# Patient Record
Sex: Male | Born: 1965
Health system: Southern US, Community
[De-identification: ages and names within clinical notes are randomized; demographics above are authoritative.]

## PROBLEM LIST (undated history)

## (undated) DIAGNOSIS — C649 Malignant neoplasm of unspecified kidney, except renal pelvis: Secondary | ICD-10-CM

## (undated) DIAGNOSIS — T884XXA Failed or difficult intubation, initial encounter: Secondary | ICD-10-CM

## (undated) DIAGNOSIS — K219 Gastro-esophageal reflux disease without esophagitis: Secondary | ICD-10-CM

## (undated) DIAGNOSIS — C099 Malignant neoplasm of tonsil, unspecified: Principal | ICD-10-CM

## (undated) DIAGNOSIS — N2889 Other specified disorders of kidney and ureter: Secondary | ICD-10-CM

## (undated) DIAGNOSIS — M1990 Unspecified osteoarthritis, unspecified site: Secondary | ICD-10-CM

## (undated) DIAGNOSIS — N189 Chronic kidney disease, unspecified: Secondary | ICD-10-CM

## (undated) DIAGNOSIS — K5730 Diverticulosis of large intestine without perforation or abscess without bleeding: Secondary | ICD-10-CM

## (undated) DIAGNOSIS — T7840XA Allergy, unspecified, initial encounter: Secondary | ICD-10-CM

## (undated) DIAGNOSIS — C801 Malignant (primary) neoplasm, unspecified: Secondary | ICD-10-CM

## (undated) DIAGNOSIS — I10 Essential (primary) hypertension: Secondary | ICD-10-CM

## (undated) DIAGNOSIS — J189 Pneumonia, unspecified organism: Secondary | ICD-10-CM

## (undated) DIAGNOSIS — E039 Hypothyroidism, unspecified: Principal | ICD-10-CM

## (undated) DIAGNOSIS — R0989 Other specified symptoms and signs involving the circulatory and respiratory systems: Secondary | ICD-10-CM

## (undated) DIAGNOSIS — Z923 Personal history of irradiation: Secondary | ICD-10-CM

## (undated) DIAGNOSIS — E860 Dehydration: Secondary | ICD-10-CM

## (undated) DIAGNOSIS — R52 Pain, unspecified: Secondary | ICD-10-CM

## (undated) HISTORY — DX: Diverticulosis of large intestine without perforation or abscess without bleeding: K57.30

## (undated) HISTORY — DX: Essential (primary) hypertension: I10

## (undated) HISTORY — DX: Dehydration: E86.0

## (undated) HISTORY — DX: Malignant neoplasm of unspecified kidney, except renal pelvis: C64.9

## (undated) HISTORY — DX: Other specified disorders of kidney and ureter: N28.89

## (undated) HISTORY — PX: DIRECT LARYNGOSCOPY: SHX5326

## (undated) HISTORY — DX: Chronic kidney disease, unspecified: N18.9

## (undated) HISTORY — DX: Gastro-esophageal reflux disease without esophagitis: K21.9

## (undated) HISTORY — DX: Personal history of irradiation: Z92.3

## (undated) HISTORY — DX: Malignant (primary) neoplasm, unspecified: C80.1

## (undated) HISTORY — DX: Other specified symptoms and signs involving the circulatory and respiratory systems: R09.89

## (undated) HISTORY — PX: WISDOM TOOTH EXTRACTION: SHX21

## (undated) HISTORY — DX: Malignant neoplasm of tonsil, unspecified: C09.9

## (undated) HISTORY — DX: Hypothyroidism, unspecified: E03.9

## (undated) HISTORY — DX: Allergy, unspecified, initial encounter: T78.40XA

---

## 2012-02-01 DIAGNOSIS — C099 Malignant neoplasm of tonsil, unspecified: Secondary | ICD-10-CM

## 2012-02-01 HISTORY — DX: Malignant neoplasm of tonsil, unspecified: C09.9

## 2012-02-06 ENCOUNTER — Ambulatory Visit
Admission: RE | Admit: 2012-02-06 | Discharge: 2012-02-06 | Disposition: A | Discharge status: Home / Self Care | Payer: BC Managed Care – PPO | Source: Ambulatory Visit | Attending: Otolaryngology | Admitting: Otolaryngology

## 2012-02-06 ENCOUNTER — Other Ambulatory Visit: Payer: Self-pay | Admitting: Otolaryngology

## 2012-02-06 DIAGNOSIS — D101 Benign neoplasm of tongue: Secondary | ICD-10-CM

## 2012-02-06 MED ORDER — IOHEXOL 300 MG/ML  SOLN
75.0000 mL | Freq: Once | INTRAMUSCULAR | Status: AC | PRN
  Administered 2012-02-06: 75 mL via INTRAVENOUS

## 2012-02-07 ENCOUNTER — Other Ambulatory Visit (HOSPITAL_COMMUNITY)
Admission: RE | Admit: 2012-02-07 | Discharge: 2012-02-07 | Disposition: A | Discharge status: Home / Self Care | Payer: BC Managed Care – PPO | Source: Ambulatory Visit | Attending: Otolaryngology | Admitting: Otolaryngology

## 2012-02-07 ENCOUNTER — Other Ambulatory Visit: Payer: Self-pay | Admitting: Otolaryngology

## 2012-02-07 DIAGNOSIS — R220 Localized swelling, mass and lump, head: Secondary | ICD-10-CM | POA: Insufficient documentation

## 2012-02-13 ENCOUNTER — Encounter (HOSPITAL_BASED_OUTPATIENT_CLINIC_OR_DEPARTMENT_OTHER): Payer: Self-pay | Admitting: *Deleted

## 2012-02-13 NOTE — Progress Notes (Signed)
No labs needed

## 2012-02-16 NOTE — H&P (Signed)
  Assessment  . 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 Discussed  Left neck mass. This could represent inflammatory lymph node, brachial cleft cyst, lymphoma or carcinoma. Recommend CT evaluation followed by fine-needle aspiration biopsy. Tongue lesion is likely a granuloma which is benign. We can talk about removing that after we find out what we need to find out about his neck mass. Reason For Visit  Bobby Gonzalez is here today at the kind request of [name] for consultation and opinion for left enlarged cervical lymph node and tongue lesion. HPI  3 month history of left neck mass, painless, slowly enlarging. He has no other symptoms. He doesn't smoke. He has a small growth on his tongue tip on the left side, which doesn't hurt unless he accidentally bites it. He denies sore throat, difficulty swallowing, voice change. One round of antibiotics did not seem to help. Allergies  Penicillins. Current Meds  No Reported Medications;; RPT. PSH  Oral Surgery Tooth Extraction. Family Hx  Family history of Coronary Artery Disease Family history of Migraine Headache Family history of Osteoporosis Family history of Prostate Cancer Family history of Stroke Syndrome. Personal Hx  Never A Smoker Never Drank Alcohol. ROS  Otolaryngeal: Snoring  and sneezing. 12 system ROS was obtained and reviewed on the Health Maintenance form dated today.  Positive responses are shown above.  If the symptom is not checked, the patient has denied it. Vital Signs   Recorded by [facility] on 06 Feb 2012 02:27 PM BP:112/60,  Height: 69 in, Weight: 199 lb, BMI: 29.4 kg/m2,  BSA Calculated: 2.06 ,  BMI Calculated: 29.38. Physical Exam  APPEARANCE: Well developed, well nourished, in no acute distress.  Normal affect, in a pleasant mood.  Oriented to time, place and person. COMMUNICATION: Normal voice   HEAD & FACE:   No scars, lesions or masses of head and face.  Sinuses nontender to palpation.  Salivary glands without mass or tenderness.  Facial strength symmetric.  No facial lesion, scars, or mass. EYES: EOMI with normal primary gaze alignment. Visual acuity grossly intact.  PERRLA EXTERNAL EAR & NOSE: No scars, lesions or masses  EAC & TYMPANIC MEMBRANE:  EAC shows no obstructing lesions or debris and tympanic membranes are normal bilaterally with good movement to insufflation. GROSS HEARING: Normal  TMJ:  Nontender  INTRANASAL EXAM: No polyps or purulence.  NASOPHARYNX: Normal, without lesions. LIPS, TEETH & GUMS: No lip lesions, normal dentition and normal gums. ORAL CAVITY/OROPHARYNX:  Oral mucosa moist without lesion or asymmetry of the palate,  tonsil or posterior pharynx. There is a small pedunculated granulomatous appearing lesion involving the left anterior tip of the tongue. LARYNX (mirror exam):  No lesions of the epiglottis, false cord or TVC's and cords move well to phonation. HYPOPHARYNX (mirror exam): No lesions, asymmetry or pooling of secretions. NECK: 45 cm left level 2/3 mass, mobile, nonfluctuant. No other masses palpable THYROID:  Normal with no masses palpable.  NEUROLOGIC:  No gross CN deficits. No nystagmus noted.

## 2012-02-17 ENCOUNTER — Encounter (HOSPITAL_BASED_OUTPATIENT_CLINIC_OR_DEPARTMENT_OTHER): Payer: Self-pay | Admitting: *Deleted

## 2012-02-17 ENCOUNTER — Encounter (HOSPITAL_BASED_OUTPATIENT_CLINIC_OR_DEPARTMENT_OTHER): Payer: Self-pay | Admitting: Anesthesiology

## 2012-02-17 ENCOUNTER — Ambulatory Visit (HOSPITAL_BASED_OUTPATIENT_CLINIC_OR_DEPARTMENT_OTHER)
Admission: RE | Admit: 2012-02-17 | Discharge: 2012-02-17 | Disposition: A | Discharge status: Home / Self Care | Payer: BC Managed Care – PPO | Source: Ambulatory Visit | Attending: Otolaryngology | Admitting: Otolaryngology

## 2012-02-17 ENCOUNTER — Ambulatory Visit (HOSPITAL_BASED_OUTPATIENT_CLINIC_OR_DEPARTMENT_OTHER): Payer: BC Managed Care – PPO | Admitting: Anesthesiology

## 2012-02-17 ENCOUNTER — Encounter (HOSPITAL_BASED_OUTPATIENT_CLINIC_OR_DEPARTMENT_OTHER)
Admission: RE | Disposition: A | Discharge status: Home / Self Care | Payer: Self-pay | Source: Ambulatory Visit | Attending: Otolaryngology

## 2012-02-17 DIAGNOSIS — K219 Gastro-esophageal reflux disease without esophagitis: Secondary | ICD-10-CM | POA: Insufficient documentation

## 2012-02-17 DIAGNOSIS — C099 Malignant neoplasm of tonsil, unspecified: Secondary | ICD-10-CM | POA: Insufficient documentation

## 2012-02-17 HISTORY — PX: OTHER SURGICAL HISTORY: SHX169

## 2012-02-17 SURGERY — LARYNGOSCOPY, WITH ESOPHAGOSCOPY
Anesthesia: General | Site: Throat | Laterality: Bilateral | Wound class: Clean Contaminated

## 2012-02-17 MED ORDER — FENTANYL CITRATE 0.05 MG/ML IJ SOLN
INTRAMUSCULAR | Status: DC | PRN
  Administered 2012-02-17 (×2): 50 ug via INTRAVENOUS

## 2012-02-17 MED ORDER — ONDANSETRON HCL 4 MG/2ML IJ SOLN
INTRAMUSCULAR | Status: DC | PRN
  Administered 2012-02-17: 4 mg via INTRAVENOUS

## 2012-02-17 MED ORDER — LACTATED RINGERS IV SOLN
INTRAVENOUS | Status: DC
  Administered 2012-02-17 (×3): via INTRAVENOUS

## 2012-02-17 MED ORDER — PROPOFOL 10 MG/ML IV EMUL
INTRAVENOUS | Status: DC | PRN
  Administered 2012-02-17: 150 mg via INTRAVENOUS
  Administered 2012-02-17: 50 mg via INTRAVENOUS

## 2012-02-17 MED ORDER — EPINEPHRINE HCL 1 MG/ML IJ SOLN
INTRAMUSCULAR | Status: DC | PRN
  Administered 2012-02-17: 1 mg

## 2012-02-17 MED ORDER — DEXAMETHASONE SODIUM PHOSPHATE 4 MG/ML IJ SOLN
INTRAMUSCULAR | Status: DC | PRN
  Administered 2012-02-17: 10 mg via INTRAVENOUS

## 2012-02-17 MED ORDER — HYDROMORPHONE HCL PF 1 MG/ML IJ SOLN
0.2500 mg | INTRAMUSCULAR | Status: DC | PRN
  Administered 2012-02-17: 0.25 mg via INTRAVENOUS
  Administered 2012-02-17: 0.5 mg via INTRAVENOUS

## 2012-02-17 MED ORDER — LIDOCAINE HCL (CARDIAC) 10 MG/ML IV SOLN
INTRAVENOUS | Status: DC | PRN
  Administered 2012-02-17: 50 mg via INTRAVENOUS

## 2012-02-17 MED ORDER — SUCCINYLCHOLINE CHLORIDE 20 MG/ML IJ SOLN
INTRAMUSCULAR | Status: DC | PRN
Start: 2012-02-17 — End: 2012-02-17
  Administered 2012-02-17: 100 mg via INTRAVENOUS

## 2012-02-17 MED ORDER — MIDAZOLAM HCL 5 MG/5ML IJ SOLN
INTRAMUSCULAR | Status: DC | PRN
  Administered 2012-02-17: 2 mg via INTRAVENOUS

## 2012-02-17 SURGICAL SUPPLY — 37 items
CANISTER SUCTION 1200CC (MISCELLANEOUS) ×3 IMPLANT
CATH ROBINSON RED A/P 12FR (CATHETERS) ×3 IMPLANT
CLOTH BEACON ORANGE TIMEOUT ST (SAFETY) ×3 IMPLANT
COAGULATOR SUCT SWTCH 10FR 6 (ELECTROSURGICAL) IMPLANT
CONT SPEC 4OZ CLIKSEAL STRL BL (MISCELLANEOUS) IMPLANT
COVER MAYO STAND STRL (DRAPES) ×3 IMPLANT
ELECT COATED BLADE 2.86 ST (ELECTRODE) ×3 IMPLANT
ELECT REM PT RETURN 9FT ADLT (ELECTROSURGICAL) ×3
ELECT REM PT RETURN 9FT PED (ELECTROSURGICAL)
ELECTRODE REM PT RETRN 9FT PED (ELECTROSURGICAL) IMPLANT
ELECTRODE REM PT RTRN 9FT ADLT (ELECTROSURGICAL) ×2 IMPLANT
GAUZE SPONGE 4X4 12PLY STRL LF (GAUZE/BANDAGES/DRESSINGS) ×6 IMPLANT
GLOVE ECLIPSE 7.5 STRL STRAW (GLOVE) ×3 IMPLANT
GLOVE SKINSENSE NS SZ7.0 (GLOVE) ×1
GLOVE SKINSENSE STRL SZ7.0 (GLOVE) ×2 IMPLANT
GOWN PREVENTION PLUS XLARGE (GOWN DISPOSABLE) ×3 IMPLANT
GOWN PREVENTION PLUS XXLARGE (GOWN DISPOSABLE) ×3 IMPLANT
GUARD TEETH (MISCELLANEOUS) ×3 IMPLANT
MARKER SKIN DUAL TIP RULER LAB (MISCELLANEOUS) ×3 IMPLANT
NEEDLE HYPO 18GX1.5 BLUNT FILL (NEEDLE) ×3 IMPLANT
NEEDLE SPNL 22GX7 QUINCKE BK (NEEDLE) IMPLANT
NS IRRIG 1000ML POUR BTL (IV SOLUTION) ×3 IMPLANT
PATTIES SURGICAL .5 X3 (DISPOSABLE) ×3 IMPLANT
PENCIL FOOT CONTROL (ELECTRODE) ×3 IMPLANT
SHEET MEDIUM DRAPE 40X70 STRL (DRAPES) ×3 IMPLANT
SOLUTION BUTLER CLEAR DIP (MISCELLANEOUS) ×3 IMPLANT
SPONGE TONSIL 1 RF SGL (DISPOSABLE) IMPLANT
SPONGE TONSIL 1.25 RF SGL STRG (GAUZE/BANDAGES/DRESSINGS) IMPLANT
SURGILUBE 2OZ TUBE FLIPTOP (MISCELLANEOUS) ×3 IMPLANT
SYR 5ML LL (SYRINGE) ×3 IMPLANT
SYR BULB 3OZ (MISCELLANEOUS) ×3 IMPLANT
SYR CONTROL 10ML LL (SYRINGE) IMPLANT
TOWEL OR 17X24 6PK STRL BLUE (TOWEL DISPOSABLE) ×3 IMPLANT
TUBE CONNECTING 20X1/4 (TUBING) ×3 IMPLANT
TUBE SALEM SUMP 12R W/ARV (TUBING) IMPLANT
TUBE SALEM SUMP 16 FR W/ARV (TUBING) ×3 IMPLANT
WATER STERILE IRR 1000ML POUR (IV SOLUTION) IMPLANT

## 2012-02-17 NOTE — Interval H&P Note (Signed)
History and Physical Interval Note:  02/17/2012 7:34 AM  Bobby Gonzalez  has presented today for surgery, with the diagnosis of left neck mass  The various methods of treatment have been discussed with the patient and family. After consideration of risks, benefits and other options for treatment, the patient has consented to  Procedure(s) (LRB): LARYNGOSCOPY AND ESOPHAGOSCOPY (Bilateral) TONSILLECTOMY (Bilateral) as a surgical intervention .  The patients' history has been reviewed, patient examined, no change in status, stable for surgery.  I have reviewed the patients' chart and labs.  Questions were answered to the patient's satisfaction.     Angeldejesus Callaham

## 2012-02-17 NOTE — Anesthesia Procedure Notes (Signed)
Procedure Name: Intubation Date/Time: 02/17/2012 10:50 AM Performed by: Gar Gibbon Pre-anesthesia Checklist: Patient identified, Emergency Drugs available, Suction available and Patient being monitored Patient Re-evaluated:Patient Re-evaluated prior to inductionOxygen Delivery Method: Circle System Utilized Preoxygenation: Pre-oxygenation with 100% oxygen Intubation Type: IV induction Ventilation: Mask ventilation without difficulty Laryngoscope Size: Miller and 3 Grade View: Grade III Tube type: Oral Tube size: 6.0 mm Number of attempts: 1 Airway Equipment and Method: stylet and oral airway Placement Confirmation: ETT inserted through vocal cords under direct vision,  positive ETCO2 and breath sounds checked- equal and bilateral Tube secured with: Tape Dental Injury: Teeth and Oropharynx as per pre-operative assessment

## 2012-02-17 NOTE — Op Note (Signed)
OPERATIVE REPORT  DATE OF SURGERY: 02/17/2012  PATIENT:  Bobby Gonzalez,  46 y.o. male  PRE-OPERATIVE DIAGNOSIS:  left neck mass  POST-OPERATIVE DIAGNOSIS:  * No post-op diagnosis entered *  PROCEDURE:  Procedure(s): LARYNGOSCOPY AND ESOPHAGOSCOPY TONSILLECTOMY  SURGEON:  Susy Frizzle, MD  ASSISTANTS: none   ANESTHESIA:   general  EBL:  10 ml  DRAINS: none   LOCAL MEDICATIONS USED:  NONE  SPECIMEN:  Source of Specimen:  Left tonsil biopsy  COUNTS:  YES  PROCEDURE DETAILS: The patient was taken to the operating room and placed on the operating table in the supine position. Following induction of general endotracheal anesthesia, the table was turned and the patient was draped in a standard fashion.  #1 direct laryngoscopy with biopsy. A maxillary tooth protector was used throughout the case. A Jako laryngoscope was used to inspect the oropharynx, hypopharynx and larynx. All areas were normal to inspection except for the left tonsil which was asymmetrically enlarged at the inferior pole. This area was also palpated and it was firm by palpation and did not involve the base of tongue. Multiple biopsies were taken and sent for frozen section analysis which was positive for squamous cell carcinoma.  #2 esophagoscopy. The cervical esophagoscope was entered into the oral cavity, used to visualize the esophageal mucosa. It was slowly withdrawn while suctioning secretions. No esophageal mucosal lesions were identified.   PATIENT DISPOSITION:  PACU - hemodynamically stable.

## 2012-02-17 NOTE — Transfer of Care (Signed)
Immediate Anesthesia Transfer of Care Note  Patient: Bobby Gonzalez  Procedure(s) Performed: Procedure(s) (LRB): LARYNGOSCOPY AND ESOPHAGOSCOPY (Bilateral)  Patient Location: PACU  Anesthesia Type: General  Level of Consciousness: sedated  Airway & Oxygen Therapy: Patient Spontanous Breathing and aerosol face mask  Post-op Assessment: Report given to PACU RN and Post -op Vital signs reviewed and stable  Post vital signs: Reviewed and stable  Complications: No apparent anesthesia complications

## 2012-02-17 NOTE — Anesthesia Postprocedure Evaluation (Signed)
  Anesthesia Post-op Note  Patient: Bobby Gonzalez  Procedure(s) Performed: Procedure(s) (LRB): LARYNGOSCOPY AND ESOPHAGOSCOPY (Bilateral)  Patient Location: PACU  Anesthesia Type: General  Level of Consciousness: sedated  Airway and Oxygen Therapy: Patient Spontanous Breathing and aerosol face mask  Post-op Pain: none  Post-op Assessment: Post-op Vital signs reviewed, Patient's Cardiovascular Status Stable, Respiratory Function Stable, Patent Airway and No signs of Nausea or vomiting  Post-op Vital Signs: Reviewed and stable  Complications: No apparent anesthesia complications

## 2012-02-17 NOTE — Anesthesia Preprocedure Evaluation (Signed)
Anesthesia Evaluation  Patient identified by MRN, date of birth, ID band Patient awake    Reviewed: Allergy & Precautions, H&P , NPO status , Patient's Chart, lab work & pertinent test results  Airway Mallampati: II TM Distance: >3 FB Neck ROM: Full    Dental No notable dental hx. (+) Teeth Intact and Dental Advisory Given   Pulmonary neg pulmonary ROS,  breath sounds clear to auscultation  Pulmonary exam normal       Cardiovascular negative cardio ROS  Rhythm:Regular Rate:Normal     Neuro/Psych negative neurological ROS  negative psych ROS   GI/Hepatic Neg liver ROS, GERD-  Medicated and Controlled,  Endo/Other  negative endocrine ROS  Renal/GU negative Renal ROS  negative genitourinary   Musculoskeletal   Abdominal   Peds  Hematology negative hematology ROS (+)   Anesthesia Other Findings   Reproductive/Obstetrics negative OB ROS                           Anesthesia Physical Anesthesia Plan  ASA: II  Anesthesia Plan: General   Post-op Pain Management:    Induction: Intravenous  Airway Management Planned: Oral ETT  Additional Equipment:   Intra-op Plan:   Post-operative Plan: Extubation in OR  Informed Consent: I have reviewed the patients History and Physical, chart, labs and discussed the procedure including the risks, benefits and alternatives for the proposed anesthesia with the patient or authorized representative who has indicated his/her understanding and acceptance.   Dental advisory given  Plan Discussed with: CRNA  Anesthesia Plan Comments:         Anesthesia Quick Evaluation  

## 2012-02-17 NOTE — Discharge Instructions (Signed)
You may use Tylenol/Motrin if needed for pain. We will arrange for further followup after consultation with the oncologist.   Post Anesthesia Home Care Instructions  Activity: Get plenty of rest for the remainder of the day. A responsible adult should stay with you for 24 hours following the procedure.  For the next 24 hours, DO NOT: -Drive a car -Advertising copywriter -Drink alcoholic beverages -Take any medication unless instructed by your physician -Make any legal decisions or sign important papers.  Meals: Start with liquid foods such as gelatin or soup. Progress to regular foods as tolerated. Avoid greasy, spicy, heavy foods. If nausea and/or vomiting occur, drink only clear liquids until the nausea and/or vomiting subsides. Call your physician if vomiting continues.  Special Instructions/Symptoms: Your throat may feel dry or sore from the anesthesia or the breathing tube placed in your throat during surgery. If this causes discomfort, gargle with warm salt water. The discomfort should disappear within 24 hours.   Call your surgeon if you experience:   1.  Fever over 101.0. 2.  Inability to urinate. 3.  Nausea and/or vomiting. 4.  Extreme swelling or bruising at the surgical site. 5.  Continued bleeding from the incision. 6.  Increased pain, redness or drainage from the incision. 7.  Problems related to your pain medication.

## 2012-02-18 ENCOUNTER — Encounter (HOSPITAL_COMMUNITY): Payer: Self-pay | Admitting: Dentistry

## 2012-02-18 ENCOUNTER — Ambulatory Visit (HOSPITAL_COMMUNITY): Payer: BC Managed Care – PPO | Admitting: Dentistry

## 2012-02-18 ENCOUNTER — Telehealth: Payer: Self-pay | Admitting: Oncology

## 2012-02-18 ENCOUNTER — Encounter: Payer: Self-pay | Admitting: Oncology

## 2012-02-18 VITALS — BP 121/73 | HR 60 | Temp 97.7°F

## 2012-02-18 DIAGNOSIS — K036 Deposits [accretions] on teeth: Secondary | ICD-10-CM

## 2012-02-18 DIAGNOSIS — K0530 Chronic periodontitis, unspecified: Secondary | ICD-10-CM

## 2012-02-18 DIAGNOSIS — M270 Developmental disorders of jaws: Secondary | ICD-10-CM

## 2012-02-18 DIAGNOSIS — M264 Malocclusion, unspecified: Secondary | ICD-10-CM

## 2012-02-18 DIAGNOSIS — M278 Other specified diseases of jaws: Secondary | ICD-10-CM

## 2012-02-18 NOTE — Telephone Encounter (Signed)
S/w pt re appt for 6/19 @ 3:45 pm.

## 2012-02-18 NOTE — Progress Notes (Signed)
DENTAL CONSULTATION  Date of Consultation:  02/18/2012 Patient Name:   Bobby Gonzalez Date of Birth:   1966/05/26 Medical Record Number: [id number]  VITALS: BP 121/73  Pulse 60  Temp 97.7 F (36.5 C)   CHIEF COMPLAINT: Patient referred for a pre-chemoradiation therapy dental evaluation.  HPI: Bobby Gonzalez is a 46 year old male recently diagnosed with squamous cell carcinoma of the left tonsil. Patient with anticipated chemoradiation therapy. Patient now seen as part of a pre-chemoradiation therapy dental protocol evaluation.  Pateint currently denies acute toothache, swellings, or abscesses. Pateint was last seen for an exam and cleaning in 2012 with Dr. Anise Salvo by patient report. This was verified to actually have been on November 03, 2007.  Patient denies having any active dental problems.  Patient Active Problem List  Diagnosis  . Tonsil cancer   PMH: 1. Squamous cell carcinoma of left tonsil-     A. Status post direct laryngoscopy and biopsy left tonsil on 02/17/2012 by Dr. Pollyann Kennedy. Pathology is positive for squamous cell carcinoma.    B. Anticipated chemotherapy with Dr. Gaylyn Rong.    C. Anticipated radiation therapy with Dr. Mitzi Hansen  PSH: Past Surgical History  Procedure Date  . Wisdom tooth extraction   . Direct laryngoscopy     S/P DL, Biopsy-Dr. Pollyann Kennedy. Pathology postive for SCCa of Left Tonsil    ALLERGIES: Allergies  Allergen Reactions  . Penicillins     unknown    MEDICATIONS: Current Outpatient Prescriptions  Medication Sig Dispense Refill  . Ascorbic Acid (VITAMIN C) 1000 MG tablet Take 1,000 mg by mouth daily.      . famotidine (PEPCID AC) 10 MG chewable tablet Chew 10 mg by mouth as needed.      Marland Kitchen HYDROcodone-acetaminophen (LORTAB) 7.5-500 MG per tablet       . Multiple Vitamins-Minerals (MULTIVITAMIN WITH MINERALS) tablet Take 1 tablet by mouth daily.        LABS: No results found for this basename: WBC, HGB, HCT, MCV, PLT   No results found for this basename:  na, k, cl, co2, glucose, bun, creatinine, calcium, gfrnonaa, gfraa   No results found for this basename: INR, PROTIME   No results found for this basename: PTT    SOCIAL HISTORY: History   Social History  . Marital Status: Married    Spouse Name: N/A    Number of Children: 2  . Years of Education: N/A   Occupational History  .     Social History Main Topics  . Smoking status: Never Smoker   . Smokeless tobacco: Never Used  . Alcohol Use: No     Never drank alocohol.  . Drug Use: No  . Sexually Active: Not on file   Other Topics Concern  . Not on file   Social History Narrative   The patient is married and has 2 daughters.Patient has never been a smoker.Patient denies use of smokeless tobacco.Patient has never drank alcohol.    FAMILY HISTORY: Family History  Problem Relation Age of Onset  . Stroke Father   . Cancer Father     Prostate     REVIEW OF SYSTEMS: Reviewed with patient and positive as above.  DENTAL HISTORY: CHIEF COMPLAINT: Patient referred for a pre-chemoradiation therapy dental evaluation.  HPI: Bobby Gonzalez is a 46 year old male recently diagnosed with squamous cell carcinoma of the left tonsil. Patient with anticipated chemoradiation therapy. Patient now seen as part of a pre-chemoradiation therapy dental protocol evaluation.  Pateint currently denies acute  toothache, swellings, or abscesses. Pateint was last seen for an exam and cleaning in 2012 with Dr. Anise Salvo by patient report. This was verified to actually have been on November 03, 2007.  Patient denies having any active dental problems.  DENTAL EXAMINATION:  GENERAL: Patient is a well-developed, well-nourished male in no acute distress.  HEAD AND NECK: Patient has a 3-4 cm left neck mass. No obvious right neck mass. Patient denies acute TMJ symptoms but recently has had a click/pop on maximum opening involving the left TMJ. Patient denies previous temporal mandibular joint dysfunction  treatment. INTRAORAL EXAM: Patient has normal saliva. Patient has bilateral mandibular lingual tori. Patient has left cheek bite trauma and linea alba at the level of the occlusion. Patient has a 4 x 4 millimeter nodule in the anterior lateral tongue left side in the area of #21/22. This is planned for removal in the near future with Dr. Pollyann Kennedy.  DENTITION: Patient is missing tooth numbers 1, 16, 17, and 32. PERIODONTAL: Patient has chronic periodontitis with plaque and calculus accumulations, selective areas of gingival recession and no obvious tooth mobility. Please see periodontal charting form. DENTAL CARIES/SUBOPTIMAL RESTORATIONS: There are no obvious dental caries noted. ENDODONTIC: Patient currently denies acute pulpitis symptoms. I do not see evidence of periapical pathology. CROWN AND BRIDGE: There are no crown restorations.  PROSTHODONTIC: None.  OCCLUSION: Patient has a stable occlusion at this time.   RADIOGRAPHIC INTERPRETATION: A panoramic x-ray and full series of dental radiographs were obtained today, Patient is missing tooth numbers 1, 16, 17, and 32. Patient with incipient bone loss. Multiple restorations are noted. There are no obvious periapical radiolucencies are dental caries.   ASSESSMENTS: 1. Chronic periodontitis with incipient bone loss  2. Accretions  3. Selective areas of gingival recession  4. No obvious tooth mobility  5. Patient is missing tooth numbers 1, 16, 17, and 32  6. Bilateral mandibular lingual tori  7. History of recent left TMJ click/pop on maximum opening. 8. 4 x 4 millimeter nodule involving the left lateral tongue in the area of 21/22 with anticipated removal in the future.  PLAN/RECOMMENDATIONS: 1. I discussed the risks, benefits, and complications of various treatment options with the patient in relationship to his medical and dental conditions. We discussed various treatment options to include no treatment, extraction of teeth in the primary  field of radiation, alveoloplasty, pre-prosthetic surgery as indicated, periodontal therapy, dental restorations, root canal therapy, crown and bridge therapy, implant therapy, and replacement of missing teeth as indicated. The patient currently wishes to proceed with  referral to an oral surgeon for second opinion concerning extraction of teeth in the primary field of radiation therapy. Patient has been scheduled for consultation appointment 02/28/2012 with Dr. Cherly Anderson.  Patient did agree to proceed with impressions today for future fabrication of fluoride trays and scatter protection devices as indicated.  A prescription for fluoride therapy will be provided in the future.    2. Discussion of findings with medical team and coordination of future medical and dental care.   Charlynne Pander, DDS

## 2012-02-18 NOTE — Patient Instructions (Addendum)

## 2012-02-18 NOTE — Patient Instructions (Addendum)
A.  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  B.  Causes:  Smoking, alcohol, HPV? C.  Treatment: - Upfront surgery.  Surgery by itself if not sufficient as the chance of recurrence is 30-50%.  Surgery must followed by radiation with possible chemotherapy. - Upfront chemoradiation:  Chance of working is about 70%. If no response to chemoradiation, then we can follow by surgery.   D.  Chemo options:  Please see attached paper.   E.  To prepare for therapy: - Referral to Dentist (Dr. Kristin Bruins) - Referral to Rad Onc (Dr. Mitzi Hansen) - PET scan to rule out distant spread of cancer; and also as baseline to monitor response after treatment. - PEG tube placement. - Nutrititionist. - Speech pathologist for swallowing exercise. - Baseline hearing test.  - Follow up with me 03/06/12 to decide on final treatment plan.

## 2012-02-19 ENCOUNTER — Encounter (HOSPITAL_COMMUNITY): Payer: Self-pay | Admitting: Dentistry

## 2012-02-19 ENCOUNTER — Ambulatory Visit (HOSPITAL_BASED_OUTPATIENT_CLINIC_OR_DEPARTMENT_OTHER): Payer: BC Managed Care – PPO | Admitting: Oncology

## 2012-02-19 ENCOUNTER — Other Ambulatory Visit (HOSPITAL_BASED_OUTPATIENT_CLINIC_OR_DEPARTMENT_OTHER): Payer: BC Managed Care – PPO | Admitting: Lab

## 2012-02-19 ENCOUNTER — Telehealth: Payer: Self-pay | Admitting: Oncology

## 2012-02-19 ENCOUNTER — Ambulatory Visit: Payer: BC Managed Care – PPO

## 2012-02-19 VITALS — BP 131/91 | HR 64 | Temp 97.7°F | Ht 70.0 in | Wt 201.3 lb

## 2012-02-19 DIAGNOSIS — C099 Malignant neoplasm of tonsil, unspecified: Secondary | ICD-10-CM

## 2012-02-19 LAB — CBC WITH DIFFERENTIAL/PLATELET
BASO%: 0.1 % (ref 0.0–2.0)
Basophils Absolute: 0 10*3/uL (ref 0.0–0.1)
EOS%: 1.6 % (ref 0.0–7.0)
Eosinophils Absolute: 0.1 10*3/uL (ref 0.0–0.5)
HCT: 43.8 % (ref 38.4–49.9)
HGB: 15.1 g/dL (ref 13.0–17.1)
LYMPH%: 26.3 % (ref 14.0–49.0)
MCH: 30.5 pg (ref 27.2–33.4)
MCHC: 34.5 g/dL (ref 32.0–36.0)
MCV: 88.4 fL (ref 79.3–98.0)
MONO#: 0.8 10*3/uL (ref 0.1–0.9)
MONO%: 10 % (ref 0.0–14.0)
NEUT#: 4.8 10*3/uL (ref 1.5–6.5)
NEUT%: 62 % (ref 39.0–75.0)
Platelets: 233 10*3/uL (ref 140–400)
RBC: 4.95 10*6/uL (ref 4.20–5.82)
RDW: 13.2 % (ref 11.0–14.6)
WBC: 7.7 10*3/uL (ref 4.0–10.3)
lymph#: 2 10*3/uL (ref 0.9–3.3)

## 2012-02-19 LAB — COMPREHENSIVE METABOLIC PANEL
ALT: 32 U/L (ref 0–53)
AST: 31 U/L (ref 0–37)
Albumin: 4 g/dL (ref 3.5–5.2)
Alkaline Phosphatase: 58 U/L (ref 39–117)
BUN: 20 mg/dL (ref 6–23)
CO2: 27 mEq/L (ref 19–32)
Calcium: 8.8 mg/dL (ref 8.4–10.5)
Chloride: 104 mEq/L (ref 96–112)
Creatinine, Ser: 0.88 mg/dL (ref 0.50–1.35)
Glucose, Bld: 113 mg/dL — ABNORMAL HIGH (ref 70–99)
Potassium: 3.3 mEq/L — ABNORMAL LOW (ref 3.5–5.3)
Sodium: 140 mEq/L (ref 135–145)
Total Bilirubin: 0.3 mg/dL (ref 0.3–1.2)
Total Protein: 6.9 g/dL (ref 6.0–8.3)

## 2012-02-19 NOTE — Telephone Encounter (Signed)
Referred by Dr. Serena Colonel Dx- Tonsil Ca

## 2012-02-20 ENCOUNTER — Telehealth: Payer: Self-pay | Admitting: Oncology

## 2012-02-20 ENCOUNTER — Encounter: Payer: Self-pay | Admitting: Oncology

## 2012-02-20 NOTE — Telephone Encounter (Signed)
called pt provided appts for pet scan on 06/26 @ 8:15am @ [facility], Dr. Mitzi Hansen for 06/26 @ 10:30am, 07/01 for peg tube ! [facility], barb Neff on 07/02, Speech Path (carl schenke) 07/05 @ 8:15am and Dr. Gaylyn Rong 07/08. moved Dr, Gaylyn Rong scheduled to 07/05 to add Speech Path appt

## 2012-02-20 NOTE — Progress Notes (Signed)
[facility]  Telephone:([phone] Fax:(336) [phone]   MEDICAL ONCOLOGY - INITIAL CONSULATION    Referral MD:  Dr. Serena Colonel, M.D.  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   HPI: Bobby Gonzalez is a 46 year-old man with history of HTN, never smoker/nor chewing tobacco, no regular EtOH use.  He was in [facility] until about 11/2011 when he developed progressive left cervical neck node.  He was given a course of antibiotic without improvement.  Therefore, a neck CT was obtained on 02/06/2012.  I personally reviewed this CT and showed the pictures to the patient and his wife today which showed a thickened left tonsil.  There was an approximately 4cm left cervical necrotic neck node at level II and small <1cm shotty cervical nodes in level III.  He was evaluated by Dr. Pollyann Kennedy with FNA on 02/07/12 with pathology case number [id number] which showed metastatic squamous cell carcinoma; not enough tissue for HPV.  He underwent on 02/17/2012 direct laryngoscopy in the OR with left tonsil biopsy. Path case # [id number] showed tonsil invasive SCC with HPV positive.   He was thus kindly referred to the [facility] for evaluation.  Mr. [name] presented to the clinic for the first time today with his wife.  He reported progressive growth of the left cervical node.  It does not cause any pain, skin rash, open wound.  He has some sore throat from the recent OR.  However, he denied dysphagia, odynophagia, hoarse voice, hearing loss, ear pain, persistent head ache, anorexia, weight loss, fatigue, other node swelling, cough, SOB, chest pain, bone pain.  He still works full time as a Research scientist (medical).  Patient denies fever, visual changes, confusion, drenching night sweats,  nausea vomiting, jaundice, chest pain, palpitation, productive cough, gum bleeding, epistaxis, hematemesis, hemoptysis, abdominal pain, abdominal swelling, early satiety, melena,  hematochezia, hematuria, skin rash, spontaneous bleeding, joint swelling, joint pain, heat or cold intolerance, bowel bladder incontinence, back pain, focal motor weakness, paresthesia, depression, suicidal or homocidal ideation, feeling hopelessness.     Past Medical History  Diagnosis Date  . 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  Marland Kitchen HTN (hypertension)   :  Past Surgical History  Procedure Date  . Wisdom tooth extraction   . Direct laryngoscopy     S/P DL, Biopsy-Dr. Pollyann Kennedy. Pathology postive for SCCa of Left Tonsil  :  Current Outpatient Prescriptions  Medication Sig Dispense Refill  . Ascorbic Acid (VITAMIN C) 1000 MG tablet Take 1,000 mg by mouth daily.      . famotidine (PEPCID AC) 10 MG chewable tablet Chew 10 mg by mouth as needed.      Marland Kitchen HYDROcodone-acetaminophen (LORTAB) 7.5-500 MG per tablet Take 1 tablet by mouth every 6 (six) hours as needed.       . Multiple Vitamins-Minerals (MULTIVITAMIN WITH MINERALS) tablet Take 1 tablet by mouth daily.         Allergies  Allergen Reactions  . Penicillins     unknown  :  Family History  Problem Relation Age of Onset  . Stroke Father   . Cancer Father     Prostate  :  History   Social History  . Marital Status: Married    Spouse Name: N/A    Number of Children: 2  . Years of Education: N/A   Occupational History  .      car saleman  Social History Main Topics  . Smoking status: Never Smoker   . Smokeless tobacco: Never Used  . Alcohol Use: No     Never drank alocohol.  . Drug Use: No  . Sexually Active: Not on file   Other Topics Concern  . Not on file   Social History Narrative   The patient is married and has 2 daughters.Patient has never been a smoker.Patient denies use of smokeless tobacco.Patient has never drank alcohol.  :  Pertinent items are noted in HPI.  Exam: ECOG 0  General:  well-nourished man in no acute distress.  Eyes:  no scleral icterus.  ENT:   There were no oropharyngeal lesions on my unaided exam.  Neck was without thyromegaly.  Lymphatics:  Positive for a 4cm left cervical level II node.  Negative for supraclavicular or axillary adenopathy.  Respiratory: lungs were clear bilaterally without wheezing or crackles.  Cardiovascular:  Regular rate and rhythm, S1/S2, without murmur, rub or gallop.  There was no pedal edema.  GI:  abdomen was soft, flat, nontender, nondistended, without organomegaly.  Muscoloskeletal:  no spinal tenderness of palpation of vertebral spine.  Skin exam was without echymosis, petichae.  Neuro exam was nonfocal.  Patient was able to get on and off exam table without assistance.  Gait was normal.  Patient was alerted and oriented.  Attention was good.   Language was appropriate.  Mood was normal without depression.  Speech was not pressured.  Thought content was not tangential.     Lab Results  Component Value Date   WBC 7.7 02/19/2012   HGB 15.1 02/19/2012   HCT 43.8 02/19/2012   PLT 233 02/19/2012   GLUCOSE 113* 02/19/2012   ALT 32 02/19/2012   AST 31 02/19/2012   NA 140 02/19/2012   K 3.3* 02/19/2012   CL 104 02/19/2012   CREATININE 0.88 02/19/2012   BUN 20 02/19/2012   CO2 27 02/19/2012    Ct Soft Tissue Neck W Contrast  02/06/2012  *RADIOLOGY REPORT*  Clinical Data: 46 year old male with Left neck mass times 3 months increasing in the most recent month.  No dysphagia or surgery.  CT NECK WITH CONTRAST  Technique:  Multidetector CT imaging of the neck was performed with intravenous contrast.  Contrast: 75mL OMNIPAQUE IOHEXOL 300 MG/ML  SOLN  Comparison:  Findings: Dental streak artifact through the oropharynx and oral tongue.  No discrete tongue mass is evident.  The sublingual space is within normal limits.  Large left level II mixed cystic and solid mass measures 37 x 30 x 43 mm and most resembles a partially cystic or necrotic nodal metastasis.  There is asymmetric blunting of the left glossotonsillar sulcus at this  level (series 2 image 41).  Both tonsillar pillars are prominent.  There is subtle asymmetry of the left tonsil on series 2 image 33.  Negative parapharyngeal spaces and retropharyngeal space.  Negative larynx, thyroid, and superior mediastinum.  In addition to the dominant left level II node, a smaller surrounding left level II and left level III nodes are asymmetric in size and number (see the 5-6 mm left level III nodes on series 2 image 52).  Left level IV nodes are also small but asymmetrically increased in size and number (image 82).  There is a small retained foreign body just below the right nasolabial fold. No acute osseous abnormality identified.  Negative lung apices.  Major vascular structures in the neck are patent although the left internal jugular vein is  severely compressed by the left neck mass.  IMPRESSION: Constellation of findings most suspicious for pharyngeal carcinoma with nodal metastasis. Imaging stage is T1 (left tonsillar asymmetry without discrete mass) N2, with additional small but suspicious nodes at the left level III and level IV nodal stations.  Original Report Authenticated By: Harley Hallmark, M.D.    Assessment and Plan:   A.  Diagnosis:  tonsilar squamous cell carcinoma with spread to left cervical node.  Prelim staging:  cT1 N2a Mx B.  Causes:  HPV.  Better prognosis than smoking related HNSCC.  Chance of cure is about 80%.  C.  Treatment options: - Upfront surgery.  Surgery by itself if not sufficient as the chance of recurrence is 30-50%.  Surgery must followed by radiation with possible chemotherapy. - Upfront chemoradiation:  Chance of working is about 70%. If no response to chemoradiation, then we can follow by salvage surgery.  He and his wife leaned toward upfront chemorad.   D.  Chemo options:    I discussed with patient side effects of cisplatin which include but not limited to nausea vomiting, alopecia, fatigue, mucositis, ototoxicity, nephrotoxicity,  abnormal electrolytes, cytopenia, risk of bleeding and infection. I discussed the side effects of combination regimen of Carboplatin andTaxol which include but not limited to mucositis, cytopenia, infection, infusion reaction, neuropathy, myalgia, arthralgia, pneumonitis. I discussed with patient side effects of Erbitux which include but not limited to skin rash, electrolytes abnormality, infusion reaction, diarrhea.  He and his wife leaned toward cisplatin.  I also discussed trial RTOG 1016 for patients with HPV positive oropharynx SCC randomizing patients to either XRT + cisplatin or XRT + cetuximab.  They would like to think about this and will call research nurse Corrie Dandy if they want to hear more about the trial.   E.  To prepare for therapy: - Referral to Dentist (Dr. Kristin Bruins) - Referral to Rad Onc (Dr. Mitzi Hansen) - PET scan to rule out distant spread of cancer; and also as baseline to monitor response after treatment.  I made this referral today.  - PEG tube placement.I made this referral today.  - Nutrititionist.I made this referral today.  - Speech pathologist for swallowing exercise.I made this referral today.  - Baseline hearing test. I made this referral today.  - Follow up with me 03/06/12 to decide on final treatment plan.   Thank you for this referral.    The length of time of the face-to-face encounter was 60 minutes. More than 50% of time was spent counseling and coordination of care.

## 2012-02-21 ENCOUNTER — Telehealth (HOSPITAL_COMMUNITY): Payer: Self-pay | Admitting: Oncology

## 2012-02-21 LAB — POCT HEMOGLOBIN-HEMACUE: Hemoglobin: 17 g/dL (ref 13.0–17.0)

## 2012-02-24 ENCOUNTER — Encounter (HOSPITAL_COMMUNITY): Payer: Self-pay | Admitting: Dentistry

## 2012-02-24 NOTE — Progress Notes (Signed)
Patient scheduled for PEG tube placement 03/02/12 @ 0930; PEG teaching set up with Norberta Keens, RN @ [facility] [phone])

## 2012-02-25 ENCOUNTER — Encounter: Payer: Self-pay | Admitting: *Deleted

## 2012-02-26 ENCOUNTER — Encounter (HOSPITAL_COMMUNITY)
Admission: RE | Admit: 2012-02-26 | Discharge: 2012-02-26 | Disposition: A | Discharge status: Home / Self Care | Payer: BC Managed Care – PPO | Source: Ambulatory Visit | Attending: Oncology | Admitting: Oncology

## 2012-02-26 ENCOUNTER — Encounter (HOSPITAL_COMMUNITY): Payer: Self-pay

## 2012-02-26 ENCOUNTER — Encounter: Payer: Self-pay | Admitting: Radiation Oncology

## 2012-02-26 ENCOUNTER — Ambulatory Visit
Admit: 2012-02-26 | Discharge: 2012-02-26 | Disposition: A | Discharge status: Home / Self Care | Payer: BC Managed Care – PPO | Attending: Radiation Oncology | Admitting: Radiation Oncology

## 2012-02-26 VITALS — BP 136/83 | HR 62 | Temp 98.0°F | Resp 20 | Wt 200.1 lb

## 2012-02-26 DIAGNOSIS — C099 Malignant neoplasm of tonsil, unspecified: Secondary | ICD-10-CM | POA: Insufficient documentation

## 2012-02-26 DIAGNOSIS — Z510 Encounter for antineoplastic radiation therapy: Secondary | ICD-10-CM | POA: Insufficient documentation

## 2012-02-26 DIAGNOSIS — I10 Essential (primary) hypertension: Secondary | ICD-10-CM | POA: Insufficient documentation

## 2012-02-26 DIAGNOSIS — K1230 Oral mucositis (ulcerative), unspecified: Secondary | ICD-10-CM | POA: Insufficient documentation

## 2012-02-26 DIAGNOSIS — R439 Unspecified disturbances of smell and taste: Secondary | ICD-10-CM | POA: Insufficient documentation

## 2012-02-26 DIAGNOSIS — Z79899 Other long term (current) drug therapy: Secondary | ICD-10-CM | POA: Insufficient documentation

## 2012-02-26 DIAGNOSIS — K219 Gastro-esophageal reflux disease without esophagitis: Secondary | ICD-10-CM | POA: Insufficient documentation

## 2012-02-26 DIAGNOSIS — N289 Disorder of kidney and ureter, unspecified: Secondary | ICD-10-CM | POA: Insufficient documentation

## 2012-02-26 DIAGNOSIS — K117 Disturbances of salivary secretion: Secondary | ICD-10-CM | POA: Insufficient documentation

## 2012-02-26 DIAGNOSIS — B977 Papillomavirus as the cause of diseases classified elsewhere: Secondary | ICD-10-CM | POA: Insufficient documentation

## 2012-02-26 DIAGNOSIS — J029 Acute pharyngitis, unspecified: Secondary | ICD-10-CM | POA: Insufficient documentation

## 2012-02-26 DIAGNOSIS — R110 Nausea: Secondary | ICD-10-CM | POA: Insufficient documentation

## 2012-02-26 DIAGNOSIS — K121 Other forms of stomatitis: Secondary | ICD-10-CM | POA: Insufficient documentation

## 2012-02-26 DIAGNOSIS — L988 Other specified disorders of the skin and subcutaneous tissue: Secondary | ICD-10-CM | POA: Insufficient documentation

## 2012-02-26 HISTORY — DX: Gastro-esophageal reflux disease without esophagitis: K21.9

## 2012-02-26 LAB — GLUCOSE, CAPILLARY: Glucose-Capillary: 108 mg/dL — ABNORMAL HIGH (ref 70–99)

## 2012-02-26 MED ORDER — FLUDEOXYGLUCOSE F - 18 (FDG) INJECTION
18.9000 | Freq: Once | INTRAVENOUS | Status: AC | PRN
  Administered 2012-02-26: 18.9 via INTRAVENOUS

## 2012-02-26 MED ORDER — LARYNGOSCOPY SOLUTION RAD-ONC
15.0000 mL | Freq: Once | TOPICAL | Status: AC
  Administered 2012-02-26: 15 mL via TOPICAL
  Filled 2012-02-26: qty 15

## 2012-02-26 NOTE — Progress Notes (Signed)
Please see the Nurse Progress Note in the MD Initial Consult Encounter for this patient. 

## 2012-02-26 NOTE — Progress Notes (Signed)
Path:02/17/12: tonsil left  Bx=Invasive squamous cell carcinoma,left neck mass,HPV positive Pet scan today pending  Still Patient alert,oriented x3,ambulatory steady gait, no c/o pain, married, 2 children   Allergies:PCN=not sure what reaction had as a child 11:08 AM

## 2012-02-26 NOTE — Progress Notes (Signed)
Dr.John Amil Amen called and gave report  From Pet Scan today of big renal mass 7.8x5.6cm mass right kidney, will give Dr.Moody printed Pet scan report and leave on his desk and e-mail as well,  1:35 PM

## 2012-02-26 NOTE — Progress Notes (Signed)
Patient was given laryngoscopy solution via nares by Jacinto Reap, per Dr.Moody, patient was coped by MD tolerated well 1:32 PM

## 2012-02-27 ENCOUNTER — Other Ambulatory Visit: Payer: Self-pay | Admitting: Radiation Oncology

## 2012-02-27 ENCOUNTER — Telehealth: Payer: Self-pay | Admitting: *Deleted

## 2012-02-27 NOTE — Progress Notes (Signed)
[facility] Radiation Oncology NEW PATIENT EVALUATION  Name: Bobby Gonzalez MRN: [id number]  Date:   02/26/2012           DOB: 1966/07/19  Status: outpatient   CC: No primary provider on file.  Serena Colonel, MD    REFERRING PHYSICIAN: Serena Colonel, MD;  Jethro Bolus, M.D., Cindra Eves, DDS, Cherly Anderson, MD   DIAGNOSIS: The encounter diagnosis was Tonsil cancer. left tonsillar squamous cell carcinoma, T1 N2AMX, HPV positive.    HISTORY OF PRESENT ILLNESS:  Bobby Gonzalez is a 46 y.o. male who is seen today for a new patient evaluation/consultation. The patient does have a recent diagnosis of left tonsillar cancer. He indicates that she noticed some swelling in his left neck and this prompted further management and workup. He was initially given a course of antibiotics but the swelling persisted. He therefore proceeded to undergo a CT scan of the neck and this was obtained on 02/06/2012. The patient was noted to have a large left level II cystic and solid mass measuring 4.3 cm in maximum dimension. There is also some asymmetric blunting of the left glossotonsillar sulcus at this level. Both tonsillar pillars were felt to be prominent with subtle asymmetry of the left tonsil. This was felt to be worrisome for a tonsillar cancer with related left-sided lymphadenopathy. The patient did undergo an FNA on 02/07/2012 and this showed squamous cell carcinoma. He then proceeded with a direct laryngoscopy through Dr. Pollyann Kennedy. The left tonsil was asymmetrically enlarged at the inferior pole and this was correlated with an area of firmness on palpation. There is no extension to the base of tongue. Biopsy was obtained and this showed invasive squamous carcinoma which was HPV positive. The patient does have a PET scan pending which he just had before coming here to clinic.  Patient denies any solid symptoms related to the oral cavity/oropharynx. He denies any ongoing pain or difficulty swallowing and  also denies any odynophagia. His only complaint at this time is enlargement of the left neck mass which has continued to enlarge to some degree.  PREVIOUS RADIATION THERAPY: No   PAST MEDICAL HISTORY:  has a past medical history of Tonsil cancer (02/2012); HTN (hypertension); Allergy; Cancer (02/17/12 bx); and GERD (gastroesophageal reflux disease).     PAST SURGICAL HISTORY:  Past Surgical History  Procedure Date  . Wisdom tooth extraction   . Direct laryngoscopy     S/P DL, Biopsy-Dr. Pollyann Kennedy. Pathology postive for SCCa of Left Tonsil     FAMILY HISTORY: family history includes Cancer in his father and Stroke in his father.   SOCIAL HISTORY:  reports that he has never smoked. He has never used smokeless tobacco. He reports that he does not drink alcohol or use illicit drugs.   ALLERGIES: Penicillins   MEDICATIONS:  Current Outpatient Prescriptions  Medication Sig Dispense Refill  . acetaminophen (TYLENOL) 500 MG tablet Take 500 mg by mouth every 6 (six) hours as needed. Took 1500 mg 02/25/12 for headache      . Ascorbic Acid (VITAMIN C) 1000 MG tablet Take 1,000 mg by mouth daily.      . famotidine (PEPCID AC) 10 MG chewable tablet Chew 10 mg by mouth as needed.      Marland Kitchen HYDROcodone-acetaminophen (LORTAB) 7.5-500 MG per tablet Take 1 tablet by mouth every 6 (six) hours as needed.       . Multiple Vitamins-Minerals (MULTIVITAMIN WITH MINERALS) tablet Take 1 tablet by mouth daily.  Current Facility-Administered Medications  Medication Dose Route Frequency Provider Last Rate Last Dose  . laryngocopy solution for Rad-Onc  15 mL Topical Once Jonna Coup, MD   15 mL at 02/26/12 1330     REVIEW OF SYSTEMS:  A comprehensive review of systems was negative.    PHYSICAL EXAM:  weight is 200 lb 1.6 oz (90.765 kg). His oral temperature is 98 F (36.7 C). His blood pressure is 136/83 and his pulse is 62. His respiration is 20.   General: Well-developed, in no acute  distress HEENT: Normocephalic, atraumatic; oral cavity clear - the patient does have some degree of a strong gag reflex and had difficulty relaxing his tongue. I cannot visually see the inferior aspect of the tonsil corresponding to the lesion. Oropharynx clear. Neck: Easily palpable nodal mass within the left neck at level II. No palpable lymphadenopathy on the right.  Cardiovascular: Regular rate and rhythm Respiratory: Clear to auscultation bilaterally GI: Soft, nontender, normal bowel sounds Extremities: No edema present Neuro: No focal deficits  Fiber-optic exam: After the use of topical anesthetic, the scope was passed to the right near. Good visualization was obtained of the oropharynx and base of tongue region and multiple pictures were taken. No extension of tumor to the left base of tongue. No lesions or worrisome areas seen within the larynx or nasopharynx.    LABORATORY DATA:  Lab Results  Component Value Date   WBC 7.7 02/19/2012   HGB 15.1 02/19/2012   HCT 43.8 02/19/2012   MCV 88.4 02/19/2012   PLT 233 02/19/2012   Lab Results  Component Value Date   NA 140 02/19/2012   K 3.3* 02/19/2012   CL 104 02/19/2012   CO2 27 02/19/2012   Lab Results  Component Value Date   ALT 32 02/19/2012   AST 31 02/19/2012   ALKPHOS 58 02/19/2012   BILITOT 0.3 02/19/2012      IMPRESSION: Pleasant 46 year old male with a recent diagnosis of left tonsillar cancer - HPV positive squamous cell carcinoma. This appears to represent at least a T1 N2a tumor and with some asymmetrically small lymph nodes on the left this may represent N2b disease.    PLAN: I believe that the patient is a good candidate for a definitive course of chemoradiotherapy. The patient has seen Dr. Gaylyn Rong regarding this as well. I discussed the rationale of this treatment with him including the expected likelihood of success in eradicating his tumor in terms of both the primary tumor and the nodal region. We also discussed the  technique that is necessary for head and neck cancer in some detail as well as the side effects and risks. I would anticipate a 6-1/2 week course of treatment. He will be treated using an IMRT technique on our tomotherapy unit. All of his questions were answered.  The patient has multiple pending appointments at this time. This includes seeing the oral surgeon and he has been seen by dentistry thus far. He also scheduled to see a nutritionist, speech pathologist, and also have a feeding tube placed. The results of the PET scan are also pending at this point. The patient is aware that if new findings are found on the PET scan then we will need to address these and discussed this with him further. Right now the patient does appear to be presenting with locally advanced left-sided tonsillar cancer. I will schedule a simulation for him after we receive more information regarding possible dental work.    I  spent 60 minutes minutes face to face with the patient and more than 50% of that time was spent in counseling and/or coordination of care.

## 2012-02-27 NOTE — Telephone Encounter (Signed)
Called [facility] to arrange an appt. For this patient, spoke with scheduler and she will speak with Dr. Mena Goes and call me back on Monday July 1 with an appt. Date and time.

## 2012-02-28 ENCOUNTER — Telehealth (HOSPITAL_COMMUNITY): Payer: Self-pay | Admitting: Oncology

## 2012-02-28 ENCOUNTER — Other Ambulatory Visit: Payer: Self-pay | Admitting: Radiology

## 2012-02-28 NOTE — Addendum Note (Signed)
Encounter addended by: Delynn Flavin, RN on: 02/28/2012  7:21 PM<BR>     Documentation filed: Charges VN

## 2012-03-02 ENCOUNTER — Ambulatory Visit (HOSPITAL_COMMUNITY)
Admission: RE | Admit: 2012-03-02 | Discharge: 2012-03-02 | Disposition: A | Discharge status: Home / Self Care | Payer: BC Managed Care – PPO | Source: Ambulatory Visit | Attending: Oncology | Admitting: Oncology

## 2012-03-02 ENCOUNTER — Telehealth: Payer: Self-pay | Admitting: *Deleted

## 2012-03-02 DIAGNOSIS — I10 Essential (primary) hypertension: Secondary | ICD-10-CM | POA: Insufficient documentation

## 2012-03-02 DIAGNOSIS — Z431 Encounter for attention to gastrostomy: Secondary | ICD-10-CM | POA: Insufficient documentation

## 2012-03-02 DIAGNOSIS — R109 Unspecified abdominal pain: Secondary | ICD-10-CM | POA: Insufficient documentation

## 2012-03-02 DIAGNOSIS — Z79899 Other long term (current) drug therapy: Secondary | ICD-10-CM | POA: Insufficient documentation

## 2012-03-02 DIAGNOSIS — C099 Malignant neoplasm of tonsil, unspecified: Secondary | ICD-10-CM

## 2012-03-02 DIAGNOSIS — K219 Gastro-esophageal reflux disease without esophagitis: Secondary | ICD-10-CM | POA: Insufficient documentation

## 2012-03-02 HISTORY — PX: GASTROSTOMY TUBE PLACEMENT: SHX655

## 2012-03-02 LAB — APTT: aPTT: 32 seconds (ref 24–37)

## 2012-03-02 LAB — CBC WITH DIFFERENTIAL/PLATELET
Basophils Absolute: 0 10*3/uL (ref 0.0–0.1)
Basophils Relative: 0 % (ref 0–1)
Eosinophils Absolute: 0.1 10*3/uL (ref 0.0–0.7)
Eosinophils Relative: 1 % (ref 0–5)
HCT: 45 % (ref 39.0–52.0)
Hemoglobin: 16 g/dL (ref 13.0–17.0)
Lymphocytes Relative: 21 % (ref 12–46)
Lymphs Abs: 1.6 10*3/uL (ref 0.7–4.0)
MCH: 30.8 pg (ref 26.0–34.0)
MCHC: 35.6 g/dL (ref 30.0–36.0)
MCV: 86.5 fL (ref 78.0–100.0)
Monocytes Absolute: 0.5 10*3/uL (ref 0.1–1.0)
Monocytes Relative: 7 % (ref 3–12)
Neutro Abs: 5.4 10*3/uL (ref 1.7–7.7)
Neutrophils Relative %: 71 % (ref 43–77)
Platelets: 233 10*3/uL (ref 150–400)
RBC: 5.2 MIL/uL (ref 4.22–5.81)
RDW: 12.2 % (ref 11.5–15.5)
WBC: 7.6 10*3/uL (ref 4.0–10.5)

## 2012-03-02 LAB — PROTIME-INR
INR: 0.94 (ref 0.00–1.49)
Prothrombin Time: 12.8 seconds (ref 11.6–15.2)

## 2012-03-02 MED ORDER — VANCOMYCIN HCL IN DEXTROSE 1-5 GM/200ML-% IV SOLN
1000.0000 mg | Freq: Once | INTRAVENOUS | Status: AC
  Administered 2012-03-02: 1000 mg via INTRAVENOUS

## 2012-03-02 MED ORDER — MIDAZOLAM HCL 10 MG/2ML IJ SOLN
INTRAMUSCULAR | Status: AC
  Filled 2012-03-02: qty 2

## 2012-03-02 MED ORDER — GLUCAGON HCL (RDNA) 1 MG IJ SOLR
INTRAMUSCULAR | Status: AC
  Administered 2012-03-02: 1 mg
  Filled 2012-03-02: qty 1

## 2012-03-02 MED ORDER — FENTANYL CITRATE 0.05 MG/ML IJ SOLN
INTRAMUSCULAR | Status: AC
  Filled 2012-03-02: qty 6

## 2012-03-02 MED ORDER — MIDAZOLAM HCL 5 MG/5ML IJ SOLN
INTRAMUSCULAR | Status: AC | PRN
  Administered 2012-03-02: 2 mg via INTRAVENOUS

## 2012-03-02 MED ORDER — VANCOMYCIN HCL IN DEXTROSE 1-5 GM/200ML-% IV SOLN
INTRAVENOUS | Status: AC
  Filled 2012-03-02: qty 200

## 2012-03-02 MED ORDER — HYDROCODONE-ACETAMINOPHEN 5-325 MG PO TABS
1.0000 | ORAL_TABLET | ORAL | Status: AC | PRN
  Administered 2012-03-02: 1 via ORAL

## 2012-03-02 MED ORDER — HYDROCODONE-ACETAMINOPHEN 5-325 MG PO TABS
ORAL_TABLET | ORAL | Status: AC
  Filled 2012-03-02: qty 1

## 2012-03-02 MED ORDER — FENTANYL CITRATE 0.05 MG/ML IJ SOLN
INTRAMUSCULAR | Status: AC | PRN
  Administered 2012-03-02 (×2): 100 ug via INTRAVENOUS

## 2012-03-02 MED ORDER — SODIUM CHLORIDE 0.9 % IV SOLN
Freq: Once | INTRAVENOUS | Status: AC
  Administered 2012-03-02: 10:00:00 via INTRAVENOUS

## 2012-03-02 NOTE — Progress Notes (Signed)
Wife discussed with Dr Grace Isaac the swelling on bilateral jawline and he noticed it was present when he arrived to radiology for procedure. Pt sleeping and voices not c/o bu awakens and said it was swollen before this procedure Abdominal binder applied and pt found it most uncomfortable so it was  D'cd. This is for patient to take home if he needed it for extra support Spoke with Susan(RN for [facility] ) and she will come by and see patient and wife while patient is in Short Stay

## 2012-03-02 NOTE — Progress Notes (Signed)
Pt up at bedside. Attempted to ambulate to BR with assist but became dizzy. Sat on bed then was able to void and ambulated to sink to wash hands.states the spasms are better now

## 2012-03-02 NOTE — H&P (Signed)
Bobby Gonzalez is an 46 y.o. male.   Chief Complaint: "I'm here for a stomach tube" HPI: Patient with history of squamous cell carcinoma of the left tonsil presents today for placement of a percutaneous gastrostomy tube prior to planned chemoradiation.  Past Medical History  Diagnosis Date  . 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  Marland Kitchen HTN (hypertension)   . Allergy     pcns  . Cancer 02/17/12 bx    left tonsil squamous cell ca,HPV positive,,spread to left cervical node  . GERD (gastroesophageal reflux disease)     Past Surgical History  Procedure Date  . Wisdom tooth extraction   . Direct laryngoscopy     S/P DL, Biopsy-Dr. Pollyann Kennedy. Pathology postive for SCCa of Left Tonsil    Family History  Problem Relation Age of Onset  . Stroke Father   . Cancer Father     Prostate   Social History:  reports that he has never smoked. He has never used smokeless tobacco. He reports that he does not drink alcohol or use illicit drugs.  Allergies:  Allergies  Allergen Reactions  . Penicillins     unknown    Current outpatient prescriptions:Multiple Vitamins-Minerals (MULTIVITAMIN WITH MINERALS) tablet, Take 1 tablet by mouth daily., Disp: , Rfl:  Current facility-administered medications:0.9 %  sodium chloride infusion, , Intravenous, Once, [employer], PA;  fentaNYL (SUBLIMAZE) 0.05 MG/ML injection, , , , ;  midazolam (VERSED) 10 MG/2ML injection, , , , ;  vancomycin (VANCOCIN) 1 GM/200ML IVPB, , , , ;  vancomycin (VANCOCIN) IVPB 1000 mg/200 mL premix, 1,000 mg, Intravenous, Once, [employer], PA   Results for orders placed during the hospital encounter of 03/02/12 (from the past 48 hour(s))  APTT     Status: Normal   Collection Time   03/02/12  8:24 AM      Component Value Range Comment   aPTT 32  24 - 37 seconds   CBC WITH DIFFERENTIAL     Status: Normal   Collection Time   03/02/12  8:24 AM      Component Value Range Comment   WBC 7.6  4.0 - 10.5 K/uL      RBC 5.20  4.22 - 5.81 MIL/uL    Hemoglobin 16.0  13.0 - 17.0 g/dL    HCT 47.8  29.5 - 62.1 %    MCV 86.5  78.0 - 100.0 fL    MCH 30.8  26.0 - 34.0 pg    MCHC 35.6  30.0 - 36.0 g/dL    RDW 30.8  65.7 - 84.6 %    Platelets 233  150 - 400 K/uL    Neutrophils Relative 71  43 - 77 %    Neutro Abs 5.4  1.7 - 7.7 K/uL    Lymphocytes Relative 21  12 - 46 %    Lymphs Abs 1.6  0.7 - 4.0 K/uL    Monocytes Relative 7  3 - 12 %    Monocytes Absolute 0.5  0.1 - 1.0 K/uL    Eosinophils Relative 1  0 - 5 %    Eosinophils Absolute 0.1  0.0 - 0.7 K/uL    Basophils Relative 0  0 - 1 %    Basophils Absolute 0.0  0.0 - 0.1 K/uL   PROTIME-INR     Status: Normal   Collection Time   03/02/12  8:24 AM      Component Value Range Comment   Prothrombin  Time 12.8  11.6 - 15.2 seconds    INR 0.94  0.00 - 1.49    No results found.  Review of Systems  Constitutional: Negative for fever and chills.  HENT:       Occ TMJ discomfort  Respiratory: Negative for cough and shortness of breath.   Cardiovascular: Negative for chest pain.  Gastrointestinal: Negative for nausea, vomiting and abdominal pain.  Musculoskeletal: Negative for back pain.  Neurological: Negative for headaches.  Endo/Heme/Allergies: Does not bruise/bleed easily.    Blood pressure 142/87, pulse 87, temperature 98.7 F (37.1 C), temperature source Oral, resp. rate 16, height 5\' 10"  (1.778 m), weight 200 lb (90.719 kg), SpO2 95.00%. Physical Exam  Constitutional: He is oriented to person, place, and time. He appears well-developed and well-nourished.  Cardiovascular: Normal rate and regular rhythm.   Respiratory: Effort normal and breath sounds normal.  GI: Soft. Bowel sounds are normal. There is no tenderness.  Musculoskeletal: Normal range of motion. He exhibits no edema.  Neurological: He is alert and oriented to person, place, and time.     Assessment/Plan Patient with squamous cell carcinoma of the left tonsil; plan is for  placement of a percutaneous gastrostomy tube today prior to planned chemoradiation. Details/risks of above d/w pt/family with their understanding and consent.  Olsen Mccutchan,D KEVIN 03/02/2012, 10:00 AM

## 2012-03-02 NOTE — Telephone Encounter (Signed)
Called patient to inform of appt. With Dr. Mena Goes on 03/27/12- arrival time - 2:00 pm, lvm for a return call.

## 2012-03-02 NOTE — Progress Notes (Signed)
Medicated pt for pain and spasms with po Vicodan  . After 15 minutes states pain is a little less but the spasms are still there. Dressing is CDI tube capped and clamped.Spoke with Dr Grace Isaac. To observe pt and evaluate for response to po pain med

## 2012-03-02 NOTE — Progress Notes (Signed)
Dr Grace Isaac here to see pt.Evaluated site and G-tube dressing changed. Wife observed how to change the dressing and verbalized understanding Pt sleeping at intervals

## 2012-03-02 NOTE — Procedures (Signed)
Successful fluoroscopic guided insertion of gastrostomy tube without immediate post procedural complicatoin.   The gastrostomy tube may be used immediately for medications.  Otherwise, place gastrostomy tube to low wall suction for 24 hrs.  Tube feeds may be initiated in 24 hours as per the primary team.   

## 2012-03-02 NOTE — Progress Notes (Signed)
Dr Grace Isaac here to see pt.

## 2012-03-02 NOTE — Progress Notes (Signed)
Dr Grace Isaac here to see wife and patient

## 2012-03-02 NOTE — Discharge Instructions (Signed)
per Dr Bobby Gonzalez. Come to Radiology tomorrow before appointment at the [facility] and ask for him .Radiology [id number]   Gastrostomy Tube, Adult A gastrostomy tube is a tube that is placed into the stomach. It is also called a "G-tube." This tube is used for:  Feeding.   Giving medication.  CLEANING THE G-TUBE SITE  Wash your hands with soap and water.   Remove the old dressing (if any). Some styles of G-tubes may need a dressing inserted between the skin and the G-tube. Other types of G-tubes do not require a dressing. Ask your caregiver if a dressing is needed.   Check the area where the tube enters the skin (insertion site) for redness, swelling, or pus-like (purulent) drainage. A small amount of clear or tan liquid drainage is normal. Check to make sure scar tissue (skin) is not growing around the insertion site. This could have a raised, bumpy appearance.   A cotton swab can be used to clean the skin around the tube:   When the G-tube is first put in, a normal saline solution or water can be used to clean the skin.   Mild soap and warm water can be used when the skin around the G-tube site has healed.   Roll the cotton swab around the G-tube insertion site to remove any drainage or crusting at the insertion site.  RESIDUALS Feeding tube residuals are the amount of liquids that are in the stomach at any given time. Residuals may be checked before giving feedings, medications, or as instructed by your caregiver.  Ask your caregiver if there are instances when you would not start tube feedings depending on the amount or type of contents withdrawn from the stomach.   Check residuals by attaching a syringe to the G-tube and pull back on the syringe plunger. Note the amount and return the residual back into the stomach.  FLUSHING THE G-TUBE  The G-tube should be periodically flushed with clean warm water to keep it from clogging.   Flush the G-tube after feedings or medications.  Draw up 30 mLs of warm water in a syringe. Connect the syringe to the G-tube and slowly push the water into the tube.   Do not push feedings, medications, or flushes rapidly. Flush the G-tube gently and slowly.   Only use syringes made for G-tubes to flush medications or feedings.   Your caregiver may want the G-tube flushed more often or with more water. If this is the case, follow your caregiver's instructions.  FEEDINGS Your caregiver will determine whether feedings are given as a bolus (a certain amount given at one time and at scheduled times) or whether feedings will be given continuously on a feeding pump.   Formulas should be given at room temperature.   If feedings are continuous, no more than 4 hours worth of feedings should be placed in the feeding bag. This helps prevent spoilage or accidental excess infusion.   Cover and place unused formula in the refrigerator.   If feedings are continuous, stop the feedings when medications or flushes are given. Be sure to restart the feedings.   Feeding bags and syringes should be replaced as instructed by your caregiver.  GIVING MEDICATION   In general, it is best if all medications are in a liquid form for G-tube administration. Liquid medications are less likely to clog the G-tube.   Mix the liquid medication with 30 mLs (or amount recommended by your caregiver) of warm water.  Draw up the medication into the syringe.   Attach the syringe to the G-tube and slowly push the mixture into the G-tube.   After giving the medication, draw up 30 mLs of warm water in the syringe and slowly flush the G-tube.   For pills or capsules, check with your caregiver first before crushing medications. Some pills are not effective if they are crushed. Some capsules are sustained release medications.   If appropriate, crush the pill or capsule and mix with 30 mLs of warm water. Using the syringe, slowly push the medication through the tube, then flush  the tube with another 30 mLs of tap water.  G-TUBE PROBLEMS G-tube was pulled out.  Cause: May have been pulled out accidentally.   Solutions: Cover the opening with clean dressing and tape. Call your caregiver right away. The G-tube should be put in as soon as possible (within 4 hours) so the G-tube opening (tract) does not close. The G-tube needs to be put in at a healthcare setting. An X-ray needs to be done to confirm placement before the G-tube can be used again.  Redness, irritation, soreness, or foul odor around the gastrostomy site.  Cause: May be caused by leakage or infection.   Solutions: Call your caregiver right away.  Large amount of leakage of fluid or mucus-like liquid present (a large amount means it soaks clothing).  Cause: Many reasons could cause the G-tube to leak.   Solutions: Call your caregiver to discuss the amount of leakage.  Skin or scar tissue appears to be growing where tube enters skin.   Cause: Tissue growth may develop around the insertion site if the G-tube is moved or pulled on excessively.   Solutions: Secure tube with tape so that excess movement does not occur. Call your caregiver.  G-tube is clogged.  Cause: Thick formula or medication.   Solutions: Try to slowly push warm water into the tube with a large syringe. Never try to push any object into the tube to unclog it. Do not force fluid into the G-tube. If you are unable to unclog the tube, call your caregiver right away.  TIPS  Head of Bed [facility]) position refers to the upright position of a person's upper body.   When giving medications or a feeding bolus, keep the HOB up as told by your caregiver. Do this during the feeding and for 1 hour after the feeding or medication administration.   If continuous feedings are being given, it is best to keep the [facility] up as told by your caregiver. When ADLs (Activities of Daily Living) are performed and the [facility] needs to be flat, be sure to turn the feeding  pump off. Restart the feeding pump when the [facility] is returned to the recommended height.   Do not pull or put tension on the tube.   To prevent fluid backflow, kink the G-tube before removing the cap or disconnecting a syringe.   Check the G-tube length every day. Measure from the insertion site to the end of the G-tube. If the length is longer than previous measurements, the tube may be coming out. Call your caregiver if you notice increasing G-tube length.   Oral care, such as brushing teeth, must be continued.   You may need to remove excess air (vent) from the G-tube. Your caregiver will tell you if this is needed.   Always call your caregiver if you have questions or problems with the G-tube.  SEEK IMMEDIATE MEDICAL CARE IF:  You have severe abdominal pain, tenderness, or abdominal bloating(distension).   You have nausea or vomiting.   You are constipated or have problems moving your bowels.   The G-tube insertion site is red, swollen, has a foul smell, or has yellow or brown drainage.   You have difficulty breathing or shortness of breath.   You have a fever.   You have a large amount of feeding tube residuals.   The G-tube is clogged and cannot be flushed.  MAKE SURE YOU:   Understand these instructions.   Will watch your condition.   Will get help right away if you are not doing well or get worse.  Document Released: 10/28/2001 Document Revised: 08/08/2011 Document Reviewed: 12/15/2007  MAY SHOWER BEGINNING   TOMORROW. PAT DRY, APPLY GAUZE DRESSING CHANGE DRESSING DAILY. WHEN THERE IS NO LONGER ANY DRAINAGE FROM SITE YOU MAY LEAVE IT UNCOVERED IF YOU DESIRE BUT CONTACT MD FOR YELLOW/BROWN OR FOUL SMELLING DRAINAGE KEEP CAPPED AND CLAMPED YOU WILL RECEIVE INSTRUCTIONS FROM CANCER CENTER DIETITIAN FOR FLUSHING YOU TUBE AT YOUR APPOINTMENT TOMORROW

## 2012-03-03 ENCOUNTER — Ambulatory Visit: Payer: BC Managed Care – PPO | Admitting: Nutrition

## 2012-03-03 ENCOUNTER — Telehealth: Payer: Self-pay | Admitting: *Deleted

## 2012-03-03 NOTE — Assessment & Plan Note (Signed)
Bobby Gonzalez is a 46 year old male patient of Dr. Gaylyn Rong and Dr. Mitzi Hansen diagnosed with HPV positive tonsil cancer to receive chemoradiation therapy.  MEDICAL HISTORY INCLUDES:  Hypertension and GERD.  MEDICATIONS INCLUDE:  Multivitamin and Lortab.  LABS:  Potassium of 3.3 and glucose of 113 on June 19th.  HEIGHT:  5 feet 10 inches. WEIGHT:  200 pounds documented July 1st. USUAL BODY WEIGHT:  200 pounds. BMI:  28.7.  NUTRITIONAL NEEDS:  2300-2500 calories, 115-130 gm protein, 2.5 L of fluid.    The patient is status post G-tube placement on July 1st.  He is quite sore from that procedure.  He verbalizes feeling overwhelmed with all the information since his diagnosis.  He states he is lactose intolerant but tolerates soy milk.  He currently has no trouble eating and has not had any side effects or weight loss at this point in time. He would like information on how to administer free water flushes as well as care of his PEG feeding tube.  NUTRITION DIAGNOSIS:  Predicted suboptimal energy intake related to diagnosis of tonsil cancer and associated treatments as evidenced by the history of presence of this condition for which research shows an increased incidence of suboptimal energy intake.  INTERVENTION:  I have educated the patient and wife on the importance of small frequent meals with adequate calories and protein to minimize weight loss to less than 5% throughout treatment.  I have briefly educated them on strategies for administering tube feedings.  I have discussed the importance of preventing and staying ahead of nausea and constipation.  I have encouraged plenty of water for adequate hydration. I have educated him on strategies for modifying the texture and temperatures of foods for ease in swallowing.  I have provided him with samples of nutrition supplements along with fact sheets and recipes for milkshakes and Ensure/Boost shakes.  I have answered their questions.  I have also referred them to  nursing for PEG tube care.  MONITORING/EVALUATION (GOALS):  Patient will tolerate oral diet to lose less than 5% of his usual body weight.  Tube feedings will be initiated if weight loss exceeds 5%.  NEXT VISIT:  I will follow up with the patient on his 1st chemotherapy as my schedule allows.  ______________________________ Zenovia Jarred, RD, LDN Clinical Nutrition Specialist BN/MEDQ  D:  03/03/2012  T:  03/03/2012  Job:  [id number]

## 2012-03-03 NOTE — Telephone Encounter (Signed)
Called patient to inform of appt. With Dr. Mena Goes on 03-27-12 at 2:15 pm, spoke with patient and he is aware of this appt.

## 2012-03-04 HISTORY — PX: MULTIPLE TOOTH EXTRACTIONS: SHX2053

## 2012-03-06 ENCOUNTER — Ambulatory Visit: Payer: Self-pay | Admitting: Oncology

## 2012-03-06 ENCOUNTER — Ambulatory Visit: Payer: BC Managed Care – PPO | Attending: Oncology

## 2012-03-06 DIAGNOSIS — R1319 Other dysphagia: Secondary | ICD-10-CM | POA: Insufficient documentation

## 2012-03-06 DIAGNOSIS — IMO0001 Reserved for inherently not codable concepts without codable children: Secondary | ICD-10-CM | POA: Insufficient documentation

## 2012-03-09 ENCOUNTER — Encounter (HOSPITAL_COMMUNITY): Payer: Self-pay | Admitting: Dentistry

## 2012-03-09 ENCOUNTER — Ambulatory Visit (HOSPITAL_BASED_OUTPATIENT_CLINIC_OR_DEPARTMENT_OTHER): Payer: BC Managed Care – PPO | Admitting: Oncology

## 2012-03-09 ENCOUNTER — Telehealth: Payer: Self-pay | Admitting: Oncology

## 2012-03-09 ENCOUNTER — Encounter: Payer: Self-pay | Admitting: Nutrition

## 2012-03-09 ENCOUNTER — Ambulatory Visit (HOSPITAL_COMMUNITY): Payer: Self-pay | Admitting: Dentistry

## 2012-03-09 ENCOUNTER — Ambulatory Visit (HOSPITAL_COMMUNITY): Payer: Medicaid - Dental | Admitting: Dentistry

## 2012-03-09 VITALS — BP 134/82 | HR 67 | Temp 98.1°F

## 2012-03-09 VITALS — BP 136/86 | HR 72 | Temp 98.1°F | Ht 70.0 in | Wt 197.5 lb

## 2012-03-09 DIAGNOSIS — Z0189 Encounter for other specified special examinations: Secondary | ICD-10-CM

## 2012-03-09 DIAGNOSIS — N289 Disorder of kidney and ureter, unspecified: Secondary | ICD-10-CM

## 2012-03-09 DIAGNOSIS — C099 Malignant neoplasm of tonsil, unspecified: Secondary | ICD-10-CM

## 2012-03-09 DIAGNOSIS — K08199 Complete loss of teeth due to other specified cause, unspecified class: Secondary | ICD-10-CM

## 2012-03-09 DIAGNOSIS — K08109 Complete loss of teeth, unspecified cause, unspecified class: Secondary | ICD-10-CM

## 2012-03-09 DIAGNOSIS — B977 Papillomavirus as the cause of diseases classified elsewhere: Secondary | ICD-10-CM

## 2012-03-09 DIAGNOSIS — Z463 Encounter for fitting and adjustment of dental prosthetic device: Secondary | ICD-10-CM

## 2012-03-09 MED ORDER — PROCHLORPERAZINE MALEATE 10 MG PO TABS: 10.0000 mg | ORAL_TABLET | Freq: Four times a day (QID) | ORAL | Status: DC | PRN

## 2012-03-09 MED ORDER — SODIUM FLUORIDE 1.1 % DT GEL: DENTAL | Status: DC

## 2012-03-09 MED ORDER — ONDANSETRON HCL 8 MG PO TABS: ORAL_TABLET | ORAL | Status: DC

## 2012-03-09 MED ORDER — LORAZEPAM 0.5 MG PO TABS: 0.5000 mg | ORAL_TABLET | Freq: Four times a day (QID) | ORAL | Status: DC | PRN

## 2012-03-09 NOTE — Telephone Encounter (Signed)
s/w pt ands he is see gsbo ent for test on 7/12 at 9;00,pt aware of all other test,appts     aom

## 2012-03-09 NOTE — Progress Notes (Signed)
03/09/2012  Patient:            Bobby Gonzalez Date of Birth:  1965/11/20 MRN:                [id number]   BP 134/82  Pulse 67  Temp 98.1 F (36.7 C)  [name] now presents for insertion of upper and lower fluoride trays and scatter protection devices. Patient had extractions with Dr. Retta Mac on 03/04/2012. Patient appears to be healing in well from the extractions. There is no sign of infection, heme, or ooze. Sutures are intact. Patient has followup evaluation with Dr. Retta Mac in the future. PROCEDURE: Appliances were tried in and adjusted as needed. [place]. Trismus device was fabricated 40 mm and using 25 sticks. Postop instructions were provided and a written and verbal format concerning the use and care of appliances. A prescription for FluoroSHIELD was provided to the patient today with when necessary refills. All questions were answered. Patient to return to clinic for periodic oral examination in approximately 2 weeks during radiation therapy. Patient to call if questions or problems arise before then.  Charlynne Pander

## 2012-03-09 NOTE — Telephone Encounter (Signed)
appts made and printed for pt ,pt aware that that i will call with audio appt.

## 2012-03-09 NOTE — Progress Notes (Signed)
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  Right renal mass with positive PET scan; pending evaluation and workup.   PAST THERAPY: biopsy only.   CURRENT THERAPY:  Due to start concurrent chemo radiation in the next few weeks.   INTERVAL HISTORY: Bobby Gonzalez 46 y.o. male returns for regular follow up with his wife.  He thinks that the left cervical neck node has slightly enlarged.  He had PEG tube placement last week which caused some discomfort.  He has decreased appetite and slight weight loss. He denies any pain in the cervical neck node swelling or the PEG tube. He still working full-time at a [employer], broadcasting/film/video.   Patient denies fever, anorexia, weight loss, fatigue, headache, visual changes, confusion, drenching night sweats, palpable lymph node swelling, mucositis, odynophagia, dysphagia, nausea vomiting, jaundice, chest pain, palpitation, shortness of breath, dyspnea on exertion, productive cough, gum bleeding, epistaxis, hematemesis, hemoptysis, abdominal pain, abdominal swelling, flank pain, early satiety, melena, hematochezia, hematuria, skin rash, spontaneous bleeding, joint swelling, joint pain, heat or cold intolerance, bowel bladder incontinence, back pain, focal motor weakness, paresthesia, depression, suicidal or homocidal ideation, feeling hopelessness.   Past Medical History  Diagnosis Date  . 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  Marland Kitchen HTN (hypertension)   . Allergy     pcns  . Cancer 02/17/12 bx    left tonsil squamous cell ca,HPV positive,,spread to left cervical node  . GERD (gastroesophageal reflux disease)     Past Surgical History  Procedure Date  . Wisdom tooth extraction   . Direct laryngoscopy     S/P DL, Biopsy-Dr. Pollyann Kennedy. Pathology postive for SCCa of Left Tonsil    Current  Outpatient Prescriptions  Medication Sig Dispense Refill  . HYDROcodone-acetaminophen (LORTAB) 7.5-500 MG per tablet Take 1 tablet by mouth every 6 (six) hours as needed.      Marland Kitchen LORazepam (ATIVAN) 0.5 MG tablet Take 1 tablet (0.5 mg total) by mouth every 6 (six) hours as needed (Nausea or vomiting).  30 tablet  0  . ondansetron (ZOFRAN) 8 MG tablet Take 1 tablet two times a day as needed for nausea or vomiting starting on the third day after chemotherapy.  30 tablet  1  . prochlorperazine (COMPAZINE) 10 MG tablet Take 1 tablet (10 mg total) by mouth every 6 (six) hours as needed (Nausea or vomiting).  30 tablet  1    ALLERGIES:  is allergic to penicillins.  REVIEW OF SYSTEMS:  The rest of the 14-point review of system was negative.   Filed Vitals:   03/09/12 1009  BP: 136/86  Pulse: 72  Temp: 98.1 F (36.7 C)   Wt Readings from Last 3 Encounters:  03/09/12 197 lb 8 oz (89.585 kg)  03/02/12 200 lb (90.719 kg)  02/26/12 200 lb 1.6 oz (90.765 kg)   ECOG Performance status: 0-1  PHYSICAL EXAMINATION:   General:  well-nourished man in no acute distress.  Eyes:  no scleral icterus.  ENT:  There were no oropharyngeal lesions on my unaided exam.  His left tonsil area appeared normal.  Neck was without thyromegaly.  Lymphatics:  Negative supraclavicular or axillary adenopathy.  Positive for a large about 4cm left level II node.  Respiratory: lungs were clear bilaterally without wheezing or crackles.  Cardiovascular:  Regular rate and rhythm, S1/S2, without murmur, rub  or gallop.  There was no pedal edema.  GI:  abdomen was soft, flat, nontender, nondistended, without organomegaly.  PEG tube was dry, clean, intact.   Muscoloskeletal:  no spinal tenderness of palpation of vertebral spine.  Skin exam was without echymosis, petichae.  Neuro exam was nonfocal.  Patient was able to get on and off exam table without assistance.  Gait was normal.  Patient was alerted and oriented.  Attention was good.    Language was appropriate.  Mood was normal without depression.  Speech was not pressured.  Thought content was not tangential.     LABORATORY/RADIOLOGY DATA:  Lab Results  Component Value Date   WBC 7.6 03/02/2012   HGB 16.0 03/02/2012   HCT 45.0 03/02/2012   PLT 233 03/02/2012   GLUCOSE 113* 02/19/2012   ALKPHOS 58 02/19/2012   ALT 32 02/19/2012   AST 31 02/19/2012   NA 140 02/19/2012   K 3.3* 02/19/2012   CL 104 02/19/2012   CREATININE 0.88 02/19/2012   BUN 20 02/19/2012   CO2 27 02/19/2012   INR 0.94 03/02/2012    Nm Pet Image Initial (pi) Skull Base To Thigh:  I personally reviewed this PET scan and showed the images to the patient and his wife.   02/26/2012  *RADIOLOGY REPORT*  Clinical Data: Initial treatment strategy for tonsillar carcinoma. Left tonsil carcinoma with local nodal disease.  Squamous cell carcinoma  NUCLEAR MEDICINE PET SKULL BASE TO THIGH  Fasting Blood Glucose:  108  Technique:  18.9 mCi F-18 FDG was injected intravenously. CT data was obtained and used for attenuation correction and anatomic localization only.  (This was not acquired as a diagnostic CT examination.) Additional exam technical data entered on technologist worksheet.  Comparison:  Next CT 02/1958 1013  Findings:  Neck: There is hypermetabolic activity within the left and right tonsillar region but more intense on the left with SUV max = 8.4. There is a large 3.5 cm left level II lymph node with intense metabolic activity (SUV max = 17.1).  No additional hypermetabolic nodes are evident in the neck.  Chest:  No hypermetabolic mediastinal or hilar nodes.  No suspicious pulmonary nodules on the CT scan.  Abdomen/Pelvis:  No abnormal hypermetabolic activity within the liver, pancreas, adrenal glands, or spleen.  No hypermetabolic lymph nodes in the abdomen or pelvis.  There is a round mass associated with the right kidney.  This is rounded within the mid pole right kidney measuring 7.8 x 5.6 cm. This mass has metabolic  activity similar to the adjacent normal renal parenchyma.  [place]:  Review of  bone windows demonstrates no aggressive osseous lesions.  IMPRESSION:  1.  Asymmetric hypermetabolic activity in the left tonsillar region consistent primary carcinoma. 2.  A single large local nodal metastasis to the left level II station. 3.  No evidence of distant metastasis. 4.  Large rounded mass in the right kidney.  Top differential is renal cell carcinoma until proven otherwise.  Findings conveyed to Dr. Joellen Jersey Nurse, Johnsie Cancel  on 02/26/2012 at 1325 hours  Original Report Authenticated By: Genevive Bi, M.D.    ASSESSMENT AND PLAN:    1.  Right renal mass; positive on PEG scan.  Evaluation by Urology for biopsy of right kidney mass.  Most likely not cancer from head/neck spreading to kidney.  Most likely either inflammation or very early stage kidney cancer.  This should not delay the treatment for his HNSCC.   2.  Left tonsil squamous  cell carcinoma: - Staging:  There was no convincing evidence of widely distant metastatic disease from his head and neck cancer. The positive renal uptake is most likely either inflammation or kidney cancer. Head neck cancer rarely metastasizes to the kidney. - Proceed with Simulation CT with Dr. Mitzi Hansen with plan to start chemoradaition in about 2 wks. [facility] (same building as Dr. Pollyann Kennedy) for baseline hearing test before chemo. - Tentatively start chemo Cisplatin on 03/23/2012.  Due to potential right kidney cancer, he is a candidate for RTOG trial. I recommend weekly cisplatin 40 mg meter square to minimize the risk of side effects given that he may have nephrectomy or partial nephrectomy in the future for possible right kidney malignancy. -To decrease mouth sore:  Take Salt/Baking soda mouth rinse 4 x/day (1 teaspoon of salt + 1 teaspoon of baking soda in 1 Liter of water). - Drink plenty of fluid the day before and the morning of chemo to decrease risk of  dehydration and expedite chemo. - Attend chemo class before starting chemo if have not done so. - I prescribed Compazine/Zofran/Ativan for nausea/vomiting prn.  - Follow up:  Due the 2nd of of chemo.    The length of time of the face-to-face encounter was 25  minutes. More than 50% of time was spent counseling and coordination of care.

## 2012-03-09 NOTE — Patient Instructions (Signed)
FLUORIDE TRAYS PATIENT INSTRUCTIONS    Obtain prescription from the pharmacy.  Don't be surprised if it needs to be ordered.   Be sure to let the pharmacy know when you are close to needing a new refill for them to have it ready for you without interruption of Fluoride use.   The best time to use your Fluoride is before bed time.   You must brush your teeth very well and floss before using the Fluoride in order to get the best use out of the Fluoride treatments.   Place 1 drop of Fluoride gel per tooth in the tray.   Place the tray on your lower teeth and/or your upper teeth.  Make sure the trays are seated all the way.  Remember, they only fit one way on your teeth.   Insert for 5 full minutes.   At the end of the 5 minutes, take the trays out.  SPIT OUT excess. .    Do NOT rinse your mouth!    Do NOT eat or drink after treatments for at least 30 minutes.  This is why the best time for your treatments is before bedtime.    Clean the inside of your Fluoride trays using COLD WATER and a toothbrush.    In order to keep your Trays from discoloring and free from odors, soak them overnight in denture cleaners such as Efferdent.  Do not use bleach or non denture products.    Store the trays in a safe dry place AWAY from any heat until your next treatment.    Bring the trays with you for your next dental check-up.  The dentist will confirm their fit.    If anything happens to your Fluoride trays, or they don't fit as well after any dental work, please let us know as soon as possible.  TRISMUS  Trismus is a condition where the jaw does not allow the mouth to open as wide as it usually does.  This can happen almost suddenly, or in other cases the process is so slow, it is hard to notice it-until it is too far along.  When the jaw joints and/or muscles have been exposed to radiation treatments, the onset of Trismus is very slow.  This is because the muscles are losing  their stretching ability over a long period of time, as long as 2 YEARS after the end of radiation.  It is therefore important to exercise these muscles and joints.  TRISMUS EXERCISES   Stack of tongue depressors measuring the same or a little less than the last documented MIO (Maximum Interincisal Opening).  Secure them with a rubber band on both ends.  Place the stack in the patient's mouth, supporting the other end.  Allow 30 seconds for muscle stretching.  Rest for a few seconds.  Repeat 3-5 times  For all radiation patients, this exercise is recommended in the mornings and evenings unless otherwise instructed.  The exercise should be done for a period of 2 YEARS after the end of radiation.  MIO should be checked routinely on recall dental visits by the general dentist or the hospital dentist.  The patient is advised to report any changes, soreness, or difficulties encountered when doing the exercises. 

## 2012-03-09 NOTE — Patient Instructions (Addendum)
1.  Proceed with Simulation CT with Dr. Mitzi Hansen with plan to start chemoradaition in about 2 wks. 2.  Evaluation by Urology for biopsy of right kidney mass.  Most likely not cancer from head/neck spreading to kidney.  Most likely either inflammation or very early kidney cancer.  3.  Please call [facility] (same building as Dr. Pollyann Kennedy) for baseline hearing test before chemo. 4.  Tentatively start chemo Cisplatin on 03/23/2012.   5.  To decrease mouth sore:  Take Salt/Baking soda mouth rinse 4 x/day (1 teaspoon of salt + 1 teaspoon of baking soda in 1 Liter of water). 6.  Drink plenty of fluid the day before and the morning of chemo to decrease risk of dehydration and expedite chemo. 7.  Attend chemo class before starting chemo if have not done so. 8.  Pick up nausea meds from your pharmacy.  9.  Return to see my NP the week after chemo start.

## 2012-03-10 DIAGNOSIS — K5730 Diverticulosis of large intestine without perforation or abscess without bleeding: Secondary | ICD-10-CM | POA: Insufficient documentation

## 2012-03-10 HISTORY — DX: Diverticulosis of large intestine without perforation or abscess without bleeding: K57.30

## 2012-03-11 NOTE — Progress Notes (Signed)
See note for Bobby Gonzalez. Dr. Kristin Bruins

## 2012-03-13 ENCOUNTER — Encounter: Payer: Self-pay | Admitting: *Deleted

## 2012-03-13 ENCOUNTER — Encounter: Payer: Self-pay | Admitting: Oncology

## 2012-03-13 DIAGNOSIS — T7840XA Allergy, unspecified, initial encounter: Secondary | ICD-10-CM | POA: Insufficient documentation

## 2012-03-13 DIAGNOSIS — N189 Chronic kidney disease, unspecified: Secondary | ICD-10-CM | POA: Insufficient documentation

## 2012-03-13 NOTE — Progress Notes (Signed)
Put Aflac form on nurse's desk °

## 2012-03-17 ENCOUNTER — Encounter: Payer: Self-pay | Admitting: Oncology

## 2012-03-17 ENCOUNTER — Encounter: Payer: Self-pay | Admitting: Radiation Oncology

## 2012-03-17 ENCOUNTER — Other Ambulatory Visit: Payer: BC Managed Care – PPO

## 2012-03-17 ENCOUNTER — Encounter: Payer: Self-pay | Admitting: Nutrition

## 2012-03-17 ENCOUNTER — Encounter: Payer: Self-pay | Admitting: *Deleted

## 2012-03-17 NOTE — Progress Notes (Signed)
Faxed [employer] form to [phone], put originals in registration desk.

## 2012-03-18 ENCOUNTER — Ambulatory Visit
Admission: RE | Admit: 2012-03-18 | Discharge: 2012-03-18 | Disposition: A | Discharge status: Home / Self Care | Payer: BC Managed Care – PPO | Source: Ambulatory Visit | Attending: Radiation Oncology | Admitting: Radiation Oncology

## 2012-03-18 VITALS — BP 131/88 | HR 62 | Temp 98.2°F | Wt 195.7 lb

## 2012-03-18 DIAGNOSIS — C099 Malignant neoplasm of tonsil, unspecified: Secondary | ICD-10-CM

## 2012-03-18 NOTE — Progress Notes (Signed)
Please see the Nurse Progress Note in the MD Initial Consult Encounter for this patient. 

## 2012-03-18 NOTE — Progress Notes (Signed)
Patient here for follow up new consult of newly diagnosed left tonsillar cancer and right renal mass.Work up complete now ready for ct simulation and concurrent chemoradiation.Feeding tube in place, site assessed.No signs of infection.Reinforced teaching of peg care and how to identify infection (redness, pain, swelling odor and yellow/green drainage).Patient and wife also informed of planning process for radiation.

## 2012-03-19 ENCOUNTER — Encounter: Payer: Self-pay | Admitting: Oncology

## 2012-03-19 ENCOUNTER — Telehealth: Payer: Self-pay | Admitting: *Deleted

## 2012-03-19 ENCOUNTER — Other Ambulatory Visit: Payer: Self-pay | Admitting: Oncology

## 2012-03-19 NOTE — Progress Notes (Signed)
Radiation Oncology         [phone] ________________________________  Name: Bobby Gonzalez MRN: [id number]  Date: 03/19/2012  DOB: 12/06/1965  Follow-Up Visit Note  CC: No primary provider on file.  Serena Colonel, MD  Diagnosis:   Tonsillar cancer  Interval Since Last Radiation:  Not applicable   Narrative:  The patient returns today for routine follow-up.  The patient has proceeded with dental work in addition to having a PEG tube placed. The patient returns for further discussion and coordination of an upcoming anticipated course of radiotherapy. He states that he has been doing relatively well. He denies any worsening difficulties within the oral cavity/oropharynx. No pain or odynophagia or dysphasia. The patient has noted an increase in the mass within the left neck with a little bit more tightness but no major change in this in terms of symptoms.                              ALLERGIES:  is allergic to penicillins.  Meds: Current Outpatient Prescriptions  Medication Sig Dispense Refill  . HYDROcodone-acetaminophen (LORTAB) 7.5-500 MG per tablet Take 1 tablet by mouth every 6 (six) hours as needed.      Marland Kitchen LORazepam (ATIVAN) 0.5 MG tablet Take 1 tablet (0.5 mg total) by mouth every 6 (six) hours as needed (Nausea or vomiting).  30 tablet  0  . ondansetron (ZOFRAN) 8 MG tablet Take 1 tablet two times a day as needed for nausea or vomiting starting on the third day after chemotherapy.  30 tablet  1  . prochlorperazine (COMPAZINE) 10 MG tablet Take 1 tablet (10 mg total) by mouth every 6 (six) hours as needed (Nausea or vomiting).  30 tablet  1  . sodium fluoride (FLUORISHIELD) 1.1 % GEL dental gel Brush and floss, and then instill one drop of gel per tooth space of fluoride tray. Place over teeth for 5 minutes. Remove. Spit out excess. Repeat nightly.  120 mL  prn    Physical Findings: The patient is in no acute distress. Patient is alert and oriented.  weight is 195 lb 11.2 oz  (88.769 kg). His temperature is 98.2 F (36.8 C). His blood pressure is 131/88 and his pulse is 62. .   The left neck mass is larger. No skin involvement. The patient has a strong gag reflex but no changes noted on exam within the oral cavity/oropharynx.  Lab Findings: Lab Results  Component Value Date   WBC 7.6 03/02/2012   HGB 16.0 03/02/2012   HCT 45.0 03/02/2012   MCV 86.5 03/02/2012   PLT 233 03/02/2012     Radiographic Findings: Ir Gastrostomy Tube Mod Sed  03/02/2012  *RADIOLOGY REPORT*  Indication:  History of squamous cell carcinoma of the tonsil; in need of gastrostomy tube placement prior to initiation of chemoradiation.  PULL TROUGH GASTOSTOMY TUBE PLACEMENT  Medications:  Versed 3 mg IV; Fentanyl 200 mcg IV; Vancomycin 1 gram IV; Antibiotics were administered within 1 hour of the procedure.  Contrast volume:  30 mL Omnipaque-300 (administered into the gastric lumen).  Sedation time: 25 minutes  Fluoroscopy time: 7.6 minutes  Complications: The patient experienced a greater than expected amount of postprocedural pain at the gastrostomy insertion site. This symptom was without associated pain with flushing of the gastrostomy lumen.  The patient remained hemodynamically stable during his 4 hour recovery and reported improved pain control prior to his eventual discharge home.  PROCEDURE/FINDINGS:  Informed written consent was obtained from the patient following explanation of the procedure, risks, benefits and alternatives.  A time out was performed prior to the initiation of the procedure. Maximal barrier sterile technique utilized including caps, mask, sterile gowns, sterile gloves, large sterile drape, hand hygiene and Betadine prep.  The left upper quadrant was sterilely prepped and draped.  An oral gastric catheter was inserted into the stomach under fluoroscopy. The existing nasogastric feeding tube was removed.  Ultrasound scanning was utilized to delineate the lateral border of the left lobe  the liver.  The left costal margin and barium opacified transverse colon were identified and avoided.  Air was injected into the stomach for insufflation and visualization under fluoroscopy.  Under sterile conditions a 17 gauge trocar needle was utilized to access the stomach percutaneously beneath the left subcostal margin after the overlying soft tissues were anesthetized with 1% Lidocaine with epinephrine.  Needle position was confirmed within the stomach with aspiration of air and injection of small amount of contrast.   A single T tack was deployed for gastropexy. Over an Amplatz guide wire, a 9-French sheath was inserted into the stomach.  A snare device was utilized to capture the oral gastric catheter.  The snare device was pulled retrograde from the stomach up the esophagus and out the oropharynx.  The 20-French pull- through gastrostomy was connected to the snare device and pulled antegrade through the oropharynx down the esophagus into the stomach and then through the percutaneous tract external to the patient.  The gastrostomy was assembled externally.  Contrast injection confirms position in the stomach.   Several spot radiographic images were obtained in various obliquities for documentation.  The patient experienced a greater than expected amount of postprocedural pain at the gastrostomy insertion site.  This symptom was without associated pain with flushing of the gastrostomy lumen.  The patient remained hemodynamically stable during his 4 hour recovery and reported improved pain control prior to his eventual discharge home.  IMPRESSION:  Successful fluoroscopic insertion of a 20-French "pull-through" gastrostomy.  Plan:  As the patient experienced a greater than expected amount of postprocedural pain at the gastrostomy insertion site (however remained hemodynamically stable during his 4 hour recovery and reported improved pain control prior to his eventual discharge home), he will return to  interventional radiology tomorrow afternoon (prior to his nutrition appointment), to ensure continued recovery.  Original Report Authenticated By: Waynard Reeds, M.D.   Nm Pet Image Initial (pi) Skull Base To Thigh  02/26/2012  *RADIOLOGY REPORT*  Clinical Data: Initial treatment strategy for tonsillar carcinoma. Left tonsil carcinoma with local nodal disease.  Squamous cell carcinoma  NUCLEAR MEDICINE PET SKULL BASE TO THIGH  Fasting Blood Glucose:  108  Technique:  18.9 mCi F-18 FDG was injected intravenously. CT data was obtained and used for attenuation correction and anatomic localization only.  (This was not acquired as a diagnostic CT examination.) Additional exam technical data entered on technologist worksheet.  Comparison:  Next CT 02/1959 1013  Findings:  Neck: There is hypermetabolic activity within the left and right tonsillar region but more intense on the left with SUV max = 8.4. There is a large 3.5 cm left level II lymph node with intense metabolic activity (SUV max = 17.1).  No additional hypermetabolic nodes are evident in the neck.  Chest:  No hypermetabolic mediastinal or hilar nodes.  No suspicious pulmonary nodules on the CT scan.  Abdomen/Pelvis:  No abnormal hypermetabolic activity within  the liver, pancreas, adrenal glands, or spleen.  No hypermetabolic lymph nodes in the abdomen or pelvis.  There is a round mass associated with the right kidney.  This is rounded within the mid pole right kidney measuring 7.8 x 5.6 cm. This mass has metabolic activity similar to the adjacent normal renal parenchyma.  [place]:  Review of  bone windows demonstrates no aggressive osseous lesions.  IMPRESSION:  1.  Asymmetric hypermetabolic activity in the left tonsillar region consistent primary carcinoma. 2.  A single large local nodal metastasis to the left level II station. 3.  No evidence of distant metastasis. 4.  Large rounded mass in the right kidney.  Top differential is renal cell carcinoma until  proven otherwise.  Findings conveyed to Dr. Joellen Jersey Nurse, Johnsie Cancel  on 02/26/2012 at 1325 hours  Original Report Authenticated By: Genevive Bi, M.D.    Impression:    Pleasant 46 year old male with tonsillar cancer who is suitable I believe to proceed with simulation for treatment planning for his upcoming course of radiotherapy.  Plan:   I would like to schedule the patient for simulation as soon as possible and our target date for beginning chemoradiotherapy will be 03/30/2012. All the patient's questions were answered and he is eager to proceed with treatment. The patient has undergone some further workup for a renal mass which does remain concerning. We discussed proceeding with his head neck cancer which I believe is enlarging and may cause local problems if we do not begin his treatment in the near future. He will therefore require further management of the renal mass subsequently.  I spent 25 minutes with the patient today, the majority of which was spent counseling the patient on the diagnosis of cancer and coordinating care.    Radene Gunning, M.D., Ph.D.

## 2012-03-19 NOTE — Telephone Encounter (Signed)
Pt called asking about status of his Cancer Insurance paperwork/ forms he dropped off last week?  I informed him there is a note from [place] in computer stating she faxed some forms to [employer] and left originals for him to pick up at registration.  Pt verbalized understanding.

## 2012-03-20 ENCOUNTER — Telehealth: Payer: Self-pay | Admitting: *Deleted

## 2012-03-20 ENCOUNTER — Ambulatory Visit
Admission: RE | Admit: 2012-03-20 | Discharge: 2012-03-20 | Disposition: A | Discharge status: Home / Self Care | Payer: BC Managed Care – PPO | Source: Ambulatory Visit | Attending: Radiation Oncology | Admitting: Radiation Oncology

## 2012-03-20 DIAGNOSIS — C099 Malignant neoplasm of tonsil, unspecified: Secondary | ICD-10-CM

## 2012-03-20 NOTE — Telephone Encounter (Signed)
Per [facility] scheduled appts. I have called and left patient message to me back. JMW

## 2012-03-20 NOTE — Progress Notes (Signed)
Met with patient to discuss RO billing.  Dx: 146.0 Tonsil, NOS (excludes Lingual tonsil T-141.6 & Pharyn   Attending Rad: Dr. Mitzi Hansen    Rad Tx: [id number] IMRT x 40

## 2012-03-23 ENCOUNTER — Other Ambulatory Visit: Payer: Self-pay | Admitting: Lab

## 2012-03-23 ENCOUNTER — Encounter: Payer: Self-pay | Admitting: Nutrition

## 2012-03-23 ENCOUNTER — Ambulatory Visit: Payer: Self-pay

## 2012-03-24 ENCOUNTER — Telehealth: Payer: Self-pay | Admitting: *Deleted

## 2012-03-24 NOTE — Telephone Encounter (Signed)
Patient returned my call. I gave him the schedule for Monday. I also notified him that once he is here, if he needs to use the restroom before treatment to let us know, so we acan measure. JMW

## 2012-03-24 NOTE — Addendum Note (Signed)
Encounter addended by: Tessa Lerner, RN on: 03/24/2012  4:24 PM<BR>     Documentation filed: Charges VN

## 2012-03-30 ENCOUNTER — Ambulatory Visit
Admission: RE | Admit: 2012-03-30 | Discharge: 2012-03-30 | Disposition: A | Discharge status: Home / Self Care | Payer: BC Managed Care – PPO | Source: Ambulatory Visit | Attending: Radiation Oncology | Admitting: Radiation Oncology

## 2012-03-30 ENCOUNTER — Telehealth: Payer: Self-pay | Admitting: Oncology

## 2012-03-30 ENCOUNTER — Ambulatory Visit (HOSPITAL_BASED_OUTPATIENT_CLINIC_OR_DEPARTMENT_OTHER): Payer: BC Managed Care – PPO

## 2012-03-30 ENCOUNTER — Other Ambulatory Visit (HOSPITAL_BASED_OUTPATIENT_CLINIC_OR_DEPARTMENT_OTHER): Payer: BC Managed Care – PPO

## 2012-03-30 ENCOUNTER — Encounter: Payer: Self-pay | Admitting: Oncology

## 2012-03-30 ENCOUNTER — Ambulatory Visit (HOSPITAL_BASED_OUTPATIENT_CLINIC_OR_DEPARTMENT_OTHER): Payer: BC Managed Care – PPO | Admitting: Oncology

## 2012-03-30 VITALS — BP 142/82 | HR 75 | Temp 97.7°F | Ht 70.0 in | Wt 199.6 lb

## 2012-03-30 DIAGNOSIS — B977 Papillomavirus as the cause of diseases classified elsewhere: Secondary | ICD-10-CM

## 2012-03-30 DIAGNOSIS — N2889 Other specified disorders of kidney and ureter: Secondary | ICD-10-CM | POA: Insufficient documentation

## 2012-03-30 DIAGNOSIS — C099 Malignant neoplasm of tonsil, unspecified: Secondary | ICD-10-CM

## 2012-03-30 DIAGNOSIS — N289 Disorder of kidney and ureter, unspecified: Secondary | ICD-10-CM

## 2012-03-30 DIAGNOSIS — Z8051 Family history of malignant neoplasm of kidney: Secondary | ICD-10-CM

## 2012-03-30 DIAGNOSIS — Z5111 Encounter for antineoplastic chemotherapy: Secondary | ICD-10-CM

## 2012-03-30 HISTORY — DX: Other specified disorders of kidney and ureter: N28.89

## 2012-03-30 LAB — COMPREHENSIVE METABOLIC PANEL
ALT: 29 U/L (ref 0–53)
AST: 19 U/L (ref 0–37)
Albumin: 4.5 g/dL (ref 3.5–5.2)
Alkaline Phosphatase: 64 U/L (ref 39–117)
BUN: 12 mg/dL (ref 6–23)
CO2: 21 mEq/L (ref 19–32)
Calcium: 9.7 mg/dL (ref 8.4–10.5)
Chloride: 105 mEq/L (ref 96–112)
Creatinine, Ser: 0.93 mg/dL (ref 0.50–1.35)
Glucose, Bld: 166 mg/dL — ABNORMAL HIGH (ref 70–99)
Potassium: 4.6 mEq/L (ref 3.5–5.3)
Sodium: 139 mEq/L (ref 135–145)
Total Bilirubin: 0.5 mg/dL (ref 0.3–1.2)
Total Protein: 7 g/dL (ref 6.0–8.3)

## 2012-03-30 LAB — CBC WITH DIFFERENTIAL/PLATELET
BASO%: 0 % (ref 0.0–2.0)
Basophils Absolute: 0 10*3/uL (ref 0.0–0.1)
EOS%: 2.1 % (ref 0.0–7.0)
Eosinophils Absolute: 0.1 10*3/uL (ref 0.0–0.5)
HCT: 45 % (ref 38.4–49.9)
HGB: 16.1 g/dL (ref 13.0–17.1)
LYMPH%: 24.8 % (ref 14.0–49.0)
MCH: 30.5 pg (ref 27.2–33.4)
MCHC: 35.8 g/dL (ref 32.0–36.0)
MCV: 85.2 fL (ref 79.3–98.0)
MONO#: 0.5 10*3/uL (ref 0.1–0.9)
MONO%: 9.6 % (ref 0.0–14.0)
NEUT#: 3.3 10*3/uL (ref 1.5–6.5)
NEUT%: 63.5 % (ref 39.0–75.0)
Platelets: 197 10*3/uL (ref 140–400)
RBC: 5.28 10*6/uL (ref 4.20–5.82)
RDW: 12.2 % (ref 11.0–14.6)
WBC: 5.1 10*3/uL (ref 4.0–10.3)
lymph#: 1.3 10*3/uL (ref 0.9–3.3)
nRBC: 0 % (ref 0–0)

## 2012-03-30 MED ORDER — ONDANSETRON 16 MG/50ML IVPB (CHCC)
16.0000 mg | Freq: Once | INTRAVENOUS | Status: AC
  Administered 2012-03-30: 16 mg via INTRAVENOUS

## 2012-03-30 MED ORDER — SODIUM CHLORIDE 0.9 % IV SOLN
40.0000 mg/m2 | Freq: Once | INTRAVENOUS | Status: AC
  Administered 2012-03-30: 84 mg via INTRAVENOUS
  Filled 2012-03-30: qty 84

## 2012-03-30 MED ORDER — DEXAMETHASONE SODIUM PHOSPHATE 4 MG/ML IJ SOLN
20.0000 mg | Freq: Once | INTRAMUSCULAR | Status: AC
  Administered 2012-03-30: 20 mg via INTRAVENOUS

## 2012-03-30 MED ORDER — POTASSIUM CHLORIDE 2 MEQ/ML IV SOLN
Freq: Once | INTRAVENOUS | Status: AC
  Administered 2012-03-30: 10:00:00 via INTRAVENOUS
  Filled 2012-03-30: qty 10

## 2012-03-30 MED ORDER — SODIUM CHLORIDE 0.9 % IV SOLN
Freq: Once | INTRAVENOUS | Status: AC
  Administered 2012-03-30: 10:00:00 via INTRAVENOUS

## 2012-03-30 NOTE — Patient Instructions (Addendum)
[facility] Discharge Instructions for Patients Receiving Chemotherapy  Today you received the following chemotherapy agents: Cisplatin  To help prevent nausea and vomiting after your treatment, we encourage you to take your nausea medication.  Take it as often as prescribed.  hours.   If you develop nausea and vomiting that is not controlled by your nausea medication, call the clinic. If it is after clinic hours your family physician or the after hours number for the clinic or go to the Emergency Department.   BELOW ARE SYMPTOMS THAT SHOULD BE REPORTED IMMEDIATELY:  *FEVER GREATER THAN 100.5 F  *CHILLS WITH OR WITHOUT FEVER  NAUSEA AND VOMITING THAT IS NOT CONTROLLED WITH YOUR NAUSEA MEDICATION  *UNUSUAL SHORTNESS OF BREATH  *UNUSUAL BRUISING OR BLEEDING  TENDERNESS IN MOUTH AND THROAT WITH OR WITHOUT PRESENCE OF ULCERS  *URINARY PROBLEMS  *BOWEL PROBLEMS  UNUSUAL RASH Items with * indicate a potential emergency and should be followed up as soon as possible.  One of the nurses will contact you 24 hours after your treatment. Please let the nurse know about any problems that you may have experienced. Feel free to call the clinic you have any questions or concerns. The clinic phone number is [phone].   I have been informed and understand all the instructions given to me. I know to contact the clinic, my physician, or go to the Emergency Department if any problems should occur. I do not have any questions at this time, but understand that I may call the clinic during office hours   should I have any questions or need assistance in obtaining follow up care.    __________________________________________  _____________  __________ Signature of Patient or Authorized Representative            Date                   Time    __________________________________________ Nurse's Signature  Cisplatin injection What is this medicine? CISPLATIN (SIS pla tin) is a  chemotherapy drug. It targets fast dividing cells, like cancer cells, and causes these cells to die. This medicine is used to treat many types of cancer like bladder, ovarian, and testicular cancers. This medicine may be used for other purposes; ask your health care provider or pharmacist if you have questions. What should I tell my health care provider before I take this medicine? They need to know if you have any of these conditions: -blood disorders -hearing problems -kidney disease -recent or ongoing radiation therapy -an unusual or allergic reaction to cisplatin, carboplatin, other chemotherapy, other medicines, foods, dyes, or preservatives -pregnant or trying to get pregnant -breast-feeding How should I use this medicine? This drug is given as an infusion into a vein. It is administered in a hospital or clinic by a specially trained health care professional. Talk to your pediatrician regarding the use of this medicine in children. Special care may be needed. Overdosage: If you think you have taken too much of this medicine contact a poison control center or emergency room at once. NOTE: This medicine is only for you. Do not share this medicine with others. What if I miss a dose? It is important not to miss a dose. Call your doctor or health care professional if you are unable to keep an appointment. What may interact with this medicine? -dofetilide -foscarnet -medicines for seizures -medicines to increase blood counts like filgrastim, pegfilgrastim, sargramostim -probenecid -pyridoxine used with altretamine -rituximab -some antibiotics like amikacin, gentamicin,  polymyxin B, streptomycin, tobramycin -sulfinpyrazone -vaccines -zalcitabine Talk to your doctor or health care professional before taking any of these medicines: -acetaminophen -aspirin -ibuprofen -ketoprofen -naproxen This list may not describe all possible interactions. Give your health care provider a  list of all the medicines, herbs, non-prescription drugs, or dietary supplements you use. Also tell them if you smoke, drink alcohol, or use illegal drugs. Some items may interact with your medicine. What should I watch for while using this medicine? Your condition will be monitored carefully while you are receiving this medicine. You will need important blood work done while you are taking this medicine. This drug may make you feel generally unwell. This is not uncommon, as chemotherapy can affect healthy cells as well as cancer cells. Report any side effects. Continue your course of treatment even though you feel ill unless your doctor tells you to stop. In some cases, you may be given additional medicines to help with side effects. Follow all directions for their use. Call your doctor or health care professional for advice if you get a fever, chills or sore throat, or other symptoms of a cold or flu. Do not treat yourself. This drug decreases your body's ability to fight infections. Try to avoid being around people who are sick. This medicine may increase your risk to bruise or bleed. Call your doctor or health care professional if you notice any unusual bleeding. Be careful brushing and flossing your teeth or using a toothpick because you may get an infection or bleed more easily. If you have any dental work done, tell your dentist you are receiving this medicine. Avoid taking products that contain aspirin, acetaminophen, ibuprofen, naproxen, or ketoprofen unless instructed by your doctor. These medicines may hide a fever. Do not become pregnant while taking this medicine. Women should inform their doctor if they wish to become pregnant or think they might be pregnant. There is a potential for serious side effects to an unborn child. Talk to your health care professional or pharmacist for more information. Do not breast-feed an infant while taking this medicine. Drink fluids as directed while you are  taking this medicine. This will help protect your kidneys. Call your doctor or health care professional if you get diarrhea. Do not treat yourself. What side effects may I notice from receiving this medicine? Side effects that you should report to your doctor or health care professional as soon as possible: -allergic reactions like skin rash, itching or hives, swelling of the face, lips, or tongue -signs of infection - fever or chills, cough, sore throat, pain or difficulty passing urine -signs of decreased platelets or bleeding - bruising, pinpoint red spots on the skin, black, tarry stools, nosebleeds -signs of decreased red blood cells - unusually weak or tired, fainting spells, lightheadedness -breathing problems -changes in hearing -gout pain -low blood counts - This drug may decrease the number of white blood cells, red blood cells and platelets. You may be at increased risk for infections and bleeding. -nausea and vomiting -pain, swelling, redness or irritation at the injection site -pain, tingling, numbness in the hands or feet -problems with balance, movement -trouble passing urine or change in the amount of urine Side effects that usually do not require medical attention (report to your doctor or health care professional if they continue or are bothersome): -changes in vision -loss of appetite -metallic taste in the mouth or changes in taste This list may not describe all possible side effects. Call your doctor for medical   advice about side effects. You may report side effects to FDA at 1-800-FDA-1088. Where should I keep my medicine? This drug is given in a hospital or clinic and will not be stored at home. NOTE: This sheet is a summary. It may not cover all possible information. If you have questions about this medicine, talk to your doctor, pharmacist, or health care provider.  2012, [employer]. (11/24/2007 2:40:54 PM)  

## 2012-03-30 NOTE — Patient Instructions (Addendum)
1.  Head/neck cancer - Start weekly Cisplatin, daily radiation today. - Will need to monitor your kidney function closely due to kidney cancer.  If your kidney function worsens, we may consider switching from Cisplatin to Carboplatin/Taxol combination.  2.  KEEP HYDRATION:  At least 2 liter (or 60 oz) of fluid everyday to maintain kidney function.  3.  Please do mouth rinses at least 3 times a day (1 teaspoon of salt + 1 teaspoon of baking soda in 1 liter of water) to decrease risk of mouth sore.  4.  Keep up your weight.  Keep doing swallow exercises.      WE WILL GET THROUGH THIS.

## 2012-03-30 NOTE — Progress Notes (Signed)
[facility]  Telephone:(336) [phone] Fax:(336) [phone]   OFFICE PROGRESS NOTE   Cc:  Bobby Blamer, MD  DIAGNOSIS: 1.  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 Right renal mass with positive PET scan; pending evaluation and workup.  2.  Right renal mass presumed to be renal cell carcinoma.   CURRENT THERAPY:  1.  Due to start concurrent chemo radiation today 03/30/2012 with daily XRT and weekly Cisplatin.  2.  Pending nephrectomy.   INTERVAL HISTORY: Bobby Gonzalez 46 y.o. male returns for regular follow up with his wife to start chemotherapy today.  He reported that his left cervical node continues to grow.  It causes some pain, but he still does not need pain med.  He has some discomfort at the PEG tube site without erythema, purulent discharge.  He has good appetite and stable weight.  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   Patient denies fever, anorexia, weight loss, fatigue, headache, visual changes, confusion, drenching night sweats, palpable lymph node swelling, mucositis, odynophagia, dysphagia, nausea vomiting, jaundice, chest pain, palpitation, shortness of breath, dyspnea on exertion, productive cough, gum bleeding, epistaxis, hematemesis, hemoptysis, abdominal pain, abdominal swelling, early satiety, melena, hematochezia, hematuria, skin rash, spontaneous bleeding, joint swelling, joint pain, heat or cold intolerance, bowel bladder incontinence, back pain, flank pain, focal motor weakness, paresthesia, depression, suicidal or homocidal ideation, feeling hopelessness.   Past Medical History  Diagnosis Date  . 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  Marland Kitchen HTN (hypertension)   . GERD (gastroesophageal reflux disease)   . Allergy     pcns  . Diverticula of colon 03/10/12    multiple rectosigmoid colonic diverticula/ ct abd/pelvis  . Chronic kidney disease     right renal mass  7.3x6.7x6.8cm renal cell ca  . Cancer 02/17/12 bx    left tonsil squamous cell ca,HPV positive,,spread to left cervical node  . Renal mass 03/30/2012    Past Surgical History  Procedure Date  . Wisdom tooth extraction   . Direct laryngoscopy     S/P DL, Biopsy-Dr. Pollyann Kennedy. Pathology postive for SCCa of Left Tonsil  . Gastrostomy tube placement 03/02/12  . Multiple tooth extractions 03/04/12    with DR. Mohorn  . Peg tube placement   . Tonsil biopsy 02/17/12    SCCa left tomnsil,HPV positive,spread to l cervical node    Current Outpatient Prescriptions  Medication Sig Dispense Refill  . HYDROcodone-acetaminophen (LORTAB) 7.5-500 MG per tablet Take 1 tablet by mouth every 6 (six) hours as needed.      Marland Kitchen LORazepam (ATIVAN) 0.5 MG tablet Take 1 tablet (0.5 mg total) by mouth every 6 (six) hours as needed (Nausea or vomiting).  30 tablet  0  . ondansetron (ZOFRAN) 8 MG tablet Take 1 tablet two times a day as needed for nausea or vomiting starting on the third day after chemotherapy.  30 tablet  1  . prochlorperazine (COMPAZINE) 10 MG tablet Take 1 tablet (10 mg total) by mouth every 6 (six) hours as needed (Nausea or vomiting).  30 tablet  1  . sodium fluoride (FLUORISHIELD) 1.1 % GEL dental gel Brush and floss, and then instill one drop of gel per tooth space of fluoride tray. Place over teeth for 5 minutes. Remove. Spit out excess. Repeat nightly.  120 mL  prn   No current facility-administered medications for this visit.   Facility-Administered Medications Ordered in  Other Visits  Medication Dose Route Frequency Provider Last Rate Last Dose  . 0.9 %  sodium chloride infusion   Intravenous Once Exie Parody, MD 20 mL/hr at 03/30/12 1015    . dextrose 5 % and 0.45% NaCl 1,000 mL with potassium chloride 20 mEq, magnesium sulfate 12 mEq infusion   Intravenous Once Exie Parody, MD 250 mL/hr at 03/30/12 1014      ALLERGIES:  is allergic to penicillins.  REVIEW OF SYSTEMS:  The rest of the 14-point  review of system was negative.   Filed Vitals:   03/30/12 0850  BP: 142/82  Pulse: 75  Temp: 97.7 F (36.5 C)   Wt Readings from Last 3 Encounters:  03/30/12 199 lb 9.6 oz (90.538 kg)  03/18/12 195 lb 11.2 oz (88.769 kg)  03/09/12 197 lb 8 oz (89.585 kg)   ECOG Performance status: 0  PHYSICAL EXAMINATION:  General: well-nourished man in no acute distress. Eyes: no scleral icterus. ENT: There were no oropharyngeal lesions on my unaided exam. His left tonsil area appeared normal. Neck was without thyromegaly. Lymphatics: Negative supraclavicular or axillary adenopathy. Positive for a large about 4cm left level II node. Respiratory: lungs were clear bilaterally without wheezing or crackles. Cardiovascular: Regular rate and rhythm, S1/S2, without murmur, rub or gallop. There was no pedal edema. GI: abdomen was soft, flat, nontender, nondistended, without organomegaly. PEG tube was dry, clean, intact. Muscoloskeletal: no spinal tenderness of palpation of vertebral spine. Skin exam was without echymosis, petichae. Neuro exam was nonfocal. Patient was able to get on and off exam table without assistance. Gait was normal. Patient was alerted and oriented. Attention was good. Language was appropriate. Mood was normal without depression. Speech was not pressured. Thought content was not tangential.    LABORATORY/RADIOLOGY DATA:  Lab Results  Component Value Date   WBC 5.1 03/30/2012   HGB 16.1 03/30/2012   HCT 45.0 03/30/2012   PLT 197 03/30/2012   GLUCOSE 113* 02/19/2012   ALKPHOS 58 02/19/2012   ALT 32 02/19/2012   AST 31 02/19/2012   NA 140 02/19/2012   K 3.3* 02/19/2012   CL 104 02/19/2012   CREATININE 0.88 02/19/2012   BUN 20 02/19/2012   CO2 27 02/19/2012   INR 0.94 03/02/2012    ASSESSMENT AND PLAN:   1. Right renal mass; positive on PEG scan. Evaluation by Urology for biopsy of right kidney mass. Most likely not cancer from head/neck spreading to kidney. Most likely either inflammation or  very early stage kidney cancer. This should not delay the treatment for his HNSCC. I have been discussing the case with Dr. Laverle Patter from Urology.  He agreed with the plan to proceed with the treatment for HNSCC and perform right nephrectomy as soon as it's safe to do so after patient is finished with treatment for HNSCC.   Mr. Devonshire and his wife agreed with this plan.  I referred him to Genetic counselor to see if it is indicated to test for VHL gene since patient's father also had renal cell carcinoma.    2. Left tonsil squamous cell carcinoma:  - Start weekly Cisplatin, daily radiation today.  I advised Mr. Hopes and his wife potential side effects of cisplatin which include but not limited to nausea vomiting, alopecia, fatigue, mucositis, ototoxicity, nephrotoxicity, abnormal electrolytes, cytopenia, risk of bleeding and infection. Given the renal mass, I will monitor his kidney function closely.  We may need to switch from standard of care weekly cisplatin to  combination of Carboplatin/Taxol if he develops renal insufficiency during treatment.  -  Hydration:  I advised hem  At least 2 liter (or 60 oz) of fluid everyday to maintain normal kidney function. -  Mucositis prevention:  I advised him on mouth rinses at least 3 times a day (1 teaspoon of salt + 1 teaspoon of baking soda in 1 liter of water) to decrease risk of mouth sore. -  Nausea meds:  He has Ativan/Compazine/Zofran prn.  -  Calorie/protein:  I advised him to keep oral intake to maintain his weight.  He does not need to use PEG tube yet but needs to flush it.   -  I advised him to perform swallowing exercise to decrease risk of esophageal stricture.    3.  Follow up:  In about 1 week.

## 2012-03-30 NOTE — Telephone Encounter (Signed)
appts made and printed for pt aom °

## 2012-03-31 ENCOUNTER — Telehealth: Payer: Self-pay | Admitting: *Deleted

## 2012-03-31 ENCOUNTER — Ambulatory Visit
Admission: RE | Admit: 2012-03-31 | Discharge: 2012-03-31 | Disposition: A | Discharge status: Home / Self Care | Payer: BC Managed Care – PPO | Source: Ambulatory Visit | Attending: Radiation Oncology | Admitting: Radiation Oncology

## 2012-03-31 NOTE — Telephone Encounter (Signed)
Message copied by Augusto Garbe on Tue Mar 31, 2012 10:59 AM ------      Message from: Adella Hare K      Created: Mon Mar 30, 2012  1:33 PM      Regarding: chemo follow up       1st time cisplatin.  [phone].  Dr. Gaylyn Rong

## 2012-03-31 NOTE — Telephone Encounter (Signed)
Message left requesting a return call for f/u.  Awaiting return call from patient. 

## 2012-04-01 ENCOUNTER — Ambulatory Visit
Admission: RE | Admit: 2012-04-01 | Discharge: 2012-04-01 | Disposition: A | Discharge status: Home / Self Care | Payer: BC Managed Care – PPO | Source: Ambulatory Visit | Attending: Radiation Oncology | Admitting: Radiation Oncology

## 2012-04-02 ENCOUNTER — Ambulatory Visit
Admission: RE | Admit: 2012-04-02 | Discharge: 2012-04-02 | Disposition: A | Discharge status: Home / Self Care | Payer: BC Managed Care – PPO | Source: Ambulatory Visit | Attending: Radiation Oncology | Admitting: Radiation Oncology

## 2012-04-03 ENCOUNTER — Other Ambulatory Visit (HOSPITAL_BASED_OUTPATIENT_CLINIC_OR_DEPARTMENT_OTHER): Payer: BC Managed Care – PPO

## 2012-04-03 ENCOUNTER — Ambulatory Visit (HOSPITAL_BASED_OUTPATIENT_CLINIC_OR_DEPARTMENT_OTHER): Payer: BC Managed Care – PPO | Admitting: Oncology

## 2012-04-03 ENCOUNTER — Ambulatory Visit
Admission: RE | Admit: 2012-04-03 | Discharge: 2012-04-03 | Disposition: A | Discharge status: Home / Self Care | Payer: BC Managed Care – PPO | Source: Ambulatory Visit | Attending: Radiation Oncology | Admitting: Radiation Oncology

## 2012-04-03 ENCOUNTER — Encounter: Payer: Self-pay | Admitting: Radiation Oncology

## 2012-04-03 VITALS — BP 144/92 | HR 81 | Resp 18 | Wt 195.4 lb

## 2012-04-03 VITALS — BP 139/92 | HR 84 | Temp 98.4°F | Resp 20 | Ht 70.0 in | Wt 195.3 lb

## 2012-04-03 DIAGNOSIS — N2889 Other specified disorders of kidney and ureter: Secondary | ICD-10-CM

## 2012-04-03 DIAGNOSIS — C099 Malignant neoplasm of tonsil, unspecified: Secondary | ICD-10-CM

## 2012-04-03 DIAGNOSIS — N289 Disorder of kidney and ureter, unspecified: Secondary | ICD-10-CM

## 2012-04-03 DIAGNOSIS — R112 Nausea with vomiting, unspecified: Secondary | ICD-10-CM

## 2012-04-03 DIAGNOSIS — B977 Papillomavirus as the cause of diseases classified elsewhere: Secondary | ICD-10-CM

## 2012-04-03 LAB — BASIC METABOLIC PANEL
BUN: 15 mg/dL (ref 6–23)
CO2: 28 mEq/L (ref 19–32)
Calcium: 9.3 mg/dL (ref 8.4–10.5)
Chloride: 100 mEq/L (ref 96–112)
Creatinine, Ser: 0.94 mg/dL (ref 0.50–1.35)
Glucose, Bld: 91 mg/dL (ref 70–99)
Potassium: 3.8 mEq/L (ref 3.5–5.3)
Sodium: 136 mEq/L (ref 135–145)

## 2012-04-03 LAB — CBC WITH DIFFERENTIAL/PLATELET
BASO%: 0.1 % (ref 0.0–2.0)
Basophils Absolute: 0 10*3/uL (ref 0.0–0.1)
EOS%: 0.8 % (ref 0.0–7.0)
Eosinophils Absolute: 0.1 10*3/uL (ref 0.0–0.5)
HCT: 46.1 % (ref 38.4–49.9)
HGB: 16.1 g/dL (ref 13.0–17.1)
LYMPH%: 13.5 % — ABNORMAL LOW (ref 14.0–49.0)
MCH: 30.9 pg (ref 27.2–33.4)
MCHC: 34.9 g/dL (ref 32.0–36.0)
MCV: 88.3 fL (ref 79.3–98.0)
MONO#: 1 10*3/uL — ABNORMAL HIGH (ref 0.1–0.9)
MONO%: 13.2 % (ref 0.0–14.0)
NEUT#: 5.4 10*3/uL (ref 1.5–6.5)
NEUT%: 72.4 % (ref 39.0–75.0)
Platelets: 223 10*3/uL (ref 140–400)
RBC: 5.22 10*6/uL (ref 4.20–5.82)
RDW: 12.6 % (ref 11.0–14.6)
WBC: 7.5 10*3/uL (ref 4.0–10.3)
lymph#: 1 10*3/uL (ref 0.9–3.3)

## 2012-04-03 NOTE — Progress Notes (Signed)
Bobby Gonzalez presents to the clinic today unaccompanied for an under treat visit with Dr. Mitzi Hansen. Bobby Gonzalez is alert and oriented to person, place, and time. No distress noted. Steady gait noted. Pleasant affect noted. Bobby Gonzalez denies pain at this time. Bobby Gonzalez reports eating without difficulty. Bobby Gonzalez reports he feels more comfortable with his feeding tube. He reports instilling boost via peg tube last night after feeling weak and nauseated related to effects of chemotherapy. Bobby Gonzalez down four pounds from 7/29 related to intense nausea and vomiting on Wednesday and Thursday of this week following chemotherapy. Bobby Gonzalez denies reflux. Bobby Gonzalez reports taking ativan and zofran for nausea. Reported all findings to Dr. Mitzi Hansen.

## 2012-04-03 NOTE — Progress Notes (Signed)
[facility]  Telephone:(336) [phone] Fax:(336) [phone]   OFFICE PROGRESS NOTE   Cc:  [name], MD  DIAGNOSIS:  1. 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 Right renal mass with positive PET scan; pending evaluation and workup.  2. Right renal mass presumed to be renal cell carcinoma.   CURRENT THERAPY:  1. Due to start concurrent chemo radiation today 03/30/2012 with daily XRT and weekly Cisplatin.  2. Pending nephrectomy.   INTERVAL HISTORY: Bobby Gonzalez 46 y.o. male returns for regular follow up with his daughter.  He had chemo last Monday.  He developed severe nausea/vomiting yesterday.  He rotated through all 3 of his nausea meds.  He still has some nausea but not vomiting today.  He has not noticed any significant shrinkage of the tumor in his left cervical neck.  He has been working until yesterday when the nausea kept from from work.  He has some fatigue today; but he is still independent of activities of daily living.  He has had some slight weight loss.   Patient denies fever, headache, visual changes, confusion, drenching night sweats, palpable lymph node swelling, mucositis, odynophagia, dysphagia, jaundice, chest pain, palpitation, shortness of breath, dyspnea on exertion, productive cough, gum bleeding, epistaxis, hematemesis, hemoptysis, abdominal pain, abdominal swelling, early satiety, melena, hematochezia, hematuria, skin rash, spontaneous bleeding, joint swelling, joint pain, heat or cold intolerance, bowel bladder incontinence, back pain, focal motor weakness, paresthesia, depression, suicidal or homicidal ideation, feeling hopelessness.   Past Medical History  Diagnosis Date  . 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  Marland Kitchen HTN (hypertension)   . GERD (gastroesophageal reflux disease)   . Allergy     pcns  . Diverticula of colon 03/10/12    multiple rectosigmoid colonic diverticula/  ct abd/pelvis  . Chronic kidney disease     right renal mass 7.3x6.7x6.8cm renal cell ca  . Cancer 02/17/12 bx    left tonsil squamous cell ca,HPV positive,,spread to left cervical node  . Renal mass 03/30/2012    Past Surgical History  Procedure Date  . Wisdom tooth extraction   . Direct laryngoscopy     S/P DL, Biopsy-Dr. Pollyann Kennedy. Pathology postive for SCCa of Left Tonsil  . Gastrostomy tube placement 03/02/12  . Multiple tooth extractions 03/04/12    with DR. Mohorn  . Peg tube placement   . Tonsil biopsy 02/17/12    SCCa left tomnsil,HPV positive,spread to l cervical node    Current Outpatient Prescriptions  Medication Sig Dispense Refill  . LORazepam (ATIVAN) 0.5 MG tablet Take 1 tablet (0.5 mg total) by mouth every 6 (six) hours as needed (Nausea or vomiting).  30 tablet  0  . ondansetron (ZOFRAN) 8 MG tablet Take 1 tablet two times a day as needed for nausea or vomiting starting on the third day after chemotherapy.  30 tablet  1  . prochlorperazine (COMPAZINE) 10 MG tablet Take 1 tablet (10 mg total) by mouth every 6 (six) hours as needed (Nausea or vomiting).  30 tablet  1  . sodium fluoride (FLUORISHIELD) 1.1 % GEL dental gel Brush and floss, and then instill one drop of gel per tooth space of fluoride tray. Place over teeth for 5 minutes. Remove. Spit out excess. Repeat nightly.  120 mL  prn  . HYDROcodone-acetaminophen (LORTAB) 7.5-500 MG per tablet Take 1 tablet by mouth every 6 (six) hours as needed.  ALLERGIES:  is allergic to penicillins.  REVIEW OF SYSTEMS:  The rest of the 14-point review of system was negative.   Filed Vitals:   04/03/12 1518  BP: 139/92  Pulse: 84  Temp: 98.4 F (36.9 C)  Resp: 20   Wt Readings from Last 3 Encounters:  04/03/12 195 lb 6.4 oz (88.633 kg)  04/03/12 195 lb 4.8 oz (88.587 kg)  03/30/12 199 lb 9.6 oz (90.538 kg)   ECOG Performance status: 0-1  PHYSICAL EXAMINATION:   General: well-nourished man in no acute distress.  Eyes: no scleral icterus. ENT: There were no oropharyngeal lesions on my unaided exam. His left tonsil area appeared normal. Neck was without thyromegaly. Lymphatics: Negative supraclavicular or axillary adenopathy. Positive for a large about 4cm left level II node. Respiratory: lungs were clear bilaterally without wheezing or crackles. Cardiovascular: Regular rate and rhythm, S1/S2, without murmur, rub or gallop. There was no pedal edema. GI: abdomen was soft, flat, nontender, nondistended, without organomegaly. PEG tube was dry, clean, intact. Muscoloskeletal: no spinal tenderness of palpation of vertebral spine. Skin exam was without echymosis, petichae. Neuro exam was nonfocal. Patient was able to get on and off exam table without assistance. Gait was normal. Patient was alerted and oriented. Attention was good. Language was appropriate. Mood was normal without depression. Speech was not pressured. Thought content was not tangential.    LABORATORY/RADIOLOGY DATA:  Lab Results  Component Value Date   WBC 7.5 04/03/2012   HGB 16.1 04/03/2012   HCT 46.1 04/03/2012   PLT 223 04/03/2012   GLUCOSE 91 04/03/2012   ALKPHOS 64 03/30/2012   ALT 29 03/30/2012   AST 19 03/30/2012   NA 136 04/03/2012   K 3.8 04/03/2012   CL 100 04/03/2012   CREATININE 0.94 04/03/2012   BUN 15 04/03/2012   CO2 28 04/03/2012   INR 0.94 03/02/2012    ASSESSMENT AND PLAN:   1. Right renal mass; positive on PEG scan. To be addressed post treatment for head/neck cancer.   2. Left tonsil squamous cell carcinoma:  - Started weekly cisplatin/XRT this week. - He has grade 2 nausea/vomiting.  I advised him to rotate through Ativan/Compazine/Zofran more regularly with the 2nd week.  If he still has problem, I may change his IV premed to Emend/Aloxi.  - Hydration: I advised him to continue at least 2 liter (or 60 oz) of fluid everyday to maintain normal kidney function.  - Mucositis prevention: I advised him to continue with mouth rinses at least 3  times a day (1 teaspoon of salt + 1 teaspoon of baking soda in 1 liter of water) to decrease risk of mouth sore.  - Calorie/protein: I advised him to keep oral intake to maintain his weight. He does not need to use PEG tube yet but needs to continue flushing it.  - I advised him to continue with swallowing exercise to decrease risk of esophageal stricture.   3. Follow up: In 1 week.    The length of time of the face-to-face encounter was 15  minutes. More than 50% of time was spent counseling and coordination of care.

## 2012-04-03 NOTE — Progress Notes (Signed)
   Department of Radiation Oncology  Phone:  [phone] Fax:        [phone]  Weekly Treatment Note    Name: Bobby Gonzalez Date: 04/03/2012 MRN: [id number] DOB: [birth year]   Current dose: 10.6 Gy  Current fraction: 5   MEDICATIONS: Current Outpatient Prescriptions  Medication Sig Dispense Refill  . LORazepam (ATIVAN) 0.5 MG tablet Take 1 tablet (0.5 mg total) by mouth every 6 (six) hours as needed (Nausea or vomiting).  30 tablet  0  . ondansetron (ZOFRAN) 8 MG tablet Take 1 tablet two times a day as needed for nausea or vomiting starting on the third day after chemotherapy.  30 tablet  1  . sodium fluoride (FLUORISHIELD) 1.1 % GEL dental gel Brush and floss, and then instill one drop of gel per tooth space of fluoride tray. Place over teeth for 5 minutes. Remove. Spit out excess. Repeat nightly.  120 mL  prn  . HYDROcodone-acetaminophen (LORTAB) 7.5-500 MG per tablet Take 1 tablet by mouth every 6 (six) hours as needed.      . prochlorperazine (COMPAZINE) 10 MG tablet Take 1 tablet (10 mg total) by mouth every 6 (six) hours as needed (Nausea or vomiting).  30 tablet  1     ALLERGIES: Penicillins   LABORATORY DATA:  Lab Results  Component Value Date   WBC 7.5 04/03/2012   HGB 16.1 04/03/2012   HCT 46.1 04/03/2012   MCV 88.3 04/03/2012   PLT 223 04/03/2012   Lab Results  Component Value Date   NA 136 04/03/2012   K 3.8 04/03/2012   CL 100 04/03/2012   CO2 28 04/03/2012   Lab Results  Component Value Date   ALT 29 03/30/2012   AST 19 03/30/2012   ALKPHOS 64 03/30/2012   BILITOT 0.5 03/30/2012     NARRATIVE: Bobby Gonzalez was seen today for weekly treatment management. The chart was checked and the patient's films were reviewed. The patient is doing better today. He has had nausea over the last couple of days. He is now taking Zofran more regularly and he states that he was not exactly taking it as he feels was intended. He also has Ativan and Compazine which she is taking in  addition to the Zofran as necessary. He does feel better today.  PHYSICAL EXAMINATION: weight is 195 lb 6.4 oz (88.633 kg). His blood pressure is 144/92 and his pulse is 81. His respiration is 18.      no significant skin change at this point.  ASSESSMENT: The patient is doing satisfactorily with treatment.  PLAN: We will continue with the patient's radiation treatment as planned.

## 2012-04-03 NOTE — Patient Instructions (Addendum)
Take Anti Nausea Meds as Directed: 1. Zofran (ondansetron) 8 mg every 12 hrs as needed. Start 1st night of chemotherapy and continue every 12 hrs for at least 3 days post chemo. This is more preventative for chemotherapy induced nausea and also non sedating.  2. For unrelieved nausea; Add Compazine (prochloraperzine) 10 mg every 6 hrs as needed. Take every 6 hrs if any nausea. This is sedating.  3. For continued unrelieved nausea; Add the Ativan (lorazepam) 0.5 mg every 6 hrs as needed (can alternate with the compazine). Can melt under the tongue and this is also sedating.   For Nausea and or Vomiting unrelieved by all 3 nausea meds, Call us.  

## 2012-04-06 ENCOUNTER — Ambulatory Visit: Payer: BC Managed Care – PPO | Admitting: Nutrition

## 2012-04-06 ENCOUNTER — Ambulatory Visit
Admission: RE | Admit: 2012-04-06 | Discharge: 2012-04-06 | Disposition: A | Discharge status: Home / Self Care | Payer: BC Managed Care – PPO | Source: Ambulatory Visit | Attending: Radiation Oncology | Admitting: Radiation Oncology

## 2012-04-06 ENCOUNTER — Other Ambulatory Visit (HOSPITAL_BASED_OUTPATIENT_CLINIC_OR_DEPARTMENT_OTHER): Payer: BC Managed Care – PPO | Admitting: Lab

## 2012-04-06 ENCOUNTER — Ambulatory Visit (HOSPITAL_BASED_OUTPATIENT_CLINIC_OR_DEPARTMENT_OTHER): Payer: BC Managed Care – PPO

## 2012-04-06 VITALS — BP 134/89 | HR 91 | Temp 98.2°F | Resp 20

## 2012-04-06 DIAGNOSIS — C099 Malignant neoplasm of tonsil, unspecified: Secondary | ICD-10-CM

## 2012-04-06 DIAGNOSIS — Z5111 Encounter for antineoplastic chemotherapy: Secondary | ICD-10-CM

## 2012-04-06 LAB — COMPREHENSIVE METABOLIC PANEL
ALT: 25 U/L (ref 0–53)
AST: 15 U/L (ref 0–37)
Albumin: 4.2 g/dL (ref 3.5–5.2)
Alkaline Phosphatase: 63 U/L (ref 39–117)
BUN: 14 mg/dL (ref 6–23)
CO2: 24 mEq/L (ref 19–32)
Calcium: 9.4 mg/dL (ref 8.4–10.5)
Chloride: 102 mEq/L (ref 96–112)
Creatinine, Ser: 1.02 mg/dL (ref 0.50–1.35)
Glucose, Bld: 172 mg/dL — ABNORMAL HIGH (ref 70–99)
Potassium: 3.9 mEq/L (ref 3.5–5.3)
Sodium: 137 mEq/L (ref 135–145)
Total Bilirubin: 0.7 mg/dL (ref 0.3–1.2)
Total Protein: 6.8 g/dL (ref 6.0–8.3)

## 2012-04-06 LAB — CBC WITH DIFFERENTIAL/PLATELET
BASO%: 0.1 % (ref 0.0–2.0)
Basophils Absolute: 0 10*3/uL (ref 0.0–0.1)
EOS%: 0.7 % (ref 0.0–7.0)
Eosinophils Absolute: 0.1 10*3/uL (ref 0.0–0.5)
HCT: 42.7 % (ref 38.4–49.9)
HGB: 15.5 g/dL (ref 13.0–17.1)
LYMPH%: 8.6 % — ABNORMAL LOW (ref 14.0–49.0)
MCH: 31 pg (ref 27.2–33.4)
MCHC: 36.3 g/dL — ABNORMAL HIGH (ref 32.0–36.0)
MCV: 85.4 fL (ref 79.3–98.0)
MONO#: 0.8 10*3/uL (ref 0.1–0.9)
MONO%: 11.6 % (ref 0.0–14.0)
NEUT#: 5.5 10*3/uL (ref 1.5–6.5)
NEUT%: 79 % — ABNORMAL HIGH (ref 39.0–75.0)
Platelets: 206 10*3/uL (ref 140–400)
RBC: 5 10*6/uL (ref 4.20–5.82)
RDW: 12.1 % (ref 11.0–14.6)
WBC: 7 10*3/uL (ref 4.0–10.3)
lymph#: 0.6 10*3/uL — ABNORMAL LOW (ref 0.9–3.3)
nRBC: 0 % (ref 0–0)

## 2012-04-06 MED ORDER — POTASSIUM CHLORIDE 2 MEQ/ML IV SOLN
Freq: Once | INTRAVENOUS | Status: AC
  Administered 2012-04-06: 09:00:00 via INTRAVENOUS
  Filled 2012-04-06: qty 10

## 2012-04-06 MED ORDER — ONDANSETRON 16 MG/50ML IVPB (CHCC)
16.0000 mg | Freq: Once | INTRAVENOUS | Status: AC
  Administered 2012-04-06: 16 mg via INTRAVENOUS

## 2012-04-06 MED ORDER — DEXAMETHASONE SODIUM PHOSPHATE 4 MG/ML IJ SOLN
20.0000 mg | Freq: Once | INTRAMUSCULAR | Status: AC
  Administered 2012-04-06: 20 mg via INTRAVENOUS

## 2012-04-06 MED ORDER — SODIUM CHLORIDE 0.9 % IV SOLN
Freq: Once | INTRAVENOUS | Status: AC
  Administered 2012-04-06: 09:00:00 via INTRAVENOUS

## 2012-04-06 MED ORDER — SODIUM CHLORIDE 0.9 % IV SOLN
40.0000 mg/m2 | Freq: Once | INTRAVENOUS | Status: AC
  Administered 2012-04-06: 84 mg via INTRAVENOUS
  Filled 2012-04-06: qty 84

## 2012-04-06 NOTE — Progress Notes (Signed)
Bobby Gonzalez is receiving his 2nd chemotherapy treatment utilizing cisplatin.  He continues with daily radiation therapy.  His weight has decreased to 195.4 pounds on August 2nd, from 200 pounds documented July 1st.  The patient reports nausea and vomiting for 3 days late in the week after his 1st chemotherapy treatment.  He has been trying to take his nausea medications alternating them as needed.  He continues to use water flushes in his tube, often utilizing more water through his tube to help with hydration.  He is not putting any formula in his tube at this time.  The patient has been drinking 1 Ensure Plus daily.  He complains of satiety at meals.  He is eating approximately 3 times a day.  NUTRITION DIAGNOSIS:  Predicted suboptimal energy intake has evolved into inadequate oral intake related to diagnosis of tonsil cancer and associated treatments, as evidenced by patient's verbalization of decreased oral intake secondary to nausea and vomiting and approximate 5-pound weight loss.  INTERVENTION:  I have encouraged Bobby Gonzalez to consume Ensure Plus to t.i.d. between meals and to continue to eat small amounts of soft moist foods at mealtimes.  I have given him specific examples of foods he can incorporate and provided additional information about foods to eat with nausea.  I have encouraged him to continue supplemental water through the tube as needed, but stressed the importance of continued oral intake.  He would like to defer tube feeding at this time and see if he can manage his nausea and vomiting with this round of chemo.  However, he is agreeable to starting tube feeding when needed.  MONITORING/EVALUATION/GOALS:  The patient had issues with oral tolerance, but has only lost approximately 2% of his usual body weight. He will continue to work to increase oral intake and we will begin tube feedings as needed when weight loss exceeds 5% of his usual weight.  NEXT VISIT:  Monday, August 12th during  chemotherapy.   ______________________________ Zenovia Jarred, RD, CSO, LDN Clinical Nutrition Specialist BN/MEDQ  D:  04/06/2012  T:  04/06/2012  Job:  [id number]

## 2012-04-06 NOTE — Patient Instructions (Addendum)
[facility] Discharge Instructions for Patients Receiving Chemotherapy  Today you received the following chemotherapy agents Cisplatin To help prevent nausea and vomiting after your treatment, we encourage you to take your nausea medication as prescribed.  If you develop nausea and vomiting that is not controlled by your nausea medication, call the clinic. If it is after clinic hours your family physician or the after hours number for the clinic or go to the Emergency Department.   BELOW ARE SYMPTOMS THAT SHOULD BE REPORTED IMMEDIATELY:  *FEVER GREATER THAN 100.5 F  *CHILLS WITH OR WITHOUT FEVER  NAUSEA AND VOMITING THAT IS NOT CONTROLLED WITH YOUR NAUSEA MEDICATION  *UNUSUAL SHORTNESS OF BREATH  *UNUSUAL BRUISING OR BLEEDING  TENDERNESS IN MOUTH AND THROAT WITH OR WITHOUT PRESENCE OF ULCERS  *URINARY PROBLEMS  *BOWEL PROBLEMS  UNUSUAL RASH Items with * indicate a potential emergency and should be followed up as soon as possible.  One of the nurses will contact you 24 hours after your treatment. Please let the nurse know about any problems that you may have experienced. Feel free to call the clinic you have any questions or concerns. The clinic phone number is [phone].   I have been informed and understand all the instructions given to me. I know to contact the clinic, my physician, or go to the Emergency Department if any problems should occur. I do not have any questions at this time, but understand that I may call the clinic during office hours   should I have any questions or need assistance in obtaining follow up care.    __________________________________________  _____________  __________ Signature of Patient or Authorized Representative            Date                   Time    __________________________________________ Nurse's Signature    

## 2012-04-06 NOTE — Progress Notes (Signed)
Pt's total urine output pre-Cisplatin 322ml-dhp, rn Pt's total urine output post-Cisplatin 1529ml-dhp, rn

## 2012-04-07 ENCOUNTER — Ambulatory Visit
Admission: RE | Admit: 2012-04-07 | Discharge: 2012-04-07 | Disposition: A | Discharge status: Home / Self Care | Payer: BC Managed Care – PPO | Source: Ambulatory Visit | Attending: Radiation Oncology | Admitting: Radiation Oncology

## 2012-04-08 ENCOUNTER — Ambulatory Visit
Admission: RE | Admit: 2012-04-08 | Discharge: 2012-04-08 | Disposition: A | Discharge status: Home / Self Care | Payer: BC Managed Care – PPO | Source: Ambulatory Visit | Attending: Radiation Oncology | Admitting: Radiation Oncology

## 2012-04-09 ENCOUNTER — Ambulatory Visit
Admission: RE | Admit: 2012-04-09 | Discharge: 2012-04-09 | Disposition: A | Discharge status: Home / Self Care | Payer: BC Managed Care – PPO | Source: Ambulatory Visit | Attending: Radiation Oncology | Admitting: Radiation Oncology

## 2012-04-09 ENCOUNTER — Ambulatory Visit: Payer: Self-pay | Admitting: Oncology

## 2012-04-09 ENCOUNTER — Other Ambulatory Visit: Payer: Self-pay | Admitting: Lab

## 2012-04-09 DIAGNOSIS — C099 Malignant neoplasm of tonsil, unspecified: Secondary | ICD-10-CM

## 2012-04-09 NOTE — Progress Notes (Signed)
HERE TODAY FOR PUT OF H/N.  HAS DRY MOUTH AND USING BIOTENE.  SKIN LOOKS GREAT.  TASTE HAS CHANGED AND HAS HAD SOME NAUSEA THIS WEEK ON TUESDAY

## 2012-04-09 NOTE — Progress Notes (Signed)
   Department of Radiation Oncology  Phone:  [phone] Fax:        [phone]  Weekly Treatment Note    Name: Bobby Gonzalez Date: 04/09/2012 MRN: [id number] DOB: [birth year]   Current dose: 19.1 Gy  Current fraction: 9   MEDICATIONS: Current Outpatient Prescriptions  Medication Sig Dispense Refill  . HYDROcodone-acetaminophen (LORTAB) 7.5-500 MG per tablet Take 1 tablet by mouth every 6 (six) hours as needed.      Marland Kitchen LORazepam (ATIVAN) 0.5 MG tablet Take 1 tablet (0.5 mg total) by mouth every 6 (six) hours as needed (Nausea or vomiting).  30 tablet  0  . ondansetron (ZOFRAN) 8 MG tablet Take 1 tablet two times a day as needed for nausea or vomiting starting on the third day after chemotherapy.  30 tablet  1  . prochlorperazine (COMPAZINE) 10 MG tablet Take 1 tablet (10 mg total) by mouth every 6 (six) hours as needed (Nausea or vomiting).  30 tablet  1  . sodium fluoride (FLUORISHIELD) 1.1 % GEL dental gel Brush and floss, and then instill one drop of gel per tooth space of fluoride tray. Place over teeth for 5 minutes. Remove. Spit out excess. Repeat nightly.  120 mL  prn     ALLERGIES: Penicillins   LABORATORY DATA:  Lab Results  Component Value Date   WBC 7.0 04/06/2012   HGB 15.5 04/06/2012   HCT 42.7 04/06/2012   MCV 85.4 04/06/2012   PLT 206 04/06/2012   Lab Results  Component Value Date   NA 137 04/06/2012   K 3.9 04/06/2012   CL 102 04/06/2012   CO2 24 04/06/2012   Lab Results  Component Value Date   ALT 25 04/06/2012   AST 15 04/06/2012   ALKPHOS 63 04/06/2012   BILITOT 0.7 04/06/2012     NARRATIVE: Bobby Gonzalez was seen today for weekly treatment management. The chart was checked and the patient's films were reviewed. The patient states that he is doing quite well overall. He has had a change in taste. Nausea is under reasonably good control. He does have dry mouth and is using by 18.  PHYSICAL EXAMINATION: vitals were not taken for this visit.     the patient's skin  looks excellent with minimal change at this point. No desquamation. Early mucositis is emerging in the posterior oral cavity.  ASSESSMENT: The patient is doing satisfactorily with treatment.  PLAN: We will continue with the patient's radiation treatment as planned.

## 2012-04-09 NOTE — Progress Notes (Signed)
  Radiation Oncology         [phone] ________________________________  Name: Bobby Gonzalez MRN: [id number]  Date: 03/20/2012  DOB: 08-Mar-1966  SIMULATION AND TREATMENT PLANNING NOTE   CONSENT VERIFIED: yes   SET UP: Patient is set-up supine   IMMOBILIZATION: The following immobilization is used:Aquaplast Mask. This complex treatment device will be used on a daily basis during the patient's treatment.  Diagnosis:  Tonsillar cancer   NARRATIVE: The patient was brought to the CT Simulation planning suite.  Identity was confirmed.  All relevant records and images related to the planned course of therapy were reviewed.  Then, the patient was positioned in a stable reproducible clinical set-up for radiation therapy using an aquaplast mask.  Skin markings were placed.  The CT images were loaded into the planning software where the target and avoidance structures were contoured.The radiation prescription was entered and confirmed.  The patient will receive 69.96 Gy in 33 fractions to the high risk region/PTV.  Daily image guidance is ordered, and this will be used on a daily basis. This is necessary to ensure accurate and precise localization of the target in addition to accurate alignment of the normal tissue structures in this region.  Treatment planning then occurred.  I have requested : Intensity Modulated Radiotherapy (IMRT) is medically necessary for this case for the following reason:  Dose homogeneity and treatment of a head and neck site. The target is in close proximity to critical normal structures, as well as the parotid glands which may negatively impact the patient's quality of life if not maximally spared given the constraints of the overall treatment plan. IMRT is thus medically necessary to appropriately treat the patient.   Special treatment procedure The patient will receive chemotherapy during the course of radiation treatment. The patient may experience increased or  overlapping toxicity due to this combined-modality approach and the patient will be monitored for such problems. This may include extra lab work as necessary. This therefore constitutes a special treatment procedure.    ________________________________   Radene Gunning, MD, PhD

## 2012-04-10 ENCOUNTER — Telehealth: Payer: Self-pay | Admitting: *Deleted

## 2012-04-10 ENCOUNTER — Ambulatory Visit (HOSPITAL_BASED_OUTPATIENT_CLINIC_OR_DEPARTMENT_OTHER): Payer: BC Managed Care – PPO | Admitting: Oncology

## 2012-04-10 ENCOUNTER — Other Ambulatory Visit (HOSPITAL_BASED_OUTPATIENT_CLINIC_OR_DEPARTMENT_OTHER): Payer: BC Managed Care – PPO | Admitting: Lab

## 2012-04-10 ENCOUNTER — Ambulatory Visit
Admission: RE | Admit: 2012-04-10 | Discharge: 2012-04-10 | Disposition: A | Discharge status: Home / Self Care | Payer: BC Managed Care – PPO | Source: Ambulatory Visit | Attending: Radiation Oncology | Admitting: Radiation Oncology

## 2012-04-10 ENCOUNTER — Encounter: Payer: Self-pay | Admitting: Oncology

## 2012-04-10 VITALS — BP 131/87 | HR 79 | Temp 99.3°F | Resp 20 | Ht 70.0 in | Wt 191.9 lb

## 2012-04-10 DIAGNOSIS — N289 Disorder of kidney and ureter, unspecified: Secondary | ICD-10-CM

## 2012-04-10 DIAGNOSIS — N2889 Other specified disorders of kidney and ureter: Secondary | ICD-10-CM

## 2012-04-10 DIAGNOSIS — C099 Malignant neoplasm of tonsil, unspecified: Secondary | ICD-10-CM

## 2012-04-10 LAB — CBC WITH DIFFERENTIAL/PLATELET
BASO%: 0.1 % (ref 0.0–2.0)
Basophils Absolute: 0 10*3/uL (ref 0.0–0.1)
EOS%: 0.5 % (ref 0.0–7.0)
Eosinophils Absolute: 0 10*3/uL (ref 0.0–0.5)
HCT: 44.7 % (ref 38.4–49.9)
HGB: 15.6 g/dL (ref 13.0–17.1)
LYMPH%: 6.3 % — ABNORMAL LOW (ref 14.0–49.0)
MCH: 30.9 pg (ref 27.2–33.4)
MCHC: 34.8 g/dL (ref 32.0–36.0)
MCV: 89 fL (ref 79.3–98.0)
MONO#: 0.8 10*3/uL (ref 0.1–0.9)
MONO%: 11.6 % (ref 0.0–14.0)
NEUT#: 5.4 10*3/uL (ref 1.5–6.5)
NEUT%: 81.5 % — ABNORMAL HIGH (ref 39.0–75.0)
Platelets: 235 10*3/uL (ref 140–400)
RBC: 5.03 10*6/uL (ref 4.20–5.82)
RDW: 12.7 % (ref 11.0–14.6)
WBC: 6.6 10*3/uL (ref 4.0–10.3)
lymph#: 0.4 10*3/uL — ABNORMAL LOW (ref 0.9–3.3)

## 2012-04-10 LAB — COMPREHENSIVE METABOLIC PANEL
ALT: 22 U/L (ref 0–53)
AST: 13 U/L (ref 0–37)
Albumin: 4.1 g/dL (ref 3.5–5.2)
Alkaline Phosphatase: 64 U/L (ref 39–117)
BUN: 15 mg/dL (ref 6–23)
CO2: 27 mEq/L (ref 19–32)
Calcium: 9.3 mg/dL (ref 8.4–10.5)
Chloride: 101 mEq/L (ref 96–112)
Creatinine, Ser: 1.05 mg/dL (ref 0.50–1.35)
Glucose, Bld: 104 mg/dL — ABNORMAL HIGH (ref 70–99)
Potassium: 4.2 mEq/L (ref 3.5–5.3)
Sodium: 137 mEq/L (ref 135–145)
Total Bilirubin: 1.3 mg/dL — ABNORMAL HIGH (ref 0.3–1.2)
Total Protein: 7 g/dL (ref 6.0–8.3)

## 2012-04-10 MED ORDER — HYDROCODONE-ACETAMINOPHEN 7.5-500 MG/15ML PO SOLN: 15.0000 mL | Freq: Four times a day (QID) | ORAL | Status: AC | PRN

## 2012-04-10 NOTE — Telephone Encounter (Signed)
Ms. Nancy Fetter notified of call from wife.  No appt. Change but verbal order received for patient to try all anti-emetics as ordered.  [facility] and he has taken the lorazepam and is drinking with no further emesis at this time.  Reviewed how to use anti-emetics, explaining the long acting ondansetron and how to use the compazine while awaiting to take second ondansetron and if he can't swallow a pill and keep it down to use the lorazepam sublingually.   He needs to keep the afternoon appt. And we'll f/u at this time.

## 2012-04-10 NOTE — Telephone Encounter (Signed)
Patient's wife called reporting he has vomited 10 to 15 times since 8:00am.  Has taken the zofran and asked what to do next.  He called her at work.  Instructed to try the lorazepam sublingually.  He is scheduled today to see mid-level.  He reports emesis is yellow, small amounts not associated with any coughing.  Last night he ate yogurt ice cream but has not eaten today.  Received cisplatin on 04-06-2012.  Will notify providers.

## 2012-04-10 NOTE — Patient Instructions (Addendum)
You will receive Aloxi and Emend for nausea with your chemotherapy. Aloxi is long-acting and similar to Ondansetron. After chemotherapy, use your Prochlorperazine and Lorazepam as needed for nausea. You may use your Ondansetron beginning about 48 hours after your chemotherapy.    Mouth rinse: Alternate between the following:  - salt/baking soda (1 teaspoon of salt; 1 teaspoon of bakign soda; in 1 Liter of water).  - diluted hydrogen peroxide (1 part peroxide to 4 parts water). The diluted peroxide breaks up the thick phlegm.

## 2012-04-10 NOTE — Progress Notes (Signed)
[facility]  Telephone:([phone] Fax:([phone]   OFFICE PROGRESS NOTE   Cc:  Bobby Blamer, MD  DIAGNOSIS:  1. 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 Right renal mass with positive PET scan; pending evaluation and workup.  2. Right renal mass presumed to be renal cell carcinoma.   CURRENT THERAPY:  1. Due to start concurrent chemo radiation today 03/30/2012 with daily XRT and weekly Cisplatin.  2. Pending nephrectomy.   INTERVAL HISTORY: Bobby Gonzalez 46 y.o. male returns for regular follow up with his wife.  He had chemo last Monday.  He developed severe nausea/vomiting this morning. Reports that he had taken Zofran. He has mild nausea and then used his fluoride gel. Soon after, he vomited about 10-15 times. He used the Compazine with resolution of the vomiting. He has mild nausea this afternoon, but no further vomiting. He has not noticed any significant shrinkage of the tumor in his left cervical neck, but does think the mass is softer.  He has some fatigue today; but he is still independent of activities of daily living.  He has had some slight weight loss. Not using the PEG routinely for nutrition, but plans to start using Ensure 3 cans/day in addition to eating. Has mild pain to his throat, does not yet have Lortab elixir prescription. Using salt/baking soda rinses and Biotene.  Patient denies fever, headache, visual changes, confusion, drenching night sweats, palpable lymph node swelling, mucositis, odynophagia, dysphagia, jaundice, chest pain, palpitation, shortness of breath, dyspnea on exertion, productive cough, gum bleeding, epistaxis, hematemesis, hemoptysis, abdominal pain, abdominal swelling, early satiety, melena, hematochezia, hematuria, skin rash, spontaneous bleeding, joint swelling, joint pain, heat or cold intolerance, bowel bladder incontinence, back pain, focal motor weakness, paresthesia, depression,  suicidal or homicidal ideation, feeling hopelessness.   Past Medical History  Diagnosis Date  . 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  Marland Kitchen HTN (hypertension)   . GERD (gastroesophageal reflux disease)   . Allergy     pcns  . Diverticula of colon 03/10/12    multiple rectosigmoid colonic diverticula/ ct abd/pelvis  . Chronic kidney disease     right renal mass 7.3x6.7x6.8cm renal cell ca  . Cancer 02/17/12 bx    left tonsil squamous cell ca,HPV positive,,spread to left cervical node  . Renal mass 03/30/2012    Past Surgical History  Procedure Date  . Wisdom tooth extraction   . Direct laryngoscopy     S/P DL, Biopsy-Dr. Pollyann Kennedy. Pathology postive for SCCa of Left Tonsil  . Gastrostomy tube placement 03/02/12  . Multiple tooth extractions 03/04/12    with DR. Mohorn  . Peg tube placement   . Tonsil biopsy 02/17/12    SCCa left tomnsil,HPV positive,spread to l cervical node    Current Outpatient Prescriptions  Medication Sig Dispense Refill  . HYDROcodone-acetaminophen (LORTAB) 7.5-500 MG/15ML solution Take 15 mLs by mouth every 6 (six) hours as needed for pain.  500 mL  0  . LORazepam (ATIVAN) 0.5 MG tablet Take 1 tablet (0.5 mg total) by mouth every 6 (six) hours as needed (Nausea or vomiting).  30 tablet  0  . ondansetron (ZOFRAN) 8 MG tablet Take 1 tablet two times a day as needed for nausea or vomiting starting on the third day after chemotherapy.  30 tablet  1  . prochlorperazine (COMPAZINE) 10 MG tablet Take 1 tablet (10 mg total) by mouth every 6 (six) hours  as needed (Nausea or vomiting).  30 tablet  1  . sodium fluoride (FLUORISHIELD) 1.1 % GEL dental gel Brush and floss, and then instill one drop of gel per tooth space of fluoride tray. Place over teeth for 5 minutes. Remove. Spit out excess. Repeat nightly.  120 mL  prn    ALLERGIES:  is allergic to penicillins.  REVIEW OF SYSTEMS:  The rest of the 14-point review of system was negative.   Filed  Vitals:   04/10/12 1437  BP: 131/87  Pulse: 79  Temp: 99.3 F (37.4 C)  Resp: 20   Wt Readings from Last 3 Encounters:  04/10/12 191 lb 14.4 oz (87.045 kg)  04/09/12 193 lb 11.2 oz (87.862 kg)  04/03/12 195 lb 6.4 oz (88.633 kg)   ECOG Performance status: 0-1  PHYSICAL EXAMINATION:   General: well-nourished man in no acute distress. Eyes: no scleral icterus. ENT: There were no oropharyngeal lesions on my unaided exam. His left tonsil area appeared normal. Neck was without thyromegaly. Lymphatics: Negative supraclavicular or axillary adenopathy. Positive for a large about 4cm left level II node. Respiratory: lungs were clear bilaterally without wheezing or crackles. Cardiovascular: Regular rate and rhythm, S1/S2, without murmur, rub or gallop. There was no pedal edema. GI: abdomen was soft, flat, nontender, nondistended, without organomegaly. PEG tube was dry, clean, intact. Muscoloskeletal: no spinal tenderness of palpation of vertebral spine. Skin exam was without echymosis, petichae. Neuro exam was nonfocal. Patient was able to get on and off exam table without assistance. Gait was normal. Patient was alerted and oriented. Attention was good. Language was appropriate. Mood was normal without depression. Speech was not pressured. Thought content was not tangential.    LABORATORY/RADIOLOGY DATA:  Lab Results  Component Value Date   WBC 6.6 04/10/2012   HGB 15.6 04/10/2012   HCT 44.7 04/10/2012   PLT 235 04/10/2012   GLUCOSE 104* 04/10/2012   ALKPHOS 64 04/10/2012   ALT 22 04/10/2012   AST 13 04/10/2012   NA 137 04/10/2012   K 4.2 04/10/2012   CL 101 04/10/2012   CREATININE 1.05 04/10/2012   BUN 15 04/10/2012   CO2 27 04/10/2012   INR 0.94 03/02/2012    ASSESSMENT AND PLAN:   1. Right renal mass; positive on PET scan. To be addressed post treatment for head/neck cancer.   2. Left tonsil squamous cell carcinoma:  - He is on weekly cisplatin/XRT. Due for week 3 of treatment on 04/13/12. - He has grade 2  nausea/vomiting.  I advised him to continue to rotate through Ativan/Compazine/Zofran regularly. Will switch pre-medications to Aloxi and Emend. - Hydration: I advised him to continue at least 2 liter (or 60 oz) of fluid everyday to maintain normal kidney function.  - Mucositis prevention: I advised him to continue with mouth rinses at least 3 times a day (1 teaspoon of salt + 1 teaspoon of baking soda in 1 liter of water) to decrease risk of mouth sores. I have given him a prescription for Lortab elixir for throat pain. - Calorie/protein: I advised him to keep oral intake to maintain his weight. He will continue to eat food by mouth as tolerated. He will start using PEG this weekend. - I advised him to continue with swallowing exercise to decrease risk of esophageal stricture.   3. Follow up: In 1 week.    The length of time of the face-to-face encounter was 15  minutes. More than 50% of time was spent counseling and coordination  of care.

## 2012-04-10 NOTE — Telephone Encounter (Signed)
Cancelled lab appointments for 04-20-2012 and 04-13-2012 lab only scheduled patient for 04-17-2012 for lab and midlevel

## 2012-04-11 ENCOUNTER — Telehealth: Payer: Self-pay | Admitting: Oncology

## 2012-04-11 NOTE — Telephone Encounter (Signed)
On call: wife calling as patient has noticed some sore throat for first time today. He has liquid hydrocodone/ acetaminophen and will try that; he will let Dr Gaylyn Rong or Dr Mitzi Hansen know if not helpful enough by Monday 04-13-12.  Ila Mcgill, MD

## 2012-04-13 ENCOUNTER — Ambulatory Visit
Admission: RE | Admit: 2012-04-13 | Discharge: 2012-04-13 | Disposition: A | Discharge status: Home / Self Care | Payer: BC Managed Care – PPO | Source: Ambulatory Visit | Attending: Radiation Oncology | Admitting: Radiation Oncology

## 2012-04-13 ENCOUNTER — Telehealth: Payer: Self-pay | Admitting: Oncology

## 2012-04-13 ENCOUNTER — Ambulatory Visit: Payer: BC Managed Care – PPO | Admitting: Nutrition

## 2012-04-13 ENCOUNTER — Ambulatory Visit (HOSPITAL_BASED_OUTPATIENT_CLINIC_OR_DEPARTMENT_OTHER): Payer: BC Managed Care – PPO

## 2012-04-13 ENCOUNTER — Other Ambulatory Visit: Payer: Self-pay

## 2012-04-13 VITALS — BP 145/91 | HR 80 | Temp 98.4°F | Resp 17

## 2012-04-13 DIAGNOSIS — C099 Malignant neoplasm of tonsil, unspecified: Secondary | ICD-10-CM

## 2012-04-13 DIAGNOSIS — Z5111 Encounter for antineoplastic chemotherapy: Secondary | ICD-10-CM

## 2012-04-13 MED ORDER — JEVITY 1.2 CAL PO LIQD: ORAL | Status: DC

## 2012-04-13 MED ORDER — DEXAMETHASONE SODIUM PHOSPHATE 10 MG/ML IJ SOLN
10.0000 mg | Freq: Once | INTRAMUSCULAR | Status: AC
  Administered 2012-04-13: 10 mg via INTRAVENOUS

## 2012-04-13 MED ORDER — SODIUM CHLORIDE 0.9 % IV SOLN
150.0000 mg | Freq: Once | INTRAVENOUS | Status: AC
  Administered 2012-04-13: 150 mg via INTRAVENOUS
  Filled 2012-04-13: qty 5

## 2012-04-13 MED ORDER — SODIUM CHLORIDE 0.9 % IV SOLN
40.0000 mg/m2 | Freq: Once | INTRAVENOUS | Status: AC
  Administered 2012-04-13: 84 mg via INTRAVENOUS
  Filled 2012-04-13: qty 84

## 2012-04-13 MED ORDER — SODIUM CHLORIDE 0.9 % IV SOLN: Freq: Once | INTRAVENOUS | Status: DC

## 2012-04-13 MED ORDER — POTASSIUM CHLORIDE 2 MEQ/ML IV SOLN
Freq: Once | INTRAVENOUS | Status: AC
  Administered 2012-04-13: 10:00:00 via INTRAVENOUS
  Filled 2012-04-13: qty 10

## 2012-04-13 MED ORDER — PALONOSETRON HCL INJECTION 0.25 MG/5ML
0.2500 mg | Freq: Once | INTRAVENOUS | Status: AC
  Administered 2012-04-13: 0.25 mg via INTRAVENOUS

## 2012-04-13 NOTE — Progress Notes (Signed)
1600 Pre cisplatin hydration urine output 320 cc and post cisplatin urine output 320 cc, total of 640 cc of urine output during treatment today.

## 2012-04-13 NOTE — Telephone Encounter (Signed)
See message to [facility]

## 2012-04-13 NOTE — Patient Instructions (Signed)
[facility] Discharge Instructions for Patients Receiving Chemotherapy  Today you received the following chemotherapy agent Cisplatin.  To help prevent nausea and vomiting after your treatment, we encourage you to take your nausea medication. Begin taking it as often as prescribed for by Dr. Gaylyn Rong.    If you develop nausea and vomiting that is not controlled by your nausea medication, call the clinic. If it is after clinic hours your family physician or the after hours number for the clinic or go to the Emergency Department.   BELOW ARE SYMPTOMS THAT SHOULD BE REPORTED IMMEDIATELY:  *FEVER GREATER THAN 100.5 F  *CHILLS WITH OR WITHOUT FEVER  NAUSEA AND VOMITING THAT IS NOT CONTROLLED WITH YOUR NAUSEA MEDICATION  *UNUSUAL SHORTNESS OF BREATH  *UNUSUAL BRUISING OR BLEEDING  TENDERNESS IN MOUTH AND THROAT WITH OR WITHOUT PRESENCE OF ULCERS  *URINARY PROBLEMS  *BOWEL PROBLEMS  UNUSUAL RASH Items with * indicate a potential emergency and should be followed up as soon as possible.  One of the nurses will contact you 24 hours after your treatment. Please let the nurse know about any problems that you may have experienced. Feel free to call the clinic you have any questions or concerns. The clinic phone number is [phone].   I have been informed and understand all the instructions given to me. I know to contact the clinic, my physician, or go to the Emergency Department if any problems should occur. I do not have any questions at this time, but understand that I may call the clinic during office hours   should I have any questions or need assistance in obtaining follow up care.    __________________________________________  _____________  __________ Signature of Patient or Authorized Representative            Date                   Time    __________________________________________ Nurse's Signature

## 2012-04-13 NOTE — Progress Notes (Signed)
I spoke with [name], his daughter and his wife today in the chemotherapy area.  The patient's weight has declined to 191.9 pounds documented August 9th from 195.4 pounds documented August 2nd.  The patient has had a 4% weight loss. He reports a very sore throat beginning on Friday and continuing over the weekend.  He was not able to fill his liquid pain medication until today but he has been taking some pain medication that he was prescribed originally.  He is concerned that this is going to get worse and expresses fear that he does not know how his pain could be any worse than it is now.  NUTRITION DIAGNOSIS:  Inadequate oral intake continues.  INTERVENTION:  I have encouraged [name] to begin tube feedings for nutrition support.  The patient is agreeable.  He will begin continuous feedings using a mixture of 50/50 Osmolite 1.2 and Jevity 1.2.  He will begin at 40 mL an hour.  He will increase the rate 10 mL every 4 hours as tolerated to a goal of 120 mL an hour and he will run this for 16 hours every day.  He is to flush his tube with 240 mL of free water before continuous feeding starts and 60 mL of free water after continuous feeding.  In addition, he is to flush or drink an additional 240 mL of water twice daily.  I have written tube feeding orders for [facility] to supply pump and nursing.  I have provided him with samples of Osmolite 1.2 and Jevity 1.2 to take with him today.  I have provided written instructions for him.  I have answered his questions, his daughter's questions and his wife's questions.  I have reinforced the importance of him continuing to eat by mouth soft moist foods and liquids to supplement his tube feeding.  I have reinforced the importance of swallowing exercises.  MONITORING/EVALUATION/GOALS:  The patient is unable to tolerate soft oral diet to meet 100% of his needs.  He will tolerate continuous tube feeding to minimize further weight loss.  NEXT VISIT:   Monday August 19 during chemotherapy.   ______________________________ Zenovia Jarred, RD, CSO, LDN Clinical Nutrition Specialist BN/MEDQ  D:  04/13/2012  T:  04/13/2012  Job:  [id number]

## 2012-04-14 ENCOUNTER — Ambulatory Visit
Admission: RE | Admit: 2012-04-14 | Discharge: 2012-04-14 | Disposition: A | Discharge status: Home / Self Care | Payer: BC Managed Care – PPO | Source: Ambulatory Visit | Attending: Radiation Oncology | Admitting: Radiation Oncology

## 2012-04-14 DIAGNOSIS — C099 Malignant neoplasm of tonsil, unspecified: Secondary | ICD-10-CM

## 2012-04-14 MED ORDER — CHLORPROMAZINE HCL 25 MG PO TABS: 25.0000 mg | ORAL_TABLET | Freq: Three times a day (TID) | ORAL | Status: DC | PRN

## 2012-04-14 NOTE — Progress Notes (Signed)
Received office notes from Dr. Heloise Purpura @ [facility]; forwarded to Dr. Gaylyn Rong.

## 2012-04-14 NOTE — Progress Notes (Unsigned)
Patient complains of 'hiccups'; order [facility], per Clenton Pare, NP.

## 2012-04-15 ENCOUNTER — Ambulatory Visit (HOSPITAL_COMMUNITY): Payer: Medicaid - Dental | Admitting: Dentistry

## 2012-04-15 ENCOUNTER — Other Ambulatory Visit: Payer: Self-pay | Admitting: Urology

## 2012-04-15 ENCOUNTER — Ambulatory Visit
Admission: RE | Admit: 2012-04-15 | Discharge: 2012-04-15 | Disposition: A | Discharge status: Home / Self Care | Payer: BC Managed Care – PPO | Source: Ambulatory Visit | Attending: Radiation Oncology | Admitting: Radiation Oncology

## 2012-04-15 VITALS — BP 121/70 | HR 62 | Temp 98.1°F

## 2012-04-15 DIAGNOSIS — R634 Abnormal weight loss: Secondary | ICD-10-CM

## 2012-04-15 DIAGNOSIS — R1310 Dysphagia, unspecified: Secondary | ICD-10-CM

## 2012-04-15 DIAGNOSIS — K1230 Oral mucositis (ulcerative), unspecified: Secondary | ICD-10-CM

## 2012-04-15 DIAGNOSIS — K117 Disturbances of salivary secretion: Secondary | ICD-10-CM

## 2012-04-15 DIAGNOSIS — R432 Parageusia: Secondary | ICD-10-CM

## 2012-04-15 DIAGNOSIS — R439 Unspecified disturbances of smell and taste: Secondary | ICD-10-CM

## 2012-04-15 DIAGNOSIS — K121 Other forms of stomatitis: Secondary | ICD-10-CM

## 2012-04-15 DIAGNOSIS — K1233 Oral mucositis (ulcerative) due to radiation: Secondary | ICD-10-CM

## 2012-04-15 NOTE — Progress Notes (Signed)
Received office notes from Dr. Heloise Purpura @ [facility]; forwarded to Dr. Gaylyn Rong.

## 2012-04-16 ENCOUNTER — Ambulatory Visit
Admission: RE | Admit: 2012-04-16 | Discharge: 2012-04-16 | Disposition: A | Discharge status: Home / Self Care | Payer: BC Managed Care – PPO | Source: Ambulatory Visit | Attending: Radiation Oncology | Admitting: Radiation Oncology

## 2012-04-16 ENCOUNTER — Encounter (HOSPITAL_COMMUNITY): Payer: Self-pay | Admitting: Dentistry

## 2012-04-16 NOTE — Progress Notes (Signed)
April 15, 2012  BP:  121/70                  P:62              T:  98.1           Wgt:190 lbs.  Patient was 202 lbs at start. Bobby Gonzalez presents for a periodic oral examination during radiation therapy.   Patient has completed 12 of 33 radiation treatments.  REVIEW OF CHIEF COMPLAINTS: DRY MOUTH: Yes. HARD TO SWALLOW: Yes  HURT TO SWALLOW: Yes TASTE CHANGES: Taste is gone. SORES IN MOUTH: Not that he is aware of. TRISMUS SYMPTOMS: Having no problems with trismus. SYMPTOM RELIEF:  Using salt water and baking soda, peroxide and baking soda,  Biotene Rinses. HOME ORAL HYGIENE REGIMEN:  BRUSHING: 3X a day FLOSSING: 1X a day RINSING: see above FLUORIDE: Doing fluoride at night having no problems TRISMUS EXERCISES: Maximum interincisal opening: 45 mm.   DENTAL EXAM: ORAL HYGIENE(PLAQUE): Good oral hygiene. LOCATION OF MUCOSITIS: Back of throat and soft palate. DESCRIPTION OF SALIVA: Decreased and foamy. ANY EXPOSED BONE: None noted. OTHER WATCHED AREAS: Previous extraction sites.  DIAGNOSES: 1.  Xerostomia  2.  Dysgeusia 3.  Dysphagia 4.  Odynophagia 5.  Mucositis 6.  Weight Loss  RECOMMENDATIONS: 1. Brush after meals and at bedtime. Use fluoride at bedtime. 2. Use trismus exercises as directed. 3. Use Biotene Rinse or salt water/baking soda rinses. 4. Multiple sips of water as needed. 5. RTC in two months for periodic oral examination after radiation therapy but before his Nephrectomy with Dr. Laverle Patter on October 22nd.  Call if problems before then. Dr. Kristin Bruins

## 2012-04-17 ENCOUNTER — Ambulatory Visit
Admission: RE | Admit: 2012-04-17 | Discharge: 2012-04-17 | Disposition: A | Discharge status: Home / Self Care | Payer: BC Managed Care – PPO | Source: Ambulatory Visit | Attending: Radiation Oncology | Admitting: Radiation Oncology

## 2012-04-17 ENCOUNTER — Telehealth: Payer: Self-pay | Admitting: Oncology

## 2012-04-17 ENCOUNTER — Encounter: Payer: Self-pay | Admitting: Oncology

## 2012-04-17 ENCOUNTER — Ambulatory Visit (HOSPITAL_BASED_OUTPATIENT_CLINIC_OR_DEPARTMENT_OTHER): Payer: BC Managed Care – PPO | Admitting: Oncology

## 2012-04-17 ENCOUNTER — Encounter: Payer: Self-pay | Admitting: Radiation Oncology

## 2012-04-17 ENCOUNTER — Other Ambulatory Visit (HOSPITAL_BASED_OUTPATIENT_CLINIC_OR_DEPARTMENT_OTHER): Payer: BC Managed Care – PPO | Admitting: Lab

## 2012-04-17 VITALS — BP 135/86 | HR 76 | Temp 98.3°F | Resp 18 | Ht 70.0 in | Wt 186.3 lb

## 2012-04-17 VITALS — BP 127/83 | HR 72 | Temp 98.2°F | Wt 186.0 lb

## 2012-04-17 DIAGNOSIS — B977 Papillomavirus as the cause of diseases classified elsewhere: Secondary | ICD-10-CM

## 2012-04-17 DIAGNOSIS — R112 Nausea with vomiting, unspecified: Secondary | ICD-10-CM

## 2012-04-17 DIAGNOSIS — C099 Malignant neoplasm of tonsil, unspecified: Secondary | ICD-10-CM

## 2012-04-17 LAB — COMPREHENSIVE METABOLIC PANEL
ALT: 18 U/L (ref 0–53)
AST: 11 U/L (ref 0–37)
Albumin: 3.8 g/dL (ref 3.5–5.2)
Alkaline Phosphatase: 71 U/L (ref 39–117)
BUN: 18 mg/dL (ref 6–23)
CO2: 30 mEq/L (ref 19–32)
Calcium: 9.2 mg/dL (ref 8.4–10.5)
Chloride: 97 mEq/L (ref 96–112)
Creatinine, Ser: 1.01 mg/dL (ref 0.50–1.35)
Glucose, Bld: 155 mg/dL — ABNORMAL HIGH (ref 70–99)
Potassium: 4 mEq/L (ref 3.5–5.3)
Sodium: 134 mEq/L — ABNORMAL LOW (ref 135–145)
Total Bilirubin: 0.7 mg/dL (ref 0.3–1.2)
Total Protein: 7.2 g/dL (ref 6.0–8.3)

## 2012-04-17 LAB — CBC WITH DIFFERENTIAL/PLATELET
BASO%: 0 % (ref 0.0–2.0)
Basophils Absolute: 0 10*3/uL (ref 0.0–0.1)
EOS%: 0.2 % (ref 0.0–7.0)
Eosinophils Absolute: 0 10*3/uL (ref 0.0–0.5)
HCT: 42.4 % (ref 38.4–49.9)
HGB: 14.7 g/dL (ref 13.0–17.1)
LYMPH%: 3.1 % — ABNORMAL LOW (ref 14.0–49.0)
MCH: 30.9 pg (ref 27.2–33.4)
MCHC: 34.6 g/dL (ref 32.0–36.0)
MCV: 89.4 fL (ref 79.3–98.0)
MONO#: 0.6 10*3/uL (ref 0.1–0.9)
MONO%: 10.2 % (ref 0.0–14.0)
NEUT#: 5.3 10*3/uL (ref 1.5–6.5)
NEUT%: 86.5 % — ABNORMAL HIGH (ref 39.0–75.0)
Platelets: 242 10*3/uL (ref 140–400)
RBC: 4.75 10*6/uL (ref 4.20–5.82)
RDW: 12.9 % (ref 11.0–14.6)
WBC: 6.1 10*3/uL (ref 4.0–10.3)
lymph#: 0.2 10*3/uL — ABNORMAL LOW (ref 0.9–3.3)

## 2012-04-17 MED ORDER — MAGIC MOUTHWASH W/LIDOCAINE: 5.0000 mL | Freq: Four times a day (QID) | ORAL | Status: DC | PRN

## 2012-04-17 NOTE — Progress Notes (Signed)
Grades throat pain as a level 3.  Softer diet now.  Burning with acidic foods or drinks.  Oral mucosa moist with mucositis roof of mouth and red.ness back of throat.  15./33 fractions/

## 2012-04-17 NOTE — Telephone Encounter (Signed)
appts made and printed for pt a °

## 2012-04-17 NOTE — Progress Notes (Signed)
[facility]  [phone] [phone]   OFFICE PROGRESS NOTE   Cc:  [name], MD  DIAGNOSIS:  1. 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 Right renal mass with positive PET scan; pending evaluation and workup.  2. Right renal mass presumed to be renal cell carcinoma.   CURRENT THERAPY:  1. Due to start concurrent chemo radiation today 03/30/2012 with daily XRT and weekly Cisplatin.  2. Pending nephrectomy.   INTERVAL HISTORY: Bobby Gonzalez 46 y.o. male returns for regular follow up with his wife.  Nausea was better this past week with change of pre-medications to Aloxi and Emend. He has developed hiccups over he past week. Thorazine has been ordered, but not yet picked up from the pharmacy. Slight shrinkage of the tumor in his left cervical neck.  He has some fatigue today; but he is still independent of activities of daily living.  He has had some slight weight loss. Still able to eat soft foods. Has mild pain to his throat; using Lortab twice a day. Using salt/baking soda rinses, diluted peroxide, and Biotene. Using infusional pump for TF. Current rate is 45 cc/hr with goal of 120 cc/hr.  Patient denies fever, headache, visual changes, confusion, drenching night sweats, palpable lymph node swelling, mucositis, odynophagia, dysphagia, jaundice, chest pain, palpitation, shortness of breath, dyspnea on exertion, productive cough, gum bleeding, epistaxis, hematemesis, hemoptysis, abdominal pain, abdominal swelling, early satiety, melena, hematochezia, hematuria, skin rash, spontaneous bleeding, joint swelling, joint pain, heat or cold intolerance, bowel bladder incontinence, back pain, focal motor weakness, paresthesia, depression, suicidal or homicidal ideation, feeling hopelessness.   Past Medical History  Diagnosis Date  . 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  Marland Kitchen HTN (hypertension)    . GERD (gastroesophageal reflux disease)   . Allergy     pcns  . Diverticula of colon 03/10/12    multiple rectosigmoid colonic diverticula/ ct abd/pelvis  . Chronic kidney disease     right renal mass 7.3x6.7x6.8cm renal cell ca  . Cancer 02/17/12 bx    left tonsil squamous cell ca,HPV positive,,spread to left cervical node  . Renal mass 03/30/2012    Past Surgical History  Procedure Date  . Wisdom tooth extraction   . Direct laryngoscopy     S/P DL, Biopsy-Dr. Pollyann Kennedy. Pathology postive for SCCa of Left Tonsil  . Gastrostomy tube placement 03/02/12  . Multiple tooth extractions 03/04/12    with DR. Mohorn  . Peg tube placement   . Tonsil biopsy 02/17/12    SCCa left tomnsil,HPV positive,spread to l cervical node    Current Outpatient Prescriptions  Medication Sig Dispense Refill  . chlorproMAZINE (THORAZINE) 25 MG tablet Take 1 tablet (25 mg total) by mouth 3 (three) times daily as needed.  30 tablet  0  . HYDROcodone-acetaminophen (LORTAB) 7.5-500 MG/15ML solution Take 15 mLs by mouth every 6 (six) hours as needed for pain.  500 mL  0  . LORazepam (ATIVAN) 0.5 MG tablet Take 1 tablet (0.5 mg total) by mouth every 6 (six) hours as needed (Nausea or vomiting).  30 tablet  0  . Nutritional Supplements (FEEDING SUPPLEMENT, JEVITY 1.2 CAL,) LIQD Begin 50/50 mixture of Jevity 1.2 and Osmolite 1.2 at 40 ml/hr continuous feeding.  Increase 10 ml q 4 hours to goal of 120 ml/hr and run for 16 hours daily as tolerated.  Flush tube with 240 ml free water before continuous feedings &  60 ml free water after continuous feedings. Drink or flush tube with an additional 240 ml free water BID daily.  Send feeding pump & RN for teaching.  DO NOT SEND FORMULA.  PATIENT IS USING SAMPLES PROVIDED FOR HIM.  1896 mL  0  . ondansetron (ZOFRAN) 8 MG tablet Take 1 tablet two times a day as needed for nausea or vomiting starting on the third day after chemotherapy.  30 tablet  1  . prochlorperazine (COMPAZINE) 10 MG  tablet Take 1 tablet (10 mg total) by mouth every 6 (six) hours as needed (Nausea or vomiting).  30 tablet  1  . sodium fluoride (FLUORISHIELD) 1.1 % GEL dental gel Brush and floss, and then instill one drop of gel per tooth space of fluoride tray. Place over teeth for 5 minutes. Remove. Spit out excess. Repeat nightly.  120 mL  prn    ALLERGIES:  is allergic to penicillins.  REVIEW OF SYSTEMS:  The rest of the 14-point review of system was negative.   Filed Vitals:   04/17/12 1546  BP: 135/86  Pulse: 76  Temp: 98.3 F (36.8 C)  Resp: 18   Wt Readings from Last 3 Encounters:  04/17/12 186 lb 4.8 oz (84.505 kg)  04/10/12 191 lb 14.4 oz (87.045 kg)  04/09/12 193 lb 11.2 oz (87.862 kg)   ECOG Performance status: 0-1  PHYSICAL EXAMINATION:   General: well-nourished man in no acute distress. Eyes: no scleral icterus. ENT: There were no oropharyngeal lesions on my unaided exam. His left tonsil area appeared normal. Neck was without thyromegaly. Lymphatics: Negative supraclavicular or axillary adenopathy. Positive for a large about 4cm left level II node. Respiratory: lungs were clear bilaterally without wheezing or crackles. Cardiovascular: Regular rate and rhythm, S1/S2, without murmur, rub or gallop. There was no pedal edema. GI: abdomen was soft, flat, nontender, nondistended, without organomegaly. PEG tube was dry, clean, intact. Muscoloskeletal: no spinal tenderness of palpation of vertebral spine. Skin exam was without echymosis, petichae. Neuro exam was nonfocal. Patient was able to get on and off exam table without assistance. Gait was normal. Patient was alerted and oriented. Attention was good. Language was appropriate. Mood was normal without depression. Speech was not pressured. Thought content was not tangential.    LABORATORY/RADIOLOGY DATA:  Lab Results  Component Value Date   WBC 6.1 04/17/2012   HGB 14.7 04/17/2012   HCT 42.4 04/17/2012   PLT 242 04/17/2012   GLUCOSE 155*  04/17/2012   ALKPHOS 71 04/17/2012   ALT 18 04/17/2012   AST 11 04/17/2012   NA 134* 04/17/2012   K 4.0 04/17/2012   CL 97 04/17/2012   CREATININE 1.01 04/17/2012   BUN 18 04/17/2012   CO2 30 04/17/2012   INR 0.94 03/02/2012    ASSESSMENT AND PLAN:   1. Right renal mass; positive on PET scan. To be addressed post treatment for head/neck cancer.   2. Left tonsil squamous cell carcinoma:  - He is on weekly cisplatin/XRT. Due for week 4 of treatment on 04/20/12. - He has grade 1-2 nausea/vomiting.  I advised him to continue to rotate through Ativan/Compazine/Zofran regularly. Continue Aloxi and Emend as pre-medications.  - Hydration: I advised him to continue at least 2 liter (or 60 oz) of fluid everyday to maintain normal kidney function.   3. Mucositis prevention: I advised him to continue with mouth rinses; salt/baking soda, diluted peroxide, and Biotene. H emay use Lortab elixir as needed got pain.   4. Calorie/protein:  I advised him to keep oral intake to maintain his weight. On feeding pump with goal rate of 120 cc/hr. I advised him to continue with swallowing exercise to decrease risk of esophageal stricture.   5. Hiccups: Thorazine as needed  6. Follow up: In 1 week.    The length of time of the face-to-face encounter was 15  minutes. More than 50% of time was spent counseling and coordination of care.

## 2012-04-17 NOTE — Progress Notes (Signed)
  Radiation Oncology         [phone] ________________________________  Name: Bobby Gonzalez MRN: [id number]  Date: 04/17/2012  DOB: 04/23/1966  Weekly Radiation Therapy Management  Current Dose: 31.8 Gy     Planned Dose:  69.96 Gy  Narrative . . . . . . . . The patient presents for routine under treatment assessment.                                                      The patient has sore throat that responds to hydrocodone.                                  He started continuous night time tube feeds two nights ago                                 Set-up films were reviewed.                                 The chart was checked. Physical Findings. . . He is in some discomfort. Filed Vitals:   04/17/12 1706  BP: 127/83  Pulse: 72  Temp: 98.2 F (36.8 C)  Weight has continued to trend down.  No significant changes. His neck shows mild erythema with a visible left upper neck node that he thinks is smaller/softening.  Oropharynx has Grade 3 mucositis without thrush Impression . . . . . . . The patient is  tolerating radiation. Plan . . . . . . . . . . . . Continue treatment as planned.  Added magic mouthwash.  Talked about duragesic patch in the next few weeks if needed.  ________________________________  Artist Pais Kathrynn Running, M.D.

## 2012-04-20 ENCOUNTER — Ambulatory Visit: Payer: BC Managed Care – PPO | Admitting: Nutrition

## 2012-04-20 ENCOUNTER — Ambulatory Visit
Admission: RE | Admit: 2012-04-20 | Discharge: 2012-04-20 | Disposition: A | Discharge status: Home / Self Care | Payer: BC Managed Care – PPO | Source: Ambulatory Visit | Attending: Radiation Oncology | Admitting: Radiation Oncology

## 2012-04-20 ENCOUNTER — Ambulatory Visit (HOSPITAL_BASED_OUTPATIENT_CLINIC_OR_DEPARTMENT_OTHER): Payer: BC Managed Care – PPO

## 2012-04-20 ENCOUNTER — Other Ambulatory Visit: Payer: Self-pay | Admitting: Lab

## 2012-04-20 VITALS — BP 121/79 | HR 80 | Temp 98.2°F | Resp 20

## 2012-04-20 DIAGNOSIS — Z5111 Encounter for antineoplastic chemotherapy: Secondary | ICD-10-CM

## 2012-04-20 DIAGNOSIS — R112 Nausea with vomiting, unspecified: Secondary | ICD-10-CM

## 2012-04-20 DIAGNOSIS — C099 Malignant neoplasm of tonsil, unspecified: Secondary | ICD-10-CM

## 2012-04-20 MED ORDER — SODIUM CHLORIDE 0.9 % IV SOLN
40.0000 mg/m2 | Freq: Once | INTRAVENOUS | Status: AC
  Administered 2012-04-20: 84 mg via INTRAVENOUS
  Filled 2012-04-20: qty 84

## 2012-04-20 MED ORDER — SODIUM CHLORIDE 0.9 % IV SOLN
Freq: Once | INTRAVENOUS | Status: AC
  Administered 2012-04-20: 09:00:00 via INTRAVENOUS

## 2012-04-20 MED ORDER — POTASSIUM CHLORIDE 2 MEQ/ML IV SOLN
Freq: Once | INTRAVENOUS | Status: AC
  Administered 2012-04-20: 10:00:00 via INTRAVENOUS
  Filled 2012-04-20: qty 10

## 2012-04-20 MED ORDER — DEXAMETHASONE SODIUM PHOSPHATE 4 MG/ML IJ SOLN
12.0000 mg | Freq: Once | INTRAMUSCULAR | Status: AC
  Administered 2012-04-20: 12 mg via INTRAVENOUS

## 2012-04-20 MED ORDER — DEXAMETHASONE SODIUM PHOSPHATE 10 MG/ML IJ SOLN: 10.0000 mg | Freq: Once | INTRAMUSCULAR | Status: DC

## 2012-04-20 MED ORDER — SODIUM CHLORIDE 0.9 % IV SOLN
150.0000 mg | Freq: Once | INTRAVENOUS | Status: AC
  Administered 2012-04-20: 150 mg via INTRAVENOUS
  Filled 2012-04-20: qty 5

## 2012-04-20 MED ORDER — PALONOSETRON HCL INJECTION 0.25 MG/5ML
0.2500 mg | Freq: Once | INTRAVENOUS | Status: AC
  Administered 2012-04-20: 0.25 mg via INTRAVENOUS

## 2012-04-20 NOTE — Progress Notes (Signed)
I visited with the patient and wife during chemotherapy.  The patient reports that he has been trying to eat and has tolerated soft foods such as burritos, refried beans, chicken noodle soup and pudding.  He has consumed Boost Plus by mouth.  His wife makes smoothies for him, some of which he likes and some of which he does not.  He has not tolerated continuous tube feedings very well.  The patient's wife reports that the patient does not sleep well and they would like to explore options of converting to bolus feedings.  She also describes the patient is having diarrhea after using a combination of Osmolite 1.2 and Jevity 1.2. Weight continues to decline and was documented as 186 pounds August 16 from 191.9 pounds August 9.  NUTRITION DIAGNOSIS:  Inadequate oral intake continues.    New diagnosis of inadequate enteral nutrition infusion related to intolerance of enteral nutrition as evidenced by patient's family verbalization that tube feeding was not tolerated.  INTERVENTION:  I have provided Mr. and Bobby Gonzalez with alternatives for tube feedings.  They would prefer to switch to Boost Plus via his tube. The patient is self-pay so he will have to purchase over-the-counter as I do not have supplements I can give him for this.  I have educated them on strategies for bolus tube feedings using Boost Plus encouraging 1 can q.i.d. as tolerated between meals.  The patient and wife verbalize understanding.  MONITORING/EVALUATION/GOALS:  The patient will tolerate Boost Plus via feeding tube as well as oral diet to meet 100% of his needs to minimize further weight loss.  NEXT VISIT:  Monday, August 26 during chemotherapy.   ______________________________ Zenovia Jarred, RD, CSO, LDN Clinical Nutrition Specialist BN/MEDQ  D:  04/20/2012  T:  04/20/2012  Job:  [id number]

## 2012-04-20 NOTE — Progress Notes (Signed)
Total urine output post-Cisplatin 262ml-dhp, rn

## 2012-04-20 NOTE — Patient Instructions (Signed)
[facility] Discharge Instructions for Patients Receiving Chemotherapy  Today you received the following chemotherapy agents Cisplatin To help prevent nausea and vomiting after your treatment, we encourage you to take your nausea medication as prescribed.  If you develop nausea and vomiting that is not controlled by your nausea medication, call the clinic. If it is after clinic hours your family physician or the after hours number for the clinic or go to the Emergency Department.   BELOW ARE SYMPTOMS THAT SHOULD BE REPORTED IMMEDIATELY:  *FEVER GREATER THAN 100.5 F  *CHILLS WITH OR WITHOUT FEVER  NAUSEA AND VOMITING THAT IS NOT CONTROLLED WITH YOUR NAUSEA MEDICATION  *UNUSUAL SHORTNESS OF BREATH  *UNUSUAL BRUISING OR BLEEDING  TENDERNESS IN MOUTH AND THROAT WITH OR WITHOUT PRESENCE OF ULCERS  *URINARY PROBLEMS  *BOWEL PROBLEMS  UNUSUAL RASH Items with * indicate a potential emergency and should be followed up as soon as possible.  One of the nurses will contact you 24 hours after your treatment. Please let the nurse know about any problems that you may have experienced. Feel free to call the clinic you have any questions or concerns. The clinic phone number is [phone].   I have been informed and understand all the instructions given to me. I know to contact the clinic, my physician, or go to the Emergency Department if any problems should occur. I do not have any questions at this time, but understand that I may call the clinic during office hours   should I have any questions or need assistance in obtaining follow up care.    __________________________________________  _____________  __________ Signature of Patient or Authorized Representative            Date                   Time    __________________________________________ Nurse's Signature    

## 2012-04-20 NOTE — Progress Notes (Signed)
Pt's total urine output pre-Cisplatin 361ml-dhp, rn Total urine output post-Cisplatin

## 2012-04-21 ENCOUNTER — Ambulatory Visit
Admission: RE | Admit: 2012-04-21 | Discharge: 2012-04-21 | Disposition: A | Discharge status: Home / Self Care | Payer: BC Managed Care – PPO | Source: Ambulatory Visit | Attending: Radiation Oncology | Admitting: Radiation Oncology

## 2012-04-21 DIAGNOSIS — C099 Malignant neoplasm of tonsil, unspecified: Secondary | ICD-10-CM

## 2012-04-21 MED ORDER — OMEPRAZOLE 20 MG PO CPDR: 20.0000 mg | DELAYED_RELEASE_CAPSULE | Freq: Every day | ORAL | Status: DC

## 2012-04-21 MED ORDER — SUCRALFATE 1 G PO TABS: ORAL_TABLET | ORAL | Status: DC

## 2012-04-21 NOTE — Progress Notes (Signed)
Patient brought to nursing ,patient stated he got a chill when he got off elevator and when he went to get rad tx, the machine stopped, they brought him to  Dressing room, then came back when tomo came back up, they asked if he still wanted treatment, he looked puny, patient was treated his request,then sent to nursing for vitals,took sitting vitals= t=97.0,b/p=136/86, p=69, rr=20, standing  B/p=137/84, p=70, no c/o pain, or nausea, stated he ate [employer] hot cake breakfast with eggs,buiscuit,sausage, 2 38 ounce glass of water today and some chicken salad, ,doesn't feel need to see MD, says reflux med called in to [facility] today,going to go pick up now, encouraged to wear warmer clothes, not shorts, this floor has to stay cold because of the radiation machines,verbal understanding, also informed patient and wife if he feels any worse come back tomorrow otherwise we would see him Thursday after treatment with MD 4:58 PM

## 2012-04-22 ENCOUNTER — Ambulatory Visit: Payer: BC Managed Care – PPO

## 2012-04-23 ENCOUNTER — Telehealth: Payer: Self-pay | Admitting: *Deleted

## 2012-04-23 ENCOUNTER — Ambulatory Visit
Admission: RE | Admit: 2012-04-23 | Discharge: 2012-04-23 | Disposition: A | Discharge status: Home / Self Care | Payer: BC Managed Care – PPO | Source: Ambulatory Visit | Attending: Radiation Oncology | Admitting: Radiation Oncology

## 2012-04-23 ENCOUNTER — Encounter: Payer: Self-pay | Admitting: Radiation Oncology

## 2012-04-23 VITALS — BP 124/85 | HR 77 | Temp 98.5°F | Resp 18 | Wt 183.2 lb

## 2012-04-23 DIAGNOSIS — C099 Malignant neoplasm of tonsil, unspecified: Secondary | ICD-10-CM

## 2012-04-23 MED ORDER — LORAZEPAM 0.5 MG PO TABS: 0.5000 mg | ORAL_TABLET | Freq: Four times a day (QID) | ORAL | Status: DC | PRN

## 2012-04-23 NOTE — Progress Notes (Signed)
Patient presents to the clinic today accompanied by his wife for a PUT with Dr. Mitzi Hansen. Patient is alert and oriented to person, place, and time. No distress noted. Steady gait noted. Pleasant affect noted. Patient denies pain at this time but, reports taking hydrocodone prior to visit. Patient's wife reports that this week has been a much better week. Patient reports once episode of nausea and vomiting but, reports it is related to laying down to soon following bolus feeding. Patient reports that he continues to eat by mouth. Also, patient is attempting to build up to four bolus feedings a day. Patient reports reflux but has been given a script for prilosec. Patient reports dry mouth, but is using biotene rinse and gum. Patient reports thick stick saliva for which he uses hydrogen peroxide mixture. Patient denies ulcerations of the mouth. Patient reports using Biafine on his treatment area each day. Only faint hyperpigmentation of left jaw and neck noted. Reported all findings to Dr. Mitzi Hansen.

## 2012-04-23 NOTE — Progress Notes (Signed)
Department of Radiation Oncology  Phone:  [phone] Fax:        [phone]  Weekly Treatment Note    Name: Bobby Gonzalez Date: 04/23/2012 MRN: [id number] DOB: 02-Jul-1966   Current dose: 38.16 Gy  Current fraction: 18   MEDICATIONS: Current Outpatient Prescriptions  Medication Sig Dispense Refill  . Alum & Mag Hydroxide-Simeth (MAGIC MOUTHWASH W/LIDOCAINE) SOLN Take 5 mLs by mouth 4 (four) times daily as needed.  500 mL  2  . chlorproMAZINE (THORAZINE) 25 MG tablet Take 1 tablet (25 mg total) by mouth 3 (three) times daily as needed.  30 tablet  0  . hydrocodone-acetaminophen (HYCET) 7.5-325 MG/15ML solution       . LORazepam (ATIVAN) 0.5 MG tablet Take 1 tablet (0.5 mg total) by mouth every 6 (six) hours as needed (Nausea or vomiting).  30 tablet  0  . Nutritional Supplements (FEEDING SUPPLEMENT, JEVITY 1.2 CAL,) LIQD Begin 50/50 mixture of Jevity 1.2 and Osmolite 1.2 at 40 ml/hr continuous feeding.  Increase 10 ml q 4 hours to goal of 120 ml/hr and run for 16 hours daily as tolerated.  Flush tube with 240 ml free water before continuous feedings & 60 ml free water after continuous feedings. Drink or flush tube with an additional 240 ml free water BID daily.  Send feeding pump & RN for teaching.  DO NOT SEND FORMULA.  PATIENT IS USING SAMPLES PROVIDED FOR HIM.  1896 mL  0  . omeprazole (PRILOSEC) 20 MG capsule Take 1 capsule (20 mg total) by mouth daily.  30 capsule  2  . ondansetron (ZOFRAN) 8 MG tablet Take 1 tablet two times a day as needed for nausea or vomiting starting on the third day after chemotherapy.  30 tablet  1  . prochlorperazine (COMPAZINE) 10 MG tablet Take 1 tablet (10 mg total) by mouth every 6 (six) hours as needed (Nausea or vomiting).  30 tablet  1  . sodium fluoride (FLUORISHIELD) 1.1 % GEL dental gel Brush and floss, and then instill one drop of gel per tooth space of fluoride tray. Place over teeth for 5 minutes. Remove. Spit out excess. Repeat nightly.   120 mL  prn  . sucralfate (CARAFATE) 1 G tablet Take 1 tablets four times a day 1 hour before meals, and every night at bedtime.  120 tablet  2     ALLERGIES: Penicillins   LABORATORY DATA:  Lab Results  Component Value Date   WBC 6.1 04/17/2012   HGB 14.7 04/17/2012   HCT 42.4 04/17/2012   MCV 89.4 04/17/2012   PLT 242 04/17/2012   Lab Results  Component Value Date   NA 134* 04/17/2012   K 4.0 04/17/2012   CL 97 04/17/2012   CO2 30 04/17/2012   Lab Results  Component Value Date   ALT 18 04/17/2012   AST 11 04/17/2012   ALKPHOS 71 04/17/2012   BILITOT 0.7 04/17/2012     NARRATIVE: Bobby Gonzalez was seen today for weekly treatment management. The chart was checked and the patient's films were reviewed. The patient is doing better this week. He has had a little bit of nausea but this is much improved. He really denies major irritation or pain at this point. He is noticing thick saliva and is using hydrogen peroxide mixed your to help with this.  PHYSICAL EXAMINATION: weight is 183 lb 3.2 oz (83.099 kg). His oral temperature is 98.5 F (36.9 C). His blood pressure is 124/85 and his pulse is  77. His respiration is 18.      patient's skin looks good. Hyperpigmentation. No real dry desquamation at this point. Mucositis emerging in the posterior oral cavity, the anterior aspect looks quite good at this point.  ASSESSMENT: The patient is doing satisfactorily with treatment.  PLAN: We will continue with the patient's radiation treatment as planned. Please with how he is doing.

## 2012-04-23 NOTE — Telephone Encounter (Signed)
Called ativan into [facility] per Dr. Gaylyn Rong..  Called wife and notified her of med called to pharmacy and she verbalized understanding.

## 2012-04-23 NOTE — Telephone Encounter (Signed)
Wife called requests refill on pt's ativan be called to [facility].

## 2012-04-24 ENCOUNTER — Telehealth: Payer: Self-pay | Admitting: Oncology

## 2012-04-24 ENCOUNTER — Other Ambulatory Visit (HOSPITAL_BASED_OUTPATIENT_CLINIC_OR_DEPARTMENT_OTHER): Payer: BC Managed Care – PPO | Admitting: Lab

## 2012-04-24 ENCOUNTER — Ambulatory Visit (HOSPITAL_BASED_OUTPATIENT_CLINIC_OR_DEPARTMENT_OTHER): Payer: BC Managed Care – PPO | Admitting: Oncology

## 2012-04-24 ENCOUNTER — Ambulatory Visit
Admission: RE | Admit: 2012-04-24 | Discharge: 2012-04-24 | Disposition: A | Discharge status: Home / Self Care | Payer: BC Managed Care – PPO | Source: Ambulatory Visit | Attending: Radiation Oncology | Admitting: Radiation Oncology

## 2012-04-24 VITALS — BP 116/85 | HR 74 | Temp 99.0°F | Resp 20 | Ht 70.0 in | Wt 181.5 lb

## 2012-04-24 DIAGNOSIS — IMO0001 Reserved for inherently not codable concepts without codable children: Secondary | ICD-10-CM

## 2012-04-24 DIAGNOSIS — E440 Moderate protein-calorie malnutrition: Secondary | ICD-10-CM

## 2012-04-24 DIAGNOSIS — C099 Malignant neoplasm of tonsil, unspecified: Secondary | ICD-10-CM

## 2012-04-24 DIAGNOSIS — B977 Papillomavirus as the cause of diseases classified elsewhere: Secondary | ICD-10-CM

## 2012-04-24 DIAGNOSIS — N289 Disorder of kidney and ureter, unspecified: Secondary | ICD-10-CM

## 2012-04-24 DIAGNOSIS — K1230 Oral mucositis (ulcerative), unspecified: Secondary | ICD-10-CM

## 2012-04-24 LAB — COMPREHENSIVE METABOLIC PANEL
ALT: 19 U/L (ref 0–53)
AST: 12 U/L (ref 0–37)
Albumin: 4.1 g/dL (ref 3.5–5.2)
Alkaline Phosphatase: 65 U/L (ref 39–117)
BUN: 21 mg/dL (ref 6–23)
CO2: 29 mEq/L (ref 19–32)
Calcium: 9.2 mg/dL (ref 8.4–10.5)
Chloride: 101 mEq/L (ref 96–112)
Creatinine, Ser: 0.89 mg/dL (ref 0.50–1.35)
Glucose, Bld: 94 mg/dL (ref 70–99)
Potassium: 4.3 mEq/L (ref 3.5–5.3)
Sodium: 138 mEq/L (ref 135–145)
Total Bilirubin: 0.7 mg/dL (ref 0.3–1.2)
Total Protein: 7 g/dL (ref 6.0–8.3)

## 2012-04-24 LAB — CBC WITH DIFFERENTIAL/PLATELET
BASO%: 0.2 % (ref 0.0–2.0)
Basophils Absolute: 0 10*3/uL (ref 0.0–0.1)
EOS%: 0.4 % (ref 0.0–7.0)
Eosinophils Absolute: 0 10*3/uL (ref 0.0–0.5)
HCT: 41.7 % (ref 38.4–49.9)
HGB: 14.6 g/dL (ref 13.0–17.1)
LYMPH%: 4.2 % — ABNORMAL LOW (ref 14.0–49.0)
MCH: 31.3 pg (ref 27.2–33.4)
MCHC: 35.1 g/dL (ref 32.0–36.0)
MCV: 89.1 fL (ref 79.3–98.0)
MONO#: 0.6 10*3/uL (ref 0.1–0.9)
MONO%: 13 % (ref 0.0–14.0)
NEUT#: 3.8 10*3/uL (ref 1.5–6.5)
NEUT%: 82.2 % — ABNORMAL HIGH (ref 39.0–75.0)
Platelets: 208 10*3/uL (ref 140–400)
RBC: 4.67 10*6/uL (ref 4.20–5.82)
RDW: 13 % (ref 11.0–14.6)
WBC: 4.7 10*3/uL (ref 4.0–10.3)
lymph#: 0.2 10*3/uL — ABNORMAL LOW (ref 0.9–3.3)

## 2012-04-24 NOTE — Telephone Encounter (Signed)
appts made and printed for pt  °

## 2012-04-25 MED ORDER — SUCRALFATE 1 GM/10ML PO SUSP: 1.0000 g | Freq: Four times a day (QID) | ORAL | Status: AC

## 2012-04-26 NOTE — Progress Notes (Signed)
[facility]  Telephone:(336) [phone] Fax:([phone]   OFFICE PROGRESS NOTE   Cc:  [name], MD   DIAGNOSIS:  1. 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 Right renal mass with positive PET scan; pending evaluation and workup.  2. Right renal mass presumed to be renal cell carcinoma.   CURRENT THERAPY:  1. Due to start concurrent chemo radiation today 03/30/2012 with daily XRT and weekly Cisplatin.  2. Pending nephrectomy.   INTERVAL HISTORY: Bobby Gonzalez 46 y.o. male returns for regular follow up with his wife.  He reported increasing fatigue.  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   He has been quite sensitive to noise/perfume/taste.  He has some ringing in the ear but no hearing loss.  He has mucositis and has not been able to eat by mouth except for sip of liquid.  He has been using his PEG tube for nutrition. His nausea/vomiting has been better controlled with stronger antiemetics pre med.   His mucositis is moderate; he takes Lortab elixir about 2-3 times daily.  He reported adherence to swallowing exercises.  He alternates salt/baking soda mouth rinse along with diluted hydrogen peroxide to get out the thick phlegm.  His wife reported that he has been feeling depressed having to go through cancer treatment.  He has noticed some shrinkage of the left cervical neck node.  He denied any skin rash or burn in the neck from radiation.   Patient denies fever,  headache, visual changes, confusion, drenching night sweats, odynophagia, dysphagia, nausea vomiting, jaundice, chest pain, palpitation, shortness of breath, dyspnea on exertion, productive cough, gum bleeding, epistaxis, hematemesis, hemoptysis, abdominal pain, abdominal swelling, early satiety, melena, hematochezia, hematuria, skin rash, spontaneous bleeding, joint swelling, joint pain, heat or cold  intolerance, bowel bladder incontinence, back pain, focal motor weakness, paresthesia, depression, suicidal or homicidal ideation, feeling hopelessness.   Past Medical History  Diagnosis Date  . 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  Marland Kitchen HTN (hypertension)   . GERD (gastroesophageal reflux disease)   . Allergy     pcns  . Diverticula of colon 03/10/12    multiple rectosigmoid colonic diverticula/ ct abd/pelvis  . Chronic kidney disease     right renal mass 7.3x6.7x6.8cm renal cell ca  . Cancer 02/17/12 bx    left tonsil squamous cell ca,HPV positive,,spread to left cervical node  . Renal mass 03/30/2012    Past Surgical History  Procedure Date  . Wisdom tooth extraction   . Direct laryngoscopy     S/P DL, Biopsy-Dr. Pollyann Kennedy. Pathology postive for SCCa of Left Tonsil  . Gastrostomy tube placement 03/02/12  . Multiple tooth extractions 03/04/12    with DR. Mohorn  . Peg tube placement   . Tonsil biopsy 02/17/12    SCCa left tomnsil,HPV positive,spread to l cervical node    Current Outpatient Prescriptions  Medication Sig Dispense Refill  . Alum & Mag Hydroxide-Simeth (MAGIC MOUTHWASH W/LIDOCAINE) SOLN Take 5 mLs by mouth 4 (four) times daily as needed.  500 mL  2  . chlorproMAZINE (THORAZINE) 25 MG tablet Take 1 tablet (25 mg total) by mouth 3 (three) times daily as needed.  30 tablet  0  . hydrocodone-acetaminophen (HYCET) 7.5-325 MG/15ML solution       . LORazepam (ATIVAN) 0.5 MG tablet Take 1 tablet (0.5 mg total) by mouth  every 6 (six) hours as needed (Nausea or vomiting).  30 tablet  0  . Nutritional Supplements (FEEDING SUPPLEMENT, JEVITY 1.2 CAL,) LIQD Begin 50/50 mixture of Jevity 1.2 and Osmolite 1.2 at 40 ml/hr continuous feeding.  Increase 10 ml q 4 hours to goal of 120 ml/hr and run for 16 hours daily as tolerated.  Flush tube with 240 ml free water before continuous feedings & 60 ml free water after continuous feedings. Drink or flush tube with an  additional 240 ml free water BID daily.  Send feeding pump & RN for teaching.  DO NOT SEND FORMULA.  PATIENT IS USING SAMPLES PROVIDED FOR HIM.  1896 mL  0  . omeprazole (PRILOSEC) 20 MG capsule Take 1 capsule (20 mg total) by mouth daily.  30 capsule  2  . ondansetron (ZOFRAN) 8 MG tablet Take 1 tablet two times a day as needed for nausea or vomiting starting on the third day after chemotherapy.  30 tablet  1  . prochlorperazine (COMPAZINE) 10 MG tablet Take 1 tablet (10 mg total) by mouth every 6 (six) hours as needed (Nausea or vomiting).  30 tablet  1  . sodium fluoride (FLUORISHIELD) 1.1 % GEL dental gel Brush and floss, and then instill one drop of gel per tooth space of fluoride tray. Place over teeth for 5 minutes. Remove. Spit out excess. Repeat nightly.  120 mL  prn  . sucralfate (CARAFATE) 1 GM/10ML suspension Take 10 mLs (1 g total) by mouth 4 (four) times daily.  420 mL  0    ALLERGIES:  is allergic to penicillins.  REVIEW OF SYSTEMS:  The rest of the 14-point review of system was negative.   Filed Vitals:   04/24/12 1450  BP: 116/85  Pulse: 74  Temp: 99 F (37.2 C)  Resp: 20   Wt Readings from Last 3 Encounters:  04/24/12 181 lb 8 oz (82.328 kg)  04/23/12 183 lb 3.2 oz (83.099 kg)  04/21/12 184 lb 3.2 oz (83.553 kg)   ECOG Performance status: 1  PHYSICAL EXAMINATION:  General:  well-nourished man, in no acute distress.  Eyes:  no scleral icterus.  ENT:  There were no oropharyngeal lesions.  Neck was without thyromegaly.  Lymphatics:  Positive for a 2cm left level II/III cercical node. There was no supraclavicular or axillary adenopathy.  Respiratory: lungs were clear bilaterally without wheezing or crackles.  Cardiovascular:  Regular rate and rhythm, S1/S2, without murmur, rub or gallop.  There was no pedal edema.  GI:  abdomen was soft, flat, nontender, nondistended, without organomegaly.  Muscoloskeletal:  no spinal tenderness of palpation of vertebral spine.  Skin exam  was without echymosis, petichae.  Neuro exam was nonfocal.  Patient was able to get on and off exam table without assistance.  Gait was normal.  Patient was alerted and oriented.  Attention was good.   Language was appropriate.  Mood was normal without depression.  Speech was not pressured.  Thought content was not tangential.     LABORATORY/RADIOLOGY DATA:  Lab Results  Component Value Date   WBC 4.7 04/24/2012   HGB 14.6 04/24/2012   HCT 41.7 04/24/2012   PLT 208 04/24/2012   GLUCOSE 94 04/24/2012   ALKPHOS 65 04/24/2012   ALT 19 04/24/2012   AST 12 04/24/2012   NA 138 04/24/2012   K 4.3 04/24/2012   CL 101 04/24/2012   CREATININE 0.89 04/24/2012   BUN 21 04/24/2012   CO2 29 04/24/2012   INR  0.94 03/02/2012    ASSESSMENT AND PLAN:   1. Right renal mass; positive on PEG scan. To be addressed post treatment for head/neck cancer.   2. Left tonsil squamous cell carcinoma:  - He is on weekly cisplatin and daily XRT.  - He now has grade 2 fatigue; grade 1 nausea/vomiting; grade 1 tinnitus, grade 2 mucositis/xerostomia.  None of these requires dose modification yet.  I encouraged him to continue with chemo next week.  He expressed informed understanding and agreed to proceed.   3.  Mucositis/xerostomia:  Continue on Lortab elixir.  If he requires Lortab more than 3 times next week, we may consider adding on Fentanyl patch for long acting.  I advised him to continue mouth rinses.   4.  Moderate Calorie/protein: He is on PEG tube feeding per Nutritionist.    5.  Depression:  Could also due to adjustment disorder.  I offered antidepressant and referral to Child psychotherapist.  He declined all these for now.   6.  Follow up:  In one week.        The length of time of the face-to-face encounter was 15 minutes. More than 50% of time was spent counseling and coordination of care.

## 2012-04-27 ENCOUNTER — Ambulatory Visit (HOSPITAL_BASED_OUTPATIENT_CLINIC_OR_DEPARTMENT_OTHER): Payer: BC Managed Care – PPO

## 2012-04-27 ENCOUNTER — Ambulatory Visit: Payer: BC Managed Care – PPO | Admitting: Nutrition

## 2012-04-27 ENCOUNTER — Other Ambulatory Visit: Payer: Self-pay | Admitting: Lab

## 2012-04-27 ENCOUNTER — Ambulatory Visit
Admission: RE | Admit: 2012-04-27 | Discharge: 2012-04-27 | Disposition: A | Discharge status: Home / Self Care | Payer: BC Managed Care – PPO | Source: Ambulatory Visit | Attending: Radiation Oncology | Admitting: Radiation Oncology

## 2012-04-27 VITALS — BP 114/79 | HR 81 | Temp 98.7°F | Resp 18

## 2012-04-27 DIAGNOSIS — C099 Malignant neoplasm of tonsil, unspecified: Secondary | ICD-10-CM

## 2012-04-27 DIAGNOSIS — Z5111 Encounter for antineoplastic chemotherapy: Secondary | ICD-10-CM

## 2012-04-27 MED ORDER — PALONOSETRON HCL INJECTION 0.25 MG/5ML
0.2500 mg | Freq: Once | INTRAVENOUS | Status: AC
  Administered 2012-04-27: 0.25 mg via INTRAVENOUS

## 2012-04-27 MED ORDER — SODIUM CHLORIDE 0.9 % IV SOLN
150.0000 mg | Freq: Once | INTRAVENOUS | Status: AC
  Administered 2012-04-27: 150 mg via INTRAVENOUS
  Filled 2012-04-27: qty 5

## 2012-04-27 MED ORDER — DEXAMETHASONE SODIUM PHOSPHATE 10 MG/ML IJ SOLN
10.0000 mg | Freq: Once | INTRAMUSCULAR | Status: AC
  Administered 2012-04-27: 10 mg via INTRAVENOUS

## 2012-04-27 MED ORDER — POTASSIUM CHLORIDE 2 MEQ/ML IV SOLN
Freq: Once | INTRAVENOUS | Status: AC
  Administered 2012-04-27: 10:00:00 via INTRAVENOUS
  Filled 2012-04-27: qty 10

## 2012-04-27 MED ORDER — SODIUM CHLORIDE 0.9 % IV SOLN
40.0000 mg/m2 | Freq: Once | INTRAVENOUS | Status: AC
  Administered 2012-04-27: 84 mg via INTRAVENOUS
  Filled 2012-04-27: qty 84

## 2012-04-27 MED ORDER — SODIUM CHLORIDE 0.9 % IV SOLN
Freq: Once | INTRAVENOUS | Status: AC
  Administered 2012-04-27: 10:00:00 via INTRAVENOUS

## 2012-04-27 NOTE — Patient Instructions (Signed)
[facility] Discharge Instructions for Patients Receiving Chemotherapy  Today you received the following chemotherapy agents Cisplatin To help prevent nausea and vomiting after your treatment, we encourage you to take your nausea medication as prescribed.  If you develop nausea and vomiting that is not controlled by your nausea medication, call the clinic. If it is after clinic hours your family physician or the after hours number for the clinic or go to the Emergency Department.   BELOW ARE SYMPTOMS THAT SHOULD BE REPORTED IMMEDIATELY:  *FEVER GREATER THAN 100.5 F  *CHILLS WITH OR WITHOUT FEVER  NAUSEA AND VOMITING THAT IS NOT CONTROLLED WITH YOUR NAUSEA MEDICATION  *UNUSUAL SHORTNESS OF BREATH  *UNUSUAL BRUISING OR BLEEDING  TENDERNESS IN MOUTH AND THROAT WITH OR WITHOUT PRESENCE OF ULCERS  *URINARY PROBLEMS  *BOWEL PROBLEMS  UNUSUAL RASH Items with * indicate a potential emergency and should be followed up as soon as possible.  One of the nurses will contact you 24 hours after your treatment. Please let the nurse know about any problems that you may have experienced. Feel free to call the clinic you have any questions or concerns. The clinic phone number is [phone].   I have been informed and understand all the instructions given to me. I know to contact the clinic, my physician, or go to the Emergency Department if any problems should occur. I do not have any questions at this time, but understand that I may call the clinic during office hours   should I have any questions or need assistance in obtaining follow up care.    __________________________________________  _____________  __________ Signature of Patient or Authorized Representative            Date                   Time    __________________________________________ Nurse's Signature    

## 2012-04-28 ENCOUNTER — Ambulatory Visit
Admission: RE | Admit: 2012-04-28 | Discharge: 2012-04-28 | Disposition: A | Discharge status: Home / Self Care | Payer: BC Managed Care – PPO | Source: Ambulatory Visit | Attending: Radiation Oncology | Admitting: Radiation Oncology

## 2012-04-28 NOTE — Progress Notes (Signed)
Bobby Gonzalez has had issues with feeding intolerance.  He is only tolerating 3 cans of Boost Plus by tube a day.  He is trying to eat. However, everything tastes metallic and he just cannot eat secondary to taste.  He denies mouth sores.  He reports some diarrhea and definitely nausea.  NUTRITION DIAGNOSIS:  Inadequate oral intake continues.  Diagnosis of inadequate enteral nutrition infusion continues.  INTERVENTION:  I have again educated Bobby Gonzalez on the importance of attempting bolus tube feedings in small amounts to help combat nausea as well as continuing antiemetics as prescribed.  The patient was encouraged to continue to flush tube with water for adequate hydration. I have educated him again on strategies for dealing with taste alterations and encouraged colder protein foods and freezing oral nutrition supplements for him to eat throughout the day.  I have encouraged Bobby Gonzalez to use his feeding pump during the day if he prefers.  The patient verbalizes understanding and appreciation.  MONITORING/EVALUATION/GOALS:  The patient has not been able to tolerate oral nutrition support or oral intake to minimize weight loss.  NEXT VISIT:  Tuesday, September 3rd, during chemotherapy.   ______________________________ Zenovia Jarred, RD, CSO, LDN Clinical Nutrition Specialist BN/MEDQ  D:  04/27/2012  T:  04/28/2012  Job:  [id number]

## 2012-04-29 ENCOUNTER — Ambulatory Visit
Admission: RE | Admit: 2012-04-29 | Discharge: 2012-04-29 | Disposition: A | Discharge status: Home / Self Care | Payer: BC Managed Care – PPO | Source: Ambulatory Visit | Attending: Radiation Oncology | Admitting: Radiation Oncology

## 2012-04-30 ENCOUNTER — Telehealth: Payer: Self-pay | Admitting: Oncology

## 2012-04-30 ENCOUNTER — Ambulatory Visit
Admission: RE | Admit: 2012-04-30 | Discharge: 2012-04-30 | Disposition: A | Discharge status: Home / Self Care | Payer: BC Managed Care – PPO | Source: Ambulatory Visit | Attending: Radiation Oncology | Admitting: Radiation Oncology

## 2012-04-30 NOTE — Telephone Encounter (Signed)
lmonvm for pt re genetics appt for 10/21 @ 1:30pm. Pt asked to call if he cannot keep d/t or wants to r/s. Other appts remain the same.

## 2012-05-01 ENCOUNTER — Ambulatory Visit
Admission: RE | Admit: 2012-05-01 | Discharge: 2012-05-01 | Disposition: A | Discharge status: Home / Self Care | Payer: BC Managed Care – PPO | Source: Ambulatory Visit | Attending: Radiation Oncology | Admitting: Radiation Oncology

## 2012-05-01 ENCOUNTER — Encounter: Payer: Self-pay | Admitting: Radiation Oncology

## 2012-05-01 ENCOUNTER — Other Ambulatory Visit (HOSPITAL_BASED_OUTPATIENT_CLINIC_OR_DEPARTMENT_OTHER): Payer: BC Managed Care – PPO

## 2012-05-01 ENCOUNTER — Ambulatory Visit (HOSPITAL_BASED_OUTPATIENT_CLINIC_OR_DEPARTMENT_OTHER): Payer: BC Managed Care – PPO | Admitting: Oncology

## 2012-05-01 ENCOUNTER — Encounter: Payer: Self-pay | Admitting: Oncology

## 2012-05-01 VITALS — BP 119/78 | HR 79 | Temp 98.2°F | Resp 20 | Wt 176.9 lb

## 2012-05-01 VITALS — BP 119/78 | HR 79 | Temp 98.2°F | Resp 20 | Ht 70.0 in | Wt 177.0 lb

## 2012-05-01 DIAGNOSIS — B977 Papillomavirus as the cause of diseases classified elsewhere: Secondary | ICD-10-CM

## 2012-05-01 DIAGNOSIS — N289 Disorder of kidney and ureter, unspecified: Secondary | ICD-10-CM

## 2012-05-01 DIAGNOSIS — C099 Malignant neoplasm of tonsil, unspecified: Secondary | ICD-10-CM

## 2012-05-01 DIAGNOSIS — F3289 Other specified depressive episodes: Secondary | ICD-10-CM

## 2012-05-01 DIAGNOSIS — F329 Major depressive disorder, single episode, unspecified: Secondary | ICD-10-CM

## 2012-05-01 LAB — COMPREHENSIVE METABOLIC PANEL (CC13)
ALT: 28 U/L (ref 0–55)
AST: 15 U/L (ref 5–34)
Albumin: 3.8 g/dL (ref 3.5–5.0)
Alkaline Phosphatase: 69 U/L (ref 40–150)
BUN: 24 mg/dL (ref 7.0–26.0)
CO2: 29 mEq/L (ref 22–29)
Calcium: 9.3 mg/dL (ref 8.4–10.4)
Chloride: 100 mEq/L (ref 98–107)
Creatinine: 1.1 mg/dL (ref 0.7–1.3)
Glucose: 139 mg/dl — ABNORMAL HIGH (ref 70–99)
Potassium: 4.1 mEq/L (ref 3.5–5.1)
Sodium: 136 mEq/L (ref 136–145)
Total Bilirubin: 0.7 mg/dL (ref 0.20–1.20)
Total Protein: 6.6 g/dL (ref 6.4–8.3)

## 2012-05-01 LAB — CBC WITH DIFFERENTIAL/PLATELET
BASO%: 0.1 % (ref 0.0–2.0)
Basophils Absolute: 0 10*3/uL (ref 0.0–0.1)
EOS%: 0.4 % (ref 0.0–7.0)
Eosinophils Absolute: 0 10*3/uL (ref 0.0–0.5)
HCT: 39.8 % (ref 38.4–49.9)
HGB: 14 g/dL (ref 13.0–17.1)
LYMPH%: 3.7 % — ABNORMAL LOW (ref 14.0–49.0)
MCH: 31.5 pg (ref 27.2–33.4)
MCHC: 35.2 g/dL (ref 32.0–36.0)
MCV: 89.5 fL (ref 79.3–98.0)
MONO#: 0.5 10*3/uL (ref 0.1–0.9)
MONO%: 10.9 % (ref 0.0–14.0)
NEUT#: 3.5 10*3/uL (ref 1.5–6.5)
NEUT%: 84.9 % — ABNORMAL HIGH (ref 39.0–75.0)
Platelets: 160 10*3/uL (ref 140–400)
RBC: 4.45 10*6/uL (ref 4.20–5.82)
RDW: 13.1 % (ref 11.0–14.6)
WBC: 4.2 10*3/uL (ref 4.0–10.3)
lymph#: 0.2 10*3/uL — ABNORMAL LOW (ref 0.9–3.3)

## 2012-05-01 NOTE — Patient Instructions (Addendum)
Return Tuesday for your chemotherapy. We will reduce your chemotherapy dose by 25 % due to the ringing in your ears.

## 2012-05-01 NOTE — Progress Notes (Signed)
[facility]  Telephone:(336) [phone] Fax:(336) [phone]   OFFICE PROGRESS NOTE   Cc:  [name], MD   DIAGNOSIS:  1. 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 Right renal mass with positive PET scan; pending evaluation and workup.  2. Right renal mass presumed to be renal cell carcinoma.   CURRENT THERAPY:  1. Due to start concurrent chemo radiation today 03/30/2012 with daily XRT and weekly Cisplatin.  2. Pending nephrectomy.   INTERVAL HISTORY: Bobby Gonzalez 46 y.o. male returns for regular follow up with his wife.  He reported increasing fatigue.  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   He has been quite sensitive to noise/perfume/taste.  He has some ringing in the ear but no hearing loss. Ringing in the ears is worse this week.  He has mucositis and eating small amounts by mouth.  He has been using his PEG tube for nutrition; taking about 3 cans of Jevity per day. His nausea/vomiting has been better controlled with stronger antiemetics pre med.   His mucositis is moderate; he is not really using Lortab He reported adherence to swallowing exercises.  He alternates salt/baking soda mouth rinse along with diluted hydrogen peroxide to get out the thick phlegm.  He has noticed some shrinkage of the left cervical neck node.  He denied any skin rash or burn in the neck from radiation.   Patient denies fever,  headache, visual changes, confusion, drenching night sweats, odynophagia, dysphagia, nausea vomiting, jaundice, chest pain, palpitation, shortness of breath, dyspnea on exertion, productive cough, gum bleeding, epistaxis, hematemesis, hemoptysis, abdominal pain, abdominal swelling, early satiety, melena, hematochezia, hematuria, skin rash, spontaneous bleeding, joint swelling, joint pain, heat or cold intolerance, bowel bladder incontinence, back pain, focal motor  weakness, paresthesia, depression, suicidal or homicidal ideation, feeling hopelessness.   Past Medical History  Diagnosis Date  . 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  Marland Kitchen HTN (hypertension)   . GERD (gastroesophageal reflux disease)   . Allergy     pcns  . Diverticula of colon 03/10/12    multiple rectosigmoid colonic diverticula/ ct abd/pelvis  . Chronic kidney disease     right renal mass 7.3x6.7x6.8cm renal cell ca  . Cancer 02/17/12 bx    left tonsil squamous cell ca,HPV positive,,spread to left cervical node  . Renal mass 03/30/2012    Past Surgical History  Procedure Date  . Wisdom tooth extraction   . Direct laryngoscopy     S/P DL, Biopsy-Dr. Pollyann Kennedy. Pathology postive for SCCa of Left Tonsil  . Gastrostomy tube placement 03/02/12  . Multiple tooth extractions 03/04/12    with DR. Mohorn  . Peg tube placement   . Tonsil biopsy 02/17/12    SCCa left tomnsil,HPV positive,spread to l cervical node    Current Outpatient Prescriptions  Medication Sig Dispense Refill  . Alum & Mag Hydroxide-Simeth (MAGIC MOUTHWASH W/LIDOCAINE) SOLN Take 5 mLs by mouth 4 (four) times daily as needed.  500 mL  2  . chlorproMAZINE (THORAZINE) 25 MG tablet Take 1 tablet (25 mg total) by mouth 3 (three) times daily as needed.  30 tablet  0  . hydrocodone-acetaminophen (HYCET) 7.5-325 MG/15ML solution       . LORazepam (ATIVAN) 0.5 MG tablet Take 1 tablet (0.5 mg total) by mouth every 6 (six) hours as needed (Nausea or vomiting).  30  tablet  0  . Nutritional Supplements (FEEDING SUPPLEMENT, JEVITY 1.2 CAL,) LIQD Begin 50/50 mixture of Jevity 1.2 and Osmolite 1.2 at 40 ml/hr continuous feeding.  Increase 10 ml q 4 hours to goal of 120 ml/hr and run for 16 hours daily as tolerated.  Flush tube with 240 ml free water before continuous feedings & 60 ml free water after continuous feedings. Drink or flush tube with an additional 240 ml free water BID daily.  Send feeding pump & RN for  teaching.  DO NOT SEND FORMULA.  PATIENT IS USING SAMPLES PROVIDED FOR HIM.  1896 mL  0  . omeprazole (PRILOSEC) 20 MG capsule Take 1 capsule (20 mg total) by mouth daily.  30 capsule  2  . ondansetron (ZOFRAN) 8 MG tablet Take 1 tablet two times a day as needed for nausea or vomiting starting on the third day after chemotherapy.  30 tablet  1  . prochlorperazine (COMPAZINE) 10 MG tablet Take 1 tablet (10 mg total) by mouth every 6 (six) hours as needed (Nausea or vomiting).  30 tablet  1  . sodium fluoride (FLUORISHIELD) 1.1 % GEL dental gel Brush and floss, and then instill one drop of gel per tooth space of fluoride tray. Place over teeth for 5 minutes. Remove. Spit out excess. Repeat nightly.  120 mL  prn  . sucralfate (CARAFATE) 1 GM/10ML suspension Take 10 mLs (1 g total) by mouth 4 (four) times daily.  420 mL  0    ALLERGIES:  is allergic to penicillins.  REVIEW OF SYSTEMS:  The rest of the 14-point review of system was negative.   Filed Vitals:   05/01/12 1447  BP: 119/78  Pulse: 79  Temp: 98.2 F (36.8 C)  Resp: 20   Wt Readings from Last 3 Encounters:  05/01/12 176 lb 14.4 oz (80.241 kg)  05/01/12 177 lb (80.287 kg)  04/24/12 181 lb 8 oz (82.328 kg)   ECOG Performance status: 1  PHYSICAL EXAMINATION:  General:  well-nourished man, in no acute distress.  Eyes:  no scleral icterus.  ENT:  There were no oropharyngeal lesions.  Neck was without thyromegaly.  Lymphatics:  Positive for a 2cm left level II/III cercical node. There was no supraclavicular or axillary adenopathy.  Respiratory: lungs were clear bilaterally without wheezing or crackles.  Cardiovascular:  Regular rate and rhythm, S1/S2, without murmur, rub or gallop.  There was no pedal edema.  GI:  abdomen was soft, flat, nontender, nondistended, without organomegaly.  Muscoloskeletal:  no spinal tenderness of palpation of vertebral spine.  Skin exam was without echymosis, petichae.  Neuro exam was nonfocal.  Patient was  able to get on and off exam table without assistance.  Gait was normal.  Patient was alerted and oriented.  Attention was good.   Language was appropriate.  Mood was normal without depression.  Speech was not pressured.  Thought content was not tangential.     LABORATORY/RADIOLOGY DATA:  Lab Results  Component Value Date   WBC 4.2 05/01/2012   HGB 14.0 05/01/2012   HCT 39.8 05/01/2012   PLT 160 05/01/2012   GLUCOSE 139* 05/01/2012   ALKPHOS 69 05/01/2012   ALT 28 05/01/2012   AST 15 05/01/2012   NA 136 05/01/2012   K 4.1 05/01/2012   CL 100 05/01/2012   CREATININE 1.1 05/01/2012   BUN 24.0 05/01/2012   CO2 29 05/01/2012   INR 0.94 03/02/2012    ASSESSMENT AND PLAN:   1. Right renal  mass; positive on PET scan. To be addressed post treatment for head/neck cancer. He is scheduled for surgery by Dr Laverle Patter in October 2013.  2. Left tonsil squamous cell carcinoma:  - He is on weekly cisplatin and daily XRT.  - He now has grade 2 fatigue; grade 1 nausea/vomiting; grade 2 tinnitus, grade 2 mucositis/xerostomia. Will reduce Cisplatin dose by 25% due tinnitus. He expressed informed understanding and agreed to proceed.   3.  Mucositis/xerostomia:  Recommend that he use Lortab elixir more frequently.  If he requires Lortab more than 3 times/day next week, we may consider adding on Fentanyl patch for long acting.  I advised him to continue mouth rinses.   4.  Moderate Calorie/protein: He is on PEG tube feeding per Nutritionist.    5.  Depression:  Could also due to adjustment disorder.  Mood is better this week.  6.  Follow up:  In one week.        The length of time of the face-to-face encounter was 15 minutes. More than 50% of time was spent counseling and coordination of care.

## 2012-05-01 NOTE — Progress Notes (Signed)
Department of Radiation Oncology  Phone:  [phone] Fax:        [phone]  Weekly Treatment Note    Name: Jhase Creppel Date: 05/01/2012 MRN: [id number] DOB: 1966-08-09   Current dose: 50.9 Gy  Current fraction: 24   MEDICATIONS: Current Outpatient Prescriptions  Medication Sig Dispense Refill  . Alum & Mag Hydroxide-Simeth (MAGIC MOUTHWASH W/LIDOCAINE) SOLN Take 5 mLs by mouth 4 (four) times daily as needed.  500 mL  2  . chlorproMAZINE (THORAZINE) 25 MG tablet Take 1 tablet (25 mg total) by mouth 3 (three) times daily as needed.  30 tablet  0  . hydrocodone-acetaminophen (HYCET) 7.5-325 MG/15ML solution       . LORazepam (ATIVAN) 0.5 MG tablet Take 1 tablet (0.5 mg total) by mouth every 6 (six) hours as needed (Nausea or vomiting).  30 tablet  0  . Nutritional Supplements (FEEDING SUPPLEMENT, JEVITY 1.2 CAL,) LIQD Begin 50/50 mixture of Jevity 1.2 and Osmolite 1.2 at 40 ml/hr continuous feeding.  Increase 10 ml q 4 hours to goal of 120 ml/hr and run for 16 hours daily as tolerated.  Flush tube with 240 ml free water before continuous feedings & 60 ml free water after continuous feedings. Drink or flush tube with an additional 240 ml free water BID daily.  Send feeding pump & RN for teaching.  DO NOT SEND FORMULA.  PATIENT IS USING SAMPLES PROVIDED FOR HIM.  1896 mL  0  . omeprazole (PRILOSEC) 20 MG capsule Take 1 capsule (20 mg total) by mouth daily.  30 capsule  2  . ondansetron (ZOFRAN) 8 MG tablet Take 1 tablet two times a day as needed for nausea or vomiting starting on the third day after chemotherapy.  30 tablet  1  . prochlorperazine (COMPAZINE) 10 MG tablet Take 1 tablet (10 mg total) by mouth every 6 (six) hours as needed (Nausea or vomiting).  30 tablet  1  . sodium fluoride (FLUORISHIELD) 1.1 % GEL dental gel Brush and floss, and then instill one drop of gel per tooth space of fluoride tray. Place over teeth for 5 minutes. Remove. Spit out excess. Repeat nightly.   120 mL  prn  . sucralfate (CARAFATE) 1 GM/10ML suspension Take 10 mLs (1 g total) by mouth 4 (four) times daily.  420 mL  0     ALLERGIES: Penicillins   LABORATORY DATA:  Lab Results  Component Value Date   WBC 4.2 05/01/2012   HGB 14.0 05/01/2012   HCT 39.8 05/01/2012   MCV 89.5 05/01/2012   PLT 160 05/01/2012   Lab Results  Component Value Date   NA 136 05/01/2012   K 4.1 05/01/2012   CL 100 05/01/2012   CO2 29 05/01/2012   Lab Results  Component Value Date   ALT 28 05/01/2012   AST 15 05/01/2012   ALKPHOS 69 05/01/2012   BILITOT 0.70 05/01/2012     NARRATIVE: Ronnie Broady was seen today for weekly treatment management. The chart was checked and the patient's films were reviewed. The patient is doing well symptom-wise except for some ongoing nausea. This is being managed with several nausea medications and he has been working through this. It sounds like there his been some improvement. The patient is doing well in terms of pain. His spirits are down and we did discuss this as well.  PHYSICAL EXAMINATION: weight is 176 lb 14.4 oz (80.241 kg). His oral temperature is 98.2 F (36.8 C). His blood pressure is 119/78  and his pulse is 79. His respiration is 20.      the patient's skin looks quite good at this point with some dry desquamation especially in the shoulder regions. Hyperpigmentation present. No moist desquamation in no indication that this will occur any time sitting. Some mucositis within the oral cavity but this looks quite good for where he is in terms of his treatment. The lymph node in the upper left neck is smaller and softer although this is still somewhat prominent.  ASSESSMENT: The patient is doing satisfactorily with treatment.  PLAN: We will continue with the patient's radiation treatment as planned. Please with how he is doing overall. I did try to encourage the patient. No major modifications were necessary at this time.

## 2012-05-01 NOTE — Progress Notes (Signed)
Patient here weekly rad txs, completed 24/33, thickened saliva, patient alert,oriented x3,  No pain, eating still shrimp, soups, bolus peg feedings of jevity 1.2, and osmolite 1.2, 4xday with 240 ml free wataer before and after feedings, patient little depressed, , last Chemo this past Monday, nauseated, with emesis ,  1/2 , last night a lot states patient, taking carafate, and nausea med q 3 hours , zofran bid, ativan  q 6 hours , alternates with compazizne Helps for most part, took last zofran at 4 am anad  4pm today next chemo next tuesday 4:08 PM

## 2012-05-04 ENCOUNTER — Ambulatory Visit: Payer: Self-pay

## 2012-05-05 ENCOUNTER — Ambulatory Visit: Payer: BC Managed Care – PPO | Admitting: Nutrition

## 2012-05-05 ENCOUNTER — Ambulatory Visit
Admission: RE | Admit: 2012-05-05 | Discharge: 2012-05-05 | Disposition: A | Discharge status: Home / Self Care | Payer: BC Managed Care – PPO | Source: Ambulatory Visit | Attending: Radiation Oncology | Admitting: Radiation Oncology

## 2012-05-05 ENCOUNTER — Ambulatory Visit (HOSPITAL_BASED_OUTPATIENT_CLINIC_OR_DEPARTMENT_OTHER): Payer: BC Managed Care – PPO

## 2012-05-05 VITALS — BP 119/82 | HR 73 | Temp 98.1°F

## 2012-05-05 DIAGNOSIS — Z5111 Encounter for antineoplastic chemotherapy: Secondary | ICD-10-CM

## 2012-05-05 DIAGNOSIS — C099 Malignant neoplasm of tonsil, unspecified: Secondary | ICD-10-CM

## 2012-05-05 MED ORDER — CISPLATIN CHEMO INJECTION 100MG/100ML
30.0000 mg/m2 | Freq: Once | INTRAVENOUS | Status: AC
  Administered 2012-05-05: 63 mg via INTRAVENOUS
  Filled 2012-05-05: qty 63

## 2012-05-05 MED ORDER — SODIUM CHLORIDE 0.9 % IV SOLN
Freq: Once | INTRAVENOUS | Status: AC
  Administered 2012-05-05: 09:00:00 via INTRAVENOUS

## 2012-05-05 MED ORDER — SODIUM CHLORIDE 0.9 % IV SOLN
150.0000 mg | Freq: Once | INTRAVENOUS | Status: AC
  Administered 2012-05-05: 150 mg via INTRAVENOUS
  Filled 2012-05-05: qty 5

## 2012-05-05 MED ORDER — POTASSIUM CHLORIDE 2 MEQ/ML IV SOLN
Freq: Once | INTRAVENOUS | Status: AC
  Administered 2012-05-05: 09:00:00 via INTRAVENOUS
  Filled 2012-05-05: qty 10

## 2012-05-05 MED ORDER — DEXAMETHASONE SODIUM PHOSPHATE 10 MG/ML IJ SOLN
10.0000 mg | Freq: Once | INTRAMUSCULAR | Status: AC
  Administered 2012-05-05: 10 mg via INTRAVENOUS

## 2012-05-05 MED ORDER — PALONOSETRON HCL INJECTION 0.25 MG/5ML
0.2500 mg | Freq: Once | INTRAVENOUS | Status: AC
  Administered 2012-05-05: 0.25 mg via INTRAVENOUS

## 2012-05-06 ENCOUNTER — Ambulatory Visit
Admission: RE | Admit: 2012-05-06 | Discharge: 2012-05-06 | Disposition: A | Discharge status: Home / Self Care | Payer: BC Managed Care – PPO | Source: Ambulatory Visit | Attending: Radiation Oncology | Admitting: Radiation Oncology

## 2012-05-06 NOTE — Progress Notes (Signed)
Bobby Gonzalez continues to have weight loss.  Current weight is documented as 176.9 pounds on August 30th, which is a 12% weight loss from his usual body weight of 200 pounds.  He reports continued nausea about 4 days after treatment.  He continues to eat soft, moist foods but not a large amount.  Metallic taste continues to limit what he can tolerate. He does try to use his feeding tube and is constantly trying a different formula, so I am unable to determine how many calories he is taking in. Currently he is using a [employer] brand in a strawberry flavor with no fiber.  NUTRITION DIAGNOSIS:  Inadequate oral intake continues.  Diagnosis of inadequate enteral nutrition infusion continues.  INTERVENTION:  I have educated Bobby Gonzalez on the importance of continued small, frequent meals with tube feeding as tolerated to minimize further weight loss.  The patient verbalizes understanding.  I have stressed the importance of increased fluids.  He is continuing to try a variety of supplements to increase his oral intake.  MONITORING/EVALUATION/GOALS:  The patient has been unable to increase oral intake to support weight maintenance.  NEXT VISIT:  Wednesday, September 11th, during chemotherapy.   ______________________________ Zenovia Jarred, RD, CSO, LDN Clinical Nutrition Specialist BN/MEDQ  D:  05/05/2012  T:  05/06/2012  Job:  [id number]

## 2012-05-07 ENCOUNTER — Encounter: Payer: Self-pay | Admitting: Radiation Oncology

## 2012-05-07 ENCOUNTER — Ambulatory Visit
Admission: RE | Admit: 2012-05-07 | Discharge: 2012-05-07 | Disposition: A | Discharge status: Home / Self Care | Payer: BC Managed Care – PPO | Source: Ambulatory Visit | Attending: Radiation Oncology | Admitting: Radiation Oncology

## 2012-05-07 VITALS — BP 109/67 | HR 66 | Temp 98.1°F | Resp 20 | Wt 176.2 lb

## 2012-05-07 DIAGNOSIS — C099 Malignant neoplasm of tonsil, unspecified: Secondary | ICD-10-CM

## 2012-05-07 NOTE — Progress Notes (Signed)
Patient here weekly rad onc txs: 27/33 head/neck Alert,oriented x3, maintaining weight from last week, patient had a rough weekend stated, but is tolerating a [employer] brand of strawberry plus liquid nutrition supplement, without fiber, tolerating that well stated, , taking his nausea med around the clock, no nausea at present, no c/o pain, metallic taste still, eats soft foods, drinks some water, still somewhat depressed but better after being off 3 days, and his next  chemotherpay has been reduced, he is scheduled to have his right kidney removed on Oct 28,2013 by Dr.Borden 4:33 PM

## 2012-05-07 NOTE — Progress Notes (Signed)
Department of Radiation Oncology  Phone:  [phone] Fax:        [phone]  Weekly Treatment Note    Name: Bobby Gonzalez Date: 05/07/2012 MRN: [id number] DOB: February 01, 1966   Current dose: 57.2 Gy  Current fraction: 27   MEDICATIONS: Current Outpatient Prescriptions  Medication Sig Dispense Refill  . Alum & Mag Hydroxide-Simeth (MAGIC MOUTHWASH W/LIDOCAINE) SOLN Take 5 mLs by mouth 4 (four) times daily as needed.  500 mL  2  . chlorproMAZINE (THORAZINE) 25 MG tablet Take 1 tablet (25 mg total) by mouth 3 (three) times daily as needed.  30 tablet  0  . hydrocodone-acetaminophen (HYCET) 7.5-325 MG/15ML solution       . LORazepam (ATIVAN) 0.5 MG tablet Take 1 tablet (0.5 mg total) by mouth every 6 (six) hours as needed (Nausea or vomiting).  30 tablet  0  . Nutritional Supplements (FEEDING SUPPLEMENT, JEVITY 1.2 CAL,) LIQD Begin 50/50 mixture of Jevity 1.2 and Osmolite 1.2 at 40 ml/hr continuous feeding.  Increase 10 ml q 4 hours to goal of 120 ml/hr and run for 16 hours daily as tolerated.  Flush tube with 240 ml free water before continuous feedings & 60 ml free water after continuous feedings. Drink or flush tube with an additional 240 ml free water BID daily.  Send feeding pump & RN for teaching.  DO NOT SEND FORMULA.  PATIENT IS USING SAMPLES PROVIDED FOR HIM.  1896 mL  0  . omeprazole (PRILOSEC) 20 MG capsule Take 1 capsule (20 mg total) by mouth daily.  30 capsule  2  . ondansetron (ZOFRAN) 8 MG tablet Take 1 tablet two times a day as needed for nausea or vomiting starting on the third day after chemotherapy.  30 tablet  1  . prochlorperazine (COMPAZINE) 10 MG tablet Take 1 tablet (10 mg total) by mouth every 6 (six) hours as needed (Nausea or vomiting).  30 tablet  1  . sodium fluoride (FLUORISHIELD) 1.1 % GEL dental gel Brush and floss, and then instill one drop of gel per tooth space of fluoride tray. Place over teeth for 5 minutes. Remove. Spit out excess. Repeat nightly.   120 mL  prn  . sucralfate (CARAFATE) 1 GM/10ML suspension Take 10 mLs (1 g total) by mouth 4 (four) times daily.  420 mL  0     ALLERGIES: Penicillins   LABORATORY DATA:  Lab Results  Component Value Date   WBC 4.2 05/01/2012   HGB 14.0 05/01/2012   HCT 39.8 05/01/2012   MCV 89.5 05/01/2012   PLT 160 05/01/2012   Lab Results  Component Value Date   NA 136 05/01/2012   K 4.1 05/01/2012   CL 100 05/01/2012   CO2 29 05/01/2012   Lab Results  Component Value Date   ALT 28 05/01/2012   AST 15 05/01/2012   ALKPHOS 69 05/01/2012   BILITOT 0.70 05/01/2012     NARRATIVE: Bobby Gonzalez was seen today for weekly treatment management. The chart was checked and the patient's films were reviewed. The patient I believe is doing satisfactorily. He stated that he had a rough weekend but is doing better. He is taking in more liquid nutrition. Continues to take nausea medicines around-the-clock. No nausea at present.  PHYSICAL EXAMINATION: weight is 176 lb 3.2 oz (79.924 kg). His temperature is 98.1 F (36.7 C). His blood pressure is 109/67 and his pulse is 66. His respiration is 20.      the patient's skin is holding  up quite well actually. Some increased hyperpigmentation especially in the left neck. Mucositis present more posteriorly in the oral cavity but overall this looks okay with no thrush or significant change. Left neck node is softer and smaller although this remains easily palpable.  ASSESSMENT: The patient is doing satisfactorily with treatment.  PLAN: We will continue with the patient's radiation treatment as planned. The patient is hanging in there and will finish late next week.

## 2012-05-08 ENCOUNTER — Ambulatory Visit
Admission: RE | Admit: 2012-05-08 | Discharge: 2012-05-08 | Disposition: A | Discharge status: Home / Self Care | Payer: BC Managed Care – PPO | Source: Ambulatory Visit | Attending: Radiation Oncology | Admitting: Radiation Oncology

## 2012-05-08 ENCOUNTER — Ambulatory Visit: Payer: Self-pay | Admitting: Oncology

## 2012-05-08 ENCOUNTER — Encounter: Payer: Self-pay | Admitting: Oncology

## 2012-05-08 NOTE — Progress Notes (Signed)
Pt's wife called inquiring about financial asst options.  I explained about our financial app and what was needed in order to get it processed and based on income the pt could get approved from 5% to 100% discount.  She had concerns about a letter from [facility] denying a couple of home health visits because of no precertification.  I explained that we didn't handle precerts for home health visits and that she should call [facility] to follow up on that issue.

## 2012-05-11 ENCOUNTER — Ambulatory Visit
Admission: RE | Admit: 2012-05-11 | Discharge: 2012-05-11 | Disposition: A | Discharge status: Home / Self Care | Payer: BC Managed Care – PPO | Source: Ambulatory Visit | Attending: Radiation Oncology | Admitting: Radiation Oncology

## 2012-05-12 ENCOUNTER — Other Ambulatory Visit (HOSPITAL_BASED_OUTPATIENT_CLINIC_OR_DEPARTMENT_OTHER): Payer: BC Managed Care – PPO

## 2012-05-12 ENCOUNTER — Ambulatory Visit
Admission: RE | Admit: 2012-05-12 | Discharge: 2012-05-12 | Disposition: A | Discharge status: Home / Self Care | Payer: BC Managed Care – PPO | Source: Ambulatory Visit | Attending: Radiation Oncology | Admitting: Radiation Oncology

## 2012-05-12 DIAGNOSIS — K1230 Oral mucositis (ulcerative), unspecified: Secondary | ICD-10-CM

## 2012-05-12 DIAGNOSIS — C099 Malignant neoplasm of tonsil, unspecified: Secondary | ICD-10-CM

## 2012-05-12 DIAGNOSIS — IMO0001 Reserved for inherently not codable concepts without codable children: Secondary | ICD-10-CM

## 2012-05-12 DIAGNOSIS — K121 Other forms of stomatitis: Secondary | ICD-10-CM

## 2012-05-12 DIAGNOSIS — K219 Gastro-esophageal reflux disease without esophagitis: Secondary | ICD-10-CM

## 2012-05-12 LAB — COMPREHENSIVE METABOLIC PANEL (CC13)
ALT: 27 U/L (ref 0–55)
AST: 16 U/L (ref 5–34)
Albumin: 4 g/dL (ref 3.5–5.0)
Alkaline Phosphatase: 76 U/L (ref 40–150)
BUN: 25 mg/dL (ref 7.0–26.0)
CO2: 29 mEq/L (ref 22–29)
Calcium: 9.5 mg/dL (ref 8.4–10.4)
Chloride: 101 mEq/L (ref 98–107)
Creatinine: 1 mg/dL (ref 0.7–1.3)
Glucose: 98 mg/dl (ref 70–99)
Potassium: 4.2 mEq/L (ref 3.5–5.1)
Sodium: 138 mEq/L (ref 136–145)
Total Bilirubin: 0.8 mg/dL (ref 0.20–1.20)
Total Protein: 6.8 g/dL (ref 6.4–8.3)

## 2012-05-12 LAB — CBC WITH DIFFERENTIAL/PLATELET
BASO%: 0.2 % (ref 0.0–2.0)
Basophils Absolute: 0 10*3/uL (ref 0.0–0.1)
EOS%: 0.4 % (ref 0.0–7.0)
Eosinophils Absolute: 0 10*3/uL (ref 0.0–0.5)
HCT: 36.9 % — ABNORMAL LOW (ref 38.4–49.9)
HGB: 12.9 g/dL — ABNORMAL LOW (ref 13.0–17.1)
LYMPH%: 4.1 % — ABNORMAL LOW (ref 14.0–49.0)
MCH: 31.5 pg (ref 27.2–33.4)
MCHC: 35 g/dL (ref 32.0–36.0)
MCV: 89.9 fL (ref 79.3–98.0)
MONO#: 0.5 10*3/uL (ref 0.1–0.9)
MONO%: 19.8 % — ABNORMAL HIGH (ref 0.0–14.0)
NEUT#: 2 10*3/uL (ref 1.5–6.5)
NEUT%: 75.5 % — ABNORMAL HIGH (ref 39.0–75.0)
Platelets: 169 10*3/uL (ref 140–400)
RBC: 4.1 10*6/uL — ABNORMAL LOW (ref 4.20–5.82)
RDW: 14.2 % (ref 11.0–14.6)
WBC: 2.7 10*3/uL — ABNORMAL LOW (ref 4.0–10.3)
lymph#: 0.1 10*3/uL — ABNORMAL LOW (ref 0.9–3.3)

## 2012-05-12 NOTE — Patient Instructions (Addendum)
1.  Oropharynx squamous cell carcinoma. 2.  Treatment:  Daily radiation until 05/15/12.  Weekly Cisplatin:  Today is the last dose.  Dose will be decreased to 60% of baseline due to fatigue, nausea/vomiting and ringnng in the ears.  3.  Supportive care:  *  PEG tube:  To maintain weight/nutrition.  Follow up with Vernell Leep.  *  Nausea/vomiting:  Continue Compazine/Zofran/Ativan as needed.   *  Mouth rinses:  At least 4x/day with salt/baking soda.  *  Dry mouth/thick phlegm:  Diluted hydrogen peroxide (1:4 ratio) or viscous lidocaine/Robitussin (1:1 ratio)  *  Pain medication with Lortab Elixir.  If need Lortab more than 3 times a day, we should consider adding on long acting Fentanyl patch.   4.  Follow up in about 3 weeks to ensure good recovery.  Repeat PET scan in about 4 months.  5.  Follow up with Urology for kidney mass.   6.  I will arrange for home health for Yonker suction unit for thick phlegm. 7.  IVF later this week?

## 2012-05-13 ENCOUNTER — Ambulatory Visit: Payer: BC Managed Care – PPO | Admitting: Nutrition

## 2012-05-13 ENCOUNTER — Ambulatory Visit
Admission: RE | Admit: 2012-05-13 | Discharge: 2012-05-13 | Disposition: A | Discharge status: Home / Self Care | Payer: BC Managed Care – PPO | Source: Ambulatory Visit | Attending: Radiation Oncology | Admitting: Radiation Oncology

## 2012-05-13 ENCOUNTER — Telehealth: Payer: Self-pay | Admitting: Oncology

## 2012-05-13 ENCOUNTER — Ambulatory Visit (HOSPITAL_BASED_OUTPATIENT_CLINIC_OR_DEPARTMENT_OTHER): Payer: BC Managed Care – PPO | Admitting: Oncology

## 2012-05-13 ENCOUNTER — Ambulatory Visit (HOSPITAL_BASED_OUTPATIENT_CLINIC_OR_DEPARTMENT_OTHER): Payer: BC Managed Care – PPO

## 2012-05-13 ENCOUNTER — Encounter: Payer: Self-pay | Admitting: Oncology

## 2012-05-13 ENCOUNTER — Other Ambulatory Visit: Payer: Self-pay | Admitting: *Deleted

## 2012-05-13 VITALS — BP 142/87 | HR 81 | Temp 98.0°F | Resp 20 | Ht 70.0 in | Wt 169.4 lb

## 2012-05-13 DIAGNOSIS — C099 Malignant neoplasm of tonsil, unspecified: Secondary | ICD-10-CM

## 2012-05-13 DIAGNOSIS — R0989 Other specified symptoms and signs involving the circulatory and respiratory systems: Secondary | ICD-10-CM

## 2012-05-13 DIAGNOSIS — B977 Papillomavirus as the cause of diseases classified elsewhere: Secondary | ICD-10-CM

## 2012-05-13 DIAGNOSIS — N2889 Other specified disorders of kidney and ureter: Secondary | ICD-10-CM

## 2012-05-13 DIAGNOSIS — K117 Disturbances of salivary secretion: Secondary | ICD-10-CM

## 2012-05-13 DIAGNOSIS — E860 Dehydration: Secondary | ICD-10-CM

## 2012-05-13 DIAGNOSIS — R112 Nausea with vomiting, unspecified: Secondary | ICD-10-CM

## 2012-05-13 DIAGNOSIS — E440 Moderate protein-calorie malnutrition: Secondary | ICD-10-CM

## 2012-05-13 DIAGNOSIS — N289 Disorder of kidney and ureter, unspecified: Secondary | ICD-10-CM

## 2012-05-13 DIAGNOSIS — C801 Malignant (primary) neoplasm, unspecified: Secondary | ICD-10-CM

## 2012-05-13 DIAGNOSIS — N189 Chronic kidney disease, unspecified: Secondary | ICD-10-CM

## 2012-05-13 DIAGNOSIS — K1230 Oral mucositis (ulcerative), unspecified: Secondary | ICD-10-CM

## 2012-05-13 DIAGNOSIS — K121 Other forms of stomatitis: Secondary | ICD-10-CM

## 2012-05-13 DIAGNOSIS — Z5111 Encounter for antineoplastic chemotherapy: Secondary | ICD-10-CM

## 2012-05-13 HISTORY — DX: Other specified symptoms and signs involving the circulatory and respiratory systems: R09.89

## 2012-05-13 HISTORY — DX: Dehydration: E86.0

## 2012-05-13 MED ORDER — SODIUM CHLORIDE 0.9 % IV SOLN
150.0000 mg | Freq: Once | INTRAVENOUS | Status: AC
  Administered 2012-05-13: 150 mg via INTRAVENOUS
  Filled 2012-05-13: qty 5

## 2012-05-13 MED ORDER — PROCHLORPERAZINE MALEATE 10 MG PO TABS: 10.0000 mg | ORAL_TABLET | Freq: Four times a day (QID) | ORAL | Status: DC | PRN

## 2012-05-13 MED ORDER — ONDANSETRON HCL 8 MG PO TABS: ORAL_TABLET | ORAL | Status: DC

## 2012-05-13 MED ORDER — SODIUM CHLORIDE 0.9 % IV SOLN
24.0000 mg/m2 | Freq: Once | INTRAVENOUS | Status: AC
  Administered 2012-05-13: 50 mg via INTRAVENOUS
  Filled 2012-05-13: qty 50

## 2012-05-13 MED ORDER — POTASSIUM CHLORIDE 2 MEQ/ML IV SOLN
Freq: Once | INTRAVENOUS | Status: AC
  Administered 2012-05-13: 10:00:00 via INTRAVENOUS
  Filled 2012-05-13: qty 10

## 2012-05-13 MED ORDER — DEXAMETHASONE SODIUM PHOSPHATE 10 MG/ML IJ SOLN
10.0000 mg | Freq: Once | INTRAMUSCULAR | Status: AC
  Administered 2012-05-13: 10 mg via INTRAVENOUS

## 2012-05-13 MED ORDER — OMEPRAZOLE 20 MG PO CPDR: 20.0000 mg | DELAYED_RELEASE_CAPSULE | Freq: Every day | ORAL | Status: DC

## 2012-05-13 MED ORDER — SODIUM CHLORIDE 0.9 % IV SOLN
Freq: Once | INTRAVENOUS | Status: AC
  Administered 2012-05-13: 15:00:00 via INTRAVENOUS

## 2012-05-13 MED ORDER — HYDROCODONE-ACETAMINOPHEN 7.5-325 MG/15ML PO SOLN: 15.0000 mL | Freq: Four times a day (QID) | ORAL | Status: DC | PRN

## 2012-05-13 MED ORDER — PALONOSETRON HCL INJECTION 0.25 MG/5ML
0.2500 mg | Freq: Once | INTRAVENOUS | Status: AC
  Administered 2012-05-13: 0.25 mg via INTRAVENOUS

## 2012-05-13 MED ORDER — PROMETHAZINE HCL 25 MG RE SUPP: 25.0000 mg | Freq: Two times a day (BID) | RECTAL | Status: DC | PRN

## 2012-05-13 MED ORDER — SODIUM CHLORIDE 0.9 % IV SOLN
Freq: Once | INTRAVENOUS | Status: AC
  Administered 2012-05-13: 10:00:00 via INTRAVENOUS

## 2012-05-13 MED ORDER — LORAZEPAM 0.5 MG PO TABS: 0.5000 mg | ORAL_TABLET | Freq: Four times a day (QID) | ORAL | Status: DC | PRN

## 2012-05-13 NOTE — Patient Instructions (Signed)
[facility] Discharge Instructions for Patients Receiving Chemotherapy  Today you received the following chemotherapy agents Cisplatin To help prevent nausea and vomiting after your treatment, we encourage you to take your nausea medication as prescribed.  If you develop nausea and vomiting that is not controlled by your nausea medication, call the clinic. If it is after clinic hours your family physician or the after hours number for the clinic or go to the Emergency Department.   BELOW ARE SYMPTOMS THAT SHOULD BE REPORTED IMMEDIATELY:  *FEVER GREATER THAN 100.5 F  *CHILLS WITH OR WITHOUT FEVER  NAUSEA AND VOMITING THAT IS NOT CONTROLLED WITH YOUR NAUSEA MEDICATION  *UNUSUAL SHORTNESS OF BREATH  *UNUSUAL BRUISING OR BLEEDING  TENDERNESS IN MOUTH AND THROAT WITH OR WITHOUT PRESENCE OF ULCERS  *URINARY PROBLEMS  *BOWEL PROBLEMS  UNUSUAL RASH Items with * indicate a potential emergency and should be followed up as soon as possible.  One of the nurses will contact you 24 hours after your treatment. Please let the nurse know about any problems that you may have experienced. Feel free to call the clinic you have any questions or concerns. The clinic phone number is [phone].   I have been informed and understand all the instructions given to me. I know to contact the clinic, my physician, or go to the Emergency Department if any problems should occur. I do not have any questions at this time, but understand that I may call the clinic during office hours   should I have any questions or need assistance in obtaining follow up care.    __________________________________________  _____________  __________ Signature of Patient or Authorized Representative            Date                   Time    __________________________________________ Nurse's Signature    

## 2012-05-13 NOTE — Progress Notes (Signed)
Bobby Gonzalez reports increased nausea and vomiting over this last week. He states it was his worst week ever.  His weight has declined to 169.4 pounds documented September 11th, from 176.2 pounds documented September 5th.  He states today is his final chemotherapy.  Radiation is to complete on September 13.  He continues to tolerate small amounts of oral intake including macaroni and cheese and some chicken.  He believes his vomiting was intensified secondary to increased mucus production.  He also is supplementing using his feeding tube.  However, when he has vomiting, he is afraid to try to eat or administer tube feedings.  NUTRITION DIAGNOSIS:  Inadequate oral intake and inadequate enteral nutrition infusion both continue.  INTERVENTION:  I have educated Bobby Gonzalez to be sure to be diligent about taking nausea medicines around the clock.  He has been educated to increase fluid intake, either by mouth or through the tube.  He was encouraged to focus on tube feedings for nutrition purposes and to eat for pleasure when he feels well.  I have also educated him on the importance of continuing mouth rinses.  Patient able to verbalize some strategies for increasing intake this week after chemotherapy.  MONITORING/EVALUATION/GOALS:  The patient has been unable to increase oral intake or get tube feedings to goal weight to support weight maintenance.  NEXT VISIT:  Wednesday, October 2nd.   ______________________________ Zenovia Jarred, RD, CSO, LDN Clinical Nutrition Specialist BN/MEDQ  D:  05/13/2012  T:  05/13/2012  Job:  [id number]

## 2012-05-13 NOTE — Progress Notes (Signed)
[facility]  [phone]) [phone] [phone]   OFFICE PROGRESS NOTE   Cc:  [name], MD  DIAGNOSIS:  1. 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 Right renal mass with positive PET scan; pending evaluation and workup.  2. Right renal mass presumed to be renal cell carcinoma.   CURRENT THERAPY:  1. Due to start concurrent chemo radiation today 03/30/2012 with daily XRT and weekly Cisplatin.  2. Pending nephrectomy.   INTERVAL HISTORY: Bobby Gonzalez 46 y.o. male returns for regular followup visit with his wife. He reports that he still has moderate mouth sore. He is taking Lortab elixir no more than 3 times a day. He does not want any additional pain medication beside Lortab elixir. He still has a lot of thick phlegm from xerostomia. He is very compliant with his mouth rinses with salt and baking soda alternating with diluted peroxide. However, he still gags alot from the thick phlegm.  In fact over the past few days he is been having nausea/vomiting not be able to tolerate feeding tube very well. He has been noncompliant despite taking Compazine, Zofran and Ativan. He still able to eat mechanical soft foods such as soups and ice cream.  He has fatigue; however, he is able to perform activities of daily living.  His left cervical lymph node has continued to decrease in size. He only has some minor skin rash at the site of radiation without open wound. He still has stable tinnitus. His hearing is stable per his report.  Patient denies fever,  headache, visual changes, confusion, drenching night sweats, palpable lymph node swelling,  jaundice, chest pain, palpitation, shortness of breath, dyspnea on exertion, productive cough, gum bleeding, epistaxis, hematemesis, hemoptysis, abdominal pain, abdominal swelling, early satiety, melena, hematochezia, hematuria, skin rash, spontaneous bleeding, joint swelling, joint pain, heat or cold  intolerance, bowel bladder incontinence, back pain, focal motor weakness, paresthesia, depression, suicidal or homicidal ideation, feeling hopelessness.   Past Medical History  Diagnosis Date  . 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  Marland Kitchen HTN (hypertension)   . GERD (gastroesophageal reflux disease)   . Allergy     pcns  . Diverticula of colon 03/10/12    multiple rectosigmoid colonic diverticula/ ct abd/pelvis  . Chronic kidney disease     right renal mass 7.3x6.7x6.8cm renal cell ca  . Cancer 02/17/12 bx    left tonsil squamous cell ca,HPV positive,,spread to left cervical node  . Renal mass 03/30/2012  . Dehydration 05/13/2012  . Phlegm in throat 05/13/2012    Past Surgical History  Procedure Date  . Wisdom tooth extraction   . Direct laryngoscopy     S/P DL, Biopsy-Dr. Pollyann Kennedy. Pathology postive for SCCa of Left Tonsil  . Gastrostomy tube placement 03/02/12  . Multiple tooth extractions 03/04/12    with DR. Mohorn  . Peg tube placement   . Tonsil biopsy 02/17/12    SCCa left tomnsil,HPV positive,spread to l cervical node    Current Outpatient Prescriptions  Medication Sig Dispense Refill  . Alum & Mag Hydroxide-Simeth (MAGIC MOUTHWASH W/LIDOCAINE) SOLN Take 5 mLs by mouth 4 (four) times daily as needed.  500 mL  2  . chlorproMAZINE (THORAZINE) 25 MG tablet Take 1 tablet (25 mg total) by mouth 3 (three) times daily as needed.  30 tablet  0  . hydrocodone-acetaminophen (HYCET) 7.5-325 MG/15ML solution Take 15 mLs by mouth every 6 (six) hours  as needed for pain.  473 mL  0  . LORazepam (ATIVAN) 0.5 MG tablet Take 1 tablet (0.5 mg total) by mouth every 6 (six) hours as needed (Nausea or vomiting).  30 tablet  2  . Nutritional Supplements (FEEDING SUPPLEMENT, JEVITY 1.2 CAL,) LIQD Begin 50/50 mixture of Jevity 1.2 and Osmolite 1.2 at 40 ml/hr continuous feeding.  Increase 10 ml q 4 hours to goal of 120 ml/hr and run for 16 hours daily as tolerated.  Flush tube  with 240 ml free water before continuous feedings & 60 ml free water after continuous feedings. Drink or flush tube with an additional 240 ml free water BID daily.  Send feeding pump & RN for teaching.  DO NOT SEND FORMULA.  PATIENT IS USING SAMPLES PROVIDED FOR HIM.  1896 mL  0  . omeprazole (PRILOSEC) 20 MG capsule Take 1 capsule (20 mg total) by mouth daily.  30 capsule  2  . ondansetron (ZOFRAN) 8 MG tablet Take 1 tablet two times a day as needed for nausea or vomiting starting on the third day after chemotherapy.  30 tablet  1  . prochlorperazine (COMPAZINE) 10 MG tablet Take 1 tablet (10 mg total) by mouth every 6 (six) hours as needed (Nausea or vomiting).  30 tablet  1  . sodium fluoride (FLUORISHIELD) 1.1 % GEL dental gel Brush and floss, and then instill one drop of gel per tooth space of fluoride tray. Place over teeth for 5 minutes. Remove. Spit out excess. Repeat nightly.  120 mL  prn  . sucralfate (CARAFATE) 1 GM/10ML suspension Take 10 mLs (1 g total) by mouth 4 (four) times daily.  420 mL  0  . DISCONTD: omeprazole (PRILOSEC) 20 MG capsule Take 1 capsule (20 mg total) by mouth daily.  30 capsule  2  . DISCONTD: prochlorperazine (COMPAZINE) 10 MG tablet Take 1 tablet (10 mg total) by mouth every 6 (six) hours as needed (Nausea or vomiting).  30 tablet  1  . promethazine (PHENERGAN) 25 MG suppository Place 1 suppository (25 mg total) rectally every 12 (twelve) hours as needed for nausea.  12 each  3   No current facility-administered medications for this visit.   Facility-Administered Medications Ordered in Other Visits  Medication Dose Route Frequency Provider Last Rate Last Dose  . 0.9 %  sodium chloride infusion   Intravenous Once Exie Parody, MD      . 0.9 %  sodium chloride infusion   Intravenous Once Exie Parody, MD 250 mL/hr at 05/13/12 1440    . CISplatin (PLATINOL) 50 mg in sodium chloride 0.9 % 250 mL chemo infusion  24 mg/m2 (Treatment Plan Actual) Intravenous Once Exie Parody, MD       . dexamethasone (DECADRON) injection 10 mg  10 mg Intravenous Once Exie Parody, MD   10 mg at 05/13/12 1510  . dextrose 5 % and 0.45% NaCl 1,000 mL with potassium chloride 20 mEq, magnesium sulfate 12 mEq infusion   Intravenous Once Exie Parody, MD      . fosaprepitant (EMEND) 150 mg in sodium chloride 0.9 % 145 mL IVPB  150 mg Intravenous Once Exie Parody, MD 300 mL/hr at 05/13/12 1510 150 mg at 05/13/12 1510  . palonosetron (ALOXI) injection 0.25 mg  0.25 mg Intravenous Once Exie Parody, MD   0.25 mg at 05/13/12 1447    ALLERGIES:  is allergic to penicillins.  REVIEW OF SYSTEMS:  The rest of the  14-point review of system was negative.   Filed Vitals:   05/13/12 0848  BP: 142/87  Pulse: 81  Temp: 98 F (36.7 C)  Resp: 20   Wt Readings from Last 3 Encounters:  05/13/12 169 lb 6.4 oz (76.839 kg)  05/07/12 176 lb 3.2 oz (79.924 kg)  05/01/12 176 lb 14.4 oz (80.241 kg)   ECOG Performance status: 1  PHYSICAL EXAMINATION:  General: thin-appearing man, in no acute distress. Eyes: no scleral icterus. ENT: There were no oropharyngeal lesions. Neck was without thyromegaly. Lymphatics: Positive for a 1 cm left level II/III cercical node. There was no supraclavicular or axillary adenopathy. Respiratory: lungs were clear bilaterally without wheezing or crackles. Cardiovascular: Regular rate and rhythm, S1/S2, without murmur, rub or gallop. There was no pedal edema. GI: abdomen was soft, flat, nontender, nondistended, without organomegaly.  PEG tube was dry, clean, intact. Muscoloskeletal: no spinal tenderness of palpation of vertebral spine. Skin exam was without echymosis, petichae. Neuro exam was nonfocal. Patient was able to get on and off exam table without assistance. Gait was normal. Patient was alerted and oriented. Attention was good. Language was appropriate. Mood was normal without depression. Speech was not pressured. Thought content was not tangential.     LABORATORY/RADIOLOGY  DATA:  Lab Results  Component Value Date   WBC 2.7* 05/12/2012   HGB 12.9* 05/12/2012   HCT 36.9* 05/12/2012   PLT 169 05/12/2012   GLUCOSE 98 05/12/2012   ALKPHOS 76 05/12/2012   ALT 27 05/12/2012   AST 16 05/12/2012   NA 138 05/12/2012   K 4.2 05/12/2012   CL 101 05/12/2012   CREATININE 1.0 05/12/2012   BUN 25.0 05/12/2012   CO2 29 05/12/2012   INR 0.94 03/02/2012    ASSESSMENT AND PLAN:   1. Right renal mass; positive on PEG scan. To be addressed post treatment for head/neck cancer.   2. Left tonsil squamous cell carcinoma:  - He is on weekly cisplatin and daily XRT.  Today is the last dose of chemo.  Given the tinnitus he and some nausea vomiting, I increased his dose of cisplatin chemotherapy further now at 60% from baseline. - He now has grade 2 fatigue; grade 2 nausea/vomiting; grade 1 tinnitus, grade 2 mucositis/xerostomia.  3. Mucositis/xerostomia: Continue on Lortab elixir. He does not require a long-acting pain medication such as fentanyl patch since he is using Lortab 3 times a day at most. 4.  xerostomia: 1 to continue with salt and baking soda mouth rinses alternating with diluted hydrogen peroxide mouth rinses.  I prescribed Yonker suction unit by [facility] because he still has thick phlegm despite mouth rinses.   5. nausea vomiting: I prescribed additional Phenergan PR suppository addition to Compazine/Zofran/Ativan. 6. Moderate Calorie/protein: He is on PEG tube feeding per Nutritionist. He did not like to be on continues to feed by pump at night because is having more nausea vomiting before. 7. Mood:  Stable.  8. Follow up: In about 3 weeks to ensure that he is on the right track to recovery.     The length of time of the face-to-face encounter was 25 minutes. More than 50% of time was spent counseling and coordination of care.

## 2012-05-13 NOTE — Telephone Encounter (Signed)
gv pt appt schedule for September/October.  °

## 2012-05-13 NOTE — Progress Notes (Signed)
Total urine output pre-Cisplatin 250ml-dhp, rn 

## 2012-05-13 NOTE — Treatment Plan (Signed)
[facility] END OF TREATMENT   Name: Bobby Gonzalez Date: 05/13/2012 MRN: [id number] DOB: 1966-03-01   TREATMENT DATES:  03/30/2012 to 05/13/2012.    REFERRING PHYSICIAN:  Roby Lofts, M.D.   DIAGNOSIS: oropharynx cancer.    STAGE AT START OF TREATMENT:  IVA.    INTENT:Curative   DRUGS OR REGIMENS GIVEN:  concurrent chemo radiation today 03/30/2012 with daily XRT and weekly Cisplatin.    MAJOR TOXICITIES:  grade 2 fatigue; grade 2 nausea/vomiting; grade 1 tinnitus, grade 2 mucositis/xerostomia.     REASON TREATMENT STOPPED:  Finish of planned treatment.    PERFORMANCE STATUS AT END:  ECOG 1   ONGOING PROBLEMS:  Right renal mass.    FOLLOW UP PLANS:  With [place] in about 3 weeks.  With Dr. Laverle Patter in Oct 2013 for work up of right renal mass.

## 2012-05-14 ENCOUNTER — Ambulatory Visit
Admission: RE | Admit: 2012-05-14 | Discharge: 2012-05-14 | Disposition: A | Discharge status: Home / Self Care | Payer: BC Managed Care – PPO | Source: Ambulatory Visit | Attending: Radiation Oncology | Admitting: Radiation Oncology

## 2012-05-15 ENCOUNTER — Encounter: Payer: Self-pay | Admitting: Radiation Oncology

## 2012-05-15 ENCOUNTER — Ambulatory Visit
Admission: RE | Admit: 2012-05-15 | Discharge: 2012-05-15 | Disposition: A | Discharge status: Home / Self Care | Payer: BC Managed Care – PPO | Source: Ambulatory Visit | Attending: Radiation Oncology | Admitting: Radiation Oncology

## 2012-05-15 VITALS — BP 113/71 | HR 80 | Temp 98.0°F | Resp 20 | Wt 172.9 lb

## 2012-05-15 DIAGNOSIS — C099 Malignant neoplasm of tonsil, unspecified: Secondary | ICD-10-CM

## 2012-05-15 NOTE — Progress Notes (Signed)
Patient here weekly and last rad tx today,   Hasn't  Had tx yet Alert,oriented x3, had a milkshake yesterday and drinking water, ensure via peg tube, 4 cans daily with free water before and after, n o c/o pain, takes nausea med around the clock,mild saliva stated, patientn to get IVF'S tomorrow, had fluidso n wed,

## 2012-05-15 NOTE — Progress Notes (Signed)
Department of Radiation Oncology  Phone:  [phone] Fax:        [phone]  Weekly Treatment Note    Name: Bobby Gonzalez Date: 05/15/2012 MRN: [id number] DOB: February 23, 1966   Current dose: 69.96 Gy  Current fraction: 33   MEDICATIONS: Current Outpatient Prescriptions  Medication Sig Dispense Refill  . Alum & Mag Hydroxide-Simeth (MAGIC MOUTHWASH W/LIDOCAINE) SOLN Take 5 mLs by mouth 4 (four) times daily as needed.  500 mL  2  . chlorproMAZINE (THORAZINE) 25 MG tablet Take 1 tablet (25 mg total) by mouth 3 (three) times daily as needed.  30 tablet  0  . hydrocodone-acetaminophen (HYCET) 7.5-325 MG/15ML solution Take 15 mLs by mouth every 6 (six) hours as needed for pain.  473 mL  0  . LORazepam (ATIVAN) 0.5 MG tablet Take 1 tablet (0.5 mg total) by mouth every 6 (six) hours as needed (Nausea or vomiting).  30 tablet  2  . Nutritional Supplements (FEEDING SUPPLEMENT, JEVITY 1.2 CAL,) LIQD Begin 50/50 mixture of Jevity 1.2 and Osmolite 1.2 at 40 ml/hr continuous feeding.  Increase 10 ml q 4 hours to goal of 120 ml/hr and run for 16 hours daily as tolerated.  Flush tube with 240 ml free water before continuous feedings & 60 ml free water after continuous feedings. Drink or flush tube with an additional 240 ml free water BID daily.  Send feeding pump & RN for teaching.  DO NOT SEND FORMULA.  PATIENT IS USING SAMPLES PROVIDED FOR HIM.  1896 mL  0  . omeprazole (PRILOSEC) 20 MG capsule Take 1 capsule (20 mg total) by mouth daily.  30 capsule  2  . ondansetron (ZOFRAN) 8 MG tablet Take 1 tablet two times a day as needed for nausea or vomiting starting on the third day after chemotherapy.  30 tablet  1  . prochlorperazine (COMPAZINE) 10 MG tablet Take 1 tablet (10 mg total) by mouth every 6 (six) hours as needed (Nausea or vomiting).  30 tablet  1  . promethazine (PHENERGAN) 25 MG suppository Place 1 suppository (25 mg total) rectally every 12 (twelve) hours as needed for nausea.  12 each  3    . sodium fluoride (FLUORISHIELD) 1.1 % GEL dental gel Brush and floss, and then instill one drop of gel per tooth space of fluoride tray. Place over teeth for 5 minutes. Remove. Spit out excess. Repeat nightly.  120 mL  prn  . sucralfate (CARAFATE) 1 GM/10ML suspension Take 10 mLs (1 g total) by mouth 4 (four) times daily.  420 mL  0     ALLERGIES: Penicillins   LABORATORY DATA:  Lab Results  Component Value Date   WBC 2.7* 05/12/2012   HGB 12.9* 05/12/2012   HCT 36.9* 05/12/2012   MCV 89.9 05/12/2012   PLT 169 05/12/2012   Lab Results  Component Value Date   NA 138 05/12/2012   K 4.2 05/12/2012   CL 101 05/12/2012   CO2 29 05/12/2012   Lab Results  Component Value Date   ALT 27 05/12/2012   AST 16 05/12/2012   ALKPHOS 76 05/12/2012   BILITOT 0.80 05/12/2012     NARRATIVE: Bobby Gonzalez was seen today for weekly treatment management. The chart was checked and the patient's films were reviewed. The patient is seen before his final treatment today. The machine is running a little behind. The patient finishes his final fraction and he is very pleased with this. Symptoms have been stable. He has been able  to increase his nutritional intake and has actually gained a couple pounds at the end of treatment which is excellent. No new complaints.  PHYSICAL EXAMINATION: weight is 172 lb 14.4 oz (78.427 kg). His oral temperature is 98 F (36.7 C). His blood pressure is 113/71 and his pulse is 80. His respiration is 20.      skin irritation is present with some fairly mild desquamation and hyperpigmentation. No moist desquamation present. Oral cavity shows some mucositis but remains stable.  ASSESSMENT: The patient has done satisfactorily with treatment. Overall very pleased with his course of treatment and how he has done.  PLAN: Followup in one month.

## 2012-05-16 ENCOUNTER — Ambulatory Visit (HOSPITAL_BASED_OUTPATIENT_CLINIC_OR_DEPARTMENT_OTHER): Payer: BC Managed Care – PPO

## 2012-05-16 VITALS — BP 103/52 | HR 72 | Temp 96.9°F | Resp 18

## 2012-05-16 DIAGNOSIS — E860 Dehydration: Secondary | ICD-10-CM

## 2012-05-16 DIAGNOSIS — C099 Malignant neoplasm of tonsil, unspecified: Secondary | ICD-10-CM

## 2012-05-16 MED ORDER — SODIUM CHLORIDE 0.9 % IV SOLN
INTRAVENOUS | Status: DC
  Administered 2012-05-16: 11:00:00 via INTRAVENOUS

## 2012-05-16 NOTE — Patient Instructions (Addendum)
Dehydration, Adult Dehydration means your body does not have as much fluid as it needs. Your kidneys, brain, and heart will not work properly without the right amount of fluids and salt.  HOME CARE  Ask your doctor how to replace body fluid losses (rehydrate).   Drink enough fluids to keep your pee (urine) clear or pale yellow.   Drink small amounts of fluids often if you feel sick to your stomach (nauseous) or throw up (vomit).   Eat like you normally do.   Avoid:   Foods or drinks high in sugar.   Bubbly (carbonated) drinks.   Juice.   Very hot or cold fluids.   Drinks with caffeine.   Fatty, greasy foods.   Alcohol.   Tobacco.   Eating too much.   Gelatin desserts.   Wash your hands to avoid spreading germs (bacteria, viruses).   Only take medicine as told by your doctor.   Keep all doctor visits as told.  GET HELP RIGHT AWAY IF:   You cannot drink something without throwing up.   You get worse even with treatment.   Your vomit has blood in it or looks greenish.   Your poop (stool) has blood in it or looks black and tarry.   You have not peed in 6 to 8 hours.   You pee a small amount of very dark pee.   You have a fever.   You pass out (faint).   You have belly (abdominal) pain that gets worse or stays in one spot (localizes).   You have a rash, stiff neck, or bad headache.   You get easily annoyed, sleepy, or are hard to wake up.   You feel weak, dizzy, or very thirsty.  MAKE SURE YOU:   Understand these instructions.   Will watch your condition.   Will get help right away if you are not doing well or get worse.  Document Released: 06/15/2009 Document Revised: 08/08/2011 Document Reviewed: 04/08/2011 Exit[employer] Patient Information 2012 [employer]. 

## 2012-05-22 ENCOUNTER — Telehealth: Payer: Self-pay | Admitting: *Deleted

## 2012-05-22 MED ORDER — FLUCONAZOLE 10 MG/ML PO SUSR
100.0000 mg | Freq: Every day | ORAL | Status: DC
Start: 2012-05-22 — End: 2012-06-03

## 2012-05-22 NOTE — Telephone Encounter (Signed)
Wife called to report pt w/ increase soreness in mouth and she sees some white patches on roof of mouth and white coating on tongue.  Reported to Dr. Gaylyn Rong and he ordered Diflucan.  Sent Rx electronically to [facility] on [address].  Informed wife of new Rx and instructed to call back on Monday if symptoms worse.  She verbalized understanding.

## 2012-05-26 NOTE — Progress Notes (Signed)
  Radiation Oncology         [phone] ________________________________  Name: Bobby Gonzalez MRN: [id number]  Date: 05/15/2012  DOB: 05/12/1966  End of Treatment Note  Diagnosis:   Squamous cell carcinoma of the left tonsil     Indication for treatment:  Curative       Radiation treatment dates:   03/30/2012 through 05/15/2012  Site/dose:   The patient was treated with a course of IMRT on our tomotherapy unit. A PTV 6996 was contoured as the primary high-dose target region. A simultaneous integrated boost technique was used for lower dose targets as well. The patient was treated in this manner in 33 fractions at 2.12 gray per fraction to the high dose region. The patient did receive concurrent chemotherapy during this treatment. Daily image guidance was also used.  Narrative: The patient tolerated radiation treatment relatively well.   The patient exhibited some acute toxicity, especially mucositis within the oral cavity/oropharynx. The patient's skin did quite well during treatment but did exhibit some changes, especially hyperpigmentation.  Plan: The patient has completed radiation treatment. The patient will return to radiation oncology clinic for routine followup in one month. I advised the patient to call or return sooner if they have any questions or concerns related to their recovery or treatment. ________________________________  Radene Gunning, M.D., Ph.D.

## 2012-06-03 ENCOUNTER — Ambulatory Visit: Payer: BC Managed Care – PPO | Admitting: Nutrition

## 2012-06-03 ENCOUNTER — Encounter: Payer: Self-pay | Admitting: Oncology

## 2012-06-03 ENCOUNTER — Other Ambulatory Visit (HOSPITAL_BASED_OUTPATIENT_CLINIC_OR_DEPARTMENT_OTHER): Payer: BC Managed Care – PPO | Admitting: Lab

## 2012-06-03 ENCOUNTER — Telehealth: Payer: Self-pay | Admitting: Oncology

## 2012-06-03 ENCOUNTER — Ambulatory Visit (HOSPITAL_BASED_OUTPATIENT_CLINIC_OR_DEPARTMENT_OTHER): Payer: BC Managed Care – PPO | Admitting: Oncology

## 2012-06-03 VITALS — BP 104/70 | HR 69 | Temp 98.5°F | Resp 18 | Ht 70.0 in | Wt 170.5 lb

## 2012-06-03 DIAGNOSIS — K121 Other forms of stomatitis: Secondary | ICD-10-CM

## 2012-06-03 DIAGNOSIS — K1230 Oral mucositis (ulcerative), unspecified: Secondary | ICD-10-CM

## 2012-06-03 DIAGNOSIS — C099 Malignant neoplasm of tonsil, unspecified: Secondary | ICD-10-CM

## 2012-06-03 DIAGNOSIS — R093 Abnormal sputum: Secondary | ICD-10-CM

## 2012-06-03 DIAGNOSIS — N289 Disorder of kidney and ureter, unspecified: Secondary | ICD-10-CM

## 2012-06-03 DIAGNOSIS — E860 Dehydration: Secondary | ICD-10-CM

## 2012-06-03 DIAGNOSIS — R0989 Other specified symptoms and signs involving the circulatory and respiratory systems: Secondary | ICD-10-CM

## 2012-06-03 DIAGNOSIS — B977 Papillomavirus as the cause of diseases classified elsewhere: Secondary | ICD-10-CM

## 2012-06-03 LAB — CBC WITH DIFFERENTIAL/PLATELET
BASO%: 0.1 % (ref 0.0–2.0)
Basophils Absolute: 0 10*3/uL (ref 0.0–0.1)
EOS%: 0.3 % (ref 0.0–7.0)
Eosinophils Absolute: 0 10*3/uL (ref 0.0–0.5)
HCT: 32.1 % — ABNORMAL LOW (ref 38.4–49.9)
HGB: 11.3 g/dL — ABNORMAL LOW (ref 13.0–17.1)
LYMPH%: 3.9 % — ABNORMAL LOW (ref 14.0–49.0)
MCH: 32.9 pg (ref 27.2–33.4)
MCHC: 35.2 g/dL (ref 32.0–36.0)
MCV: 93.4 fL (ref 79.3–98.0)
MONO#: 0.7 10*3/uL (ref 0.1–0.9)
MONO%: 17.2 % — ABNORMAL HIGH (ref 0.0–14.0)
NEUT#: 3.2 10*3/uL (ref 1.5–6.5)
NEUT%: 78.5 % — ABNORMAL HIGH (ref 39.0–75.0)
Platelets: 183 10*3/uL (ref 140–400)
RBC: 3.44 10*6/uL — ABNORMAL LOW (ref 4.20–5.82)
RDW: 17.7 % — ABNORMAL HIGH (ref 11.0–14.6)
WBC: 4.1 10*3/uL (ref 4.0–10.3)
lymph#: 0.2 10*3/uL — ABNORMAL LOW (ref 0.9–3.3)

## 2012-06-03 LAB — COMPREHENSIVE METABOLIC PANEL (CC13)
ALT: 14 U/L (ref 0–55)
AST: 11 U/L (ref 5–34)
Albumin: 3.5 g/dL (ref 3.5–5.0)
Alkaline Phosphatase: 56 U/L (ref 40–150)
BUN: 12 mg/dL (ref 7.0–26.0)
CO2: 27 mEq/L (ref 22–29)
Calcium: 9.1 mg/dL (ref 8.4–10.4)
Chloride: 105 mEq/L (ref 98–107)
Creatinine: 0.8 mg/dL (ref 0.7–1.3)
Glucose: 94 mg/dl (ref 70–99)
Potassium: 3.9 mEq/L (ref 3.5–5.1)
Sodium: 139 mEq/L (ref 136–145)
Total Bilirubin: 0.4 mg/dL (ref 0.20–1.20)
Total Protein: 6.1 g/dL — ABNORMAL LOW (ref 6.4–8.3)

## 2012-06-03 NOTE — Progress Notes (Signed)
[facility]  [phone] [phone]   OFFICE PROGRESS NOTE   Cc:  [name], MD  DIAGNOSIS:  1. 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 Right renal mass with positive PET scan; pending evaluation and workup.  2. Right renal mass presumed to be renal cell carcinoma.   PAST THERAPY:  Concurrent chemoradiation therapy with weekly Cisplatin given 03/30/12 through 05/13/12.  CURRENT THERAPY:  1. Watchful observation for his tonsillar carcinoma. 2. Pending nephrectomy.   INTERVAL HISTORY: Bobby Gonzalez 46 y.o. male returns for regular followup visit with his wife. He is slowly recovering from his treatment.  He is no longer taking any pain medication. He has not been doing any PEG tube feedings for about 2 weeks. He is eating soft foods without difficulty. He has gained a small amount of weight. He has fatigue; however, he is able to perform activities of daily living. He is working part-time again. His left cervical lymph node has continued to decrease in size. He only has some minor skin rash at the site of radiation without open wound. He still has stable tinnitus. His hearing is stable per his report.  Patient denies fever,  headache, visual changes, confusion, drenching night sweats, palpable lymph node swelling,  jaundice, chest pain, palpitation, shortness of breath, dyspnea on exertion, productive cough, gum bleeding, epistaxis, hematemesis, hemoptysis, abdominal pain, abdominal swelling, early satiety, melena, hematochezia, hematuria, skin rash, spontaneous bleeding, joint swelling, joint pain, heat or cold intolerance, bowel bladder incontinence, back pain, focal motor weakness, paresthesia, depression, suicidal or homicidal ideation, feeling hopelessness.   Past Medical History  Diagnosis Date  . 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  Marland Kitchen HTN (hypertension)   . GERD  (gastroesophageal reflux disease)   . Allergy     pcns  . Diverticula of colon 03/10/12    multiple rectosigmoid colonic diverticula/ ct abd/pelvis  . Chronic kidney disease     right renal mass 7.3x6.7x6.8cm renal cell ca  . Cancer 02/17/12 bx    left tonsil squamous cell ca,HPV positive,,spread to left cervical node  . Renal mass 03/30/2012  . Dehydration 05/13/2012  . Phlegm in throat 05/13/2012    Past Surgical History  Procedure Date  . Wisdom tooth extraction   . Direct laryngoscopy     S/P DL, Biopsy-Dr. Pollyann Kennedy. Pathology postive for SCCa of Left Tonsil  . Gastrostomy tube placement 03/02/12  . Multiple tooth extractions 03/04/12    with DR. Mohorn  . Peg tube placement   . Tonsil biopsy 02/17/12    SCCa left tomnsil,HPV positive,spread to l cervical node    No current outpatient prescriptions on file.    ALLERGIES:  is allergic to penicillins.  REVIEW OF SYSTEMS:  The rest of the 14-point review of system was negative.   Filed Vitals:   06/03/12 1503  BP: 104/70  Pulse: 69  Temp: 98.5 F (36.9 C)  Resp: 18   Wt Readings from Last 3 Encounters:  06/03/12 170 lb 8 oz (77.338 kg)  06/03/12 171 lb (77.565 kg)  05/15/12 172 lb 14.4 oz (78.427 kg)   ECOG Performance status: 1  PHYSICAL EXAMINATION:  General: thin-appearing man, in no acute distress. Eyes: no scleral icterus. ENT: There were no oropharyngeal lesions. Neck was without thyromegaly. Lymphatics: Positive for a 1 cm left level II/III cercical node. There was no supraclavicular or axillary adenopathy. Respiratory: lungs were clear bilaterally without  wheezing or crackles. Cardiovascular: Regular rate and rhythm, S1/S2, without murmur, rub or gallop. There was no pedal edema. GI: abdomen was soft, flat, nontender, nondistended, without organomegaly.  PEG tube was dry, clean, intact. Muscoloskeletal: no spinal tenderness of palpation of vertebral spine. Skin exam was without echymosis, petichae. Neuro exam was  nonfocal. Patient was able to get on and off exam table without assistance. Gait was normal. Patient was alerted and oriented. Attention was good. Language was appropriate. Mood was normal without depression. Speech was not pressured. Thought content was not tangential.     LABORATORY/RADIOLOGY DATA:  Lab Results  Component Value Date   WBC 4.1 06/03/2012   HGB 11.3* 06/03/2012   HCT 32.1* 06/03/2012   PLT 183 06/03/2012   GLUCOSE 94 06/03/2012   ALKPHOS 56 06/03/2012   ALT 14 06/03/2012   AST 11 06/03/2012   NA 139 06/03/2012   K 3.9 06/03/2012   CL 105 06/03/2012   CREATININE 0.8 06/03/2012   BUN 12.0 06/03/2012   CO2 27 06/03/2012   INR 0.94 03/02/2012    ASSESSMENT AND PLAN:   1. Right renal mass; positive on PET scan. He is scheduled for a nephrectomy later this month by Dr Laverle Patter.   2. Left tonsil squamous cell carcinoma:  - S/P weekly cisplatin and daily XRT.  He is slowly recovering from treatment. He will have a PET scan in about 3-4 months. 3. Mucositis/xerostomia: Resolved. No longer requiring pain medications. 4.  Xerostomia: No longer requires Yankauer suction. Slowly improving.  5. nausea vomiting: None at this time. 6. Moderate Calorie/protein: Slowly gaining back his weight. He no longer requires PEG tube feedings. The patient would like to keep his PEG tube in for now. He will let us know if he wants to take this out prior to his next visit with [place]. 7. Mood:  Stable.  8. Follow up: In January 2014 to review his PET scan.    The length of time of the face-to-face encounter was 15 minutes. More than 50% of time was spent counseling and coordination of care.

## 2012-06-03 NOTE — Telephone Encounter (Signed)
appts made and printed for pt aom °

## 2012-06-04 NOTE — Progress Notes (Signed)
Bobby Gonzalez and his wife present to nutrition followup.  Patient states he is not using his feeding tube at all for nutritional support and has not for the last several weeks.  His weight was documented as 171 pounds today, which is increased from 169.4 pounds documented September 11.  He states he still cannot taste foods but he is beginning to get an appetite back.  He can eat a pretty good variety of foods and he is forcing himself to eat.  He continues to flush his feeding tube with water.  NUTRITION DIAGNOSIS:  Inadequate oral intake and inadequate enteral nutrition infusion has resolved.  INTERVENTION:  I have educated Bobby Gonzalez on how to continue to increase his oral intake to promote weight maintenance or safe weight gain.  I have given him food recommendations for him to add into his diet.  I have answered his questions.  MONITORING/EVALUATION/GOALS:  The patient has been able to increase oral intake, discontinue tube feedings and support weight maintenance.  NEXT VISIT:  There is no followup scheduled.  The patient has my contact information and agrees to call with questions.   ______________________________ Zenovia Jarred, RD, CSO, LDN Clinical Nutrition Specialist BN/MEDQ  D:  06/03/2012  T:  06/04/2012  Job:  [id number]

## 2012-06-09 ENCOUNTER — Encounter: Payer: Self-pay | Admitting: *Deleted

## 2012-06-17 ENCOUNTER — Encounter: Payer: Self-pay | Admitting: *Deleted

## 2012-06-17 NOTE — Progress Notes (Signed)
Faxed order to [facility] to d/c oral suction therapy

## 2012-06-18 NOTE — Progress Notes (Signed)
Received office notes from Dr. Lester Borden @ [facility]; forwarded to Dr. Ha. 

## 2012-06-19 ENCOUNTER — Encounter: Payer: Self-pay | Admitting: Radiation Oncology

## 2012-06-22 ENCOUNTER — Encounter: Payer: Self-pay | Admitting: Genetic Counselor

## 2012-06-22 ENCOUNTER — Other Ambulatory Visit: Payer: Self-pay | Admitting: Lab

## 2012-06-22 ENCOUNTER — Encounter (HOSPITAL_COMMUNITY): Payer: Self-pay | Admitting: Pharmacy Technician

## 2012-06-23 ENCOUNTER — Ambulatory Visit (HOSPITAL_COMMUNITY): Payer: Medicaid - Dental | Admitting: Dentistry

## 2012-06-23 ENCOUNTER — Encounter (HOSPITAL_COMMUNITY): Payer: Self-pay | Admitting: Dentistry

## 2012-06-23 VITALS — BP 129/88 | HR 88 | Temp 98.4°F

## 2012-06-23 DIAGNOSIS — K117 Disturbances of salivary secretion: Secondary | ICD-10-CM

## 2012-06-23 DIAGNOSIS — Z09 Encounter for follow-up examination after completed treatment for conditions other than malignant neoplasm: Secondary | ICD-10-CM

## 2012-06-23 DIAGNOSIS — Z85819 Personal history of malignant neoplasm of unspecified site of lip, oral cavity, and pharynx: Secondary | ICD-10-CM

## 2012-06-23 DIAGNOSIS — K1233 Oral mucositis (ulcerative) due to radiation: Secondary | ICD-10-CM

## 2012-06-23 DIAGNOSIS — R432 Parageusia: Secondary | ICD-10-CM

## 2012-06-23 NOTE — Patient Instructions (Signed)
RECOMMENDATIONS: 1. Brush after meals and at bedtime.  Use fluoride at bedtime. 2. Use trismus exercises as directed. 3. Use Biotene Rinse or salt water/baking soda rinses. 4. Multiple sips of water as needed. 5. Followup with Dr. Laverle Patter for evaluation for surgical resection of kidney tumor. Patient to mention vesicles of the back of throat at that time. 6. Discussion of vesicles with Dr. Mitzi Hansen at his appointment this coming Thursday. 7. Followup a primary dentist, Dr. Rocco Pauls, for an exam and cleaning in January 2014.

## 2012-06-23 NOTE — Progress Notes (Signed)
06/23/2012  Patient:            Bobby Gonzalez Date of Birth:  September 17, 1965 MRN:                [id number]  BP 129/88  Pulse 88  Temp 98.4 F (36.9 C) (Oral)   Bobby Gonzalez is a 46 year old male recently diagnosed squamous cell carcinoma the left tonsil. Patient underwent chemoradiation therapy from 03/10/2012 through 9 /13/2103. The patient now presents for periodic oral examination after radiation therapy.  The patient has and anticipated kidney tumor resection this coming Monday, 06/21/2012 with Dr. Laverle Patter.  REVIEW OF CHIEF COMPLAINTS:  DRY MOUTH: Incipient xerostomia HARD TO SWALLOW: No  HURT TO SWALLOW: No TASTE CHANGES: No taste is present SORES IN MOUTH: Back of throat. TRISMUS: No symptoms WEIGHT: 167 pounds  HOME OH REGIMEN:  BRUSHING: 3-4 times a day FLOSSING: Yes RINSING: Biotene  And saltwater rinses FLUORIDE: At bedtime usually. Patient was instructed to do fluoride use at bedtime nightly. TRISMUS EXERCISES:  Maximum interincisal opening: 43 mm   DENTAL EXAM:  Oral Hygiene:(PLAQUE): Very good oral hygiene LOCATION OF MUCOSITIS: Back of throat show some vesicles. I am uncertain of the etiology. May represent mucositis or may represent some viral etiology. Currently no ulcerations are noted today. DESCRIPTION OF SALIVA: Decreased. Incipient xerostomia. ANY EXPOSED BONE: None noted OTHER WATCHED AREAS: Back of throat as discussed above. DX: Xerostomia, Dysgeusia, Mucositis and vessicles at the back of throat  RECOMMENDATIONS: 1. Brush after meals and at bedtime.  Use fluoride at bedtime. 2. Use trismus exercises as directed. 3. Use Biotene Rinse or salt water/baking soda rinses. 4. Multiple sips of water as needed. 5. Followup with Dr. Laverle Patter for evaluation for surgical resection of kidney tumor. Patient to mention vesicles of the back of throat at that time. 6. Discussion of vesicles with Dr. Mitzi Hansen at his appointment this coming Thursday. 7. Followup a primary  dentist, Dr. Rocco Pauls, for an exam and cleaning in January 2014. Call if problems before then.  Bobby Gonzalez 06/23/2012

## 2012-06-24 ENCOUNTER — Encounter: Payer: Self-pay | Admitting: Oncology

## 2012-06-24 NOTE — Progress Notes (Signed)
Faxed pathology report to [facility] @ [phone].

## 2012-06-25 ENCOUNTER — Encounter: Payer: Self-pay | Admitting: Radiation Oncology

## 2012-06-25 ENCOUNTER — Ambulatory Visit (HOSPITAL_COMMUNITY)
Admission: RE | Admit: 2012-06-25 | Discharge: 2012-06-25 | Disposition: A | Discharge status: Home / Self Care | Payer: BC Managed Care – PPO | Source: Ambulatory Visit | Attending: Urology | Admitting: Urology

## 2012-06-25 ENCOUNTER — Ambulatory Visit
Admission: RE | Admit: 2012-06-25 | Discharge: 2012-06-25 | Disposition: A | Discharge status: Home / Self Care | Payer: BC Managed Care – PPO | Source: Ambulatory Visit | Attending: Radiation Oncology | Admitting: Radiation Oncology

## 2012-06-25 ENCOUNTER — Encounter (HOSPITAL_COMMUNITY): Payer: Self-pay

## 2012-06-25 ENCOUNTER — Other Ambulatory Visit: Payer: Self-pay

## 2012-06-25 ENCOUNTER — Encounter (HOSPITAL_COMMUNITY)
Admission: RE | Admit: 2012-06-25 | Discharge: 2012-06-25 | Disposition: A | Discharge status: Home / Self Care | Payer: BC Managed Care – PPO | Source: Ambulatory Visit | Attending: Urology | Admitting: Urology

## 2012-06-25 VITALS — BP 126/81 | HR 81 | Temp 98.4°F | Resp 20 | Wt 172.8 lb

## 2012-06-25 DIAGNOSIS — C099 Malignant neoplasm of tonsil, unspecified: Secondary | ICD-10-CM

## 2012-06-25 DIAGNOSIS — I10 Essential (primary) hypertension: Secondary | ICD-10-CM

## 2012-06-25 DIAGNOSIS — C649 Malignant neoplasm of unspecified kidney, except renal pelvis: Secondary | ICD-10-CM | POA: Insufficient documentation

## 2012-06-25 DIAGNOSIS — Z01818 Encounter for other preprocedural examination: Secondary | ICD-10-CM | POA: Insufficient documentation

## 2012-06-25 HISTORY — PX: PEG TUBE PLACEMENT: SUR1034

## 2012-06-25 HISTORY — DX: Essential (primary) hypertension: I10

## 2012-06-25 LAB — CBC
HCT: 34.8 % — ABNORMAL LOW (ref 39.0–52.0)
Hemoglobin: 12.4 g/dL — ABNORMAL LOW (ref 13.0–17.0)
MCH: 32.9 pg (ref 26.0–34.0)
MCHC: 35.6 g/dL (ref 30.0–36.0)
MCV: 92.3 fL (ref 78.0–100.0)
Platelets: 210 10*3/uL (ref 150–400)
RBC: 3.77 MIL/uL — ABNORMAL LOW (ref 4.22–5.81)
RDW: 14 % (ref 11.5–15.5)
WBC: 5.1 10*3/uL (ref 4.0–10.5)

## 2012-06-25 LAB — COMPREHENSIVE METABOLIC PANEL
ALT: 10 U/L (ref 0–53)
AST: 12 U/L (ref 0–37)
Albumin: 3.8 g/dL (ref 3.5–5.2)
Alkaline Phosphatase: 56 U/L (ref 39–117)
BUN: 15 mg/dL (ref 6–23)
CO2: 29 mEq/L (ref 19–32)
Calcium: 9.4 mg/dL (ref 8.4–10.5)
Chloride: 100 mEq/L (ref 96–112)
Creatinine, Ser: 0.81 mg/dL (ref 0.50–1.35)
GFR calc Af Amer: >90 mL/min (ref >90)
GFR calc non Af Amer: >90 mL/min (ref >90)
Glucose, Bld: 150 mg/dL — ABNORMAL HIGH (ref 70–99)
Potassium: 3.3 mEq/L — ABNORMAL LOW (ref 3.5–5.1)
Sodium: 137 mEq/L (ref 135–145)
Total Bilirubin: 0.4 mg/dL (ref 0.3–1.2)
Total Protein: 6.7 g/dL (ref 6.0–8.3)

## 2012-06-25 LAB — SURGICAL PCR SCREEN
MRSA, PCR: NEGATIVE
Staphylococcus aureus: NEGATIVE

## 2012-06-25 NOTE — Patient Instructions (Addendum)
20 Bobby Gonzalez  06/25/2012   Your procedure is scheduled on: 10-28  -2013  Report to [facility] at       0900 AM   Call this number if you have problems the morning of surgery: [phone]  Or Presurgical Testing [phone](Reed Eifert)   Remember:   Do not eat food:After Midnight.   Take these medicines the morning of surgery with A SIP OF WATER: none   Do not wear jewelry, make-up or nail polish.  Do not wear lotions, powders, or perfumes. You may wear deodorant.  Do not shave 48 hours prior to surgery.(face and neck okay, no shaving of legs)  Do not bring valuables to the hospital.  Contacts, dentures or bridgework may not be worn into surgery.  Leave suitcase in the car. After surgery it may be brought to your room.  For patients admitted to the hospital, checkout time is 11:00 AM the day of discharge.   Patients discharged the day of surgery will not be allowed to drive home. Must have responsible person with you x 24 hours once discharged.  Name and phone number of your driver: Bobby Gonzalez -spouse [phone] cell  Special Instructions: CHG Shower Use Special Wash: see special instruction sheet.(avoid face and genitals)   Please read over the following fact sheets that you were given: MRSA Information, Blood Transfusion fact sheet, Incentive Spirometry Instruction.

## 2012-06-25 NOTE — Progress Notes (Signed)
Patient  Here  F/u s/p rad tx left tonsil=03/30/12-05/15/12  still no taste, has afew sores in mouth and tongue, no c/o pain, wakes up thick saliva,  Rinses with water, hasn't been using biotene ,will start  Throughout the day  With biotene, saw Dr.Kulinski this past Tuesday,, fatigue mild, back at working  32-35 hours weekly, forces himself to eat,  Still has peg tube,  Scheduled for right nephrectomy 06/29/12 with Dr.Borden,  Pet scheduled 09/07/12, spech appt with Verdie Mosher 07/07/12, aned Dr. Gaylyn Rong 09/09/12 no c/o pain, no nausea 11:07 AM

## 2012-06-26 ENCOUNTER — Encounter (HOSPITAL_COMMUNITY): Payer: Self-pay | Admitting: Dentistry

## 2012-06-26 NOTE — H&P (Signed)
History of Present Illness  Mr. 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  He has undergone treatment with chemotherapy (cisplatin-based) and radiation. During his evaluation for his tonsillar malignancy, he underwent a PET scan as part of his staging evaluation and was noted to have a mass of the right kidney which prompted a urologic evaluation and dedicated CT scan of the abdomen with and without contrast on 03/10/12. This revealed a large, centrally located 7.3 x 6.8 cm enhancing right renal neoplasm concerning for renal cell carcinoma.  The mass was central and abuts the renal hilum.  On delayed images, this mass was clearly displacing but not originating from the renal collecting system and it does have characteristics consistent with renal cell carcinoma.  There is no regional lymphadenopathy, renal vein invasion, contralateral renal masses, adrenal masses, or other evidence for metastatic disease. He has been asymptomatic from this mass and denies flank pain or hematuria. He has a paternal family history of kidney cancer and his father had undergone nephrectomy for treatment about 10 years ago and has been NED since.  His baseline Cr is 0.88 indicating normal renal function.  His case has been presented in the [facility] multidisciplinary conference and I have spoken with Dr. Gaylyn Rong.  He has been receiving treatment for his SCCA into the early part of September and we discussed that it may be most appropriate to consider surgical treatment of his renal mass in late October or early November assuming he has recovered well from his current treatment for SCCA.  Interval history:  He follows up today in preparation for his upcoming right laparoscopic radical nephrectomy. He has recovered well following his systemic chemotherapy for his squamous cell carcinoma of the left tonsil. His last chemotherapy treatment was completed on 05/13/12. At this time, he is  eating and not lying on his gastrostomy tube for nutrition. He has gained 12 pounds since completing chemotherapy. He continues to have a poor appetite but has forced himself to eat and is found foods that he can tolerate.   Past Medical History Problems  1. History of  Heartburn 787.1 2. History of  Malignant Neoplasm Of The Tonsil (Oropharyngeal) V10.02  Surgical History Problems  1. History of  Biopsy Oropharynx Tonsillar Pillars 2. History of  Direct Laryngoscopy With Biopsy 3. History of  Gastric Surgery Feeding Gastrostomy 4. History of  Oral Surgery  Current Meds 1. Acetaminophen-Codeine 120-12 MG/5ML Oral Solution; Therapy: 12Aug2013 to 2. Hydrocodone-Acetaminophen 7.5-500 MG Oral Tablet; Therapy: (Recorded:05Jul2013) to 3. Ibuprofen TABS; Therapy: (Recorded:05Jul2013) to 4. LORazepam 0.5 MG Oral Tablet; Therapy: 08Jul2013 to 5. Multi-Day TABS; Therapy: (Recorded:05Jul2013) to  Allergies Medication  1. Penicillins  Family History Problems  1. Paternal history of  Hypertension V17.49 2. Paternal history of  Kidney Cancer V16.51 3. Family history of  Mother Deceased At Age 48 4. Paternal history of  Stroke Syndrome V17.1  Social History Problems  1. Marital History - Currently Married 2. Never A Smoker Denied  3. History of  Alcohol Use  Review of Systems  Genitourinary: no hematuria.  Gastrointestinal: no nausea and no vomiting.    Vitals Vital Signs [Data Includes: Last 1 Day]  16Oct2013 01:00PM  BMI Calculated: 24.2 BSA Calculated: 1.94 Height: 5 ft 10 in Weight: 169 lb  Blood Pressure: 125 / 79 Temperature: 98.4 F Heart Rate: 69  Physical Exam Constitutional: Well nourished and well developed . No acute distress.  ENT:.  The ears and nose are normal in appearance.  Neck: The appearance of the neck is normal and no neck mass is present.  Pulmonary: No respiratory distress, normal respiratory rhythm and effort and clear bilateral breath sounds.    Cardiovascular: Heart rate and rhythm are normal . No peripheral edema.  Abdomen: The abdomen is soft and nontender. No masses are palpated. No CVA tenderness. No hernias are palpable. No hepatosplenomegaly noted. He has a left upper quadrant gastrostomy tube.  Lymphatics: The supraclavicular, femoral and inguinal nodes are not enlarged or tender.  Skin: Normal skin turgor, no visible rash and no visible skin lesions.  Neuro/Psych:. Mood and affect are appropriate.    Results/Data Urine [Data Includes: Last 1 Day]   16Oct2013  COLOR YELLOW   APPEARANCE CLEAR   SPECIFIC GRAVITY 1.025   pH 6.0   GLUCOSE NEG mg/dL  BILIRUBIN NEG   KETONE NEG mg/dL  BLOOD TRACE   PROTEIN NEG mg/dL  UROBILINOGEN 0.2 mg/dL  NITRITE NEG   LEUKOCYTE ESTERASE NEG   SQUAMOUS EPITHELIAL/HPF RARE   WBC NONE SEEN WBC/hpf  RBC 0-2 RBC/hpf  BACTERIA NONE SEEN   CRYSTALS NONE SEEN   CASTS NONE SEEN   Other MUCUS   Selected Results  CT-CHEST&ABD WITH CONTRAST 15Oct2013 12:00AM Heloise Purpura   Test Name Result Flag Reference  ** RADIOLOGY REPORT BY [facility] **   *RADIOLOGY REPORT*  Clinical Data: Follow up renal cell carcinoma  CT CHEST AND ABDOMEN WITH CONTRAST  Technique: Multidetector CT imaging of the chest and abdomen was performed following the standard protocol during bolus administration of intravenous contrast.  Contrast: 125 ml of Isovue  Comparison: 03/10/2012  CT CHEST  Findings: No axillary or supraclavicular adenopathy. There is no mediastinal or hilar adenopathy. There is no pericardial effusion.  No pleural effusion noted. Small nodule in the right upper lobe is identified measuring 3 mm, image 28. Not seen on previous exam. No additional pulmonary nodules or masses identified.  Review of the visualized osseous structures is negative for aggressive lytic or sclerotic bone lesions. There is a curvature of the thoracic and lumbar spine which is convex to the  right.  IMPRESSION:  1. No acute cardiopulmonary abnormalities. 2. Small, 3 mm right upper lobe nodule is indeterminate. Attention on follow-up imaging is advised.  CT ABDOMEN  Findings: There are no suspicious liver abnormalities. The gallbladder appears normal. No biliary dilatation. Normal appearance of the pancreas. The spleen is negative.  Both of the adrenal glands are normal. Again identified is a large mid right renal mass consistent with renal cell carcinoma. This measures 6.7 x 6.4 x 6.3 cm. Normal appearance of the left kidney. The renal veins are patent.  No adenopathy within the upper abdomen. There is no ascites. There is a gastrostomy tube noted in the left upper quadrant. Small bowel loops are unremarkable.  Review of the visualized bony structures is negative.  IMPRESSION:  1. Similar appearance of large right renal mass consistent with renal cell carcinoma.  2. No evidence for metastatic disease.   Original Report Authenticated By: Rosealee Albee, M.D.   COMPREHENSIVE METABOLIC PANEL 14Oct2013 09:08AM Heloise Purpura  SPECIMEN TYPE: BLOOD   Test Name Result Flag Reference  GLUCOSE 142 mg/dL H 24-40  BUN 6 mg/dL  1-02  CREATININE 7.25 mg/dL  3.66-4.40  SODIUM 347 mEq/L  135-145  POTASSIUM 3.8 mEq/L  3.5-5.3  CHLORIDE 103 mEq/L  96-112  CO2 28 mEq/L  19-32  CALCIUM 9.3  mg/dL  1.6-10.9  TOTAL PROTEIN 6.1 g/dL  6.0-4.5  ALBUMIN 4.0 g/dL  4.0-9.8  AST/SGOT 13 U/L  0-37  ALT/SGPT 11 U/L  0-53  ALKALINE PHOSPHATASE 46 U/L  39-117  BILIRUBIN, TOTAL 0.6 mg/dL  1.1-9.1  Est GFR, African American >89 mL/min    Est GFR, NonAfrican American >89 mL/min    The estimated GFR is a calculation valid for adults (28 to 45 years old) that uses the CKD-EPI algorithm to adjust for age and sex. It is not to be used for children, pregnant women, hospitalized patients, patients on dialysis, or with rapidly changing kidney function. According to the NKDEP, eGFR >89  is normal, 60-89 shows mild impairment, 30-59 shows moderate impairment, 15-29 shows severe impairment and <15 is ESRD.   CBC w/PLT NO DIFF 14Oct2013 09:07AM Heloise Purpura  SPECIMEN TYPE: BLOOD   Test Name Result Flag Reference  WBC 3.5 K/uL L 4.0-10.5  RBC 3.90 MIL/uL L 4.22-5.81  HEMOGLOBIN 12.2 g/dL L 47.8-29.5  HEMATOCRIT 35.0 % L 39.0-52.0  MCV 89.7 fL  78.0-100.0  MCH 31.3 pg  26.0-34.0  MCHC 34.9 g/dL  62.1-30.8  RDW 65.7 % H 11.5-15.5  PLATELET COUNT 218 K/uL  150-400     I independently reviewed his CT scan of the chest and abdomen. Findings are as dictated above. He appears to have a localized and mostly unchanged right renal neoplasm. This remains concerning for renal cell carcinoma. Is a small indeterminate 3 mm right upper lobe nodule which is nonspecific and unlikely to represent metastatic disease but will need to be followed postoperatively.  Assessment Assessed  1. Renal Neoplasm 239.5  Discussion/Summary  1. Right renal neoplasm concerning for malignancy: He appears to be ready to proceed with a right laparoscopic radical nephrectomy. We have reviewed the potential risks and complications as well as expected recovery process associated with this procedure as we had previously. All questions were answered to his stated satisfaction. He agrees to proceed and gives his informed consent. He appears medically ready to proceed with his upcoming surgery.  2. Small right pulmonary nodule: Statistically, this would be unlikely to represent metastatic renal cell carcinoma. However, even in the face of limited pulmonary metastases, I would recommend proceeding with a nephrectomy and so this would not change our approach. He will undergo surveillance chest imaging in the future for surveillance of this small indeterminate nodule.  CC: Dr. Jethro Bolus Dr. Dorothy Puffer Dr. Holley Bouche   A total of 26 minutes were spent in the overall care of the patient today with 20 minutes in  direct face to face consultation.    Signatures Electronically signed by : Heloise Purpura, M.D.; Jun 17 2012  1:28PM

## 2012-06-26 NOTE — Progress Notes (Signed)
Radiation Oncology         [phone] ________________________________  Name: Bobby Gonzalez MRN: [id number]  Date: 06/25/2012  DOB: 02-Aug-1966  Follow-Up Visit Note  CC: Bobby Blamer, MD  Serena Colonel, MD  Diagnosis:   Squamous cell carcinoma of the left tonsil  Interval Since Last Radiation:  One month   Narrative:  The patient returns today for routine follow-up.  The patient indicates that he has done reasonably well since he finished treatment on 05/15/2012. His skin has markedly improved as has his soreness of the mouth. However he continues to have ongoing changes, most prominently a change in taste and he really describes no taste at this point. He does have some occasional soreness of the throat. He has been seen by dentistry and will begin using by 18 more frequently. Some fatigue but he has begun working again averaging maybe 35 hours per week. He has a PET scan scheduled in January of 2014. The patient is scheduled for a right nephrectomy next week which was initiated that was discovered that needed to be addressed prior to his head neck treatment. The patient has also noticed some fever blisters which have been small and scattered which have been increased since he finished.                              ALLERGIES:  is allergic to banana and penicillins.  Meds: Current Outpatient Prescriptions  Medication Sig Dispense Refill  . Multiple Vitamin (MULTIVITAMIN WITH MINERALS) TABS Take 1 tablet by mouth daily.        Physical Findings: The patient is in no acute distress. Patient is alert and oriented.  weight is 172 lb 12.8 oz (78.382 kg). His oral temperature is 98.4 F (36.9 C). His blood pressure is 126/81 and his pulse is 81. His respiration is 20. .   The patient's skin looks quite good and is healed well since he finished treatment. No ongoing desquamation. The mucositis has resolved in the oral cavity. No significant active fever blisters at this time although there  are some scattered very small areas which appear to be healing. This does not look worrisome currently.  Lab Findings: Lab Results  Component Value Date   WBC 5.1 06/25/2012   HGB 12.4* 06/25/2012   HCT 34.8* 06/25/2012   MCV 92.3 06/25/2012   PLT 210 06/25/2012     Radiographic Findings: Dg Chest 2 View  06/25/2012  *RADIOLOGY REPORT*  Clinical Data: Preoperative assessment for right nephrectomy, history of squamous cell carcinoma of the left neck  CHEST - 2 VIEW  Comparison: None  Findings: Normal heart size, mediastinal contours, and pulmonary vascularity. Lungs clear. No pleural effusion or pneumothorax. No acute osseous findings. Incidental note of a gastrostomy tube in the left upper quadrant.  IMPRESSION: Normal exam.   Original Report Authenticated By: Lollie Marrow, M.D.     Impression:    The patient is a 46 year old tail status post chemoradiotherapy for tonsillar cancer. He has done well since he finished approximately one month ago. He has had somewhat of a long road with a right nephrectomy planned at this point. I believe that the patient is fine to proceed with this.  Plan:  He has additional followup appointments planned including a PET scan in January 2014. I will have him return to our clinic in 2 months which will be several weeks before this.   Radene Gunning, M.D.,  Ph.D.

## 2012-06-29 ENCOUNTER — Ambulatory Visit (HOSPITAL_COMMUNITY): Payer: BC Managed Care – PPO | Admitting: Anesthesiology

## 2012-06-29 ENCOUNTER — Encounter (HOSPITAL_COMMUNITY): Payer: Self-pay | Admitting: Anesthesiology

## 2012-06-29 ENCOUNTER — Encounter (HOSPITAL_COMMUNITY): Payer: Self-pay

## 2012-06-29 ENCOUNTER — Encounter (HOSPITAL_COMMUNITY)
Admission: RE | Disposition: A | Discharge status: Home / Self Care | Payer: Self-pay | Source: Ambulatory Visit | Attending: Urology

## 2012-06-29 ENCOUNTER — Inpatient Hospital Stay (HOSPITAL_COMMUNITY)
Admission: RE | Admit: 2012-06-29 | Discharge: 2012-07-01 | DRG: 303 | Disposition: A | Discharge status: Home / Self Care | Payer: BC Managed Care – PPO | Source: Ambulatory Visit | Attending: Urology | Admitting: Urology

## 2012-06-29 DIAGNOSIS — K219 Gastro-esophageal reflux disease without esophagitis: Secondary | ICD-10-CM | POA: Diagnosis present

## 2012-06-29 DIAGNOSIS — I10 Essential (primary) hypertension: Secondary | ICD-10-CM | POA: Diagnosis present

## 2012-06-29 DIAGNOSIS — C099 Malignant neoplasm of tonsil, unspecified: Secondary | ICD-10-CM | POA: Diagnosis present

## 2012-06-29 DIAGNOSIS — C649 Malignant neoplasm of unspecified kidney, except renal pelvis: Principal | ICD-10-CM | POA: Diagnosis present

## 2012-06-29 DIAGNOSIS — R12 Heartburn: Secondary | ICD-10-CM | POA: Diagnosis present

## 2012-06-29 HISTORY — PX: LAPAROSCOPIC NEPHRECTOMY: SHX1930

## 2012-06-29 LAB — ABO/RH: ABO/RH(D): O POS

## 2012-06-29 LAB — BASIC METABOLIC PANEL
BUN: 10 mg/dL (ref 6–23)
CO2: 26 mEq/L (ref 19–32)
Calcium: 8.5 mg/dL (ref 8.4–10.5)
Chloride: 101 mEq/L (ref 96–112)
Creatinine, Ser: 0.93 mg/dL (ref 0.50–1.35)
GFR calc Af Amer: >90 mL/min (ref >90)
GFR calc non Af Amer: >90 mL/min (ref >90)
Glucose, Bld: 117 mg/dL — ABNORMAL HIGH (ref 70–99)
Potassium: 3.7 mEq/L (ref 3.5–5.1)
Sodium: 136 mEq/L (ref 135–145)

## 2012-06-29 LAB — TYPE AND SCREEN
ABO/RH(D): O POS
Antibody Screen: NEGATIVE

## 2012-06-29 LAB — HEMOGLOBIN AND HEMATOCRIT, BLOOD
HCT: 32.6 % — ABNORMAL LOW (ref 39.0–52.0)
Hemoglobin: 11.8 g/dL — ABNORMAL LOW (ref 13.0–17.0)

## 2012-06-29 SURGERY — NEPHRECTOMY, RADICAL, LAPAROSCOPIC, ADULT
Anesthesia: General | Laterality: Right | Wound class: Clean

## 2012-06-29 MED ORDER — BUPIVACAINE LIPOSOME 1.3 % IJ SUSP
INTRAMUSCULAR | Status: DC | PRN
  Administered 2012-06-29: 20 mL

## 2012-06-29 MED ORDER — DIPHENHYDRAMINE HCL 50 MG/ML IJ SOLN: 12.5000 mg | Freq: Four times a day (QID) | INTRAMUSCULAR | Status: DC | PRN

## 2012-06-29 MED ORDER — DEXTROSE-NACL 5-0.45 % IV SOLN
INTRAVENOUS | Status: DC
  Administered 2012-06-29 – 2012-06-30 (×3): via INTRAVENOUS

## 2012-06-29 MED ORDER — ONDANSETRON HCL 4 MG/2ML IJ SOLN
INTRAMUSCULAR | Status: DC | PRN
  Administered 2012-06-29: 4 mg via INTRAVENOUS

## 2012-06-29 MED ORDER — VANCOMYCIN HCL IN DEXTROSE 1-5 GM/200ML-% IV SOLN
INTRAVENOUS | Status: AC
  Filled 2012-06-29: qty 200

## 2012-06-29 MED ORDER — ONDANSETRON HCL 4 MG/2ML IJ SOLN: 4.0000 mg | INTRAMUSCULAR | Status: DC | PRN

## 2012-06-29 MED ORDER — DOCUSATE SODIUM 100 MG PO CAPS
100.0000 mg | ORAL_CAPSULE | Freq: Two times a day (BID) | ORAL | Status: DC
  Administered 2012-06-29 – 2012-07-01 (×2): 100 mg via ORAL
  Filled 2012-06-29 (×5): qty 1

## 2012-06-29 MED ORDER — PROPOFOL 10 MG/ML IV BOLUS
INTRAVENOUS | Status: DC | PRN
  Administered 2012-06-29: 150 mg via INTRAVENOUS

## 2012-06-29 MED ORDER — HYDROMORPHONE HCL PF 1 MG/ML IJ SOLN
0.2500 mg | INTRAMUSCULAR | Status: DC | PRN
  Administered 2012-06-29 (×3): 0.5 mg via INTRAVENOUS

## 2012-06-29 MED ORDER — ACETAMINOPHEN 10 MG/ML IV SOLN
INTRAVENOUS | Status: AC
  Filled 2012-06-29: qty 100

## 2012-06-29 MED ORDER — LACTATED RINGERS IV SOLN
INTRAVENOUS | Status: DC
  Administered 2012-06-29 (×3): via INTRAVENOUS
  Administered 2012-06-29: 1000 mL via INTRAVENOUS

## 2012-06-29 MED ORDER — INFLUENZA VIRUS VACC SPLIT PF IM SUSP
0.5000 mL | INTRAMUSCULAR | Status: AC
  Administered 2012-06-30: 0.5 mL via INTRAMUSCULAR
  Filled 2012-06-29: qty 0.5

## 2012-06-29 MED ORDER — PNEUMOCOCCAL VAC POLYVALENT 25 MCG/0.5ML IJ INJ
0.5000 mL | INJECTION | INTRAMUSCULAR | Status: AC
  Filled 2012-06-29 (×2): qty 0.5

## 2012-06-29 MED ORDER — DIPHENHYDRAMINE HCL 12.5 MG/5ML PO ELIX: 12.5000 mg | ORAL_SOLUTION | Freq: Four times a day (QID) | ORAL | Status: DC | PRN

## 2012-06-29 MED ORDER — VANCOMYCIN HCL IN DEXTROSE 1-5 GM/200ML-% IV SOLN
1000.0000 mg | INTRAVENOUS | Status: AC
  Administered 2012-06-29: 1000 mg via INTRAVENOUS

## 2012-06-29 MED ORDER — SUCCINYLCHOLINE CHLORIDE 20 MG/ML IJ SOLN
INTRAMUSCULAR | Status: DC | PRN
  Administered 2012-06-29: 160 mg via INTRAVENOUS

## 2012-06-29 MED ORDER — CISATRACURIUM BESYLATE (PF) 10 MG/5ML IV SOLN
INTRAVENOUS | Status: DC | PRN
  Administered 2012-06-29: 4 mg via INTRAVENOUS
  Administered 2012-06-29: 8 mg via INTRAVENOUS

## 2012-06-29 MED ORDER — HYDROMORPHONE HCL PF 1 MG/ML IJ SOLN
0.5000 mg | INTRAMUSCULAR | Status: DC | PRN
  Administered 2012-06-29: 1 mg via INTRAVENOUS
  Administered 2012-06-29: 0.5 mg via INTRAVENOUS
  Administered 2012-06-30: 1 mg via INTRAVENOUS
  Filled 2012-06-29 (×3): qty 1

## 2012-06-29 MED ORDER — FENTANYL CITRATE 0.05 MG/ML IJ SOLN
INTRAMUSCULAR | Status: DC | PRN
  Administered 2012-06-29 (×2): 50 ug via INTRAVENOUS
  Administered 2012-06-29: 100 ug via INTRAVENOUS
  Administered 2012-06-29: 50 ug via INTRAVENOUS

## 2012-06-29 MED ORDER — MIDAZOLAM HCL 5 MG/5ML IJ SOLN
INTRAMUSCULAR | Status: DC | PRN
  Administered 2012-06-29: 2 mg via INTRAVENOUS

## 2012-06-29 MED ORDER — LACTATED RINGERS IV SOLN: INTRAVENOUS | Status: DC

## 2012-06-29 MED ORDER — ACETAMINOPHEN 10 MG/ML IV SOLN
INTRAVENOUS | Status: DC | PRN
  Administered 2012-06-29: 1000 mg via INTRAVENOUS

## 2012-06-29 MED ORDER — VANCOMYCIN HCL IN DEXTROSE 1-5 GM/200ML-% IV SOLN
1000.0000 mg | Freq: Two times a day (BID) | INTRAVENOUS | Status: AC
  Administered 2012-06-29: 1000 mg via INTRAVENOUS
  Filled 2012-06-29 (×2): qty 200

## 2012-06-29 MED ORDER — ZOLPIDEM TARTRATE 5 MG PO TABS: 5.0000 mg | ORAL_TABLET | Freq: Every evening | ORAL | Status: DC | PRN

## 2012-06-29 MED ORDER — LACTATED RINGERS IR SOLN
Status: DC | PRN
  Administered 2012-06-29: 1000 mL

## 2012-06-29 MED ORDER — ACETAMINOPHEN 10 MG/ML IV SOLN
1000.0000 mg | Freq: Four times a day (QID) | INTRAVENOUS | Status: DC
  Administered 2012-06-29 – 2012-06-30 (×3): 1000 mg via INTRAVENOUS
  Filled 2012-06-29 (×4): qty 100

## 2012-06-29 MED ORDER — BUPIVACAINE LIPOSOME 1.3 % IJ SUSP
20.0000 mL | INTRAMUSCULAR | Status: AC
  Filled 2012-06-29: qty 20

## 2012-06-29 MED ORDER — HYDROMORPHONE HCL PF 1 MG/ML IJ SOLN
INTRAMUSCULAR | Status: AC
Start: 2012-06-29 — End: 2012-06-29
  Filled 2012-06-29: qty 1

## 2012-06-29 MED ORDER — HYDROMORPHONE HCL PF 1 MG/ML IJ SOLN
INTRAMUSCULAR | Status: AC
  Filled 2012-06-29: qty 1

## 2012-06-29 SURGICAL SUPPLY — 63 items
BAG ZIPLOCK 12X15 (MISCELLANEOUS) IMPLANT
BENZOIN TINCTURE PRP APPL 2/3 (GAUZE/BANDAGES/DRESSINGS) IMPLANT
BLADE EXTENDED COATED 6.5IN (ELECTRODE) IMPLANT
BLADE SURG SZ10 CARB STEEL (BLADE) IMPLANT
CANISTER SUCTION 2500CC (MISCELLANEOUS) ×2 IMPLANT
CHLORAPREP W/TINT 26ML (MISCELLANEOUS) ×2 IMPLANT
CLIP LIGATING HEM O LOK PURPLE (MISCELLANEOUS) ×2 IMPLANT
CLIP LIGATING HEMO LOK XL GOLD (MISCELLANEOUS) IMPLANT
CLIP LIGATING HEMO O LOK GREEN (MISCELLANEOUS) ×8 IMPLANT
CLOTH BEACON ORANGE TIMEOUT ST (SAFETY) ×2 IMPLANT
COVER SURGICAL LIGHT HANDLE (MISCELLANEOUS) ×2 IMPLANT
CUTTER FLEX LINEAR 45M (STAPLE) ×2 IMPLANT
DERMABOND ADVANCED (GAUZE/BANDAGES/DRESSINGS) ×1
DERMABOND ADVANCED .7 DNX12 (GAUZE/BANDAGES/DRESSINGS) ×1 IMPLANT
DRAIN CHANNEL 10F 3/8 F FF (DRAIN) IMPLANT
DRAPE CAMERA CLOSED 9X96 (DRAPES) ×2 IMPLANT
DRAPE INCISE IOBAN 66X45 STRL (DRAPES) ×2 IMPLANT
DRAPE LAPAROSCOPIC ABDOMINAL (DRAPES) ×2 IMPLANT
DRAPE WARM FLUID 44X44 (DRAPE) ×2 IMPLANT
DRSG TEGADERM 4X4.75 (GAUZE/BANDAGES/DRESSINGS) IMPLANT
ELECT REM PT RETURN 9FT ADLT (ELECTROSURGICAL) ×2
ELECTRODE REM PT RTRN 9FT ADLT (ELECTROSURGICAL) ×1 IMPLANT
EVACUATOR SILICONE 100CC (DRAIN) IMPLANT
FLOSEAL 10ML (HEMOSTASIS) ×2 IMPLANT
GLOVE BIOGEL M STRL SZ7.5 (GLOVE) ×4 IMPLANT
GOWN STRL NON-REIN LRG LVL3 (GOWN DISPOSABLE) ×4 IMPLANT
HEMOSTAT SURGICEL 4X8 (HEMOSTASIS) IMPLANT
KIT BASIN OR (CUSTOM PROCEDURE TRAY) ×2 IMPLANT
PENCIL BUTTON HOLSTER BLD 10FT (ELECTRODE) ×2 IMPLANT
POSITIONER SURGICAL ARM (MISCELLANEOUS) IMPLANT
POUCH ENDO CATCH II 15MM (MISCELLANEOUS) ×2 IMPLANT
RELOAD 45 VASCULAR/THIN (ENDOMECHANICALS) ×2 IMPLANT
RETRACTOR LAPSCP 12X46 CVD (ENDOMECHANICALS) IMPLANT
RTRCTR LAPSCP 12X46 CVD (ENDOMECHANICALS)
SCALPEL HARMONIC ACE (MISCELLANEOUS) ×2 IMPLANT
SCISSORS LAP 5X35 DISP (ENDOMECHANICALS) IMPLANT
SET IRRIG TUBING LAPAROSCOPIC (IRRIGATION / IRRIGATOR) ×2 IMPLANT
SOLUTION ANTI FOG 6CC (MISCELLANEOUS) ×2 IMPLANT
SPONGE GAUZE 4X4 12PLY (GAUZE/BANDAGES/DRESSINGS) ×2 IMPLANT
SPONGE LAP 18X18 X RAY DECT (DISPOSABLE) IMPLANT
SPONGE LAP 4X18 X RAY DECT (DISPOSABLE) IMPLANT
SPONGE SURGIFOAM ABS GEL 100 (HEMOSTASIS) IMPLANT
STRIP CLOSURE SKIN 1/4X4 (GAUZE/BANDAGES/DRESSINGS) IMPLANT
SURGIFLO W/THROMBIN 8M KIT (HEMOSTASIS) IMPLANT
SUT ETHILON 3 0 PS 1 (SUTURE) IMPLANT
SUT MNCRL AB 4-0 PS2 18 (SUTURE) ×4 IMPLANT
SUT PDS AB 1 CTX 36 (SUTURE) ×4 IMPLANT
SUT VIC AB 2-0 SH 27 (SUTURE) ×1
SUT VIC AB 2-0 SH 27X BRD (SUTURE) ×1 IMPLANT
SUT VICRYL 0 UR6 27IN ABS (SUTURE) ×2 IMPLANT
TAPE STRIPS DRAPE STRL (GAUZE/BANDAGES/DRESSINGS) IMPLANT
TOWEL OR 17X26 10 PK STRL BLUE (TOWEL DISPOSABLE) ×2 IMPLANT
TOWEL OR NON WOVEN STRL DISP B (DISPOSABLE) ×2 IMPLANT
TRAY FOLEY BAG SILVER LF 14FR (CATHETERS) IMPLANT
TRAY FOLEY BAG SILVER LF 16FR (CATHETERS) ×2 IMPLANT
TRAY FOLEY CATH 14FRSI W/METER (CATHETERS) IMPLANT
TRAY LAP CHOLE (CUSTOM PROCEDURE TRAY) ×2 IMPLANT
TROCAR BLADELESS OPT 5 100 (ENDOMECHANICALS) ×2 IMPLANT
TROCAR BLADELESS OPT 5 75 (ENDOMECHANICALS) ×2 IMPLANT
TROCAR XCEL 12X100 BLDLESS (ENDOMECHANICALS) ×2 IMPLANT
TROCAR XCEL BLUNT TIP 100MML (ENDOMECHANICALS) ×2 IMPLANT
TUBING INSUFFLATION 10FT LAP (TUBING) ×2 IMPLANT
YANKAUER SUCT BULB TIP 10FT TU (MISCELLANEOUS) IMPLANT

## 2012-06-29 NOTE — Anesthesia Postprocedure Evaluation (Signed)
  Anesthesia Post-op Note  Patient: Bobby Gonzalez  Procedure(s) Performed: Procedure(s) (LRB): LAPAROSCOPIC NEPHRECTOMY (Right)  Patient Location: PACU  Anesthesia Type: General  Level of Consciousness: awake and alert   Airway and Oxygen Therapy: Patient Spontanous Breathing  Post-op Pain: mild  Post-op Assessment: Post-op Vital signs reviewed, Patient's Cardiovascular Status Stable, Respiratory Function Stable, Patent Airway and No signs of Nausea or vomiting  Post-op Vital Signs: stable  Complications: No apparent anesthesia complications

## 2012-06-29 NOTE — Interval H&P Note (Signed)
History and Physical Interval Note:  06/29/2012 10:48 AM  Bobby Gonzalez  has presented today for surgery, with the diagnosis of RIGHT RENAL NEOPLASM  The various methods of treatment have been discussed with the patient and family. After consideration of risks, benefits and other options for treatment, the patient has consented to  Procedure(s) (LRB) with comments: LAPAROSCOPIC NEPHRECTOMY (Right) as a surgical intervention .  The patient's history has been reviewed, patient examined, no change in status, stable for surgery.  I have reviewed the patient's chart and labs.  Questions were answered to the patient's satisfaction.     Bobby Gonzalez,LES

## 2012-06-29 NOTE — Anesthesia Preprocedure Evaluation (Addendum)
Anesthesia Evaluation  Patient identified by MRN, date of birth, ID band Patient awake    Reviewed: Allergy & Precautions, H&P , NPO status , Patient's Chart, lab work & pertinent test results  Airway Mallampati: II TM Distance: >3 FB Neck ROM: full    Dental No notable dental hx. (+) Dental Advisory Given and Teeth Intact Some molars missing:   Pulmonary neg pulmonary ROS,  breath sounds clear to auscultation  Pulmonary exam normal       Cardiovascular Exercise Tolerance: Good hypertension, Rhythm:regular Rate:Normal     Neuro/Psych negative neurological ROS  negative psych ROS   GI/Hepatic Neg liver ROS, GERD-  ,Squamous cell tonsil.  Chemo and radiation.  Recent.   Endo/Other  negative endocrine ROS  Renal/GU negative Renal ROSRenal mass  negative genitourinary   Musculoskeletal   Abdominal   Peds  Hematology negative hematology ROS (+)   Anesthesia Other Findings   Reproductive/Obstetrics negative OB ROS                          Anesthesia Physical Anesthesia Plan  ASA: II  Anesthesia Plan: General   Post-op Pain Management:    Induction: Intravenous  Airway Management Planned: Oral ETT  Additional Equipment:   Intra-op Plan:   Post-operative Plan: Extubation in OR  Informed Consent: I have reviewed the patients History and Physical, chart, labs and discussed the procedure including the risks, benefits and alternatives for the proposed anesthesia with the patient or authorized representative who has indicated his/her understanding and acceptance.   Dental Advisory Given  Plan Discussed with: CRNA and Surgeon  Anesthesia Plan Comments:         Anesthesia Quick Evaluation

## 2012-06-29 NOTE — Transfer of Care (Signed)
Immediate Anesthesia Transfer of Care Note  Patient: Bobby Gonzalez  Procedure(s) Performed: Procedure(s) (LRB) with comments: LAPAROSCOPIC NEPHRECTOMY (Right)  Patient Location: PACU  Anesthesia Type:General  Level of Consciousness: awake, sedated and patient cooperative  Airway & Oxygen Therapy: Patient Spontanous Breathing and Patient connected to face mask oxygen  Post-op Assessment: Report given to PACU RN and Post -op Vital signs reviewed and stable  Post vital signs: Reviewed and stable  Complications: No apparent anesthesia complications

## 2012-06-29 NOTE — Progress Notes (Signed)
Patient ID: Bobby Gonzalez, male   DOB: 1965-10-20, 46 y.o.   MRN: [id number]  Post-op note  Subjective: The patient is doing well.  No complaints.  Objective: Vital signs in last 24 hours: Temp:  [97.5 F (36.4 C)-98.6 F (37 C)] 97.9 F (36.6 C) (10/28 1530) Pulse Rate:  [75-95] 76  (10/28 1530) Resp:  [9-18] 11  (10/28 1530) BP: (133-147)/(81-94) 143/88 mmHg (10/28 1530) SpO2:  [96 %-100 %] 99 % (10/28 1530)  Intake/Output from previous day:   Intake/Output this shift: Total I/O In: 2900 [I.V.:2900] Out: 400 [Urine:300; Blood:100]  Physical Exam:  General: Alert and oriented. Abdomen: Soft, Nondistended. Incisions: Clean and dry.  Lab Results:  Basename 06/29/12 1407  HGB 11.8*  HCT 32.6*    Assessment/Plan: POD#0   1) Continue to monitor 2) Ambulate and IS tonight   Bobby Gonzalez. MD   LOS: 0 days   Bobby Gonzalez,LES 06/29/2012, 5:34 PM

## 2012-06-29 NOTE — Op Note (Signed)
Preoperative diagnosis: Right renal neoplasm  Postoperative diagnosis: Right renal neoplasm  Procedure: 1.  Right laparoscopic radical nephrectomy  Surgeon: Moody Bruins. M.D.  Assistant(s): Pecola Leisure, PA-C  Anesthesia: General  Complications: None  EBL: 100 mL  IVF:  2700 mL crystalloid  Specimens: 1. Right kidney  Disposition of specimens: Pathology  Indication: Bobby Gonzalez is a 46 y.o. patient with a right renal tumor suspicious for malignancy.  After a thorough review of the management options for their renal mass, they elected to proceed with surgical treatment and the above procedure.  We have discussed the potential benefits and risks of the procedure, side effects of the proposed treatment, the likelihood of the patient achieving the goals of the procedure, and any potential problems that might occur during the procedure or recuperation. Informed consent has been obtained.  Description of procedure:  The patient was taken to the operating room and a general anesthetic was administered. The patient was given preoperative antibiotics, placed in the right modified flank position, and prepped and draped in the usual sterile fashion. Next a preoperative timeout was performed.  A site was selected near the umbilicus for placement of the camera port. This was placed using a standard open Hassan technique which allowed entry into the peritoneal cavity under direct vision and without difficulty. A 12 mm Hassan cannula was placed and a pneumoperitoneum established. The camera was then used to inspect the abdomen and there was no evidence of any intra-abdominal injuries or other abnormalities. The remaining abdominal ports were then placed. A 12 mm port was placed in the right lower quadrant and a 5 mm port was placed in the right upper quadrant.  All ports were placed under direct vision without difficulty.  Utilizing the harmonic scalpel, the white line of Toldt was  incised allowing the colon to be mobilized medially and the plane between the mesocolon and the anterior layer of Gerota's fascia to be developed and the kidney exposed.  The ureter and gonadal vein were identified inferiorly and the ureter was lifted anteriorly off the psoas muscle.  Dissection proceeded superiorly along the gonadal vein until the renal vein was identified.  The renal hilum was then carefully isolated with a combination of blunt and sharp dissectiong allowing the renal arterial and venous structures to be separated and isolated. There was a single renal artery and renal vein.  The renal artery was isolated and ligated with multiple Weck clips and subsequently divided.  The renal vein was then isolated and also ligated and divided with a 45 mm Flex ETS stapler.  Gerota's fascia was intentionally entered superiorly and the space between the adrenal gland and the kidney was developed allowing the adrenal gland to be spared.  The hepatorenal ligaments were divided with the harmonic scalpel.  The lateral and posterior attachements to the kidney were then divided.  The ureter was ligated with Weck clips and divided allowing the specimen to be freed from all surrounding structures.  The kidney specimen was then placed into a 15 mm Endocatch II retrieval bag.  The renal hilum, liver, adrenal bed and gonadal vein areas were each inspected and hemostasis was ensured with the pneomperitoneal pressures lowered.  The 12 mm lower quadrant port was then closed with a 0-vicryl suture placed laparoscopically to close the fascia of this incision. All remaining ports were removed under direct vision.  The kidney specimen was removed intact within the retrieval bag via the camera port site after this incision was  extended slightly. This fascial opening was then closed with two #1 PDS sutures.    All incisions were injected with local anesthetic and reapproximated at the skin with 4-0 monocryl sutures.   Dermabond was applied to the skin. The patient tolerated the procedure well and without complications and was transferred to the recovery unit in satisfactory condition.   Moody Bruins MD

## 2012-06-30 ENCOUNTER — Encounter (HOSPITAL_COMMUNITY): Payer: Self-pay | Admitting: Urology

## 2012-06-30 LAB — BASIC METABOLIC PANEL
BUN: 8 mg/dL (ref 6–23)
CO2: 29 mEq/L (ref 19–32)
Calcium: 8.5 mg/dL (ref 8.4–10.5)
Chloride: 100 mEq/L (ref 96–112)
Creatinine, Ser: 1.17 mg/dL (ref 0.50–1.35)
GFR calc Af Amer: 85 mL/min — ABNORMAL LOW (ref >90)
GFR calc non Af Amer: 74 mL/min — ABNORMAL LOW (ref >90)
Glucose, Bld: 112 mg/dL — ABNORMAL HIGH (ref 70–99)
Potassium: 3.7 mEq/L (ref 3.5–5.1)
Sodium: 135 mEq/L (ref 135–145)

## 2012-06-30 LAB — HEMOGLOBIN AND HEMATOCRIT, BLOOD
HCT: 33.6 % — ABNORMAL LOW (ref 39.0–52.0)
Hemoglobin: 11.8 g/dL — ABNORMAL LOW (ref 13.0–17.0)

## 2012-06-30 MED ORDER — DOCUSATE SODIUM 100 MG PO CAPS
100.0000 mg | ORAL_CAPSULE | Freq: Two times a day (BID) | ORAL | Status: DC
  Administered 2012-06-30 (×2): 100 mg via ORAL
  Filled 2012-06-30 (×4): qty 1

## 2012-06-30 MED ORDER — HYDROMORPHONE HCL PF 1 MG/ML IJ SOLN
1.0000 mg | Freq: Once | INTRAMUSCULAR | Status: AC
  Administered 2012-06-30: 1 mg via INTRAVENOUS

## 2012-06-30 MED ORDER — BISACODYL 10 MG RE SUPP
10.0000 mg | Freq: Once | RECTAL | Status: AC
  Administered 2012-06-30: 10 mg via RECTAL
  Filled 2012-06-30: qty 1

## 2012-06-30 MED ORDER — HYDROCODONE-ACETAMINOPHEN 5-325 MG PO TABS
1.0000 | ORAL_TABLET | Freq: Four times a day (QID) | ORAL | Status: DC | PRN
  Administered 2012-06-30 – 2012-07-01 (×2): 2 via ORAL
  Filled 2012-06-30 (×3): qty 2

## 2012-06-30 MED ORDER — HYDROMORPHONE HCL PF 1 MG/ML IJ SOLN
INTRAMUSCULAR | Status: AC
  Filled 2012-06-30: qty 1

## 2012-06-30 MED ORDER — SIMETHICONE 80 MG PO CHEW
80.0000 mg | CHEWABLE_TABLET | ORAL | Status: DC | PRN
  Administered 2012-06-30: 80 mg via ORAL
  Filled 2012-06-30: qty 1

## 2012-06-30 NOTE — Progress Notes (Signed)
Patient ID: Bobby Gonzalez, male   DOB: 1966-02-13, 46 y.o.   MRN: [id number]  1 Day Post-Op Subjective: The patient is doing well.  No nausea or vomiting. Pain is adequately controlled.  Objective: Vital signs in last 24 hours: Temp:  [97.5 F (36.4 C)-98.6 F (37 C)] 97.7 F (36.5 C) (10/29 0530) Pulse Rate:  [75-128] 77  (10/29 0530) Resp:  [9-18] 18  (10/29 0530) BP: (119-147)/(78-94) 120/78 mmHg (10/29 0530) SpO2:  [96 %-100 %] 99 % (10/29 0530) Weight:  [80.332 kg (177 lb 1.6 oz)] 80.332 kg (177 lb 1.6 oz) (10/28 1530)  Intake/Output from previous day: 10/28 0701 - 10/29 0700 In: 4850 [I.V.:4550; IV Piggyback:300] Out: 1200 [Urine:1100; Blood:100] Intake/Output this shift:    Physical Exam:  General: Alert and oriented. CV: RRR Lungs: Clear bilaterally. GI: Soft, Nondistended. Incisions: Clean and dry. Urine: Clear Extremities: Nontender, no erythema, no edema.  Lab Results:  Basename 06/30/12 0445 06/29/12 1407  HGB 11.8* 11.8*  HCT 33.6* 32.6*          Basename 06/30/12 0445 06/29/12 1407 06/25/12 1500  CREATININE 1.17 0.93 0.81           Results for orders placed during the hospital encounter of 06/29/12 (from the past 24 hour(s))  TYPE AND SCREEN     Status: Normal   Collection Time   06/29/12  9:55 AM      Component Value Range   ABO/RH(D) O POS     Antibody Screen NEG     Sample Expiration 07/02/2012    ABO/RH     Status: Normal   Collection Time   06/29/12 10:00 AM      Component Value Range   ABO/RH(D) O POS    BASIC METABOLIC PANEL     Status: Abnormal   Collection Time   06/29/12  2:07 PM      Component Value Range   Sodium 136  135 - 145 mEq/L   Potassium 3.7  3.5 - 5.1 mEq/L   Chloride 101  96 - 112 mEq/L   CO2 26  19 - 32 mEq/L   Glucose, Bld 117 (*) 70 - 99 mg/dL   BUN 10  6 - 23 mg/dL   Creatinine, Ser 4.69  0.50 - 1.35 mg/dL   Calcium 8.5  8.4 - 62.9 mg/dL   GFR calc non Af Amer >90  >90 mL/min   GFR calc Af Amer >90  >90  mL/min  HEMOGLOBIN AND HEMATOCRIT, BLOOD     Status: Abnormal   Collection Time   06/29/12  2:07 PM      Component Value Range   Hemoglobin 11.8 (*) 13.0 - 17.0 g/dL   HCT 52.8 (*) 41.3 - 24.4 %  BASIC METABOLIC PANEL     Status: Abnormal   Collection Time   06/30/12  4:45 AM      Component Value Range   Sodium 135  135 - 145 mEq/L   Potassium 3.7  3.5 - 5.1 mEq/L   Chloride 100  96 - 112 mEq/L   CO2 29  19 - 32 mEq/L   Glucose, Bld 112 (*) 70 - 99 mg/dL   BUN 8  6 - 23 mg/dL   Creatinine, Ser 0.10  0.50 - 1.35 mg/dL   Calcium 8.5  8.4 - 27.2 mg/dL   GFR calc non Af Amer 74 (*) >90 mL/min   GFR calc Af Amer 85 (*) >90 mL/min  HEMOGLOBIN AND HEMATOCRIT, BLOOD  Status: Abnormal   Collection Time   06/30/12  4:45 AM      Component Value Range   Hemoglobin 11.8 (*) 13.0 - 17.0 g/dL   HCT 81.1 (*) 91.4 - 78.2 %    Assessment/Plan: POD# 1 s/p laparoscopic nephrectomy.  1) Ambulate, Incentive spirometry 2) Advance diet as tolerated 3) Transition to oral pain medication 4) Dulcolax suppository 5) D/C urethral catheter   Moody Bruins. MD   LOS: 1 day   Johnika Escareno,LES 06/30/2012, 7:25 AM

## 2012-06-30 NOTE — Progress Notes (Signed)
Patient ID: Bobby Gonzalez, male   DOB: Jan 08, 1966, 46 y.o.   MRN: [id number]  Pt doing well.  Somewhat distented.  No flatus yet.  Pathology: pT1b Nx Mx, Fuhrman grade III, clear cell RCC with negative surgical margins.  Pt informed of pathology results and prognosis which is good.  He will receive simethicone and a suppository to help with gas.

## 2012-07-01 LAB — BASIC METABOLIC PANEL
BUN: 7 mg/dL (ref 6–23)
CO2: 28 mEq/L (ref 19–32)
Calcium: 8.7 mg/dL (ref 8.4–10.5)
Chloride: 99 mEq/L (ref 96–112)
Creatinine, Ser: 1.25 mg/dL (ref 0.50–1.35)
GFR calc Af Amer: 79 mL/min — ABNORMAL LOW (ref >90)
GFR calc non Af Amer: 68 mL/min — ABNORMAL LOW (ref >90)
Glucose, Bld: 117 mg/dL — ABNORMAL HIGH (ref 70–99)
Potassium: 3.8 mEq/L (ref 3.5–5.1)
Sodium: 133 mEq/L — ABNORMAL LOW (ref 135–145)

## 2012-07-01 MED ORDER — HYDROCODONE-ACETAMINOPHEN 5-325 MG PO TABS: 1.0000 | ORAL_TABLET | Freq: Four times a day (QID) | ORAL | Status: DC | PRN

## 2012-07-01 MED ORDER — DSS 100 MG PO CAPS: 100.0000 mg | ORAL_CAPSULE | Freq: Two times a day (BID) | ORAL | Status: DC

## 2012-07-01 NOTE — Discharge Summary (Signed)
Date of admission: 06/29/2012  Date of discharge: 07/01/2012  Admission diagnosis: Right renal mass  Discharge diagnosis: pT1b Nx Mx, Fuhrman grade III, clear cell RCC with negative surgical margins  Secondary diagnoses: heartburn, malignant neoplasm of tonsil  History and Physical: For full details, please see admission history and physical. Briefly, Bobby Gonzalez is a 46 y.o. 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 He has undergone treatment with chemotherapy (cisplatin-based) and radiation. During his evaluation for his tonsillar malignancy, he underwent a PET scan as part of his staging evaluation and was noted to have a mass of the right kidney which prompted a urologic evaluation and dedicated CT scan of the abdomen with and without contrast on 03/10/12. This revealed a large, centrally located 7.3 x 6.8 cm enhancing right renal neoplasm concerning for renal cell carcinoma. The mass was central and abuts the renal hilum. On delayed images, this mass was clearly displacing but not originating from the renal collecting system and it does have characteristics consistent with renal cell carcinoma. There is no regional lymphadenopathy, renal vein invasion, contralateral renal masses, adrenal masses, or other evidence for metastatic disease. He has been asymptomatic from this mass and denies flank pain or hematuria. He has a paternal family history of kidney cancer and his father had undergone nephrectomy for treatment about 10 years ago and has been NED since.  His baseline Cr is 0.88 indicating normal renal function. His case has been presented in the [facility] multidisciplinary conference and I have spoken with Dr. Gaylyn Rong. He has been receiving treatment for his SCCA into the early part of September and we discussed that it may be most appropriate to consider surgical treatment of his renal mass in late October or early November assuming he has recovered well  from his current treatment for SCCA.  Interval history:  He followed up in preparation for his upcoming right laparoscopic radical nephrectomy. He has recovered well following his systemic chemotherapy for his squamous cell carcinoma of the left tonsil. His last chemotherapy treatment was completed on 05/13/12. At this time, he is eating and not lying on his gastrostomy tube for nutrition. He has gained 12 pounds since completing chemotherapy. He continues to have a poor appetite but has forced himself to eat and is found foods that he can tolerate.   Hospital Course:  Pt was admitted and taken to the OR on 06/29/12 for a right laparoscopic radical nephrectomy.  He tolerated the procedure well and was hemodynamically stable immediately post op.  Pt was extubated without complication and woke up from anesthesia neurologically intact.  He was transferred to the PACU and then the floor without issue.  His post op course has progressed as expected.  His foley was removed and he has been able to void without difficulty.  He is ambulating and tolerating a regular diet.  He has not had a bowel movement yet but denies abdominal pain and bloating.  His vitals have remained stable.  Pt is doing well POD #2 and is felt ready for d/c home.    Laboratory values:  Basename 06/30/12 0445 06/29/12 1407  HGB 11.8* 11.8*  HCT 33.6* 32.6*   Lab Results  Component Value Date   CREATININE 1.25 07/01/2012    Disposition: Home  Discharge instruction: The patient was instructed to be ambulatory but to refrain from heavy lifting, strenuous activity, or driving.   Discharge medications:     Medication List  As of 07/01/2012  2:00 PM    START taking these medications         DSS 100 MG Caps   Take 100 mg by mouth 2 (two) times daily.      HYDROcodone-acetaminophen 5-325 MG per tablet   Commonly known as: NORCO/VICODIN   Take 1-2 tablets by mouth every 6 (six) hours as needed (pain).      STOP taking these  medications         multivitamin with minerals Tabs          Where to get your medications    These are the prescriptions that you need to pick up.   You may get these medications from any pharmacy.         DSS 100 MG Caps   HYDROcodone-acetaminophen 5-325 MG per tablet            Followup:  Follow-up Information    Follow up with BORDEN,LES, MD. On 07/21/2012. (at 10:15)    Contact information:   [address] [place] [address] [phone]

## 2012-07-01 NOTE — Care Management Note (Signed)
    Page 1 of 1   07/01/2012     3:38:33 PM   CARE MANAGEMENT NOTE 07/01/2012  Patient:  Bobby Gonzalez,Bobby Gonzalez   Account Number:  [id number]  Date Initiated:  07/01/2012  Documentation initiated by:  Lanier Clam  Subjective/Objective Assessment:   ADMITTED W/RENAL NEOPLASM     Action/Plan:   FROM HOME   Anticipated DC Date:  07/01/2012   Anticipated DC Plan:  HOME/SELF CARE         Choice offered to / List presented to:             Status of service:  Completed, signed off Medicare Important Message given?   (If response is "NO", the following Medicare IM given date fields will be blank) Date Medicare IM given:   Date Additional Medicare IM given:    Discharge Disposition:    Per UR Regulation:    If discussed at Long Length of Stay Meetings, dates discussed:    Comments:  07/01/12 [facility] Linet Brash RN,BSN [phone] [phone]

## 2012-07-01 NOTE — Progress Notes (Signed)
Patient ID: Bobby Gonzalez, male   DOB: [birth year], 46 y.o.   MRN: [id number]  2 Days Post-Op Subjective: Pt feeling better this morning.  Still with no flatus but not uncomfortable.  No nausea or vomiting.  Tolerating regular diet.  Objective: Vital signs in last 24 hours: Temp:  [98.4 F (36.9 C)-99.5 F (37.5 C)] 98.5 F (36.9 C) (10/30 0629) Pulse Rate:  [82-97] 95  (10/30 0629) Resp:  [16-18] 16  (10/30 0629) BP: (129-145)/(74-95) 140/95 mmHg (10/30 0629) SpO2:  [95 %-100 %] 99 % (10/30 0629)  Intake/Output from previous day: 10/29 0701 - 10/30 0700 In: 1757.5 [P.O.:840; I.V.:857.5] Out: 2850 [Urine:2850] Intake/Output this shift: Total I/O In: 60 [Other:60] Out: 1250 [Urine:1250]  Physical Exam:  General: Alert and oriented CV: RRR Lungs: Clear Abdomen: Soft, ND, Positive BS Incisions: C/D/I Ext: NT, No erythema  Lab Results:  Basename 06/30/12 0445 06/29/12 1407  HGB 11.8* 11.8*  HCT 33.6* 32.6*   BMET  Basename 07/01/12 0450 06/30/12 0445  NA 133* 135  K 3.8 3.7  CL 99 100  CO2 28 29  GLUCOSE 117* 112*  BUN 7 8  CREATININE 1.25 1.17  CALCIUM 8.7 8.5     Studies/Results: No results found.  Assessment/Plan: - SL IVF - If doing well later this morning, will D/C home   LOS: 2 days   Bobby Gonzalez,LES 07/01/2012, 6:43 AM

## 2012-07-07 ENCOUNTER — Ambulatory Visit: Payer: BC Managed Care – PPO | Attending: Oncology

## 2012-07-07 DIAGNOSIS — R1319 Other dysphagia: Secondary | ICD-10-CM | POA: Insufficient documentation

## 2012-07-07 DIAGNOSIS — IMO0001 Reserved for inherently not codable concepts without codable children: Secondary | ICD-10-CM | POA: Insufficient documentation

## 2012-07-08 ENCOUNTER — Other Ambulatory Visit: Payer: Self-pay | Admitting: Oncology

## 2012-07-22 NOTE — Progress Notes (Signed)
Received office notes from Dr. Lester Borden @ [facility]; forwarded to Dr. Ha. 

## 2012-08-20 ENCOUNTER — Ambulatory Visit
Admission: RE | Admit: 2012-08-20 | Discharge: 2012-08-20 | Disposition: A | Discharge status: Home / Self Care | Payer: BC Managed Care – PPO | Source: Ambulatory Visit | Attending: Radiation Oncology | Admitting: Radiation Oncology

## 2012-08-20 ENCOUNTER — Encounter: Payer: Self-pay | Admitting: Radiation Oncology

## 2012-08-20 VITALS — BP 113/74 | HR 71 | Temp 98.6°F | Resp 18 | Wt 161.2 lb

## 2012-08-20 DIAGNOSIS — Z9221 Personal history of antineoplastic chemotherapy: Secondary | ICD-10-CM | POA: Insufficient documentation

## 2012-08-20 DIAGNOSIS — C649 Malignant neoplasm of unspecified kidney, except renal pelvis: Secondary | ICD-10-CM | POA: Insufficient documentation

## 2012-08-20 DIAGNOSIS — C099 Malignant neoplasm of tonsil, unspecified: Secondary | ICD-10-CM

## 2012-08-20 DIAGNOSIS — Z905 Acquired absence of kidney: Secondary | ICD-10-CM | POA: Insufficient documentation

## 2012-08-20 DIAGNOSIS — Z923 Personal history of irradiation: Secondary | ICD-10-CM | POA: Insufficient documentation

## 2012-08-20 MED ORDER — LARYNGOSCOPY SOLUTION RAD-ONC
15.0000 mL | Freq: Once | TOPICAL | Status: AC
  Administered 2012-08-20: 15 mL via TOPICAL

## 2012-08-20 NOTE — Progress Notes (Signed)
  Radiation Oncology         [phone] ________________________________  Name: Bobby Gonzalez MRN: [id number]  Date: 08/20/2012  DOB: [birth year]  Follow-Up Visit Note  CC: [name], MD  Serena Colonel, MD  Diagnosis:   Squamous cell carcinoma of the tonsil  Interval Since Last Radiation:  3-1/2 months   Narrative:  The patient returns today for routine follow-up.  The patient has undergone interval nephrectomy. The surgery went well it appears and returned positive for a T1 B. renal cell carcinoma which was resected with negative margins. He has been recovering from this.  He has been experiencing an ongoing dry mouth, using by 18. His appetite and taste both remain poor. He denies any worsening dysphagia/odynophagia. The patient does complain of some pain in the upper back which he states has been present for 4-6 weeks. This has been stable. He feels that this may be related to increased activity. He has been more active, returning to work.                           ALLERGIES:  is allergic to banana and penicillins.  Meds: Current Outpatient Prescriptions  Medication Sig Dispense Refill  . docusate sodium 100 MG CAPS Take 100 mg by mouth 2 (two) times daily.  10 capsule  0  . HYDROcodone-acetaminophen (NORCO/VICODIN) 5-325 MG per tablet Take 1-2 tablets by mouth every 6 (six) hours as needed (pain).  30 tablet  0    Physical Findings: The patient is in no acute distress. Patient is alert and oriented.  weight is 161 lb 3.2 oz (73.12 kg). His oral temperature is 98.6 F (37 C). His blood pressure is 113/74 and his pulse is 71. His respiration is 18. .   Oral cavity: Clear, no lesions seen Neck: Skin has healed nicely, some possible fibrosis in the left upper neck but no discrete palpable lymphadenopathy on exam, much improved  Fiberoptic exam:  After the use of topical anesthetic, the flexible laryngoscope was passed through the right near. Good visualization was obtained.  No lesions or suspicious findings within the larynx, hypopharynx, oropharynx or nasopharynx.   Lab Findings: Lab Results  Component Value Date   WBC 5.1 06/25/2012   HGB 11.8* 06/30/2012   HCT 33.6* 06/30/2012   MCV 92.3 06/25/2012   PLT 210 06/25/2012     Radiographic Findings: No results found.  Impression:    The patient is a 46 year old male who is still recovering from chemoradiotherapy for his tonsillar cancer. He also had a nephrectomy and therefore really has gone through quite a bit in the last several months. I discussed with him my expectation for continued improvement, especially with taste and hopefully with some increasing moistness of his mouth. We discussed continuing to advance his diet and relying less on his feeding tube although this will take some time. He is scheduled for additional imaging next month.  Plan:  Followup in 4 months.  I spent 15 minutes with the patient today, the majority of which was spent counseling the patient on the diagnosis of cancer and coordinating care.   Radene Gunning, M.D., Ph.D.

## 2012-08-20 NOTE — Progress Notes (Signed)
Patient presents to the clinic today unaccompanied for follow up appointment with Dr. Mitzi Hansen. Patient alert and oriented to person, place, and time. No distress noted. Steady gait noted. Pleasant affect noted. Patient reports upper back pain 7 on a scale of 0-10 for which tylenol and a heating pad does nothing to relieve. Also, patient reports that if he raises his left arm up shoulder level he experiences radiating pain across his left upper chest. Patient reports neuropathy in finger tips and toes. Patient reports dry mouth despite using biotene. Patient reports that his taste has not returned. Ten pound weight loss noted since 06/25/2012 but, reports that he has had kidney surgery the end of October. Patient reports that most food taste like cardboard. Patient reports that he is unable to eat bread without gravy, etc. Patient reports that acidic foods burn his throat. Patient reports that occasionally food get caught in his throat when he swallows. Patient reports voice changes and hoarseness noted. Patient reports ringing in both ears. Patient denies nausea, vomiting, headache, or dizziness. Patient has returned to work six hours per day. Reported all findings to Dr. Mitzi Hansen.

## 2012-09-07 ENCOUNTER — Ambulatory Visit: Payer: BC Managed Care – PPO

## 2012-09-07 ENCOUNTER — Other Ambulatory Visit (HOSPITAL_BASED_OUTPATIENT_CLINIC_OR_DEPARTMENT_OTHER): Payer: BC Managed Care – PPO | Admitting: Lab

## 2012-09-07 ENCOUNTER — Ambulatory Visit: Payer: BC Managed Care – PPO | Attending: Oncology

## 2012-09-07 ENCOUNTER — Encounter (HOSPITAL_COMMUNITY): Payer: Self-pay

## 2012-09-07 ENCOUNTER — Encounter (HOSPITAL_COMMUNITY)
Admission: RE | Admit: 2012-09-07 | Discharge: 2012-09-07 | Disposition: A | Discharge status: Home / Self Care | Payer: BC Managed Care – PPO | Source: Ambulatory Visit | Attending: Oncology | Admitting: Oncology

## 2012-09-07 DIAGNOSIS — C099 Malignant neoplasm of tonsil, unspecified: Secondary | ICD-10-CM | POA: Insufficient documentation

## 2012-09-07 DIAGNOSIS — Z905 Acquired absence of kidney: Secondary | ICD-10-CM | POA: Insufficient documentation

## 2012-09-07 DIAGNOSIS — R1319 Other dysphagia: Secondary | ICD-10-CM | POA: Insufficient documentation

## 2012-09-07 DIAGNOSIS — IMO0001 Reserved for inherently not codable concepts without codable children: Secondary | ICD-10-CM | POA: Insufficient documentation

## 2012-09-07 LAB — CBC WITH DIFFERENTIAL/PLATELET
BASO%: 0.1 % (ref 0.0–2.0)
Basophils Absolute: 0 10*3/uL (ref 0.0–0.1)
EOS%: 1.6 % (ref 0.0–7.0)
Eosinophils Absolute: 0.1 10*3/uL (ref 0.0–0.5)
HCT: 42.5 % (ref 38.4–49.9)
HGB: 14.5 g/dL (ref 13.0–17.1)
LYMPH%: 6.6 % — ABNORMAL LOW (ref 14.0–49.0)
MCH: 32 pg (ref 27.2–33.4)
MCHC: 34 g/dL (ref 32.0–36.0)
MCV: 93.9 fL (ref 79.3–98.0)
MONO#: 0.4 10*3/uL (ref 0.1–0.9)
MONO%: 10 % (ref 0.0–14.0)
NEUT#: 3.5 10*3/uL (ref 1.5–6.5)
NEUT%: 81.7 % — ABNORMAL HIGH (ref 39.0–75.0)
Platelets: 231 10*3/uL (ref 140–400)
RBC: 4.53 10*6/uL (ref 4.20–5.82)
RDW: 12.6 % (ref 11.0–14.6)
WBC: 4.3 10*3/uL (ref 4.0–10.3)
lymph#: 0.3 10*3/uL — ABNORMAL LOW (ref 0.9–3.3)

## 2012-09-07 LAB — COMPREHENSIVE METABOLIC PANEL (CC13)
ALT: 8 U/L (ref 0–55)
AST: 11 U/L (ref 5–34)
Albumin: 3.9 g/dL (ref 3.5–5.0)
Alkaline Phosphatase: 60 U/L (ref 40–150)
BUN: 15 mg/dL (ref 7.0–26.0)
CO2: 27 mEq/L (ref 22–29)
Calcium: 9.7 mg/dL (ref 8.4–10.4)
Chloride: 104 mEq/L (ref 98–107)
Creatinine: 1.3 mg/dL (ref 0.7–1.3)
Glucose: 91 mg/dl (ref 70–99)
Potassium: 4.2 mEq/L (ref 3.5–5.1)
Sodium: 139 mEq/L (ref 136–145)
Total Bilirubin: 0.6 mg/dL (ref 0.20–1.20)
Total Protein: 7.1 g/dL (ref 6.4–8.3)

## 2012-09-07 LAB — GLUCOSE, CAPILLARY: Glucose-Capillary: 84 mg/dL (ref 70–99)

## 2012-09-07 LAB — TSH: TSH: 3.002 u[IU]/mL (ref 0.350–4.500)

## 2012-09-07 MED ORDER — FLUDEOXYGLUCOSE F - 18 (FDG) INJECTION
17.6000 | Freq: Once | INTRAVENOUS | Status: AC | PRN
  Administered 2012-09-07: 17.6 via INTRAVENOUS

## 2012-09-08 NOTE — Patient Instructions (Addendum)
1.  Diagnosis:  History of head and neck cancer and kidney cancer. 2.  Status:  In remission for both. 3.  Follow up:  In about 8 months.  4.  Pain:  Tylenol prn.  Avoid NSAIDS (Ibuprofen, Motrin, Advils) as these can worsen kidney function.

## 2012-09-09 ENCOUNTER — Encounter: Payer: Self-pay | Admitting: Oncology

## 2012-09-09 ENCOUNTER — Ambulatory Visit (HOSPITAL_BASED_OUTPATIENT_CLINIC_OR_DEPARTMENT_OTHER): Payer: BC Managed Care – PPO | Admitting: Oncology

## 2012-09-09 ENCOUNTER — Telehealth: Payer: Self-pay | Admitting: Oncology

## 2012-09-09 VITALS — BP 127/82 | HR 85 | Temp 97.6°F | Resp 18 | Ht 70.0 in | Wt 159.8 lb

## 2012-09-09 DIAGNOSIS — E46 Unspecified protein-calorie malnutrition: Secondary | ICD-10-CM

## 2012-09-09 DIAGNOSIS — C099 Malignant neoplasm of tonsil, unspecified: Secondary | ICD-10-CM

## 2012-09-09 DIAGNOSIS — K117 Disturbances of salivary secretion: Secondary | ICD-10-CM

## 2012-09-09 DIAGNOSIS — C649 Malignant neoplasm of unspecified kidney, except renal pelvis: Secondary | ICD-10-CM

## 2012-09-09 HISTORY — DX: Malignant neoplasm of unspecified kidney, except renal pelvis: C64.9

## 2012-09-09 MED ORDER — PILOCARPINE HCL 5 MG PO TABS: 5.0000 mg | ORAL_TABLET | Freq: Three times a day (TID) | ORAL | Status: DC

## 2012-09-09 NOTE — Progress Notes (Signed)
[facility]  [phone] [phone]   OFFICE PROGRESS NOTE   Cc:  [name], MD  DIAGNOSIS:  1. 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 Right renal mass with positive PET scan; pending evaluation and workup.  2. Right renal cell carcinoma, clear cell subtype; grade III/IV; pT1b.    PAST THERAPY:  1. Due to start concurrent chemo radiation today 03/30/2012 with daily XRT and weekly Cisplatin.  2. Right nephrectomy on 06/29/2012.   CURRENT THERAPY:  Watchful observation.   INTERVAL HISTORY: Bobby Gonzalez 47 y.o. male returns for regular followup visit with his wife and daughter.  He reported much better strength now than before.  He has returned to work part time.  He has xerostomia and has some problem eating dry/acid foods.  He has not used PEG tube much except for some time extra fluids.  He feels that his chin is thick but there is not discrete cervical neck mass or pain.  He has had some back pain in the lower back.  He has been taking some Tylenol occasionally. He has been avoiding NSAIDS as instructed.  He denied lower extremity weakness, bowel/bladder incontinence or paresthesia.  He denied hematuria.   The rest of the 14-point review of system was negative.    Past Medical History  Diagnosis Date  . GERD (gastroesophageal reflux disease)   . Allergy     pcns  . Diverticula of colon 03/10/12    multiple rectosigmoid colonic diverticula/ ct abd/pelvis  . Chronic kidney disease     right renal mass 7.3x6.7x6.8cm renal cell ca  . Renal mass 03/30/2012  . Dehydration 05/13/2012  . Phlegm in throat 05/13/2012  . History of radiation therapy 03/30/12-05/15/12    left tonsil  . HTN (hypertension) 06-25-12    at first start of chemo for squamous cell cancer of neck-left  . 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  . Cancer 02/17/12 bx    left tonsil squamous cell ca,HPV  positive, ,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 radiation and chemo 05-13-12(Dr. Gaylyn Rong)  . Renal cell carcinoma 09/09/2012    Past Surgical History  Procedure Date  . Wisdom tooth extraction   . Direct laryngoscopy     S/P DL, Biopsy-Dr. Pollyann Kennedy. Pathology postive for SCCa of Left Tonsil  . Gastrostomy tube placement 03/02/12  . Multiple tooth extractions 03/04/12    with DR. Mohorn  . Peg tube placement 06-25-12    remains in place abdomen-not using at present.  . Tonsil biopsy 02/17/12    SCCa left tomnsil,HPV positive,spread to l cervical node  . Laparoscopic nephrectomy 06/29/2012    Procedure: LAPAROSCOPIC NEPHRECTOMY;  Surgeon: Crecencio Mc, MD;  Location: [facility];  Service: Urology;  Laterality: Right;    Current Outpatient Prescriptions  Medication Sig Dispense Refill  . docusate sodium 100 MG CAPS Take 100 mg by mouth 2 (two) times daily.  10 capsule  0  . HYDROcodone-acetaminophen (NORCO/VICODIN) 5-325 MG per tablet Take 1-2 tablets by mouth every 6 (six) hours as needed (pain).  30 tablet  0  . pilocarpine (SALAGEN) 5 MG tablet Take 1 tablet (5 mg total) by mouth 3 (three) times daily.  90 tablet  5    ALLERGIES:  is allergic to banana and penicillins.  REVIEW OF SYSTEMS:  The rest of the 14-point review of system was negative.   Filed Vitals:   09/09/12 1511  BP: 127/82  Pulse: 85  Temp: 97.6 F (36.4 C)  Resp: 18   Wt Readings from Last 3 Encounters:  09/09/12 159 lb 12.8 oz (72.485 kg)  08/20/12 161 lb 3.2 oz (73.12 kg)  06/29/12 177 lb 1.6 oz (80.332 kg)   ECOG Performance status: 1  PHYSICAL EXAMINATION:  General: thin-appearing man, in no acute distress. Eyes: no scleral icterus. ENT: There were no oropharyngeal lesions.  There was submental edema without discrete mass.  Neck was without thyromegaly. Lymphatics: Positive for a 1 cm left level II/III cercical node. There was no supraclavicular or axillary adenopathy. Respiratory: lungs were clear bilaterally without  wheezing or crackles. Cardiovascular: Regular rate and rhythm, S1/S2, without murmur, rub or gallop. There was no pedal edema. GI: abdomen was soft, flat, nontender, nondistended, without organomegaly.  PEG tube was dry, clean, intact. Muscoloskeletal: no spinal tenderness of palpation of vertebral spine. Skin exam was without echymosis, petichae. Neuro exam was nonfocal. Patient was able to get on and off exam table without assistance. Gait was normal. Patient was alerted and oriented. Attention was good. Language was appropriate. Mood was normal without depression. Speech was not pressured. Thought content was not tangential.     LABORATORY/RADIOLOGY DATA:  Lab Results  Component Value Date   WBC 4.3 09/07/2012   HGB 14.5 09/07/2012   HCT 42.5 09/07/2012   PLT 231 09/07/2012   GLUCOSE 91 09/07/2012   ALKPHOS 60 09/07/2012   ALT 8 09/07/2012   AST 11 09/07/2012   NA 139 09/07/2012   K 4.2 09/07/2012   CL 104 09/07/2012   CREATININE 1.3 09/07/2012   BUN 15.0 09/07/2012   CO2 27 09/07/2012   INR 0.94 03/02/2012    IMAGING:  I personally reviewed the PET scan performed on 09/07/2012 and showed the images to the patient and his relatives.   Neck: Mild asymmetric right parotid hypermetabolism has an S U V  max of 4.7. No additional areas of abnormal hypermetabolism in the  neck. Mild asymmetric paraspinal musculature hypermetabolism is  likely physiologic. CT images show no acute findings.  Chest: No hypermetabolic mediastinal hilar lymph nodes. CT  findings show no worrisome pulmonary nodules, pericardial effusion  or pleural effusion.  Abdomen/Pelvis: No abnormal hypermetabolic activity within the  liver, pancreas, adrenal glands, or spleen. No hypermetabolic  lymph nodes in the abdomen or pelvis. CT images show no acute  findings. Percutaneous gastrostomy is in place. Postoperative  changes of right nephrectomy. No free fluid.  Skeleton: No focal hypermetabolic activity to suggest skeletal  metastasis.    IMPRESSION:  1. No evidence of residual, recurrent or metastatic disease.  2. Asymmetric right parotid activity is likely infectious or  inflammatory in etiology.    ASSESSMENT AND PLAN:   1. Right RCC:  S/p nephrectomy, on observation.  I defer to Dr. Laverle Patter as to his protocol for surveillance CT scan.  2. Left tonsil squamous cell carcinoma:  - He is s/p chemoradiation with complete response.  The uptake in the right parotid is most likely inflammatory. - I advised him to follow up with Rad Onc, and ENT between our visits.  I advised him to avoid smoking cigarettes, chewing tobacco, or drinking EtOH more than a glass a week to minimize the risk of recurrence. .  - Given his nephrectomy and borderline Cr of 1.3, I recommended clinical surveillance with flexible laryngoscopy with Rad Onc and ENT and no routine CT unless he has concerning findings.   3.  Xerostomia:  I prescribed Salagen.    4. Moderate Calorie/protein:  He is 95% on oral intake now.  His weight is lower now compared to before right nephrectomy.  However, it is stable.  He requested to have PEG tube removal; I referred him to IR.   5.  Submental lymphedema:  I referred him to lymphedema clinic.   6. Follow up: In about 8 months with [place].      The length of time of the face-to-face encounter was 25 minutes. More than 50% of time was spent counseling and coordination of care.

## 2012-09-09 NOTE — Telephone Encounter (Signed)
appts made and printed for pt  °

## 2012-09-23 ENCOUNTER — Ambulatory Visit: Payer: BC Managed Care – PPO | Admitting: Physical Therapy

## 2012-09-24 ENCOUNTER — Ambulatory Visit: Payer: BC Managed Care – PPO | Admitting: *Deleted

## 2012-09-29 ENCOUNTER — Ambulatory Visit: Payer: BC Managed Care – PPO | Admitting: Physical Therapy

## 2012-10-02 ENCOUNTER — Ambulatory Visit: Payer: BC Managed Care – PPO | Admitting: Physical Therapy

## 2012-10-03 HISTORY — PX: PEG TUBE REMOVAL: SHX2187

## 2012-10-06 ENCOUNTER — Ambulatory Visit: Payer: BC Managed Care – PPO | Attending: Oncology

## 2012-10-06 DIAGNOSIS — R1319 Other dysphagia: Secondary | ICD-10-CM | POA: Insufficient documentation

## 2012-10-06 DIAGNOSIS — IMO0001 Reserved for inherently not codable concepts without codable children: Secondary | ICD-10-CM | POA: Insufficient documentation

## 2012-10-08 ENCOUNTER — Ambulatory Visit: Payer: BC Managed Care – PPO | Admitting: Physical Therapy

## 2012-10-13 ENCOUNTER — Ambulatory Visit: Payer: BC Managed Care – PPO | Admitting: Physical Therapy

## 2012-10-15 ENCOUNTER — Ambulatory Visit: Payer: BC Managed Care – PPO

## 2012-10-19 ENCOUNTER — Telehealth: Payer: Self-pay | Admitting: *Deleted

## 2012-10-19 DIAGNOSIS — C099 Malignant neoplasm of tonsil, unspecified: Secondary | ICD-10-CM

## 2012-10-19 NOTE — Telephone Encounter (Signed)
Call from wife states pt has swelling in groin,  Not painful. Wonders if it is a hernia.  Has appt w/ PCP this afteroon [facility].   Pt would also like to have PEG tube removed.  Wife states pt has not used in months and is eating well, gaining a few pounds,  Has not lost any weight.  Per Dr. Gaylyn Rong ok to have swelling in groin assessed by PCP and ok to have PEG removed.  Informed wife and that order for PEG removal placed.  She verbalized understanding.

## 2012-10-23 ENCOUNTER — Encounter (INDEPENDENT_AMBULATORY_CARE_PROVIDER_SITE_OTHER): Payer: Self-pay | Admitting: Surgery

## 2012-10-23 ENCOUNTER — Ambulatory Visit (INDEPENDENT_AMBULATORY_CARE_PROVIDER_SITE_OTHER): Payer: BC Managed Care – PPO | Admitting: Surgery

## 2012-10-23 VITALS — BP 120/70 | HR 68 | Temp 97.5°F | Resp 16 | Ht 70.0 in | Wt 161.0 lb

## 2012-10-23 DIAGNOSIS — K4090 Unilateral inguinal hernia, without obstruction or gangrene, not specified as recurrent: Secondary | ICD-10-CM

## 2012-10-23 HISTORY — DX: Unilateral inguinal hernia, without obstruction or gangrene, not specified as recurrent: K40.90

## 2012-10-23 MED ORDER — DEXTROSE 5 % IV SOLN: 300.0000 mg | Freq: Once | INTRAVENOUS | Status: DC

## 2012-10-23 NOTE — Progress Notes (Signed)
Patient ID: Bobby Gonzalez, male   DOB: 04/19/1966, 47 y.o.   MRN: [id number]  Chief Complaint  Patient presents with  . New Evaluation    eval hernia    HPI Bobby Gonzalez is a 47 y.o. male.  Patient sent at request of Dr. Hairiness for left inguinal hernia. Patient noted one week ago bulge in left groin which is more painful after prolonged standing. He gets smaller when he is recumbent. The pain is mild to moderate in intensity and slowly getting worse especially with standing. Recent history of renal cell carcinoma. Patient has a gastrostomy tube currently but he is no longer in use. HPI  Past Medical History  Diagnosis Date  . GERD (gastroesophageal reflux disease)   . Allergy     pcns  . Diverticula of colon 03/10/12    multiple rectosigmoid colonic diverticula/ ct abd/pelvis  . Chronic kidney disease     right renal mass 7.3x6.7x6.8cm renal cell ca  . Renal mass 03/30/2012  . Dehydration 05/13/2012  . Phlegm in throat 05/13/2012  . History of radiation therapy 03/30/12-05/15/12    left tonsil  . HTN (hypertension) 06-25-12    at first start of chemo for squamous cell cancer of neck-left  . 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  . 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 radiation and chemo 05-13-12(Dr. Ha)  . Renal cell carcinoma 09/09/2012    Past Surgical History  Procedure Laterality Date  . Wisdom tooth extraction    . Direct laryngoscopy      S/P DL, Biopsy-Dr. Rosen. Pathology postive for SCCa of Left Tonsil  . Gastrostomy tube placement  03/02/12  . Multiple tooth extractions  03/04/12    with DR. Mohorn  . Peg tube placement  06-25-12    remains in place abdomen-not using at present.  . Tonsil biopsy  02/17/12    SCCa left tomnsil,HPV positive,spread to l cervical node  . Laparoscopic nephrectomy  06/29/2012    Procedure: LAPAROSCOPIC NEPHRECTOMY;  Surgeon: Les Borden, MD;   Location: [facility];  Service: Urology;  Laterality: Right;    Family History  Problem Relation Age of Onset  . Stroke Father   . Cancer Father     Prostate    Social History History  Substance Use Topics  . Smoking status: Never Smoker   . Smokeless tobacco: Never Used  . Alcohol Use: No     Comment: Never drank alocohol.    Allergies  Allergen Reactions  . Banana Other (See Comments)    Unknown   . Penicillins     unknown    Current Outpatient Prescriptions  Medication Sig Dispense Refill  . docusate sodium 100 MG CAPS Take 100 mg by mouth 2 (two) times daily.  10 capsule  0  . HYDROcodone-acetaminophen (NORCO/VICODIN) 5-325 MG per tablet Take 1-2 tablets by mouth every 6 (six) hours as needed (pain).  30 tablet  0  . pilocarpine (SALAGEN) 5 MG tablet Take 1 tablet (5 mg total) by mouth 3 (three) times daily.  90 tablet  5   Current Facility-Administered Medications  Medication Dose Route Frequency Provider Last Rate Last Dose  . clindamycin (CLEOCIN) 300 mg in dextrose 5 % 50 mL IVPB  300 mg Intravenous Once Ilijah Doucet A. Natha Guin, MD        Review of Systems Review of Systems  Constitutional: Positive for fatigue.    HENT: Negative.   Eyes: Negative.   Respiratory: Negative.   Cardiovascular: Negative.   Gastrointestinal: Negative.   Endocrine: Negative.   Genitourinary: Negative.   Allergic/Immunologic: Negative.   Neurological: Negative.   Hematological: Negative.   Psychiatric/Behavioral: Negative.     Blood pressure 120/70, pulse 68, temperature 97.5 F (36.4 C), resp. rate 16, height 5' 10" (1.778 m), weight 161 lb (73.029 kg).  Physical Exam Physical Exam  Constitutional: He is oriented to person, place, and time. He appears well-developed and well-nourished.  HENT:  Head: Normocephalic and atraumatic.  Eyes: EOM are normal. Pupils are equal, round, and reactive to light.  Neck: Normal range of motion. Neck supple.  Cardiovascular: Normal rate and  regular rhythm.   Pulmonary/Chest: Effort normal and breath sounds normal.  Abdominal: A hernia is present. Hernia confirmed positive in the left inguinal area.    Musculoskeletal: Normal range of motion.  Neurological: He is alert and oriented to person, place, and time.  Skin: Skin is warm and dry.  Psychiatric: He has a normal mood and affect. His behavior is normal. Thought content normal.      Assessment    LIH    Plan    Repair LIH.  Pt asked about GT removal AND THIS CAN BE DONE if OK with oncology.The risk of hernia repair include bleeding,  Infection,   Recurrence of the hernia,  Mesh use, chronic pain,  Organ injury,  Bowel injury,  Bladder injury,   nerve injury with numbness around the incision,  Death,  and worsening of preexisting  medical problems.  The alternatives to surgery have been discussed as well..  Long term expectations of both operative and non operative treatments have been discussed.   The patient agrees to proceed.       Doratha Mcswain A. 10/23/2012, 10:07 AM    

## 2012-10-23 NOTE — Patient Instructions (Signed)

## 2012-10-27 ENCOUNTER — Encounter (HOSPITAL_COMMUNITY): Payer: Self-pay | Admitting: Pharmacy Technician

## 2012-10-28 ENCOUNTER — Other Ambulatory Visit: Payer: Self-pay | Admitting: Radiology

## 2012-10-30 ENCOUNTER — Encounter (HOSPITAL_COMMUNITY)
Admission: RE | Admit: 2012-10-30 | Discharge: 2012-10-30 | Disposition: A | Discharge status: Home / Self Care | Payer: BC Managed Care – PPO | Source: Ambulatory Visit | Attending: Oncology | Admitting: Oncology

## 2012-10-30 ENCOUNTER — Ambulatory Visit (HOSPITAL_COMMUNITY)
Admission: RE | Admit: 2012-10-30 | Discharge: 2012-10-30 | Disposition: A | Discharge status: Home / Self Care | Payer: BC Managed Care – PPO | Source: Ambulatory Visit | Attending: Oncology | Admitting: Oncology

## 2012-10-30 ENCOUNTER — Encounter (HOSPITAL_COMMUNITY): Payer: Self-pay

## 2012-10-30 VITALS — BP 110/82 | HR 67 | Temp 97.7°F

## 2012-10-30 DIAGNOSIS — C099 Malignant neoplasm of tonsil, unspecified: Secondary | ICD-10-CM

## 2012-10-30 DIAGNOSIS — Z431 Encounter for attention to gastrostomy: Secondary | ICD-10-CM | POA: Insufficient documentation

## 2012-10-30 HISTORY — DX: Pain, unspecified: R52

## 2012-10-30 HISTORY — DX: Failed or difficult intubation, initial encounter: T88.4XXA

## 2012-10-30 LAB — CBC
HCT: 43.4 % (ref 39.0–52.0)
Hemoglobin: 15 g/dL (ref 13.0–17.0)
MCH: 30.9 pg (ref 26.0–34.0)
MCHC: 34.6 g/dL (ref 30.0–36.0)
MCV: 89.5 fL (ref 78.0–100.0)
Platelets: 207 10*3/uL (ref 150–400)
RBC: 4.85 MIL/uL (ref 4.22–5.81)
RDW: 12.1 % (ref 11.5–15.5)
WBC: 4 10*3/uL (ref 4.0–10.5)

## 2012-10-30 LAB — BASIC METABOLIC PANEL
BUN: 15 mg/dL (ref 6–23)
CO2: 28 mEq/L (ref 19–32)
Calcium: 9.5 mg/dL (ref 8.4–10.5)
Chloride: 103 mEq/L (ref 96–112)
Creatinine, Ser: 1.32 mg/dL (ref 0.50–1.35)
GFR calc Af Amer: 73 mL/min — ABNORMAL LOW (ref >90)
GFR calc non Af Amer: 63 mL/min — ABNORMAL LOW (ref >90)
Glucose, Bld: 89 mg/dL (ref 70–99)
Potassium: 4.5 mEq/L (ref 3.5–5.1)
Sodium: 139 mEq/L (ref 135–145)

## 2012-10-30 LAB — SURGICAL PCR SCREEN
MRSA, PCR: NEGATIVE
Staphylococcus aureus: NEGATIVE

## 2012-10-30 MED ORDER — DIAZEPAM 5 MG PO TABS
10.0000 mg | ORAL_TABLET | Freq: Once | ORAL | Status: AC
  Administered 2012-10-30: 10 mg via ORAL
  Filled 2012-10-30: qty 2

## 2012-10-30 MED ORDER — LIDOCAINE VISCOUS 2 % MT SOLN
5.0000 mL | Freq: Once | OROMUCOSAL | Status: AC
  Administered 2012-10-30: 5 mL via OROMUCOSAL
  Filled 2012-10-30: qty 5

## 2012-10-30 NOTE — Patient Instructions (Signed)
YOUR SURGERY IS SCHEDULED AT [facility]  ON:   WED  3/5  REPORT TO [facility] AT:  8:15 AM      PHONE # FOR SHORT STAY IS [phone]  DO NOT EAT OR DRINK ANYTHING AFTER MIDNIGHT THE NIGHT BEFORE YOUR SURGERY.  YOU MAY BRUSH YOUR TEETH, RINSE OUT YOUR MOUTH--BUT NO WATER, NO FOOD, NO CHEWING GUM, NO MINTS, NO CANDIES, NO CHEWING TOBACCO.  PLEASE TAKE THE FOLLOWING MEDICATIONS THE AM OF YOUR SURGERY WITH A FEW SIPS OF WATER: PEPCID    DO NOT BRING VALUABLES, MONEY, CREDIT CARDS.  DO NOT WEAR JEWELRY, MAKE-UP, NAIL POLISH AND NO METAL PINS OR CLIPS IN YOUR HAIR. CONTACT LENS, DENTURES / PARTIALS, GLASSES SHOULD NOT BE WORN TO SURGERY AND IN MOST CASES-HEARING AIDS WILL NEED TO BE REMOVED.  BRING YOUR GLASSES CASE, ANY EQUIPMENT NEEDED FOR YOUR CONTACT LENS. FOR PATIENTS ADMITTED TO THE HOSPITAL--CHECK OUT TIME THE DAY OF DISCHARGE IS 11:00 AM.  ALL INPATIENT ROOMS ARE PRIVATE - WITH BATHROOM, TELEPHONE, TELEVISION AND WIFI INTERNET.  IF YOU ARE BEING DISCHARGED THE SAME DAY OF YOUR SURGERY--YOU CAN NOT DRIVE YOURSELF HOME--AND SHOULD NOT GO HOME ALONE BY TAXI OR BUS.  NO DRIVING OR OPERATING MACHINERY FOR 24 HOURS FOLLOWING ANESTHESIA / PAIN MEDICATIONS.  PLEASE MAKE ARRANGEMENTS FOR SOMEONE TO BE WITH YOU AT HOME THE FIRST 24 HOURS AFTER SURGERY. RESPONSIBLE DRIVER'S NAME  WIFE- APRIL                                               PHONE #   [phone]                           PLEASE READ OVER ANY  FACT SHEETS THAT YOU WERE GIVEN: MRSA INFORMATION FAILURE TO FOLLOW THESE INSTRUCTIONS MAY RESULT IN THE CANCELLATION OF YOUR SURGERY.   PATIENT SIGNATURE_________________________________

## 2012-10-30 NOTE — Pre-Procedure Instructions (Signed)
EKG AND CXR REPORT IN EPIC FROM 06/25/12

## 2012-10-30 NOTE — Procedures (Signed)
Percutaneous gastrostomy tube removal was performed without immediate complications. Gauze dressing was applied to site afterwards. Medications utilized- 2% viscous lidocaine into tube insertion site, valium 10 mg po prior to removal.

## 2012-11-04 ENCOUNTER — Ambulatory Visit (HOSPITAL_COMMUNITY)
Admission: RE | Admit: 2012-11-04 | Discharge: 2012-11-04 | Disposition: A | Discharge status: Home / Self Care | Payer: BC Managed Care – PPO | Source: Ambulatory Visit | Attending: Surgery | Admitting: Surgery

## 2012-11-04 ENCOUNTER — Ambulatory Visit (HOSPITAL_COMMUNITY): Payer: BC Managed Care – PPO | Admitting: Anesthesiology

## 2012-11-04 ENCOUNTER — Encounter (HOSPITAL_COMMUNITY)
Admission: RE | Disposition: A | Discharge status: Home / Self Care | Payer: Self-pay | Source: Ambulatory Visit | Attending: Surgery

## 2012-11-04 ENCOUNTER — Encounter (HOSPITAL_COMMUNITY): Payer: Self-pay | Admitting: *Deleted

## 2012-11-04 ENCOUNTER — Encounter (HOSPITAL_COMMUNITY): Payer: Self-pay | Admitting: Anesthesiology

## 2012-11-04 DIAGNOSIS — I10 Essential (primary) hypertension: Secondary | ICD-10-CM | POA: Insufficient documentation

## 2012-11-04 DIAGNOSIS — E860 Dehydration: Secondary | ICD-10-CM

## 2012-11-04 DIAGNOSIS — N189 Chronic kidney disease, unspecified: Secondary | ICD-10-CM

## 2012-11-04 DIAGNOSIS — C649 Malignant neoplasm of unspecified kidney, except renal pelvis: Secondary | ICD-10-CM | POA: Insufficient documentation

## 2012-11-04 DIAGNOSIS — K4090 Unilateral inguinal hernia, without obstruction or gangrene, not specified as recurrent: Secondary | ICD-10-CM | POA: Insufficient documentation

## 2012-11-04 DIAGNOSIS — C801 Malignant (primary) neoplasm, unspecified: Secondary | ICD-10-CM

## 2012-11-04 DIAGNOSIS — N2889 Other specified disorders of kidney and ureter: Secondary | ICD-10-CM

## 2012-11-04 DIAGNOSIS — T7840XA Allergy, unspecified, initial encounter: Secondary | ICD-10-CM

## 2012-11-04 DIAGNOSIS — R0989 Other specified symptoms and signs involving the circulatory and respiratory systems: Secondary | ICD-10-CM

## 2012-11-04 DIAGNOSIS — K219 Gastro-esophageal reflux disease without esophagitis: Secondary | ICD-10-CM | POA: Insufficient documentation

## 2012-11-04 DIAGNOSIS — Z79899 Other long term (current) drug therapy: Secondary | ICD-10-CM | POA: Insufficient documentation

## 2012-11-04 DIAGNOSIS — C099 Malignant neoplasm of tonsil, unspecified: Secondary | ICD-10-CM

## 2012-11-04 DIAGNOSIS — R5381 Other malaise: Secondary | ICD-10-CM | POA: Insufficient documentation

## 2012-11-04 HISTORY — PX: INSERTION OF MESH: SHX5868

## 2012-11-04 HISTORY — PX: INGUINAL HERNIA REPAIR: SHX194

## 2012-11-04 SURGERY — REPAIR, HERNIA, INGUINAL, ADULT
Anesthesia: General | Site: Groin | Laterality: Left | Wound class: Clean

## 2012-11-04 MED ORDER — VANCOMYCIN HCL IN DEXTROSE 1-5 GM/200ML-% IV SOLN
INTRAVENOUS | Status: AC
  Filled 2012-11-04: qty 200

## 2012-11-04 MED ORDER — DEXAMETHASONE SODIUM PHOSPHATE 10 MG/ML IJ SOLN
INTRAMUSCULAR | Status: DC | PRN
  Administered 2012-11-04: 10 mg via INTRAVENOUS

## 2012-11-04 MED ORDER — FENTANYL CITRATE 0.05 MG/ML IJ SOLN
25.0000 ug | INTRAMUSCULAR | Status: DC | PRN
  Administered 2012-11-04: 50 ug via INTRAVENOUS

## 2012-11-04 MED ORDER — PROMETHAZINE HCL 25 MG/ML IJ SOLN: 6.2500 mg | INTRAMUSCULAR | Status: DC | PRN

## 2012-11-04 MED ORDER — BUPIVACAINE-EPINEPHRINE 0.25% -1:200000 IJ SOLN
INTRAMUSCULAR | Status: DC | PRN
  Administered 2012-11-04: 20 mL

## 2012-11-04 MED ORDER — TRAMADOL HCL 50 MG PO TABS: 50.0000 mg | ORAL_TABLET | Freq: Four times a day (QID) | ORAL | Status: DC | PRN

## 2012-11-04 MED ORDER — OXYCODONE-ACETAMINOPHEN 5-325 MG PO TABS: 1.0000 | ORAL_TABLET | ORAL | Status: DC | PRN

## 2012-11-04 MED ORDER — FENTANYL CITRATE 0.05 MG/ML IJ SOLN
INTRAMUSCULAR | Status: AC
  Filled 2012-11-04: qty 2

## 2012-11-04 MED ORDER — BUPIVACAINE-EPINEPHRINE 0.25% -1:200000 IJ SOLN
INTRAMUSCULAR | Status: AC
  Filled 2012-11-04: qty 1

## 2012-11-04 MED ORDER — ACETAMINOPHEN 10 MG/ML IV SOLN
INTRAVENOUS | Status: AC
  Filled 2012-11-04: qty 100

## 2012-11-04 MED ORDER — ACETAMINOPHEN 10 MG/ML IV SOLN
INTRAVENOUS | Status: DC | PRN
  Administered 2012-11-04: 1000 mg via INTRAVENOUS

## 2012-11-04 MED ORDER — MIDAZOLAM HCL 5 MG/5ML IJ SOLN
INTRAMUSCULAR | Status: DC | PRN
  Administered 2012-11-04: 2 mg via INTRAVENOUS

## 2012-11-04 MED ORDER — FENTANYL CITRATE 0.05 MG/ML IJ SOLN
INTRAMUSCULAR | Status: DC | PRN
  Administered 2012-11-04: 50 ug via INTRAVENOUS

## 2012-11-04 MED ORDER — 0.9 % SODIUM CHLORIDE (POUR BTL) OPTIME
TOPICAL | Status: DC | PRN
  Administered 2012-11-04: 1000 mL

## 2012-11-04 MED ORDER — LACTATED RINGERS IV SOLN: INTRAVENOUS | Status: DC

## 2012-11-04 MED ORDER — VANCOMYCIN HCL IN DEXTROSE 1-5 GM/200ML-% IV SOLN
1000.0000 mg | INTRAVENOUS | Status: AC
  Administered 2012-11-04: 1000 mg via INTRAVENOUS

## 2012-11-04 MED ORDER — PROPOFOL 10 MG/ML IV BOLUS
INTRAVENOUS | Status: DC | PRN
  Administered 2012-11-04: 170 mg via INTRAVENOUS

## 2012-11-04 MED ORDER — LACTATED RINGERS IV SOLN
INTRAVENOUS | Status: DC
  Administered 2012-11-04: 1000 mL via INTRAVENOUS

## 2012-11-04 MED ORDER — LIDOCAINE HCL (CARDIAC) 20 MG/ML IV SOLN
INTRAVENOUS | Status: DC | PRN
  Administered 2012-11-04: 100 mg via INTRAVENOUS

## 2012-11-04 MED ORDER — MEPERIDINE HCL 50 MG/ML IJ SOLN: 6.2500 mg | INTRAMUSCULAR | Status: DC | PRN

## 2012-11-04 MED ORDER — ONDANSETRON HCL 4 MG/2ML IJ SOLN
INTRAMUSCULAR | Status: DC | PRN
  Administered 2012-11-04: 4 mg via INTRAVENOUS

## 2012-11-04 SURGICAL SUPPLY — 49 items
BENZOIN TINCTURE PRP APPL 2/3 (GAUZE/BANDAGES/DRESSINGS) IMPLANT
BLADE HEX COATED 2.75 (ELECTRODE) ×2 IMPLANT
BLADE SURG 15 STRL LF DISP TIS (BLADE) ×1 IMPLANT
BLADE SURG 15 STRL SS (BLADE) ×1
CANISTER SUCTION 2500CC (MISCELLANEOUS) ×2 IMPLANT
CLOTH BEACON ORANGE TIMEOUT ST (SAFETY) ×2 IMPLANT
DECANTER SPIKE VIAL GLASS SM (MISCELLANEOUS) ×2 IMPLANT
DERMABOND ADVANCED (GAUZE/BANDAGES/DRESSINGS) ×1
DERMABOND ADVANCED .7 DNX12 (GAUZE/BANDAGES/DRESSINGS) ×1 IMPLANT
DRAIN PENROSE 18X1/2 LTX STRL (DRAIN) ×2 IMPLANT
DRAPE LAPAROTOMY TRNSV 102X78 (DRAPE) ×2 IMPLANT
ELECT REM PT RETURN 9FT ADLT (ELECTROSURGICAL) ×2
ELECTRODE REM PT RTRN 9FT ADLT (ELECTROSURGICAL) ×1 IMPLANT
GLOVE BIOGEL PI IND STRL 7.0 (GLOVE) ×1 IMPLANT
GLOVE BIOGEL PI INDICATOR 7.0 (GLOVE) ×1
GLOVE INDICATOR 8.0 STRL GRN (GLOVE) ×4 IMPLANT
GLOVE SS BIOGEL STRL SZ 8 (GLOVE) ×1 IMPLANT
GLOVE SUPERSENSE BIOGEL SZ 8 (GLOVE) ×1
GOWN STRL NON-REIN LRG LVL3 (GOWN DISPOSABLE) ×2 IMPLANT
GOWN STRL REIN XL XLG (GOWN DISPOSABLE) ×4 IMPLANT
KIT BASIN OR (CUSTOM PROCEDURE TRAY) ×2 IMPLANT
MESH HERNIA SYS ULTRAPRO LRG (Mesh General) ×2 IMPLANT
NEEDLE HYPO 25X1 1.5 SAFETY (NEEDLE) ×2 IMPLANT
NS IRRIG 1000ML POUR BTL (IV SOLUTION) ×2 IMPLANT
PACK BASIC VI WITH GOWN DISP (CUSTOM PROCEDURE TRAY) ×2 IMPLANT
PENCIL BUTTON HOLSTER BLD 10FT (ELECTRODE) ×2 IMPLANT
SPONGE GAUZE 4X4 12PLY (GAUZE/BANDAGES/DRESSINGS) IMPLANT
SPONGE LAP 18X18 X RAY DECT (DISPOSABLE) ×2 IMPLANT
STRIP CLOSURE SKIN 1/2X4 (GAUZE/BANDAGES/DRESSINGS) IMPLANT
SUT MNCRL AB 4-0 PS2 18 (SUTURE) ×2 IMPLANT
SUT NOVA 0 T19/GS 22DT (SUTURE) IMPLANT
SUT NOVA NAB DX-16 0-1 5-0 T12 (SUTURE) ×4 IMPLANT
SUT NOVA NAB GS-21 0 18 T12 DT (SUTURE) ×4 IMPLANT
SUT SILK 2 0 SH (SUTURE) IMPLANT
SUT SILK 3 0 (SUTURE)
SUT SILK 3-0 18XBRD TIE 12 (SUTURE) IMPLANT
SUT VIC AB 0 CT1 36 (SUTURE) ×2 IMPLANT
SUT VIC AB 2-0 SH 27 (SUTURE) ×1
SUT VIC AB 2-0 SH 27X BRD (SUTURE) ×1 IMPLANT
SUT VIC AB 3-0 SH 18 (SUTURE) ×2 IMPLANT
SUT VIC AB 3-0 SH 27 (SUTURE) ×1
SUT VIC AB 3-0 SH 27XBRD (SUTURE) ×1 IMPLANT
SUT VICRYL 0 27 CT2 27 ABS (SUTURE) ×2 IMPLANT
SUT VICRYL 3 0 BR 18  UND (SUTURE) ×1
SUT VICRYL 3 0 BR 18 UND (SUTURE) ×1 IMPLANT
SYR BULB IRRIGATION 50ML (SYRINGE) ×2 IMPLANT
SYR CONTROL 10ML LL (SYRINGE) ×2 IMPLANT
TOWEL OR 17X26 10 PK STRL BLUE (TOWEL DISPOSABLE) ×2 IMPLANT
YANKAUER SUCT BULB TIP 10FT TU (MISCELLANEOUS) ×2 IMPLANT

## 2012-11-04 NOTE — Anesthesia Postprocedure Evaluation (Signed)
  Anesthesia Post-op Note  Patient: Bobby Gonzalez  Procedure(s) Performed: Procedure(s) (LRB): HERNIA REPAIR INGUINAL ADULT (Left) INSERTION OF MESH (Left)  Patient Location: PACU  Anesthesia Type: General  Level of Consciousness: awake and alert   Airway and Oxygen Therapy: Patient Spontanous Breathing  Post-op Pain: mild  Post-op Assessment: Post-op Vital signs reviewed, Patient's Cardiovascular Status Stable, Respiratory Function Stable, Patent Airway and No signs of Nausea or vomiting  Last Vitals:  Filed Vitals:   11/04/12 1215  BP: 130/75  Pulse: 71  Temp:   Resp: 13    Post-op Vital Signs: stable   Complications: No apparent anesthesia complications

## 2012-11-04 NOTE — Interval H&P Note (Signed)
History and Physical Interval Note:  11/04/2012 9:06 AM  Bobby Gonzalez  has presented today for surgery, with the diagnosis of left inguinal hernia  The various methods of treatment have been discussed with the patient and family. After consideration of risks, benefits and other options for treatment, the patient has consented to  Procedure(s): HERNIA REPAIR INGUINAL ADULT (Left) as a surgical intervention .  The patient's history has been reviewed, patient examined, no change in status, stable for surgery.  I have reviewed the patient's chart and labs.  Questions were answered to the patient's satisfaction.     Edder Bellanca A.

## 2012-11-04 NOTE — Op Note (Signed)
Left inguinal Hernia, Open, Procedure Note with ultrapro hernia system mesh  Indications: The patient presented with a history of a left, reducible  Inguinal hernia.  The risk of hernia repair include bleeding,  Infection,   Recurrence of the hernia,  Mesh use, chronic pain,  Organ injury,  Bowel injury,  Bladder injury,   nerve injury with numbness around the incision,  Death,  and worsening of preexisting  medical problems.  The alternatives to surgery have been discussed as well..  Long term expectations of both operative and non operative treatments have been discussed.   The patient agrees to proceed.  Pre-operative Diagnosis: left reducible inguinal hernia  Post-operative Diagnosis: same  Surgeon: Harriette Bouillon A.   Assistants: none  Anesthesia: General LMA anesthesia and Local anesthesia 0.25.% bupivacaine, with epinephrine  ASA Class: 2  Procedure Details  The patient was seen again in the Holding Room. The risks, benefits, complications, treatment options, and expected outcomes were discussed with the patient. The possibilities of reaction to medication, pulmonary aspiration, perforation of viscus, bleeding, recurrent infection, the need for additional procedures, and development of a complication requiring transfusion or further operation were discussed with the patient and/or family. There was concurrence with the proposed plan, and informed consent was obtained. The site of surgery was properly noted/marked. The patient was taken to the Operating Room, identified as Bobby Gonzalez, and the procedure verified as hernia repair. A Time Out was held and the above information confirmed.  The patient was placed in the supine position and underwent induction of anesthesia, the lower abdomen and groin was prepped and draped in the standard fashion, and 0.25% Marcaine with epinephrine was used to anesthetize the skin over the mid-portion of the inguinal canal. A transverse incision was made.  Dissection was carried through the soft tissue to expose the inguinal canal and inguinal ligament along its lower edge. The external oblique fascia was split along the course of its fibers, exposing the inguinal canal. The cord and nerve were looped using a Penrose drain and reflected out of the field. The defect was exposed and a piece of prolene hernia system ultrapro mesh was and placed into  the indefect with the inner piece in the preperitoneal space and the onlay onto the floor.. Interupted 0 vicryl and  1-0 novafil suture was then used  to repair the defect, with the suture being sewn from the pubic tubercle inferiorly and superiorly along the canal to a level just beyond the internal ring. The mesh was split to allow passage of the cord  into the canal without entrapment. Ilioinguinal nerve divided to prevent entrapment.    The contents were then returned to canal and the external oblique fashion was then closed in a continuous fashion using 3-0 Vicryl suture taking care not to cause entrapment. Scarpa's layer closed with 3 0 vicryl and 4 0 monocryl used to close the skin.  Dermabond used for dressing.  Instrument, sponge, and needle counts were correct prior to closure and at the conclusion of the case.  Findings: Hernia as above  Estimated Blood Loss: Minimal         Drains: None         Total IV Fluids: 1000 mL         Specimens: none               Complications: None; patient tolerated the procedure well.         Disposition: PACU - hemodynamically stable.  Condition: stable

## 2012-11-04 NOTE — Anesthesia Preprocedure Evaluation (Addendum)
Anesthesia Evaluation  Patient identified by MRN, date of birth, ID band Patient awake    Reviewed: Allergy & Precautions, H&P , NPO status , Patient's Chart, lab work & pertinent test results  Airway Mallampati: II TM Distance: >3 FB Neck ROM: Full    Dental no notable dental hx.    Pulmonary neg pulmonary ROS,  breath sounds clear to auscultation  Pulmonary exam normal       Cardiovascular negative cardio ROS  Rhythm:Regular Rate:Normal     Neuro/Psych negative neurological ROS  negative psych ROS   GI/Hepatic negative GI ROS, Neg liver ROS, GERD-  Medicated and Controlled,  Endo/Other  negative endocrine ROS  Renal/GU negative Renal ROS  negative genitourinary   Musculoskeletal negative musculoskeletal ROS (+)   Abdominal   Peds negative pediatric ROS (+)  Hematology negative hematology ROS (+)   Anesthesia Other Findings   Reproductive/Obstetrics negative OB ROS                           Anesthesia Physical Anesthesia Plan  ASA: II  Anesthesia Plan: General   Post-op Pain Management:    Induction: Intravenous  Airway Management Planned: LMA and Oral ETT  Additional Equipment:   Intra-op Plan:   Post-operative Plan: Extubation in OR  Informed Consent: I have reviewed the patients History and Physical, chart, labs and discussed the procedure including the risks, benefits and alternatives for the proposed anesthesia with the patient or authorized representative who has indicated his/her understanding and acceptance.   Dental advisory given  Plan Discussed with: CRNA  Anesthesia Plan Comments:         Anesthesia Quick Evaluation

## 2012-11-04 NOTE — Discharge Instructions (Signed)
[facility] _______[facility]  UMBILICAL OR INGUINAL HERNIA REPAIR: POST OP INSTRUCTIONS  Always review your discharge instruction sheet given to you by the facility where your surgery was performed. IF YOU HAVE DISABILITY OR FAMILY LEAVE FORMS, YOU MUST BRING THEM TO THE OFFICE FOR PROCESSING.   DO NOT GIVE THEM TO YOUR DOCTOR.  1. A  prescription for pain medication may be given to you upon discharge.  Take your pain medication as prescribed, if needed.  If narcotic pain medicine is not needed, then you may take acetaminophen (Tylenol) or ibuprofen (Advil) as needed. 2. Take your usually prescribed medications unless otherwise directed. 3. If you need a refill on your pain medication, please contact your pharmacy.  They will contact our office to request authorization. Prescriptions will not be filled after 5 pm or on week-ends. 4. You should follow a light diet the first 24 hours after arrival home, such as soup and crackers, etc.  Be sure to include lots of fluids daily.  Resume your normal diet the day after surgery. 5. Most patients will experience some swelling and bruising around the umbilicus or in the groin and scrotum.  Ice packs and reclining will help.  Swelling and bruising can take several days to resolve.  6. It is common to experience some constipation if taking pain medication after surgery.  Increasing fluid intake and taking a stool softener (such as Colace) will usually help or prevent this problem from occurring.  A mild laxative (Milk of Magnesia or Miralax) should be taken according to package directions if there are no bowel movements after 48 hours. 7. Unless discharge instructions indicate otherwise, you may remove your bandages 24-48 hours after surgery, and you may shower at that time.  You may have steri-strips (small skin tapes) in place directly over the incision.  These strips should be left on the skin for 7-10 days.  If your surgeon used skin glue on the  incision, you may shower in 24 hours.  The glue will flake off over the next 2-3 weeks.  Any sutures or staples will be removed at the office during your follow-up visit. 8. ACTIVITIES:  You may resume regular (light) daily activities beginning the next day--such as daily self-care, walking, climbing stairs--gradually increasing activities as tolerated.  You may have sexual intercourse when it is comfortable.  Refrain from any heavy lifting or straining until approved by your doctor. a. You may drive when you are no longer taking prescription pain medication, you can comfortably wear a seatbelt, and you can safely maneuver your car and apply brakes. b. RETURN TO WORK:  __________________________________________________________ 9. You should see your doctor in the office for a follow-up appointment approximately 2-3 weeks after your surgery.  Make sure that you call for this appointment within a day or two after you arrive home to insure a convenient appointment time. 10. OTHER INSTRUCTIONS:  __________________________________________________________________________________________________________________________________________________________________________________________  WHEN TO CALL YOUR DOCTOR: 1. Fever over 101.0 2. Inability to urinate 3. Nausea and/or vomiting 4. Extreme swelling or bruising 5. Continued bleeding from incision. 6. Increased pain, redness, or drainage from the incision  The clinic staff is available to answer your questions during regular business hours.  Please don't hesitate to call and ask to speak to one of the nurses for clinical concerns.  If you have a medical emergency, go to the nearest emergency room or call 911.  A surgeon from [facility] is always on call at the hospital     [address], [address], [place], [place]  [address] ?  [address] [address], [place], [place]   [address] [phone] ? [phone] ? FAX [phone] Web site:  [email or web address]  

## 2012-11-04 NOTE — Transfer of Care (Signed)
Immediate Anesthesia Transfer of Care Note  Patient: Bobby Gonzalez  Procedure(s) Performed: Procedure(s): HERNIA REPAIR INGUINAL ADULT (Left) INSERTION OF MESH (Left)  Patient Location: PACU  Anesthesia Type:General  Level of Consciousness: sedated  Airway & Oxygen Therapy: Patient Spontanous Breathing and Patient connected to face mask oxygen  Post-op Assessment: Report given to PACU RN and Post -op Vital signs reviewed and stable  Post vital signs: Reviewed and stable  Complications: No apparent anesthesia complications

## 2012-11-04 NOTE — H&P (View-Only) (Signed)
Patient ID: Bobby Gonzalez, male   DOB: 10-Aug-1966, 47 y.o.   MRN: [id number]  Chief Complaint  Patient presents with  . New Evaluation    eval hernia    HPI Bobby Gonzalez is a 47 y.o. male.  Patient sent at request of Dr. Carmela Hurt for left inguinal hernia. Patient noted one week ago bulge in left groin which is more painful after prolonged standing. He gets smaller when he is recumbent. The pain is mild to moderate in intensity and slowly getting worse especially with standing. Recent history of renal cell carcinoma. Patient has a gastrostomy tube currently but he is no longer in use. HPI  Past Medical History  Diagnosis Date  . GERD (gastroesophageal reflux disease)   . Allergy     pcns  . Diverticula of colon 03/10/12    multiple rectosigmoid colonic diverticula/ ct abd/pelvis  . Chronic kidney disease     right renal mass 7.3x6.7x6.8cm renal cell ca  . Renal mass 03/30/2012  . Dehydration 05/13/2012  . Phlegm in throat 05/13/2012  . History of radiation therapy 03/30/12-05/15/12    left tonsil  . HTN (hypertension) 06-25-12    at first start of chemo for squamous cell cancer of neck-left  . 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  . 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 radiation and chemo 05-13-12(Dr. Gaylyn Rong)  . Renal cell carcinoma 09/09/2012    Past Surgical History  Procedure Laterality Date  . Wisdom tooth extraction    . Direct laryngoscopy      S/P DL, Biopsy-Dr. Pollyann Kennedy. Pathology postive for SCCa of Left Tonsil  . Gastrostomy tube placement  03/02/12  . Multiple tooth extractions  03/04/12    with DR. Mohorn  . Peg tube placement  06-25-12    remains in place abdomen-not using at present.  . Tonsil biopsy  02/17/12    SCCa left tomnsil,HPV positive,spread to l cervical node  . Laparoscopic nephrectomy  06/29/2012    Procedure: LAPAROSCOPIC NEPHRECTOMY;  Surgeon: Crecencio Mc, MD;   Location: [facility];  Service: Urology;  Laterality: Right;    Family History  Problem Relation Age of Onset  . Stroke Father   . Cancer Father     Prostate    Social History History  Substance Use Topics  . Smoking status: Never Smoker   . Smokeless tobacco: Never Used  . Alcohol Use: No     Comment: Never drank alocohol.    Allergies  Allergen Reactions  . Banana Other (See Comments)    Unknown   . Penicillins     unknown    Current Outpatient Prescriptions  Medication Sig Dispense Refill  . docusate sodium 100 MG CAPS Take 100 mg by mouth 2 (two) times daily.  10 capsule  0  . HYDROcodone-acetaminophen (NORCO/VICODIN) 5-325 MG per tablet Take 1-2 tablets by mouth every 6 (six) hours as needed (pain).  30 tablet  0  . pilocarpine (SALAGEN) 5 MG tablet Take 1 tablet (5 mg total) by mouth 3 (three) times daily.  90 tablet  5   Current Facility-Administered Medications  Medication Dose Route Frequency Provider Last Rate Last Dose  . clindamycin (CLEOCIN) 300 mg in dextrose 5 % 50 mL IVPB  300 mg Intravenous Once Armas Mcbee A. Treyshon Buchanon, MD        Review of Systems Review of Systems  Constitutional: Positive for fatigue.  HENT: Negative.   Eyes: Negative.   Respiratory: Negative.   Cardiovascular: Negative.   Gastrointestinal: Negative.   Endocrine: Negative.   Genitourinary: Negative.   Allergic/Immunologic: Negative.   Neurological: Negative.   Hematological: Negative.   Psychiatric/Behavioral: Negative.     Blood pressure 120/70, pulse 68, temperature 97.5 F (36.4 C), resp. rate 16, height 5\' 10"  (1.778 m), weight 161 lb (73.029 kg).  Physical Exam Physical Exam  Constitutional: He is oriented to person, place, and time. He appears well-developed and well-nourished.  HENT:  Head: Normocephalic and atraumatic.  Eyes: EOM are normal. Pupils are equal, round, and reactive to light.  Neck: Normal range of motion. Neck supple.  Cardiovascular: Normal rate and  regular rhythm.   Pulmonary/Chest: Effort normal and breath sounds normal.  Abdominal: A hernia is present. Hernia confirmed positive in the left inguinal area.    Musculoskeletal: Normal range of motion.  Neurological: He is alert and oriented to person, place, and time.  Skin: Skin is warm and dry.  Psychiatric: He has a normal mood and affect. His behavior is normal. Thought content normal.      Assessment    LIH    Plan    Repair LIH.  Pt asked about GT removal AND THIS CAN BE DONE if OK with oncology.The risk of hernia repair include bleeding,  Infection,   Recurrence of the hernia,  Mesh use, chronic pain,  Organ injury,  Bowel injury,  Bladder injury,   nerve injury with numbness around the incision,  Death,  and worsening of preexisting  medical problems.  The alternatives to surgery have been discussed as well..  Long term expectations of both operative and non operative treatments have been discussed.   The patient agrees to proceed.       Bobby Gonzalez A. 10/23/2012, 10:07 AM

## 2012-11-05 ENCOUNTER — Encounter (HOSPITAL_COMMUNITY): Payer: Self-pay | Admitting: Surgery

## 2012-11-06 ENCOUNTER — Ambulatory Visit (HOSPITAL_COMMUNITY): Admission: RE | Admit: 2012-11-06 | Payer: BC Managed Care – PPO | Source: Ambulatory Visit | Admitting: Surgery

## 2012-11-06 ENCOUNTER — Encounter (HOSPITAL_COMMUNITY): Admission: RE | Payer: Self-pay | Source: Ambulatory Visit

## 2012-11-06 SURGERY — REPAIR, HERNIA, INGUINAL, ADULT
Anesthesia: General | Laterality: Left

## 2012-11-09 ENCOUNTER — Ambulatory Visit: Payer: BC Managed Care – PPO | Attending: Oncology

## 2012-11-09 ENCOUNTER — Ambulatory Visit: Payer: BC Managed Care – PPO

## 2012-11-09 DIAGNOSIS — IMO0001 Reserved for inherently not codable concepts without codable children: Secondary | ICD-10-CM | POA: Insufficient documentation

## 2012-11-09 DIAGNOSIS — R1319 Other dysphagia: Secondary | ICD-10-CM | POA: Insufficient documentation

## 2012-11-20 ENCOUNTER — Encounter (INDEPENDENT_AMBULATORY_CARE_PROVIDER_SITE_OTHER): Payer: Self-pay | Admitting: Surgery

## 2012-11-20 ENCOUNTER — Ambulatory Visit (INDEPENDENT_AMBULATORY_CARE_PROVIDER_SITE_OTHER): Payer: BC Managed Care – PPO | Admitting: Surgery

## 2012-11-20 VITALS — BP 124/76 | HR 68 | Resp 14 | Ht 70.0 in | Wt 165.8 lb

## 2012-11-20 DIAGNOSIS — Z9889 Other specified postprocedural states: Secondary | ICD-10-CM

## 2012-11-20 NOTE — Patient Instructions (Signed)
May lift up to 25 lbs for 2 weeks then no retstrictions.  Ok to ride bike and treadmill

## 2012-11-20 NOTE — Progress Notes (Signed)
Pt returns today after left inguinal  hernia repair.  Pain is well controlled.  Bowels are functioning.  Wound is clean.  On exam:  Incision is clean /dry/intact.  Area is soft without signs of hernia recurrence.  Impression:  Status repair of hernia left inguinal   Plan:  RTC PRN  Return to full activity 2 weeks.  Instruction given

## 2013-01-05 ENCOUNTER — Encounter: Payer: Self-pay | Admitting: Radiation Oncology

## 2013-01-06 ENCOUNTER — Encounter: Payer: Self-pay | Admitting: Radiation Oncology

## 2013-01-07 ENCOUNTER — Ambulatory Visit: Payer: BC Managed Care – PPO | Admitting: Radiation Oncology

## 2013-01-11 ENCOUNTER — Encounter: Payer: Self-pay | Admitting: Radiation Oncology

## 2013-01-14 ENCOUNTER — Ambulatory Visit
Admission: RE | Admit: 2013-01-14 | Discharge: 2013-01-14 | Disposition: A | Discharge status: Home / Self Care | Payer: BC Managed Care – PPO | Source: Ambulatory Visit | Attending: Radiation Oncology | Admitting: Radiation Oncology

## 2013-01-14 VITALS — BP 130/72 | HR 69 | Temp 98.6°F | Ht 70.0 in | Wt 170.9 lb

## 2013-01-14 DIAGNOSIS — Z85528 Personal history of other malignant neoplasm of kidney: Secondary | ICD-10-CM | POA: Insufficient documentation

## 2013-01-14 DIAGNOSIS — C099 Malignant neoplasm of tonsil, unspecified: Secondary | ICD-10-CM | POA: Insufficient documentation

## 2013-01-14 DIAGNOSIS — K117 Disturbances of salivary secretion: Secondary | ICD-10-CM | POA: Insufficient documentation

## 2013-01-14 DIAGNOSIS — Z905 Acquired absence of kidney: Secondary | ICD-10-CM | POA: Insufficient documentation

## 2013-01-14 DIAGNOSIS — Z923 Personal history of irradiation: Secondary | ICD-10-CM | POA: Insufficient documentation

## 2013-01-14 MED ORDER — LARYNGOSCOPY SOLUTION RAD-ONC
15.0000 mL | Freq: Once | TOPICAL | Status: AC
  Administered 2013-01-14: 15 mL via TOPICAL

## 2013-01-14 NOTE — Addendum Note (Signed)
Encounter addended by: Eduardo Osier, RN on: 01/14/2013  4:25 PM<BR>     Documentation filed: Inpatient MAR

## 2013-01-14 NOTE — Addendum Note (Signed)
Encounter addended by: Eduardo Osier, RN on: 01/14/2013  4:22 PM<BR>     Documentation filed: Charges VN, Orders

## 2013-01-14 NOTE — Progress Notes (Addendum)
Mr. Dax here with his wife for follow up after treatment to his left tonsil.  He reports pain under both his shoulder blades that he is rating at a 2/10.  He also is reporting a dry mouth.  He is able to eat but has trouble with bread.  He states he occasionally has a sore throat after eating.  He does have fatigue and is wondering when his energy level will return to normal.

## 2013-01-14 NOTE — Progress Notes (Signed)
  Radiation Oncology         [phone] ________________________________  Name: Bobby Gonzalez MRN: [id number]  Date: 01/14/2013  DOB: 04-Jun-1966  Follow-Up Visit Note  CC: [name], MD  Serena Colonel, MD  Diagnosis:   Squamous cell carcinoma of the left tonsil. The patient is also status post nephrectomy for a renal cell carcinoma.  Interval Since Last Radiation:  9 months   Narrative:  The patient returns today for routine follow-up.  The patient states that he is doing fairly well. He does complain of some ongoing xerostomia. No pain in the head neck area. No dysphasia or odynophagia. No voice change. He is able to 8 what he wants at this time to a large extent although he needs to still avoid certain foods such as bread which are quite dry. Ongoing change in taste.                              ALLERGIES:  is allergic to banana and penicillins.  Meds: Current Outpatient Prescriptions  Medication Sig Dispense Refill  . Famotidine (PEPCID AC PO) Take by mouth. WOULD TAKE IF NEEDED FOR REFLUX      . oxyCODONE-acetaminophen (ROXICET) 5-325 MG per tablet Take 1 tablet by mouth every 4 (four) hours as needed for pain.  30 tablet  0  . traMADol (ULTRAM) 50 MG tablet Take 1 tablet (50 mg total) by mouth every 6 (six) hours as needed for pain.  30 tablet  0   No current facility-administered medications for this encounter.    Physical Findings: The patient is in no acute distress. Patient is alert and oriented.  height is 5\' 10"  (1.778 m) and weight is 170 lb 14.4 oz (77.52 kg). His temperature is 98.6 F (37 C). His blood pressure is 130/72 and his pulse is 69. Marland Kitchen   General: Well-developed, in no acute distress HEENT: Normocephalic, atraumatic; oral cavity clear no lesions, suspicious findings within the oral cavity/oropharynx/tonsil regions Cardiovascular: Regular rate and rhythm Respiratory: Clear to auscultation bilaterally GI: Soft, nontender, normal bowel  sounds Extremities: No edema present  Fiberoptic exam: After the use of topical anesthetic, the flexible laryngoscope was passed through the right nare. Good visualization was obtained. No lesions or suspicious findings within the larynx, hypopharynx, oropharynx or nasopharynx.    Lab Findings: Lab Results  Component Value Date   WBC 4.0 10/30/2012   HGB 15.0 10/30/2012   HCT 43.4 10/30/2012   MCV 89.5 10/30/2012   PLT 207 10/30/2012     Radiographic Findings: No results found.  Impression:     The patient clinically is doing fairly well. Clinically NED. Some ongoing xerostomia/change in taste.   Plan:   followup in 6 months.    Radene Gunning, M.D., Ph.D.

## 2013-02-12 ENCOUNTER — Ambulatory Visit (HOSPITAL_BASED_OUTPATIENT_CLINIC_OR_DEPARTMENT_OTHER): Payer: BC Managed Care – PPO | Admitting: Oncology

## 2013-02-12 ENCOUNTER — Other Ambulatory Visit (HOSPITAL_BASED_OUTPATIENT_CLINIC_OR_DEPARTMENT_OTHER): Payer: BC Managed Care – PPO

## 2013-02-12 ENCOUNTER — Telehealth: Payer: Self-pay | Admitting: Oncology

## 2013-02-12 VITALS — BP 123/71 | HR 71 | Temp 97.7°F | Resp 18 | Ht 70.0 in | Wt 175.8 lb

## 2013-02-12 DIAGNOSIS — C642 Malignant neoplasm of left kidney, except renal pelvis: Secondary | ICD-10-CM

## 2013-02-12 DIAGNOSIS — C099 Malignant neoplasm of tonsil, unspecified: Secondary | ICD-10-CM

## 2013-02-12 DIAGNOSIS — C649 Malignant neoplasm of unspecified kidney, except renal pelvis: Secondary | ICD-10-CM

## 2013-02-12 DIAGNOSIS — K117 Disturbances of salivary secretion: Secondary | ICD-10-CM

## 2013-02-12 LAB — TSH: TSH: 10.043 u[IU]/mL — ABNORMAL HIGH (ref 0.350–4.500)

## 2013-02-12 LAB — CBC WITH DIFFERENTIAL/PLATELET
BASO%: 0.2 % (ref 0.0–2.0)
Basophils Absolute: 0 10*3/uL (ref 0.0–0.1)
EOS%: 1.8 % (ref 0.0–7.0)
Eosinophils Absolute: 0.1 10*3/uL (ref 0.0–0.5)
HCT: 40.6 % (ref 38.4–49.9)
HGB: 14.3 g/dL (ref 13.0–17.1)
LYMPH%: 8.2 % — ABNORMAL LOW (ref 14.0–49.0)
MCH: 31.2 pg (ref 27.2–33.4)
MCHC: 35.2 g/dL (ref 32.0–36.0)
MCV: 88.9 fL (ref 79.3–98.0)
MONO#: 0.5 10*3/uL (ref 0.1–0.9)
MONO%: 11.5 % (ref 0.0–14.0)
NEUT#: 3.5 10*3/uL (ref 1.5–6.5)
NEUT%: 78.3 % — ABNORMAL HIGH (ref 39.0–75.0)
Platelets: 185 10*3/uL (ref 140–400)
RBC: 4.57 10*6/uL (ref 4.20–5.82)
RDW: 13.5 % (ref 11.0–14.6)
WBC: 4.4 10*3/uL (ref 4.0–10.3)
lymph#: 0.4 10*3/uL — ABNORMAL LOW (ref 0.9–3.3)

## 2013-02-12 LAB — COMPREHENSIVE METABOLIC PANEL (CC13)
ALT: 12 U/L (ref 0–55)
AST: 11 U/L (ref 5–34)
Albumin: 3.8 g/dL (ref 3.5–5.0)
Alkaline Phosphatase: 55 U/L (ref 40–150)
BUN: 14.8 mg/dL (ref 7.0–26.0)
CO2: 25 mEq/L (ref 22–29)
Calcium: 9.3 mg/dL (ref 8.4–10.4)
Chloride: 106 mEq/L (ref 98–107)
Creatinine: 1.3 mg/dL (ref 0.7–1.3)
Glucose: 119 mg/dl — ABNORMAL HIGH (ref 70–99)
Potassium: 3.9 mEq/L (ref 3.5–5.1)
Sodium: 140 mEq/L (ref 136–145)
Total Bilirubin: 0.61 mg/dL (ref 0.20–1.20)
Total Protein: 6.7 g/dL (ref 6.4–8.3)

## 2013-02-12 MED ORDER — PILOCARPINE HCL 5 MG PO TABS: 5.0000 mg | ORAL_TABLET | Freq: Three times a day (TID) | ORAL | Status: DC

## 2013-02-12 NOTE — Progress Notes (Signed)
[facility]  [phone] [phone]   OFFICE PROGRESS NOTE   Cc:  [name], MD  DIAGNOSIS:  1. 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 Right renal mass with positive PET scan; pending evaluation and workup.  2. Right renal cell carcinoma, clear cell subtype; grade III/IV; pT1b.    PAST THERAPY:  1. Due to start concurrent chemo radiation today 03/30/2012 with daily XRT and weekly Cisplatin.  2. Right nephrectomy on 06/29/2012.   CURRENT THERAPY:  Watchful observation.   INTERVAL HISTORY: Bobby Gonzalez 47 y.o. male returns for regular followup visit with his wife.  He still has xerostomia and not able to eat bread.  He did not pick up his Salagen Rx last time.  He denied dysphagia, odynophagia, mucositis, palpable neck nodes, neck swelling, back pain, hematuria.  He is working full time.  However, he still has mild fatigue.  He has not been working out.  The rest of the 14-point review of system was negative.    Past Medical History  Diagnosis Date  . GERD (gastroesophageal reflux disease)   . Allergy     pcns  . Diverticula of colon 03/10/12    multiple rectosigmoid colonic diverticula/ ct abd/pelvis  . Chronic kidney disease     right renal mass 7.3x6.7x6.8cm renal cell ca  . Renal mass 03/30/2012  . Dehydration 05/13/2012  . Phlegm in throat 05/13/2012  . History of radiation therapy 03/30/12-05/15/12    left tonsil  . HTN (hypertension) 06-25-12    at first start of chemo for squamous cell cancer of neck-left  . 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  . 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 radiation and chemo 05-13-12(Dr. Gaylyn Rong)  . Renal cell carcinoma 09/09/2012  . Pain     JOINT PAINS UPPER BODY AFTER RADIATION TREATMENTS  . Difficult intubation     POSSIBLE DIFFICULT INTUBATION--S/P RADIATION FOR  CANCER LEFT TONSIL--EATING BUT THROAT STILL SORE AND PT HAS THICK MUCUS    Past Surgical History  Procedure Laterality Date  . Wisdom tooth extraction    . Direct laryngoscopy      S/P DL, Biopsy-Dr. Pollyann Kennedy. Pathology postive for SCCa of Left Tonsil  . Gastrostomy tube placement  03/02/12  . Multiple tooth extractions  03/04/12    with DR. Mohorn  . Peg tube placement  06-25-12    remains in place abdomen-not using at present.  . Tonsil biopsy  02/17/12    SCCa left tomnsil,HPV positive,spread to l cervical node  . Laparoscopic nephrectomy  06/29/2012    Procedure: LAPAROSCOPIC NEPHRECTOMY;  Surgeon: Crecencio Mc, MD;  Location: [facility];  Service: Urology;  Laterality: Right;  . Peg tube removal  10/2012  . Inguinal hernia repair Left 11/04/2012    Procedure: HERNIA REPAIR INGUINAL ADULT;  Surgeon: Clovis Pu. Cornett, MD;  Location: [facility];  Service: General;  Laterality: Left;  . Insertion of mesh Left 11/04/2012    Procedure: INSERTION OF MESH;  Surgeon: Clovis Pu. Cornett, MD;  Location: [facility];  Service: General;  Laterality: Left;    Current Outpatient Prescriptions  Medication Sig Dispense Refill  . Famotidine (PEPCID AC PO) Take by mouth. WOULD TAKE IF NEEDED FOR REFLUX      . pilocarpine (SALAGEN) 5 MG tablet Take 1 tablet (5 mg total) by mouth 3 (  three) times daily.  90 tablet  5   No current facility-administered medications for this visit.    ALLERGIES:  is allergic to banana and penicillins.  REVIEW OF SYSTEMS:  The rest of the 14-point review of system was negative.   Filed Vitals:   02/12/13 1428  BP: 123/71  Pulse: 71  Temp: 97.7 F (36.5 C)  Resp: 18   Wt Readings from Last 3 Encounters:  02/12/13 175 lb 12.8 oz (79.742 kg)  01/14/13 170 lb 14.4 oz (77.52 kg)  11/20/12 165 lb 12.8 oz (75.206 kg)   ECOG Performance status: 1  PHYSICAL EXAMINATION:  General: thin-appearing man, in no acute distress. Eyes: no scleral icterus. ENT: There were no oropharyngeal lesions.    Neck was without thyromegaly. Lymphatics: Positive for a 1 cm left level II/III cercical node. There was no supraclavicular or axillary adenopathy. Respiratory: lungs were clear bilaterally without wheezing or crackles. Cardiovascular: Regular rate and rhythm, S1/S2, without murmur, rub or gallop. There was no pedal edema. GI: abdomen was soft, flat, nontender, nondistended, without organomegaly.  PEG tube was dry, clean, intact. Muscoloskeletal: no spinal tenderness of palpation of vertebral spine. Skin exam was without echymosis, petichae. Neuro exam was nonfocal. Patient was able to get on and off exam table without assistance. Gait was normal. Patient was alerted and oriented. Attention was good. Language was appropriate. Mood was normal without depression. Speech was not pressured. Thought content was not tangential.     LABORATORY/RADIOLOGY DATA:  Lab Results  Component Value Date   WBC 4.4 02/12/2013   HGB 14.3 02/12/2013   HCT 40.6 02/12/2013   PLT 185 02/12/2013   GLUCOSE 119* 02/12/2013   ALKPHOS 55 02/12/2013   ALT 12 02/12/2013   AST 11 02/12/2013   NA 140 02/12/2013   K 3.9 02/12/2013   CL 106 02/12/2013   CREATININE 1.3 02/12/2013   BUN 14.8 02/12/2013   CO2 25 02/12/2013   INR 0.94 03/02/2012     ASSESSMENT AND PLAN:   1. History of right RCC:  S/p nephrectomy, on observation.  I defer to Dr. Laverle Patter as to his protocol for surveillance CT scan.  2. History of left tonsil squamous cell carcinoma:  - He continues to be in remission.  - He is not smoking, chewing tobacco, or drinking EtOH.   3.  Xerostomia:  I prescribed Salagen again.  He did not pick this up last time.     4. History of Calorie/protein:  Resolved.   5.  Submental lymphedema: He attended lymphedema clinic.  This is improved compared to before.   6.  Slight decreased in stamina:  TSH is pending today.  I advised him to resume exercise program with goal to increase his tolerance.   7. Follow up: In about 6  months with CT neck the day prior.  I informed Mr. Georgiou that if he has no symptoms, it's OK to cancel this CT scan.    I informed Mr. Mercer that I am leaving the practice.  [facility] will [facility] for him to see another provider when he returns.     The length of time of the face-to-face encounter was 15 minutes. More than 50% of time was spent counseling and coordination of care.

## 2013-02-15 ENCOUNTER — Encounter: Payer: Self-pay | Admitting: Oncology

## 2013-02-15 ENCOUNTER — Telehealth: Payer: Self-pay | Admitting: *Deleted

## 2013-02-15 ENCOUNTER — Other Ambulatory Visit: Payer: Self-pay | Admitting: Oncology

## 2013-02-15 DIAGNOSIS — E039 Hypothyroidism, unspecified: Secondary | ICD-10-CM | POA: Insufficient documentation

## 2013-02-15 HISTORY — DX: Hypothyroidism, unspecified: E03.9

## 2013-02-15 MED ORDER — LEVOTHYROXINE SODIUM 50 MCG PO TABS: 50.0000 ug | ORAL_TABLET | Freq: Every day | ORAL | Status: DC

## 2013-02-15 NOTE — Telephone Encounter (Signed)
Wife called asking about clarification of a message Dr. Gaylyn Rong left for pt.  Pt is out of town and wife asks if message can be explained to her because he seemed a little upset about it.  It had to do with is Thyroid.

## 2013-02-15 NOTE — Telephone Encounter (Signed)
Synthroid PO daily.  Lab check in 2 and 4 months to see if the level is right.  It's very common to get low thyroid with radiation.  Nothing to get upset about.

## 2013-02-15 NOTE — Telephone Encounter (Signed)
Left detailed VM on wife's cell phone explaining Dr. Lodema Pilot message below.  Rx was sent to [facility] outpt pharmacy.  Please call back if any questions or need med sent to another pharmacy.

## 2013-02-16 ENCOUNTER — Telehealth: Payer: Self-pay | Admitting: Oncology

## 2013-02-16 NOTE — Telephone Encounter (Signed)
lvm for pt regarding to Aug, Oct and Dec appt....mailed pt appt sched and letter

## 2013-02-19 NOTE — Telephone Encounter (Signed)
e

## 2013-04-08 ENCOUNTER — Telehealth: Payer: Self-pay | Admitting: *Deleted

## 2013-04-08 NOTE — Telephone Encounter (Signed)
CALLED PATIENT TO INQUIRE IF HE WANTS TO COME IN TODAY TO SEE DR. MOODY FOR A FU VISIT, HE TOLD ME THAT HE SAW HIS SURGEON AND THAT HE IS OK AND THAT HE DOESN'T NEED TO COME IN FOR A VISIT, NOTIFIED KAREN DR. MOODY'S NURSE AND DR. Mitzi Hansen

## 2013-04-16 ENCOUNTER — Other Ambulatory Visit: Payer: Self-pay | Admitting: Hematology and Oncology

## 2013-04-16 ENCOUNTER — Other Ambulatory Visit (HOSPITAL_BASED_OUTPATIENT_CLINIC_OR_DEPARTMENT_OTHER): Payer: BC Managed Care – PPO

## 2013-04-16 ENCOUNTER — Telehealth: Payer: Self-pay

## 2013-04-16 DIAGNOSIS — C649 Malignant neoplasm of unspecified kidney, except renal pelvis: Secondary | ICD-10-CM

## 2013-04-16 DIAGNOSIS — C099 Malignant neoplasm of tonsil, unspecified: Secondary | ICD-10-CM

## 2013-04-16 DIAGNOSIS — E039 Hypothyroidism, unspecified: Secondary | ICD-10-CM

## 2013-04-16 LAB — TSH CHCC: TSH: 4.201 m(IU)/L — ABNORMAL HIGH (ref 0.320–4.118)

## 2013-04-16 NOTE — Telephone Encounter (Signed)
S/w pt that TSH is OK and to keep Oct appt.

## 2013-04-16 NOTE — Telephone Encounter (Signed)
Message copied by Gaylord Shih on Fri Apr 16, 2013 11:45 AM ------      Message from: Wende Mott      Created: Fri Apr 16, 2013 11:39 AM                   ----- Message -----         From: Myrtis Ser, NP         Sent: 04/16/2013  11:25 AM           To: Marlowe Aschoff, RN            Call pt. TSH is okay at dose of 50 mcg daily. Recheck in Oct as scheduled. ------

## 2013-05-10 ENCOUNTER — Other Ambulatory Visit: Payer: Self-pay

## 2013-05-13 ENCOUNTER — Ambulatory Visit: Payer: Self-pay | Admitting: Oncology

## 2013-05-13 ENCOUNTER — Other Ambulatory Visit: Payer: Self-pay | Admitting: Lab

## 2013-06-15 ENCOUNTER — Telehealth: Payer: Self-pay | Admitting: Oncology

## 2013-06-15 NOTE — Telephone Encounter (Signed)
pt called to confirm friday appt...done

## 2013-06-17 ENCOUNTER — Other Ambulatory Visit: Payer: Self-pay | Admitting: Hematology and Oncology

## 2013-06-17 DIAGNOSIS — C099 Malignant neoplasm of tonsil, unspecified: Secondary | ICD-10-CM

## 2013-06-18 ENCOUNTER — Encounter (INDEPENDENT_AMBULATORY_CARE_PROVIDER_SITE_OTHER): Payer: Self-pay

## 2013-06-18 ENCOUNTER — Other Ambulatory Visit (HOSPITAL_BASED_OUTPATIENT_CLINIC_OR_DEPARTMENT_OTHER): Payer: BC Managed Care – PPO

## 2013-06-18 DIAGNOSIS — C099 Malignant neoplasm of tonsil, unspecified: Secondary | ICD-10-CM

## 2013-06-18 LAB — CBC WITH DIFFERENTIAL/PLATELET
BASO%: 0.3 % (ref 0.0–2.0)
Basophils Absolute: 0 10*3/uL (ref 0.0–0.1)
EOS%: 2 % (ref 0.0–7.0)
Eosinophils Absolute: 0.1 10*3/uL (ref 0.0–0.5)
HCT: 44.4 % (ref 38.4–49.9)
HGB: 15.2 g/dL (ref 13.0–17.1)
LYMPH%: 9.2 % — ABNORMAL LOW (ref 14.0–49.0)
MCH: 30.8 pg (ref 27.2–33.4)
MCHC: 34.3 g/dL (ref 32.0–36.0)
MCV: 90 fL (ref 79.3–98.0)
MONO#: 0.6 10*3/uL (ref 0.1–0.9)
MONO%: 12.3 % (ref 0.0–14.0)
NEUT#: 3.5 10*3/uL (ref 1.5–6.5)
NEUT%: 76.2 % — ABNORMAL HIGH (ref 39.0–75.0)
Platelets: 176 10*3/uL (ref 140–400)
RBC: 4.94 10*6/uL (ref 4.20–5.82)
RDW: 13.2 % (ref 11.0–14.6)
WBC: 4.5 10*3/uL (ref 4.0–10.3)
lymph#: 0.4 10*3/uL — ABNORMAL LOW (ref 0.9–3.3)

## 2013-06-18 LAB — COMPREHENSIVE METABOLIC PANEL (CC13)
ALT: 16 U/L (ref 0–55)
AST: 19 U/L (ref 5–34)
Albumin: 3.9 g/dL (ref 3.5–5.0)
Alkaline Phosphatase: 70 U/L (ref 40–150)
Anion Gap: 8 mEq/L (ref 3–11)
BUN: 17.9 mg/dL (ref 7.0–26.0)
CO2: 26 mEq/L (ref 22–29)
Calcium: 9.7 mg/dL (ref 8.4–10.4)
Chloride: 106 mEq/L (ref 98–109)
Creatinine: 1.5 mg/dL — ABNORMAL HIGH (ref 0.7–1.3)
Glucose: 98 mg/dl (ref 70–140)
Potassium: 4.6 mEq/L (ref 3.5–5.1)
Sodium: 140 mEq/L (ref 136–145)
Total Bilirubin: 0.52 mg/dL (ref 0.20–1.20)
Total Protein: 7 g/dL (ref 6.4–8.3)

## 2013-06-18 LAB — T4, FREE: Free T4: 1.34 ng/dL (ref 0.80–1.80)

## 2013-06-18 LAB — TSH CHCC: TSH: 3.55 m(IU)/L (ref 0.320–4.118)

## 2013-06-29 ENCOUNTER — Telehealth: Payer: Self-pay | Admitting: Hematology and Oncology

## 2013-06-29 NOTE — Telephone Encounter (Signed)
Moved 12/19 appt to NG. S/w pt re new time for 12/19 @ 10am w/NG. Also confirmed 12/12 lb/ct.

## 2013-07-15 ENCOUNTER — Ambulatory Visit
Admission: RE | Admit: 2013-07-15 | Discharge: 2013-07-15 | Disposition: A | Discharge status: Home / Self Care | Payer: BC Managed Care – PPO | Source: Ambulatory Visit | Attending: Radiation Oncology | Admitting: Radiation Oncology

## 2013-07-15 ENCOUNTER — Encounter: Payer: Self-pay | Admitting: Radiation Oncology

## 2013-07-15 VITALS — BP 125/75 | HR 61 | Temp 98.3°F | Resp 20 | Wt 183.5 lb

## 2013-07-15 DIAGNOSIS — C099 Malignant neoplasm of tonsil, unspecified: Secondary | ICD-10-CM | POA: Insufficient documentation

## 2013-07-15 DIAGNOSIS — K117 Disturbances of salivary secretion: Secondary | ICD-10-CM | POA: Insufficient documentation

## 2013-07-15 DIAGNOSIS — Z905 Acquired absence of kidney: Secondary | ICD-10-CM | POA: Insufficient documentation

## 2013-07-15 DIAGNOSIS — C649 Malignant neoplasm of unspecified kidney, except renal pelvis: Secondary | ICD-10-CM | POA: Insufficient documentation

## 2013-07-15 MED ORDER — LARYNGOSCOPY SOLUTION RAD-ONC
15.0000 mL | Freq: Once | TOPICAL | Status: AC
  Administered 2013-07-15: 15 mL via TOPICAL
  Filled 2013-07-15: qty 15

## 2013-07-15 NOTE — Progress Notes (Signed)
follow up  Tonsil rad txs: 107/29/13-9/13/13, no fatigue, dry mouth at times,  Certain foods he doesn't eat, bread/crackers,steaks, not eating, no c/o pain, back working full time, last TSH 06/18/13=3.550, T4=1.34,  Next CT 08/03/13, and  DR. Bertis Ruddy appt 08/20/13

## 2013-07-20 NOTE — Progress Notes (Signed)
  Radiation Oncology         [phone] ________________________________  Name: Bobby Gonzalez MRN: [id number]  Date: 07/15/2013  DOB: 1966-05-23  Follow-Up Visit Note  CC: [name], MD  Serena Colonel, MD  Diagnosis:   Squamous cell carcinoma of the left tonsil. The patient is also status post nephrectomy for a renal cell carcinoma.  Interval Since Last Radiation:  15 months   Narrative:  The patient returns today for routine follow-up.  The patient states that he is doing fairly well. She complains of some ongoing xerostomia and takes water with him most times. This affects what foods he keeps with him doing better with more moist foods. He denies any dysphasia or odynophagia. The patient currently is on Synthroid. No significant new complaints in the head neck region. He is scheduled for a CT scan next month.                              ALLERGIES:  is allergic to banana and penicillins.  Meds: Current Outpatient Prescriptions  Medication Sig Dispense Refill  . levothyroxine (SYNTHROID) 50 MCG tablet Take 1 tablet (50 mcg total) by mouth daily before breakfast.  30 tablet  5  . pilocarpine (SALAGEN) 5 MG tablet Take 1 tablet (5 mg total) by mouth 3 (three) times daily.  90 tablet  5  . Famotidine (PEPCID AC PO) Take by mouth. WOULD TAKE IF NEEDED FOR REFLUX       No current facility-administered medications for this encounter.    Physical Findings: The patient is in no acute distress. Patient is alert and oriented.  weight is 183 lb 8 oz (83.235 kg). His oral temperature is 98.3 F (36.8 C). His blood pressure is 125/75 and his pulse is 61. His respiration is 20. Marland Kitchen   General: Well-developed, in no acute distress HEENT: Normocephalic, atraumatic; oral cavity clear no lesions, suspicious findings within the oral cavity/oropharynx/tonsil regions.  Cardiovascular: Regular rate and rhythm Respiratory: Clear to auscultation bilaterally GI: Soft, nontender, normal bowel  sounds Extremities: No edema present  Fiberoptic exam: After the use of topical anesthetic, the flexible laryngoscope was passed through the right nare. Good visualization was obtained. No lesions or suspicious findings within the larynx, hypopharynx, oropharynx or nasopharynx.    Lab Findings: Lab Results  Component Value Date   WBC 4.5 06/18/2013   HGB 15.2 06/18/2013   HCT 44.4 06/18/2013   MCV 90.0 06/18/2013   PLT 176 06/18/2013     Radiographic Findings: No results found.  Assessment:  The patient remains clinically NED. No significant new complaints today.  Plan:   followup in 6 months.   I spent 15 minutes with the patient today, the majority of which was spent counseling the patient on the diagnosis of cancer and coordinating care.   Radene Gunning, M.D., Ph.D.

## 2013-08-12 ENCOUNTER — Other Ambulatory Visit: Payer: Self-pay | Admitting: Hematology and Oncology

## 2013-08-12 DIAGNOSIS — C649 Malignant neoplasm of unspecified kidney, except renal pelvis: Secondary | ICD-10-CM

## 2013-08-12 DIAGNOSIS — C099 Malignant neoplasm of tonsil, unspecified: Secondary | ICD-10-CM

## 2013-08-13 ENCOUNTER — Encounter (HOSPITAL_COMMUNITY): Payer: Self-pay

## 2013-08-13 ENCOUNTER — Other Ambulatory Visit: Payer: Self-pay

## 2013-08-13 ENCOUNTER — Encounter (INDEPENDENT_AMBULATORY_CARE_PROVIDER_SITE_OTHER): Payer: Self-pay

## 2013-08-13 ENCOUNTER — Other Ambulatory Visit (HOSPITAL_BASED_OUTPATIENT_CLINIC_OR_DEPARTMENT_OTHER): Payer: BC Managed Care – PPO

## 2013-08-13 ENCOUNTER — Ambulatory Visit (HOSPITAL_COMMUNITY)
Admission: RE | Admit: 2013-08-13 | Discharge: 2013-08-13 | Disposition: A | Discharge status: Home / Self Care | Payer: BC Managed Care – PPO | Source: Ambulatory Visit | Attending: Oncology | Admitting: Oncology

## 2013-08-13 DIAGNOSIS — C099 Malignant neoplasm of tonsil, unspecified: Secondary | ICD-10-CM

## 2013-08-13 DIAGNOSIS — C649 Malignant neoplasm of unspecified kidney, except renal pelvis: Secondary | ICD-10-CM | POA: Insufficient documentation

## 2013-08-13 DIAGNOSIS — J358 Other chronic diseases of tonsils and adenoids: Secondary | ICD-10-CM | POA: Insufficient documentation

## 2013-08-13 DIAGNOSIS — Z923 Personal history of irradiation: Secondary | ICD-10-CM | POA: Insufficient documentation

## 2013-08-13 DIAGNOSIS — J3489 Other specified disorders of nose and nasal sinuses: Secondary | ICD-10-CM | POA: Insufficient documentation

## 2013-08-13 LAB — COMPREHENSIVE METABOLIC PANEL (CC13)
ALT: 15 U/L (ref 0–55)
AST: 17 U/L (ref 5–34)
Albumin: 3.9 g/dL (ref 3.5–5.0)
Alkaline Phosphatase: 62 U/L (ref 40–150)
Anion Gap: 8 meq/L (ref 3–11)
BUN: 13.8 mg/dL (ref 7.0–26.0)
CO2: 26 meq/L (ref 22–29)
Calcium: 9.2 mg/dL (ref 8.4–10.4)
Chloride: 107 meq/L (ref 98–109)
Creatinine: 1.4 mg/dL — ABNORMAL HIGH (ref 0.7–1.3)
Glucose: 95 mg/dL (ref 70–140)
Potassium: 4.2 meq/L (ref 3.5–5.1)
Sodium: 141 meq/L (ref 136–145)
Total Bilirubin: 0.54 mg/dL (ref 0.20–1.20)
Total Protein: 6.8 g/dL (ref 6.4–8.3)

## 2013-08-13 LAB — CBC WITH DIFFERENTIAL/PLATELET
BASO%: 0.3 % (ref 0.0–2.0)
Basophils Absolute: 0 10*3/uL (ref 0.0–0.1)
EOS%: 2.7 % (ref 0.0–7.0)
Eosinophils Absolute: 0.1 10*3/uL (ref 0.0–0.5)
HCT: 43.2 % (ref 38.4–49.9)
HGB: 15 g/dL (ref 13.0–17.1)
LYMPH%: 9.5 % — ABNORMAL LOW (ref 14.0–49.0)
MCH: 31.6 pg (ref 27.2–33.4)
MCHC: 34.7 g/dL (ref 32.0–36.0)
MCV: 91.1 fL (ref 79.3–98.0)
MONO#: 0.5 10*3/uL (ref 0.1–0.9)
MONO%: 12.7 % (ref 0.0–14.0)
NEUT#: 2.7 10*3/uL (ref 1.5–6.5)
NEUT%: 74.8 % (ref 39.0–75.0)
Platelets: 176 10*3/uL (ref 140–400)
RBC: 4.75 10*6/uL (ref 4.20–5.82)
RDW: 13 % (ref 11.0–14.6)
WBC: 3.6 10*3/uL — ABNORMAL LOW (ref 4.0–10.3)
lymph#: 0.3 10*3/uL — ABNORMAL LOW (ref 0.9–3.3)

## 2013-08-13 LAB — TSH CHCC: TSH: 4.045 m(IU)/L (ref 0.320–4.118)

## 2013-08-13 LAB — T4, FREE: Free T4: 1.46 ng/dL (ref 0.80–1.80)

## 2013-08-13 MED ORDER — IOHEXOL 300 MG/ML  SOLN
80.0000 mL | Freq: Once | INTRAMUSCULAR | Status: AC | PRN
  Administered 2013-08-13: 80 mL via INTRAVENOUS

## 2013-08-20 ENCOUNTER — Encounter: Payer: Self-pay | Admitting: Hematology and Oncology

## 2013-08-20 ENCOUNTER — Telehealth: Payer: Self-pay | Admitting: Hematology and Oncology

## 2013-08-20 ENCOUNTER — Other Ambulatory Visit: Payer: Self-pay | Admitting: *Deleted

## 2013-08-20 ENCOUNTER — Ambulatory Visit (HOSPITAL_BASED_OUTPATIENT_CLINIC_OR_DEPARTMENT_OTHER): Payer: BC Managed Care – PPO | Admitting: Hematology and Oncology

## 2013-08-20 VITALS — BP 136/72 | HR 65 | Temp 97.5°F | Resp 18 | Ht 70.0 in | Wt 182.0 lb

## 2013-08-20 DIAGNOSIS — E039 Hypothyroidism, unspecified: Secondary | ICD-10-CM

## 2013-08-20 DIAGNOSIS — C099 Malignant neoplasm of tonsil, unspecified: Secondary | ICD-10-CM

## 2013-08-20 MED ORDER — LEVOTHYROXINE SODIUM 50 MCG PO TABS: 50.0000 ug | ORAL_TABLET | Freq: Every day | ORAL | Status: DC

## 2013-08-20 NOTE — Telephone Encounter (Signed)
Gave pt appt for lab and md for September 2015 °

## 2013-08-20 NOTE — Progress Notes (Signed)
[facility] OFFICE PROGRESS NOTE  Patient Care Team: [name], MD as PCP - General (Family Medicine) Serena Colonel, MD as Attending Physician (Otolaryngology) Charlynne Pander, DDS (Dentistry) Jonna Coup, MD (Radiation Oncology) [name], MD as Consulting Physician (Family Medicine) Crecencio Mc, MD (Urology) Artis Delay, MD as Consulting Physician (Hematology and Oncology)  DIAGNOSIS: DIAGNOSIS:  1. 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 Right renal mass with positive PET scan; pending evaluation and workup.  2. Right renal cell carcinoma, clear cell subtype; grade III/IV; pT1b.    PAST THERAPY:  1. Due to start concurrent chemo radiation today 03/30/2012 with daily XRT and weekly Cisplatin.  2. Right nephrectomy on 06/29/2012.  INTERVAL HISTORY: Bobby Gonzalez 47 y.o. male returns for further followup. He had persistent dry mouth and altered taste sensation. Denies any dysphagia. Denies any palpable lymphadenopathy. His energy level is fair. He is gaining weight.  I have reviewed the past medical history, past surgical history, social history and family history with the patient and they are unchanged from previous note.  ALLERGIES:  is allergic to banana and penicillins.  MEDICATIONS:  Current Outpatient Prescriptions  Medication Sig Dispense Refill  . levothyroxine (SYNTHROID) 50 MCG tablet Take 1 tablet (50 mcg total) by mouth daily before breakfast.  30 tablet  5  . pilocarpine (SALAGEN) 5 MG tablet Take 1 tablet (5 mg total) by mouth 3 (three) times daily.  90 tablet  5   No current facility-administered medications for this visit.    REVIEW OF SYSTEMS:   Constitutional: Denies fevers, chills or abnormal weight loss Eyes: Denies blurriness of vision Respiratory: Denies cough, dyspnea or wheezes Cardiovascular: Denies palpitation, chest discomfort or lower extremity swelling Gastrointestinal:  Denies  nausea, heartburn or change in bowel habits Skin: Denies abnormal skin rashes Lymphatics: Denies new lymphadenopathy or easy bruising Neurological:Denies numbness, tingling or new weaknesses Behavioral/Psych: Mood is stable, no new changes  All other systems were reviewed with the patient and are negative.  PHYSICAL EXAMINATION: ECOG PERFORMANCE STATUS: 0 - Asymptomatic  Filed Vitals:   08/20/13 1015  BP: 136/72  Pulse: 65  Temp: 97.5 F (36.4 C)  Resp: 18   Filed Weights   08/20/13 1015  Weight: 182 lb (82.555 kg)    GENERAL:alert, no distress and comfortable SKIN: skin color, texture, turgor are normal, no rashes or significant lesions EYES: normal, Conjunctiva are pink and non-injected, sclera clear OROPHARYNX:no exudate, no erythema and lips, buccal mucosa, and tongue normal  NECK: supple, thyroid normal size, non-tender, without nodularity LYMPH:  no palpable lymphadenopathy in the cervical, axillary or inguinal LUNGS: clear to auscultation and percussion with normal breathing effort HEART: regular rate & rhythm and no murmurs and no lower extremity edema ABDOMEN:abdomen soft, non-tender and normal bowel sounds Musculoskeletal:no cyanosis of digits and no clubbing  NEURO: alert & oriented x 3 with fluent speech, no focal motor/sensory deficits  LABORATORY DATA:  I have reviewed the data as listed    Component Value Date/Time   NA 141 08/13/2013 0844   NA 139 10/30/2012 1010   K 4.2 08/13/2013 0844   K 4.5 10/30/2012 1010   CL 106 02/12/2013 1414   CL 103 10/30/2012 1010   CO2 26 08/13/2013 0844   CO2 28 10/30/2012 1010   GLUCOSE 95 08/13/2013 0844   GLUCOSE 119* 02/12/2013 1414   GLUCOSE 89 10/30/2012 1010   BUN 13.8 08/13/2013 0844   BUN 15 10/30/2012  1010   CREATININE 1.4* 08/13/2013 0844   CREATININE 1.32 10/30/2012 1010   CALCIUM 9.2 08/13/2013 0844   CALCIUM 9.5 10/30/2012 1010   PROT 6.8 08/13/2013 0844   PROT 6.7 06/25/2012 1500   ALBUMIN 3.9 08/13/2013  0844   ALBUMIN 3.8 06/25/2012 1500   AST 17 08/13/2013 0844   AST 12 06/25/2012 1500   ALT 15 08/13/2013 0844   ALT 10 06/25/2012 1500   ALKPHOS 62 08/13/2013 0844   ALKPHOS 56 06/25/2012 1500   BILITOT 0.54 08/13/2013 0844   BILITOT 0.4 06/25/2012 1500   GFRNONAA 63* 10/30/2012 1010   GFRAA 73* 10/30/2012 1010    No results found for this basename: SPEP,  UPEP,   kappa and lambda light chains    Lab Results  Component Value Date   WBC 3.6* 08/13/2013   NEUTROABS 2.7 08/13/2013   HGB 15.0 08/13/2013   HCT 43.2 08/13/2013   MCV 91.1 08/13/2013   PLT 176 08/13/2013      Chemistry      Component Value Date/Time   NA 141 08/13/2013 0844   NA 139 10/30/2012 1010   K 4.2 08/13/2013 0844   K 4.5 10/30/2012 1010   CL 106 02/12/2013 1414   CL 103 10/30/2012 1010   CO2 26 08/13/2013 0844   CO2 28 10/30/2012 1010   BUN 13.8 08/13/2013 0844   BUN 15 10/30/2012 1010   CREATININE 1.4* 08/13/2013 0844   CREATININE 1.32 10/30/2012 1010      Component Value Date/Time   CALCIUM 9.2 08/13/2013 0844   CALCIUM 9.5 10/30/2012 1010   ALKPHOS 62 08/13/2013 0844   ALKPHOS 56 06/25/2012 1500   AST 17 08/13/2013 0844   AST 12 06/25/2012 1500   ALT 15 08/13/2013 0844   ALT 10 06/25/2012 1500   BILITOT 0.54 08/13/2013 0844   BILITOT 0.4 06/25/2012 1500      ASSESSMENT & PLAN:  #1 squamous cell carcinoma of the left tonsil The patient completed treatment by September 2013. I recommend regular followup with history, physical examination, blood work and periodic imaging study up until September 2015. His recent laryngoscopy in September 2014 was negative. His recent CT scan in June 2014 was normal. I am repeating another CT scan of the neck and chest in September 2015 and if that is normal, we can discontinue further imaging study #2 history of kidney cancer The patient is due for repeat imaging study. I would defer to the urologist to order the specific test. #3 dry mouth He will continue to  use pilocarpine #4 hypothyroidism His most recent thyroid function tests were normal. No dosage adjustment is required.  Orders Placed This Encounter  Procedures  . CT Chest W Contrast    Standing Status: Future     Number of Occurrences:      Standing Expiration Date: 10/20/2014    Order Specific Question:  Reason for Exam (SYMPTOM  OR DIAGNOSIS REQUIRED)    Answer:  Hx tonsil ca, r/o recurrence    Order Specific Question:  Preferred imaging location?    Answer:  [facility]  . CT Soft Tissue Neck W Contrast    Standing Status: Future     Number of Occurrences:      Standing Expiration Date: 11/20/2014    Order Specific Question:  Reason for Exam (SYMPTOM  OR DIAGNOSIS REQUIRED)    Answer:  Hx tonsil ca, r/o recurrence    Order Specific Question:  Preferred imaging location?  Answer:  [facility]  . CBC with Differential    Standing Status: Future     Number of Occurrences:      Standing Expiration Date: 05/12/2014  . Comprehensive metabolic panel    Standing Status: Future     Number of Occurrences:      Standing Expiration Date: 08/20/2014  . T4, free    Standing Status: Future     Number of Occurrences:      Standing Expiration Date: 08/20/2014  . TSH    Standing Status: Future     Number of Occurrences:      Standing Expiration Date: 08/20/2014   All questions were answered. The patient knows to call the clinic with any problems, questions or concerns. No barriers to learning was detected.     Becky Colan, MD 08/20/2013 11:34 AM

## 2013-09-16 ENCOUNTER — Ambulatory Visit (HOSPITAL_COMMUNITY)
Admission: RE | Admit: 2013-09-16 | Discharge: 2013-09-16 | Disposition: A | Discharge status: Home / Self Care | Payer: BC Managed Care – PPO | Source: Ambulatory Visit | Attending: Urology | Admitting: Urology

## 2013-09-16 ENCOUNTER — Other Ambulatory Visit (HOSPITAL_COMMUNITY): Payer: Self-pay | Admitting: Urology

## 2013-09-16 DIAGNOSIS — Z85528 Personal history of other malignant neoplasm of kidney: Secondary | ICD-10-CM

## 2013-09-16 DIAGNOSIS — C649 Malignant neoplasm of unspecified kidney, except renal pelvis: Secondary | ICD-10-CM | POA: Insufficient documentation

## 2013-09-16 DIAGNOSIS — C099 Malignant neoplasm of tonsil, unspecified: Secondary | ICD-10-CM | POA: Insufficient documentation

## 2013-09-24 ENCOUNTER — Other Ambulatory Visit: Payer: Self-pay | Admitting: *Deleted

## 2013-09-24 DIAGNOSIS — C099 Malignant neoplasm of tonsil, unspecified: Secondary | ICD-10-CM

## 2013-09-24 DIAGNOSIS — K117 Disturbances of salivary secretion: Secondary | ICD-10-CM

## 2013-09-24 DIAGNOSIS — R682 Dry mouth, unspecified: Secondary | ICD-10-CM

## 2013-09-24 MED ORDER — PILOCARPINE HCL 5 MG PO TABS: 5.0000 mg | ORAL_TABLET | Freq: Three times a day (TID) | ORAL | Status: DC

## 2013-12-29 ENCOUNTER — Telehealth: Payer: Self-pay | Admitting: *Deleted

## 2013-12-29 NOTE — Telephone Encounter (Signed)
Pt asks when he is suppose to f/u w/ Dr. Alvy Bimler again?  He saw PCP this morning who adjusted his thyroid medication.   Instructed pt to to continue to have PCP manage his thyroid medication as this will be a lifelong medication.  Informed that dr. Alvy Bimler ordered CT scan and next f/u visit for Sept this year.  Appts have not been made yet but will be made before Sept.. He verbalized understanding.  States he is doing well.

## 2014-01-13 ENCOUNTER — Encounter: Payer: Self-pay | Admitting: Radiation Oncology

## 2014-01-13 ENCOUNTER — Ambulatory Visit
Admission: RE | Admit: 2014-01-13 | Discharge: 2014-01-13 | Disposition: A | Discharge status: Home / Self Care | Payer: BC Managed Care – PPO | Source: Ambulatory Visit | Attending: Radiation Oncology | Admitting: Radiation Oncology

## 2014-01-13 VITALS — BP 126/76 | HR 66 | Temp 98.7°F | Ht 70.0 in | Wt 185.6 lb

## 2014-01-13 DIAGNOSIS — C099 Malignant neoplasm of tonsil, unspecified: Secondary | ICD-10-CM

## 2014-01-13 DIAGNOSIS — Z85528 Personal history of other malignant neoplasm of kidney: Secondary | ICD-10-CM | POA: Insufficient documentation

## 2014-01-13 DIAGNOSIS — Z79899 Other long term (current) drug therapy: Secondary | ICD-10-CM | POA: Insufficient documentation

## 2014-01-13 MED ORDER — LARYNGOSCOPY SOLUTION RAD-ONC
15.0000 mL | Freq: Once | TOPICAL | Status: AC
  Administered 2014-01-13: 15 mL via TOPICAL

## 2014-01-13 NOTE — Progress Notes (Signed)
Mr. Foot here today for reassessment s/p radiation to the left tonsil.  He had a recent increase in his Synthroid on the 1st of may for and increase in his Thyroid level by his PC.  He states that he continues to have to monitor the texture of foods that are thick and dry such as pastries and breads.   Teeth cleaned 1 month ago and continues on fluoride toothpaste.  No other voiced concerns.

## 2014-01-13 NOTE — Progress Notes (Signed)
  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  The patient states he is doing relatively well since he was last seen. Some ongoing change in taste and xerostomia. He also states that swallowing is sometimes difficult especially with food such as bread and stay. He carries around water bottle which helps quite a bit. He has been hypothyroid and is dose of Synthroid was increased recently by his primary care physician.                              ALLERGIES:  is allergic to banana and penicillins.  Meds: Current Outpatient Prescriptions  Medication Sig Dispense Refill  . levothyroxine (SYNTHROID, LEVOTHROID) 75 MCG tablet Take 75 mcg by mouth daily before breakfast.      . pilocarpine (SALAGEN) 5 MG tablet Take 1 tablet (5 mg total) by mouth 3 (three) times daily.  90 tablet  7   No current facility-administered medications for this encounter.    Physical Findings: The patient is in no acute distress. Patient is alert and oriented.  height is 5\' 10"  (1.778 m) and weight is 185 lb 9.6 oz (84.188 kg). His temperature is 98.7 F (37.1 C). His blood pressure is 126/76 and his pulse is 66. Marland Kitchen   Neck supple daily lymphadenopathy Oral cavity clear Fiberoptic exam: After the use of topical anesthetic, the flexible laryngoscope was passed through the right near. Good visualization was obtained. No lesions or suspicious findings within the larynx, hypopharynx, oropharynx or nasopharynx.   Lab Findings: Lab Results  Component Value Date   WBC 3.6* 08/13/2013   HGB 15.0 08/13/2013   HCT 43.2  08/13/2013   MCV 91.1 08/13/2013   PLT 176 08/13/2013     Radiographic Findings: No results found.  Impression:    The patient is doing well at this time. He has had some degree of improvement in complaints such as change in taste and xerostomia but this has been slow. No concerns on clinical exam today.  Plan:  The patient will followup in our clinic in 6 months.   Jodelle Gross, M.D., Ph.D.

## 2014-03-09 ENCOUNTER — Ambulatory Visit (HOSPITAL_COMMUNITY)
Admission: RE | Admit: 2014-03-09 | Discharge: 2014-03-09 | Disposition: A | Discharge status: Home / Self Care | Payer: BC Managed Care – PPO | Source: Ambulatory Visit | Attending: Urology | Admitting: Urology

## 2014-03-09 ENCOUNTER — Other Ambulatory Visit (HOSPITAL_COMMUNITY): Payer: Self-pay | Admitting: Urology

## 2014-03-09 DIAGNOSIS — C649 Malignant neoplasm of unspecified kidney, except renal pelvis: Secondary | ICD-10-CM

## 2014-03-09 DIAGNOSIS — Z85819 Personal history of malignant neoplasm of unspecified site of lip, oral cavity, and pharynx: Secondary | ICD-10-CM | POA: Insufficient documentation

## 2014-05-10 ENCOUNTER — Telehealth: Payer: Self-pay | Admitting: *Deleted

## 2014-05-10 NOTE — Telephone Encounter (Signed)
Pt left VM states he has CT Neck/Chest scheduled this Friday 9/11.   He says his urologist, Dr. Alinda Money, wanted a "whole body scan" done and pt asks if Dr. Alvy Bimler can order this?    Called pt back and Left him a message informing that if Dr. Alinda Money wants a scan to look at pt's kidneys, then he can add CT abd/pelvis or possibly Dr. Alvy Bimler may add this if we know the reason.  Asked pt to call nurse back tomorrow.

## 2014-05-11 ENCOUNTER — Other Ambulatory Visit: Payer: Self-pay | Admitting: Hematology and Oncology

## 2014-05-11 ENCOUNTER — Telehealth: Payer: Self-pay | Admitting: *Deleted

## 2014-05-11 DIAGNOSIS — C649 Malignant neoplasm of unspecified kidney, except renal pelvis: Secondary | ICD-10-CM

## 2014-05-11 NOTE — Telephone Encounter (Signed)
I ordered CT abd/pel Not sure if it can be added though due to time constraints. Please check w CT from [facility] and precert

## 2014-05-11 NOTE — Telephone Encounter (Signed)
Informed pt of CT abd/pelvis added on for Friday.   Instructed him to pick up contrast bottles, left at front desk for him to p/u w/ instructions.  He verbalized understanding.

## 2014-05-13 ENCOUNTER — Ambulatory Visit (HOSPITAL_COMMUNITY)
Admission: RE | Admit: 2014-05-13 | Discharge: 2014-05-13 | Disposition: A | Discharge status: Home / Self Care | Payer: BC Managed Care – PPO | Source: Ambulatory Visit | Attending: Hematology and Oncology | Admitting: Hematology and Oncology

## 2014-05-13 ENCOUNTER — Other Ambulatory Visit (HOSPITAL_BASED_OUTPATIENT_CLINIC_OR_DEPARTMENT_OTHER): Payer: BC Managed Care – PPO

## 2014-05-13 ENCOUNTER — Encounter (HOSPITAL_COMMUNITY): Payer: Self-pay

## 2014-05-13 ENCOUNTER — Other Ambulatory Visit: Payer: Self-pay | Admitting: *Deleted

## 2014-05-13 DIAGNOSIS — C099 Malignant neoplasm of tonsil, unspecified: Secondary | ICD-10-CM

## 2014-05-13 DIAGNOSIS — Z9889 Other specified postprocedural states: Secondary | ICD-10-CM | POA: Insufficient documentation

## 2014-05-13 DIAGNOSIS — C649 Malignant neoplasm of unspecified kidney, except renal pelvis: Secondary | ICD-10-CM | POA: Insufficient documentation

## 2014-05-13 DIAGNOSIS — E039 Hypothyroidism, unspecified: Secondary | ICD-10-CM

## 2014-05-13 LAB — COMPREHENSIVE METABOLIC PANEL (CC13)
ALT: 23 U/L (ref 0–55)
AST: 19 U/L (ref 5–34)
Albumin: 4 g/dL (ref 3.5–5.0)
Alkaline Phosphatase: 60 U/L (ref 40–150)
Anion Gap: 10 mEq/L (ref 3–11)
BUN: 21.7 mg/dL (ref 7.0–26.0)
CO2: 24 mEq/L (ref 22–29)
Calcium: 9.5 mg/dL (ref 8.4–10.4)
Chloride: 106 mEq/L (ref 98–109)
Creatinine: 1.4 mg/dL — ABNORMAL HIGH (ref 0.7–1.3)
Glucose: 107 mg/dl (ref 70–140)
Potassium: 4.3 mEq/L (ref 3.5–5.1)
Sodium: 140 mEq/L (ref 136–145)
Total Bilirubin: 0.57 mg/dL (ref 0.20–1.20)
Total Protein: 7.1 g/dL (ref 6.4–8.3)

## 2014-05-13 LAB — CBC WITH DIFFERENTIAL/PLATELET
BASO%: 0.2 % (ref 0.0–2.0)
Basophils Absolute: 0 10*3/uL (ref 0.0–0.1)
EOS%: 4.2 % (ref 0.0–7.0)
Eosinophils Absolute: 0.2 10*3/uL (ref 0.0–0.5)
HCT: 47.5 % (ref 38.4–49.9)
HGB: 15.9 g/dL (ref 13.0–17.1)
LYMPH%: 11.3 % — ABNORMAL LOW (ref 14.0–49.0)
MCH: 30.3 pg (ref 27.2–33.4)
MCHC: 33.4 g/dL (ref 32.0–36.0)
MCV: 90.8 fL (ref 79.3–98.0)
MONO#: 0.6 10*3/uL (ref 0.1–0.9)
MONO%: 12.5 % (ref 0.0–14.0)
NEUT#: 3.4 10*3/uL (ref 1.5–6.5)
NEUT%: 71.8 % (ref 39.0–75.0)
Platelets: 192 10*3/uL (ref 140–400)
RBC: 5.23 10*6/uL (ref 4.20–5.82)
RDW: 13.2 % (ref 11.0–14.6)
WBC: 4.7 10*3/uL (ref 4.0–10.3)
lymph#: 0.5 10*3/uL — ABNORMAL LOW (ref 0.9–3.3)

## 2014-05-13 LAB — T4, FREE: Free T4: 1.29 ng/dL (ref 0.80–1.80)

## 2014-05-13 LAB — TSH CHCC: TSH: 3.937 m(IU)/L (ref 0.320–4.118)

## 2014-05-13 MED ORDER — IOHEXOL 300 MG/ML  SOLN
100.0000 mL | Freq: Once | INTRAMUSCULAR | Status: AC | PRN
  Administered 2014-05-13: 100 mL via INTRAVENOUS

## 2014-05-16 ENCOUNTER — Telehealth: Payer: Self-pay | Admitting: Hematology and Oncology

## 2014-05-16 ENCOUNTER — Encounter: Payer: Self-pay | Admitting: Hematology and Oncology

## 2014-05-16 ENCOUNTER — Ambulatory Visit (HOSPITAL_BASED_OUTPATIENT_CLINIC_OR_DEPARTMENT_OTHER): Payer: BC Managed Care – PPO | Admitting: Hematology and Oncology

## 2014-05-16 VITALS — BP 134/83 | HR 73 | Temp 98.3°F | Resp 19 | Ht 70.0 in | Wt 191.2 lb

## 2014-05-16 DIAGNOSIS — C641 Malignant neoplasm of right kidney, except renal pelvis: Secondary | ICD-10-CM

## 2014-05-16 DIAGNOSIS — Z0000 Encounter for general adult medical examination without abnormal findings: Secondary | ICD-10-CM

## 2014-05-16 DIAGNOSIS — Z85819 Personal history of malignant neoplasm of unspecified site of lip, oral cavity, and pharynx: Secondary | ICD-10-CM

## 2014-05-16 DIAGNOSIS — Z23 Encounter for immunization: Secondary | ICD-10-CM

## 2014-05-16 DIAGNOSIS — E038 Other specified hypothyroidism: Secondary | ICD-10-CM

## 2014-05-16 DIAGNOSIS — R918 Other nonspecific abnormal finding of lung field: Secondary | ICD-10-CM

## 2014-05-16 DIAGNOSIS — Z299 Encounter for prophylactic measures, unspecified: Secondary | ICD-10-CM

## 2014-05-16 DIAGNOSIS — R682 Dry mouth, unspecified: Secondary | ICD-10-CM

## 2014-05-16 DIAGNOSIS — E039 Hypothyroidism, unspecified: Secondary | ICD-10-CM

## 2014-05-16 DIAGNOSIS — C099 Malignant neoplasm of tonsil, unspecified: Secondary | ICD-10-CM

## 2014-05-16 DIAGNOSIS — K117 Disturbances of salivary secretion: Secondary | ICD-10-CM

## 2014-05-16 DIAGNOSIS — Z8553 Personal history of malignant neoplasm of renal pelvis: Secondary | ICD-10-CM

## 2014-05-16 HISTORY — DX: Dry mouth, unspecified: R68.2

## 2014-05-16 HISTORY — DX: Encounter for general adult medical examination without abnormal findings: Z00.00

## 2014-05-16 HISTORY — DX: Other nonspecific abnormal finding of lung field: R91.8

## 2014-05-16 HISTORY — DX: Encounter for prophylactic measures, unspecified: Z29.9

## 2014-05-16 MED ORDER — PILOCARPINE HCL 5 MG PO TABS: 5.0000 mg | ORAL_TABLET | Freq: Three times a day (TID) | ORAL | Status: DC

## 2014-05-16 MED ORDER — INFLUENZA VAC SPLIT QUAD 0.5 ML IM SUSY
0.5000 mL | PREFILLED_SYRINGE | Freq: Once | INTRAMUSCULAR | Status: AC
  Administered 2014-05-16: 0.5 mL via INTRAMUSCULAR
  Filled 2014-05-16: qty 0.5

## 2014-05-16 NOTE — Assessment & Plan Note (Signed)
We discussed the importance of preventive care and reviewed the vaccination programs. He does not have any prior allergic reactions to influenza vaccination. He agrees to proceed with influenza vaccination today and we will administer it today at the clinic.  

## 2014-05-16 NOTE — Assessment & Plan Note (Signed)
Repeat thyroid function tests is adequate. He'll continue the same dose of thyroid replacement therapy.

## 2014-05-16 NOTE — Assessment & Plan Note (Signed)
This is due to prior radiation exposure. I refilled his prescription of pilocarpine.

## 2014-05-16 NOTE — Assessment & Plan Note (Signed)
This is likely benign. I will order repeat chest CT in one year.

## 2014-05-16 NOTE — Assessment & Plan Note (Signed)
Clinically, he has no signs of recurrence. I recommend imaging study again in one year. In the meantime, recommend he continue followup with ear, nose and throat surgeon for surveillance endoscopy.

## 2014-05-16 NOTE — Telephone Encounter (Signed)
Pt confirmed labs/ov per 09/14 POF, gave pt AVS, pt will p/u barium closer to time of CT scan.Marland Kitchen..KJ

## 2014-05-16 NOTE — Progress Notes (Signed)
[facility] OFFICE PROGRESS NOTE  Patient Care Team: Shirline Frees, MD as PCP - General (Family Medicine) [name], MD as Attending Physician (Otolaryngology) Lenn Cal, DDS (Dentistry) Marye Round, MD (Radiation Oncology) Shirline Frees, MD as Consulting Physician (Family Medicine) Raynelle Bring, MD (Urology) Heath Lark, MD as Consulting Physician (Hematology and Oncology)  SUMMARY OF ONCOLOGIC HISTORY: 1. 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 Right renal mass with positive PET scan; pending evaluation and workup.  2. Right renal cell carcinoma, clear cell subtype; grade III/IV; pT1b.    PAST THERAPY:  1. Due to start concurrent chemo radiation today 03/30/2012 with daily XRT and weekly Cisplatin.  2. Right nephrectomy on 06/29/2012.   INTERVAL HISTORY: Please see below for problem oriented charting. He feels well. Denies any new lump in his neck. He has persistent dry mouth. Denies dysphagia. Denies recent hematuria.  REVIEW OF SYSTEMS:   Constitutional: Denies fevers, chills or abnormal weight loss Eyes: Denies blurriness of vision Ears, nose, mouth, throat, and face: Denies mucositis or sore throat Respiratory: Denies cough, dyspnea or wheezes Cardiovascular: Denies palpitation, chest discomfort or lower extremity swelling Gastrointestinal:  Denies nausea, heartburn or change in bowel habits Skin: Denies abnormal skin rashes Lymphatics: Denies new lymphadenopathy or easy bruising Neurological:Denies numbness, tingling or new weaknesses Behavioral/Psych: Mood is stable, no new changes  All other systems were reviewed with the patient and are negative.  I have reviewed the past medical history, past surgical history, social history and family history with the patient and they are unchanged from previous note.  ALLERGIES:  is allergic to banana and penicillins.  MEDICATIONS:  Current Outpatient  Prescriptions  Medication Sig Dispense Refill  . levothyroxine (SYNTHROID, LEVOTHROID) 75 MCG tablet Take 75 mcg by mouth daily before breakfast.      . pilocarpine (SALAGEN) 5 MG tablet Take 1 tablet (5 mg total) by mouth 3 (three) times daily.  90 tablet  7   No current facility-administered medications for this visit.    PHYSICAL EXAMINATION: ECOG PERFORMANCE STATUS: 0 - Asymptomatic  Filed Vitals:   05/16/14 1407  BP: 134/83  Pulse: 73  Temp: 98.3 F (36.8 C)  Resp: 19   Filed Weights   05/16/14 1407  Weight: 191 lb 3.2 oz (86.728 kg)    GENERAL:alert, no distress and comfortable SKIN: skin color, texture, turgor are normal, no rashes or significant lesions EYES: normal, Conjunctiva are pink and non-injected, sclera clear OROPHARYNX:no exudate, no erythema and lips, buccal mucosa, and tongue normal  NECK: Neck is fibrosis from prior radiation.  LYMPH:  no palpable lymphadenopathy in the cervical, axillary or inguinal LUNGS: clear to auscultation and percussion with normal breathing effort HEART: regular rate & rhythm and no murmurs and no lower extremity edema ABDOMEN:abdomen soft, non-tender and normal bowel sounds Musculoskeletal:no cyanosis of digits and no clubbing  NEURO: alert & oriented x 3 with fluent speech, no focal motor/sensory deficits  LABORATORY DATA:  I have reviewed the data as listed    Component Value Date/Time   NA 140 05/13/2014 0814   NA 139 10/30/2012 1010   K 4.3 05/13/2014 0814   K 4.5 10/30/2012 1010   CL 106 02/12/2013 1414   CL 103 10/30/2012 1010   CO2 24 05/13/2014 0814   CO2 28 10/30/2012 1010   GLUCOSE 107 05/13/2014 0814   GLUCOSE 119* 02/12/2013 1414   GLUCOSE 89 10/30/2012 1010   BUN 21.7 05/13/2014 0814  BUN 15 10/30/2012 1010   CREATININE 1.4* 05/13/2014 0814   CREATININE 1.32 10/30/2012 1010   CALCIUM 9.5 05/13/2014 0814   CALCIUM 9.5 10/30/2012 1010   PROT 7.1 05/13/2014 0814   PROT 6.7 06/25/2012 1500   ALBUMIN 4.0 05/13/2014 0814    ALBUMIN 3.8 06/25/2012 1500   AST 19 05/13/2014 0814   AST 12 06/25/2012 1500   ALT 23 05/13/2014 0814   ALT 10 06/25/2012 1500   ALKPHOS 60 05/13/2014 0814   ALKPHOS 56 06/25/2012 1500   BILITOT 0.57 05/13/2014 0814   BILITOT 0.4 06/25/2012 1500   GFRNONAA 63* 10/30/2012 1010   GFRAA 73* 10/30/2012 1010    No results found for this basename: SPEP,  UPEP,   kappa and lambda light chains    Lab Results  Component Value Date   WBC 4.7 05/13/2014   NEUTROABS 3.4 05/13/2014   HGB 15.9 05/13/2014   HCT 47.5 05/13/2014   MCV 90.8 05/13/2014   PLT 192 05/13/2014      Chemistry      Component Value Date/Time   NA 140 05/13/2014 0814   NA 139 10/30/2012 1010   K 4.3 05/13/2014 0814   K 4.5 10/30/2012 1010   CL 106 02/12/2013 1414   CL 103 10/30/2012 1010   CO2 24 05/13/2014 0814   CO2 28 10/30/2012 1010   BUN 21.7 05/13/2014 0814   BUN 15 10/30/2012 1010   CREATININE 1.4* 05/13/2014 0814   CREATININE 1.32 10/30/2012 1010      Component Value Date/Time   CALCIUM 9.5 05/13/2014 0814   CALCIUM 9.5 10/30/2012 1010   ALKPHOS 60 05/13/2014 0814   ALKPHOS 56 06/25/2012 1500   AST 19 05/13/2014 0814   AST 12 06/25/2012 1500   ALT 23 05/13/2014 0814   ALT 10 06/25/2012 1500   BILITOT 0.57 05/13/2014 0814   BILITOT 0.4 06/25/2012 1500       RADIOGRAPHIC STUDIES: I reviewed all the imaging study with him and his wife. I have personally reviewed the radiological images as listed and agreed with the findings in the report.   ASSESSMENT & PLAN:  Tonsil cancer Clinically, he has no signs of recurrence. I recommend imaging study again in one year. In the meantime, recommend he continue followup with ear, nose and throat surgeon for surveillance endoscopy.  Renal cell carcinoma Clinically, he has no signs of recurrence. CT scan of the abdomen and pelvis is negative. Recommend observation only.  Hypothyroid Repeat thyroid function tests is adequate. He'll continue the same dose of thyroid replacement  therapy.  Multiple lung nodules on CT This is likely benign. I will order repeat chest CT in one year.  Dry mouth This is due to prior radiation exposure. I refilled his prescription of pilocarpine.   Preventive measure We discussed the importance of preventive care and reviewed the vaccination programs. He does not have any prior allergic reactions to influenza vaccination. He agrees to proceed with influenza vaccination today and we will administer it today at the clinic.     Orders Placed This Encounter  Procedures  . CT Chest W Contrast    Standing Status: Future     Number of Occurrences:      Standing Expiration Date: 08/16/2015    Order Specific Question:  Reason for Exam (SYMPTOM  OR DIAGNOSIS REQUIRED)    Answer:  lung nodules, tonsil cancer and kidney ca    Order Specific Question:  Preferred imaging location?    Answer:  [facility]  . CT Abdomen Pelvis W Contrast    Standing Status: Future     Number of Occurrences:      Standing Expiration Date: 08/16/2015    Order Specific Question:  Reason for Exam (SYMPTOM  OR DIAGNOSIS REQUIRED)    Answer:  renal cell carcinoma, exclude recurrence    Order Specific Question:  Preferred imaging location?    Answer:  [facility]  . CT Soft Tissue Neck W Contrast    Standing Status: Future     Number of Occurrences:      Standing Expiration Date: 08/16/2015    Order Specific Question:  Reason for Exam (SYMPTOM  OR DIAGNOSIS REQUIRED)    Answer:  tonsil ca, exclude reccurence    Order Specific Question:  Preferred imaging location?    Answer:  [facility]  . CBC with Differential    Standing Status: Future     Number of Occurrences:      Standing Expiration Date: 08/16/2015  . Comprehensive metabolic panel    Standing Status: Future     Number of Occurrences:      Standing Expiration Date: 08/16/2015  . TSH    Standing Status: Future     Number of Occurrences:      Standing Expiration Date:  08/16/2015   All questions were answered. The patient knows to call the clinic with any problems, questions or concerns. No barriers to learning was detected. I spent 30 minutes counseling the patient face to face. The total time spent in the appointment was 40 minutes and more than 50% was on counseling and review of test results     [facility], [place], MD 05/16/2014 3:06 PM

## 2014-05-16 NOTE — Assessment & Plan Note (Signed)
Clinically, he has no signs of recurrence. CT scan of the abdomen and pelvis is negative. Recommend observation only.

## 2014-06-09 IMAGING — PT NM PET TUM IMG RESTAG (PS) SKULL BASE T - THIGH
4 series · 25 of 25 positions shown · non-contrast
Comparison: CT chest abdomen 06/16/2012, CT abdomen pelvis
03/10/2012, PET 02/26/2012 and CT neck 02/06/2012.

CLINICAL DATA: Subsequent treatment strategy for tonsillar cancer.

NUCLEAR MEDICINE PET SKULL BASE TO THIGH
Fasting Blood Glucose:  84
TECHNIQUE: 17.6 mCi F-18 FDG was injected intravenously. CT data
was obtained and used for attenuation correction and anatomic
localization only.  (This was not acquired as a diagnostic CT
examination.) Additional exam technical data entered on
technologist worksheet.

[Series 2: ct images · axial · 3.8mm · 0.98mm/px · z∈[-294,+0]mm · 8 of 91 slices shown]
[im 1/91]
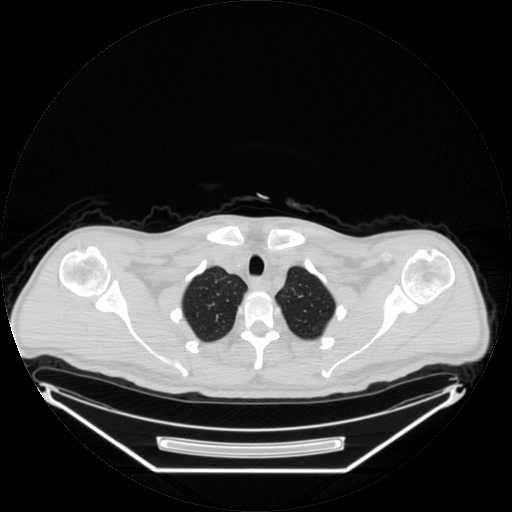
[im 13/91]
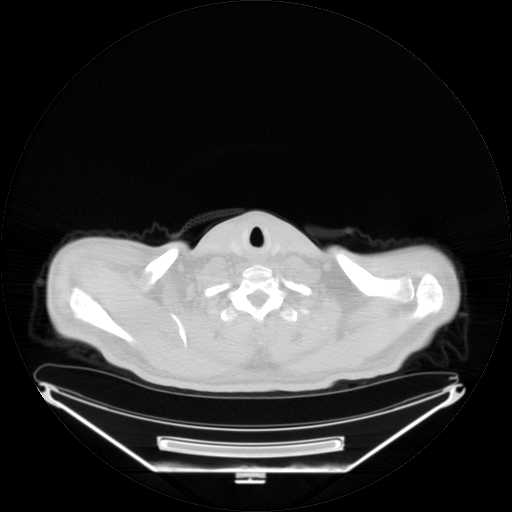
[im 26/91]
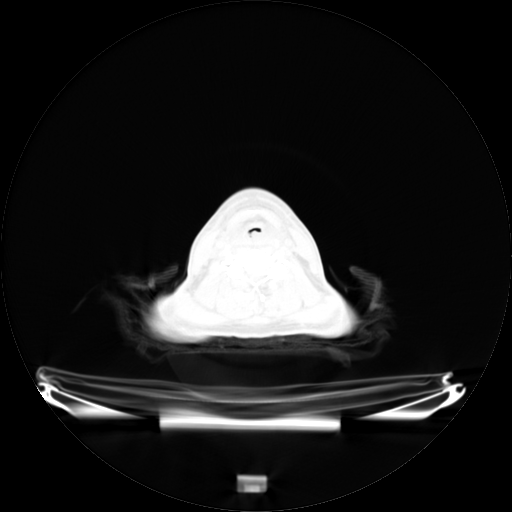
[im 39/91]
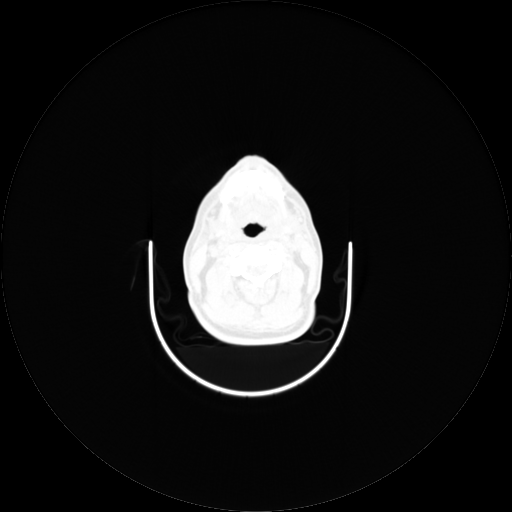
[im 52/91]
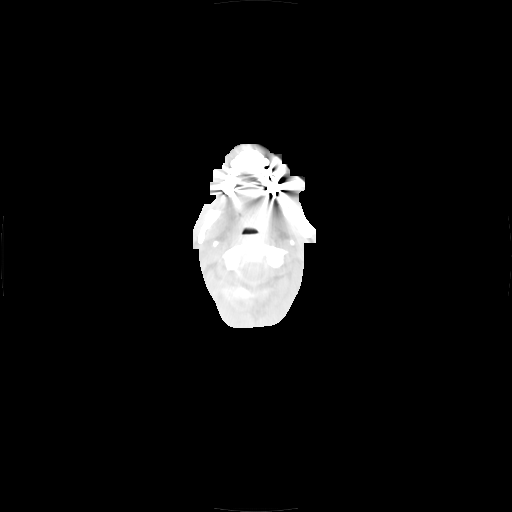
[im 65/91]
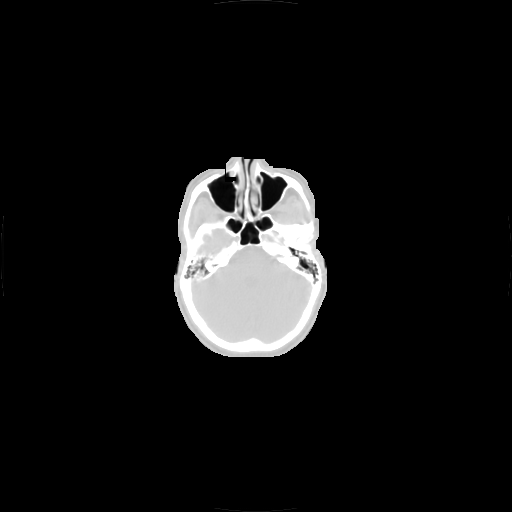
[im 78/91  brain]
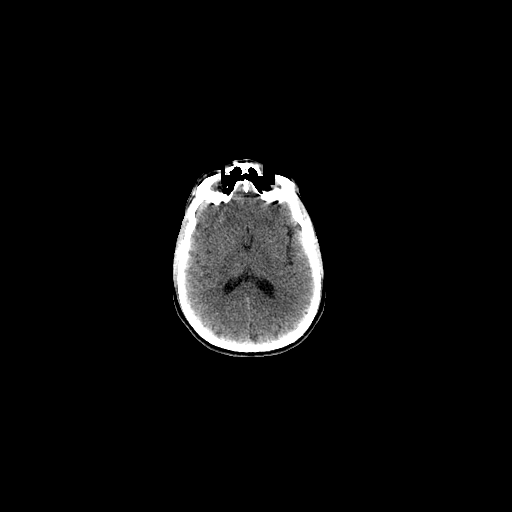
[im 91/91]
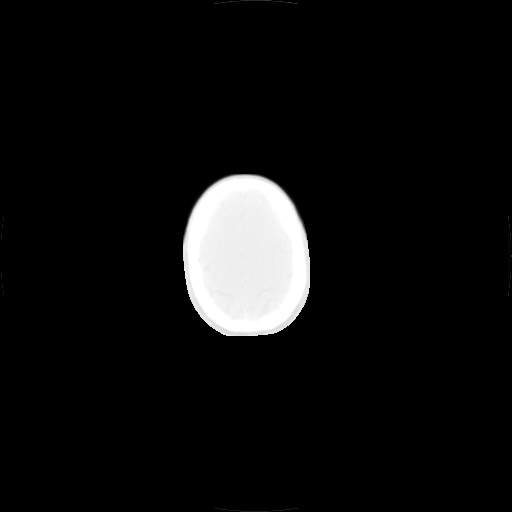

[Series 123: mip · coronal · 3.3mm · 4.69mm/px · 3 of 30 slices shown]
[im 1/30]
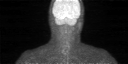
[im 15/30]
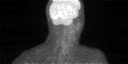
[im 30/30]
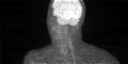

[Series 151: reformatted · coronal · 4.7mm · 4.69mm/px · 6 of 67 slices shown (1 of 2)]
[im 1/67]
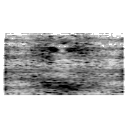
[im 14/67]
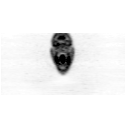
[im 27/67]
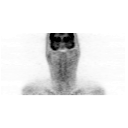
[im 40/67]
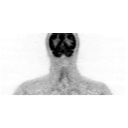
[im 53/67]
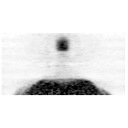
[im 67/67]
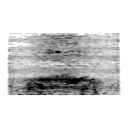

[Series 153: reformatted · axial · 3.3mm · 3.91mm/px · z∈[-294,+0]mm · 8 of 91 slices shown (2 of 2)]
[im 1/91]
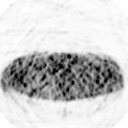
[im 13/91]
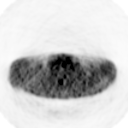
[im 26/91]
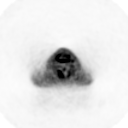
[im 39/91]
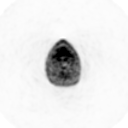
[im 52/91]
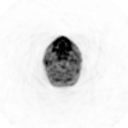
[im 65/91]
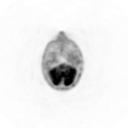
[im 78/91]
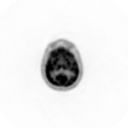
[im 91/91]
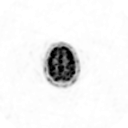

[25 of 25 positions shown; findings below may reference images not displayed]

FINDINGS: Neck: Mild asymmetric right parotid hypermetabolism has an S U V
max of 4.7.  No additional areas of abnormal hypermetabolism in the
neck.  Mild asymmetric paraspinal musculature hypermetabolism is
likely physiologic.  CT images show no acute findings.

Chest:  No hypermetabolic mediastinal hilar lymph nodes.  CT
findings show no worrisome pulmonary nodules, pericardial effusion
or pleural effusion.

Abdomen/Pelvis:  No abnormal hypermetabolic activity within the
liver, pancreas, adrenal glands, or spleen.  No hypermetabolic
lymph nodes in the abdomen or pelvis.  CT images show no acute
findings.  Percutaneous gastrostomy is in place.  Postoperative
changes of right nephrectomy.  No free fluid.

Skeleton:  No focal hypermetabolic activity to suggest skeletal
metastasis.
IMPRESSION: 1.  No evidence of residual, recurrent or metastatic disease.
2.  Asymmetric right parotid activity is likely infectious or
inflammatory in etiology.

## 2014-07-21 ENCOUNTER — Ambulatory Visit: Payer: BC Managed Care – PPO | Admitting: Radiation Oncology

## 2014-08-04 ENCOUNTER — Encounter: Payer: Self-pay | Admitting: Radiation Oncology

## 2014-08-04 ENCOUNTER — Ambulatory Visit
Admission: RE | Admit: 2014-08-04 | Discharge: 2014-08-04 | Disposition: A | Discharge status: Home / Self Care | Payer: 59 | Source: Ambulatory Visit | Attending: Radiation Oncology | Admitting: Radiation Oncology

## 2014-08-04 VITALS — BP 120/78 | HR 62 | Temp 98.2°F | Resp 12 | Wt 186.9 lb

## 2014-08-04 DIAGNOSIS — Z8589 Personal history of malignant neoplasm of other organs and systems: Secondary | ICD-10-CM | POA: Diagnosis not present

## 2014-08-04 DIAGNOSIS — Z905 Acquired absence of kidney: Secondary | ICD-10-CM | POA: Insufficient documentation

## 2014-08-04 DIAGNOSIS — Z923 Personal history of irradiation: Secondary | ICD-10-CM | POA: Diagnosis not present

## 2014-08-04 DIAGNOSIS — K117 Disturbances of salivary secretion: Secondary | ICD-10-CM | POA: Insufficient documentation

## 2014-08-04 DIAGNOSIS — Z85528 Personal history of other malignant neoplasm of kidney: Secondary | ICD-10-CM | POA: Insufficient documentation

## 2014-08-04 DIAGNOSIS — C099 Malignant neoplasm of tonsil, unspecified: Secondary | ICD-10-CM

## 2014-08-04 MED ORDER — LARYNGOSCOPY SOLUTION RAD-ONC
15.0000 mL | Freq: Once | TOPICAL | Status: AC
  Administered 2014-08-04: 15 mL via TOPICAL
  Filled 2014-08-04: qty 15

## 2014-08-04 NOTE — Progress Notes (Signed)
  Radiation Oncology         (336) [phone] ________________________________  Name: Bobby Gonzalez MRN: [id number]  Date: 08/04/2014  DOB: April 23, 1966  Follow-Up Visit Note  CC: Shirline Frees, MD  [name], MD  Diagnosis:   Squamous cell carcinoma of the left tonsil with history of renal cell carcinoma status post nephrectomy  Interval Since Last Radiation:  Approximately 2 years   Narrative:  The patient returns today for routine follow-up.  He states he has been doing well overall. He continues to complain of xerostomia and change in taste. No dysphagia or odynophagia. No pain in the head and neck region. He had a CT scan and September which I have personally reviewed. This did not show any signs of recurrent/progressive disease.                          ALLERGIES:  is allergic to banana and penicillins.  Meds: Current Outpatient Prescriptions  Medication Sig Dispense Refill  . levothyroxine (SYNTHROID, LEVOTHROID) 75 MCG tablet Take 75 mcg by mouth daily before breakfast.    . pilocarpine (SALAGEN) 5 MG tablet Take 1 tablet (5 mg total) by mouth 3 (three) times daily. (Patient not taking: Reported on 08/04/2014) 90 tablet 7   No current facility-administered medications for this encounter.    Physical Findings: The patient is in no acute distress. Patient is alert and oriented.  weight is 186 lb 14.4 oz (84.777 kg). His oral temperature is 98.2 F (36.8 C). His blood pressure is 120/78 and his pulse is 62. His respiration is 12 and oxygen saturation is 98%. .   Neck supple daily lymphadenopathy; the skin is soft without substantial fibrosis Oral cavity clear Fiberoptic exam: After the use of topical anesthetic, the flexible laryngoscope was passed through the right near. Good visualization was obtained. No lesions or suspicious findings within the larynx, hypopharynx, oropharynx or nasopharynx.   Lab Findings: Lab Results  Component Value Date   WBC 4.7 05/13/2014   HGB  15.9 05/13/2014   HCT 47.5 05/13/2014   MCV 90.8 05/13/2014   PLT 192 05/13/2014     Radiographic Findings: No results found.  Impression:    The patient is doing well at this time. No new complaints. Some ongoing xerostomia/change in taste.  We discussed some possible strategies to address this.  Plan:  The patient will followup in our clinic in 6 months.  I spent 15 minutes with the patient today, the majority of which was spent counseling the patient on the diagnosis of cancer and coordinating care.   Jodelle Gross, M.D., Ph.D.

## 2014-08-04 NOTE — Progress Notes (Signed)
He is currently in no pain. Pt complains of Chills and Fatigue.  Pt has had dysphagia for solids dry foods have become stuck. PO Diet: Regular. Oral exam reveals moist mucous membrane, noted a moderate blotchy white tongue. Pt continues to have dry mouth, still hasn't "gotten taste back yet." Skin warm dry and intact.

## 2014-09-16 ENCOUNTER — Other Ambulatory Visit (HOSPITAL_COMMUNITY): Payer: Self-pay | Admitting: Urology

## 2014-09-16 ENCOUNTER — Ambulatory Visit (HOSPITAL_COMMUNITY)
Admission: RE | Admit: 2014-09-16 | Discharge: 2014-09-16 | Disposition: A | Discharge status: Home / Self Care | Payer: 59 | Source: Ambulatory Visit | Attending: Urology | Admitting: Urology

## 2014-09-16 DIAGNOSIS — C649 Malignant neoplasm of unspecified kidney, except renal pelvis: Secondary | ICD-10-CM

## 2015-01-23 ENCOUNTER — Ambulatory Visit (INDEPENDENT_AMBULATORY_CARE_PROVIDER_SITE_OTHER): Payer: 59 | Admitting: Family Medicine

## 2015-01-23 ENCOUNTER — Ambulatory Visit (HOSPITAL_BASED_OUTPATIENT_CLINIC_OR_DEPARTMENT_OTHER)
Admission: RE | Admit: 2015-01-23 | Discharge: 2015-01-23 | Disposition: A | Discharge status: Home / Self Care | Payer: 59 | Source: Ambulatory Visit | Attending: Family Medicine | Admitting: Family Medicine

## 2015-01-23 VITALS — BP 124/76 | HR 55 | Temp 98.7°F | Resp 16 | Ht 70.0 in | Wt 181.0 lb

## 2015-01-23 DIAGNOSIS — R600 Localized edema: Secondary | ICD-10-CM | POA: Insufficient documentation

## 2015-01-23 DIAGNOSIS — T754XXA Electrocution, initial encounter: Secondary | ICD-10-CM

## 2015-01-23 DIAGNOSIS — M79604 Pain in right leg: Secondary | ICD-10-CM | POA: Diagnosis present

## 2015-01-23 LAB — POCT URINALYSIS DIPSTICK
Bilirubin, UA: NEGATIVE
Glucose, UA: 250
Ketones, UA: NEGATIVE
Leukocytes, UA: NEGATIVE
Nitrite, UA: NEGATIVE
Protein, UA: NEGATIVE
Spec Grav, UA: 1.01
Urobilinogen, UA: 1
pH, UA: 6

## 2015-01-23 LAB — COMPLETE METABOLIC PANEL WITH GFR
ALT: 19 U/L (ref 0–53)
AST: 21 U/L (ref 0–37)
Albumin: 4.4 g/dL (ref 3.5–5.2)
Alkaline Phosphatase: 57 U/L (ref 39–117)
BUN: 20 mg/dL (ref 6–23)
CO2: 30 mEq/L (ref 19–32)
Calcium: 9.3 mg/dL (ref 8.4–10.5)
Chloride: 102 mEq/L (ref 96–112)
Creat: 1.2 mg/dL (ref 0.50–1.35)
GFR, Est African American: 82 mL/min
GFR, Est Non African American: 71 mL/min
Glucose, Bld: 138 mg/dL — ABNORMAL HIGH (ref 70–99)
Potassium: 4 mEq/L (ref 3.5–5.3)
Sodium: 139 mEq/L (ref 135–145)
Total Bilirubin: 0.7 mg/dL (ref 0.2–1.2)
Total Protein: 6.6 g/dL (ref 6.0–8.3)

## 2015-01-23 LAB — CK: Total CK: 258 U/L — ABNORMAL HIGH (ref 7–232)

## 2015-01-23 NOTE — Patient Instructions (Signed)
It appears that this will heal by itself. Were running test to make sure there is nothing further going on that needs attention.
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
## 2015-02-02 ENCOUNTER — Ambulatory Visit
Admission: RE | Admit: 2015-02-02 | Discharge: 2015-02-02 | Disposition: A | Discharge status: Home / Self Care | Payer: 59 | Source: Ambulatory Visit | Attending: Radiation Oncology | Admitting: Radiation Oncology

## 2015-02-03 ENCOUNTER — Ambulatory Visit
Admission: RE | Admit: 2015-02-03 | Discharge: 2015-02-03 | Disposition: A | Discharge status: Home / Self Care | Payer: 59 | Source: Ambulatory Visit | Attending: Radiation Oncology | Admitting: Radiation Oncology

## 2015-02-03 ENCOUNTER — Encounter: Payer: Self-pay | Admitting: Radiation Oncology

## 2015-02-03 VITALS — BP 129/83 | HR 62 | Temp 98.3°F | Resp 20 | Ht 70.0 in | Wt 174.9 lb

## 2015-02-03 DIAGNOSIS — Z08 Encounter for follow-up examination after completed treatment for malignant neoplasm: Secondary | ICD-10-CM | POA: Diagnosis not present

## 2015-02-03 DIAGNOSIS — Z923 Personal history of irradiation: Secondary | ICD-10-CM | POA: Diagnosis not present

## 2015-02-03 DIAGNOSIS — Z85528 Personal history of other malignant neoplasm of kidney: Secondary | ICD-10-CM | POA: Insufficient documentation

## 2015-02-03 DIAGNOSIS — C099 Malignant neoplasm of tonsil, unspecified: Secondary | ICD-10-CM

## 2015-02-03 DIAGNOSIS — R531 Weakness: Secondary | ICD-10-CM | POA: Diagnosis not present

## 2015-02-03 DIAGNOSIS — Z905 Acquired absence of kidney: Secondary | ICD-10-CM | POA: Diagnosis not present

## 2015-02-03 DIAGNOSIS — Z8589 Personal history of malignant neoplasm of other organs and systems: Secondary | ICD-10-CM | POA: Diagnosis not present

## 2015-02-03 DIAGNOSIS — K117 Disturbances of salivary secretion: Secondary | ICD-10-CM | POA: Diagnosis not present

## 2015-02-03 LAB — TSH CHCC: TSH: 2.165 m(IU)/L (ref 0.320–4.118)

## 2015-02-03 LAB — T4, FREE: Free T4: 1.37 ng/dL (ref 0.80–1.80)
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
## 2015-02-09 ENCOUNTER — Ambulatory Visit: Payer: BC Managed Care – PPO | Admitting: Radiation Oncology

## 2015-04-03 ENCOUNTER — Ambulatory Visit
Admission: RE | Admit: 2015-04-03 | Discharge: 2015-04-03 | Disposition: A | Discharge status: Home / Self Care | Payer: 59 | Source: Ambulatory Visit | Attending: Family Medicine | Admitting: Family Medicine

## 2015-04-03 ENCOUNTER — Other Ambulatory Visit: Payer: Self-pay | Admitting: Family Medicine

## 2015-04-03 DIAGNOSIS — M5412 Radiculopathy, cervical region: Secondary | ICD-10-CM

## 2015-05-04 ENCOUNTER — Telehealth: Payer: Self-pay | Admitting: Hematology and Oncology

## 2015-05-04 NOTE — Telephone Encounter (Signed)
returned call and s.w. pt and confirmed appt.....pt oka dn aware °

## 2015-05-10 ENCOUNTER — Telehealth: Payer: Self-pay

## 2015-05-10 NOTE — Telephone Encounter (Signed)
Pt questioning when he sees Dr Alvy Bimler after his CT. Clarified appts w/pt.

## 2015-05-17 ENCOUNTER — Ambulatory Visit (HOSPITAL_COMMUNITY)
Admission: RE | Admit: 2015-05-17 | Discharge: 2015-05-17 | Disposition: A | Discharge status: Home / Self Care | Payer: 59 | Source: Ambulatory Visit | Attending: Hematology and Oncology | Admitting: Hematology and Oncology

## 2015-05-17 ENCOUNTER — Other Ambulatory Visit (HOSPITAL_BASED_OUTPATIENT_CLINIC_OR_DEPARTMENT_OTHER): Payer: 59

## 2015-05-17 DIAGNOSIS — C099 Malignant neoplasm of tonsil, unspecified: Secondary | ICD-10-CM | POA: Insufficient documentation

## 2015-05-17 DIAGNOSIS — E038 Other specified hypothyroidism: Secondary | ICD-10-CM | POA: Diagnosis not present

## 2015-05-17 DIAGNOSIS — C641 Malignant neoplasm of right kidney, except renal pelvis: Secondary | ICD-10-CM | POA: Insufficient documentation

## 2015-05-17 LAB — CBC WITH DIFFERENTIAL/PLATELET
BASO%: 0 % (ref 0.0–2.0)
Basophils Absolute: 0 10*3/uL (ref 0.0–0.1)
EOS%: 5.3 % (ref 0.0–7.0)
Eosinophils Absolute: 0.2 10*3/uL (ref 0.0–0.5)
HCT: 46.4 % (ref 38.4–49.9)
HGB: 15.8 g/dL (ref 13.0–17.1)
LYMPH%: 15.3 % (ref 14.0–49.0)
MCH: 31 pg (ref 27.2–33.4)
MCHC: 34.1 g/dL (ref 32.0–36.0)
MCV: 91 fL (ref 79.3–98.0)
MONO#: 0.4 10*3/uL (ref 0.1–0.9)
MONO%: 10.8 % (ref 0.0–14.0)
NEUT#: 2.8 10*3/uL (ref 1.5–6.5)
NEUT%: 68.6 % (ref 39.0–75.0)
Platelets: 176 10*3/uL (ref 140–400)
RBC: 5.1 10*6/uL (ref 4.20–5.82)
RDW: 12.7 % (ref 11.0–14.6)
WBC: 4 10*3/uL (ref 4.0–10.3)
lymph#: 0.6 10*3/uL — ABNORMAL LOW (ref 0.9–3.3)

## 2015-05-17 LAB — COMPREHENSIVE METABOLIC PANEL (CC13)
ALT: 18 U/L (ref 0–55)
AST: 21 U/L (ref 5–34)
Albumin: 4 g/dL (ref 3.5–5.0)
Alkaline Phosphatase: 67 U/L (ref 40–150)
Anion Gap: 5 mEq/L (ref 3–11)
BUN: 23.5 mg/dL (ref 7.0–26.0)
CO2: 28 mEq/L (ref 22–29)
Calcium: 9.4 mg/dL (ref 8.4–10.4)
Chloride: 106 mEq/L (ref 98–109)
Creatinine: 1.4 mg/dL — ABNORMAL HIGH (ref 0.7–1.3)
EGFR: 57 mL/min/{1.73_m2} — ABNORMAL LOW (ref >90)
Glucose: 101 mg/dl (ref 70–140)
Potassium: 4.6 mEq/L (ref 3.5–5.1)
Sodium: 139 mEq/L (ref 136–145)
Total Bilirubin: 0.77 mg/dL (ref 0.20–1.20)
Total Protein: 6.6 g/dL (ref 6.4–8.3)

## 2015-05-17 LAB — TSH CHCC: TSH: 2.293 m(IU)/L (ref 0.320–4.118)

## 2015-05-17 MED ORDER — IOHEXOL 300 MG/ML  SOLN
50.0000 mL | Freq: Once | INTRAMUSCULAR | Status: AC | PRN
  Administered 2015-05-17: 50 mL via ORAL

## 2015-05-17 MED ORDER — IOHEXOL 300 MG/ML  SOLN
100.0000 mL | Freq: Once | INTRAMUSCULAR | Status: AC | PRN
  Administered 2015-05-17: 80 mL via INTRAVENOUS

## 2015-05-19 ENCOUNTER — Encounter: Payer: Self-pay | Admitting: Hematology and Oncology

## 2015-05-19 ENCOUNTER — Ambulatory Visit (HOSPITAL_BASED_OUTPATIENT_CLINIC_OR_DEPARTMENT_OTHER): Payer: 59 | Admitting: Hematology and Oncology

## 2015-05-19 VITALS — BP 118/59 | HR 65 | Temp 98.1°F | Resp 18 | Ht 70.0 in | Wt 176.0 lb

## 2015-05-19 DIAGNOSIS — R682 Dry mouth, unspecified: Secondary | ICD-10-CM | POA: Diagnosis not present

## 2015-05-19 DIAGNOSIS — C649 Malignant neoplasm of unspecified kidney, except renal pelvis: Secondary | ICD-10-CM

## 2015-05-19 DIAGNOSIS — Z299 Encounter for prophylactic measures, unspecified: Secondary | ICD-10-CM

## 2015-05-19 DIAGNOSIS — N182 Chronic kidney disease, stage 2 (mild): Secondary | ICD-10-CM | POA: Diagnosis not present

## 2015-05-19 DIAGNOSIS — E038 Other specified hypothyroidism: Secondary | ICD-10-CM

## 2015-05-19 DIAGNOSIS — R918 Other nonspecific abnormal finding of lung field: Secondary | ICD-10-CM

## 2015-05-19 DIAGNOSIS — C099 Malignant neoplasm of tonsil, unspecified: Secondary | ICD-10-CM | POA: Diagnosis not present

## 2015-05-19 DIAGNOSIS — K117 Disturbances of salivary secretion: Secondary | ICD-10-CM

## 2015-05-19 DIAGNOSIS — Z23 Encounter for immunization: Secondary | ICD-10-CM | POA: Diagnosis not present

## 2015-05-19 DIAGNOSIS — Z418 Encounter for other procedures for purposes other than remedying health state: Secondary | ICD-10-CM

## 2015-05-19 MED ORDER — INFLUENZA VAC SPLIT QUAD 0.5 ML IM SUSY
0.5000 mL | PREFILLED_SYRINGE | Freq: Once | INTRAMUSCULAR | Status: AC
  Administered 2015-05-19: 0.5 mL via INTRAMUSCULAR
  Filled 2015-05-19: qty 0.5

## 2015-05-19 MED ORDER — LEVOTHYROXINE SODIUM 75 MCG PO TABS
75.0000 ug | ORAL_TABLET | Freq: Every day | ORAL | Status: DC
Start: 2015-05-19 — End: 2018-11-06

## 2015-05-19 MED ORDER — PILOCARPINE HCL 5 MG PO TABS: 5.0000 mg | ORAL_TABLET | Freq: Three times a day (TID) | ORAL | Status: DC

## 2015-05-19 NOTE — Assessment & Plan Note (Signed)
This is likely benign.  Repeat CT is stable. I would discontinue surveillance imaging.

## 2015-05-19 NOTE — Assessment & Plan Note (Signed)
This is related to right nephrectomy. His kidney function is stable.

## 2015-05-19 NOTE — Assessment & Plan Note (Signed)
This is due to prior radiation exposure. I refilled his prescription of pilocarpine.

## 2015-05-19 NOTE — Progress Notes (Signed)
[place] OFFICE PROGRESS NOTE  Patient Care Team: Shirline Frees, MD as PCP - General (Family Medicine) [name], MD as Attending Physician (Otolaryngology) Lenn Cal, DDS (Dentistry) Kyung Rudd, MD (Radiation Oncology) Shirline Frees, MD as Consulting Physician (Family Medicine) Raynelle Bring, MD (Urology) Heath Lark, MD as Consulting Physician (Hematology and Oncology)  SUMMARY OF ONCOLOGIC HISTORY: 1. 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 Right renal mass with positive PET scan; pending evaluation and workup.  2. Right renal cell carcinoma, clear cell subtype; grade III/IV; pT1b.    PAST THERAPY:  1. Due to start concurrent chemo radiation today 03/30/2012 with daily XRT and weekly Cisplatin.  2. Right nephrectomy on 06/29/2012.   INTERVAL HISTORY: Please see below for problem oriented charting. He feels well. Denies any new lump in his neck. He has persistent dry mouth. Denies dysphagia. Denies recent hematuria.  REVIEW OF SYSTEMS:   Constitutional: Denies fevers, chills or abnormal weight loss Eyes: Denies blurriness of vision Ears, nose, mouth, throat, and face: Denies mucositis or sore throat Respiratory: Denies cough, dyspnea or wheezes Cardiovascular: Denies palpitation, chest discomfort or lower extremity swelling Gastrointestinal:  Denies nausea, heartburn or change in bowel habits Skin: Denies abnormal skin rashes Lymphatics: Denies new lymphadenopathy or easy bruising Neurological:Denies numbness, tingling or new weaknesses Behavioral/Psych: Mood is stable, no new changes  All other systems were reviewed with the patient and are negative.  I have reviewed the past medical history, past surgical history, social history and family history with the patient and they are unchanged from previous note.  ALLERGIES:  is allergic to banana and penicillins.  MEDICATIONS:  Current Outpatient Prescriptions   Medication Sig Dispense Refill  . levothyroxine (SYNTHROID, LEVOTHROID) 75 MCG tablet Take 1 tablet (75 mcg total) by mouth daily before breakfast. 90 tablet 3  . pilocarpine (SALAGEN) 5 MG tablet Take 1 tablet (5 mg total) by mouth 3 (three) times daily. 90 tablet 7   No current facility-administered medications for this visit.    PHYSICAL EXAMINATION: ECOG PERFORMANCE STATUS: 0 - Asymptomatic  Filed Vitals:   05/19/15 1532  BP: 118/59  Pulse: 65  Temp: 98.1 F (36.7 C)  Resp: 18   Filed Weights   05/19/15 1532  Weight: 176 lb (79.833 kg)    GENERAL:alert, no distress and comfortable SKIN: skin color, texture, turgor are normal, no rashes or significant lesions EYES: normal, Conjunctiva are pink and non-injected, sclera clear OROPHARYNX:no exudate, no erythema and lips, buccal mucosa, and tongue normal  NECK: supple, thyroid normal size, non-tender, without nodularity LYMPH:  no palpable lymphadenopathy in the cervical, axillary or inguinal LUNGS: clear to auscultation and percussion with normal breathing effort HEART: regular rate & rhythm and no murmurs and no lower extremity edema ABDOMEN:abdomen soft, non-tender and normal bowel sounds Musculoskeletal:no cyanosis of digits and no clubbing  NEURO: alert & oriented x 3 with fluent speech, no focal motor/sensory deficits  LABORATORY DATA:  I have reviewed the data as listed    Component Value Date/Time   NA 139 05/17/2015 0818   NA 139 01/23/2015 1651   K 4.6 05/17/2015 0818   K 4.0 01/23/2015 1651   CL 102 01/23/2015 1651   CL 106 02/12/2013 1414   CO2 28 05/17/2015 0818   CO2 30 01/23/2015 1651   GLUCOSE 101 05/17/2015 0818   GLUCOSE 138* 01/23/2015 1651   GLUCOSE 119* 02/12/2013 1414   BUN 23.5 05/17/2015 0818   BUN 20  01/23/2015 1651   CREATININE 1.4* 05/17/2015 0818   CREATININE 1.20 01/23/2015 1651   CREATININE 1.32 10/30/2012 1010   CALCIUM 9.4 05/17/2015 0818   CALCIUM 9.3 01/23/2015 1651   PROT  6.6 05/17/2015 0818   PROT 6.6 01/23/2015 1651   ALBUMIN 4.0 05/17/2015 0818   ALBUMIN 4.4 01/23/2015 1651   AST 21 05/17/2015 0818   AST 21 01/23/2015 1651   ALT 18 05/17/2015 0818   ALT 19 01/23/2015 1651   ALKPHOS 67 05/17/2015 0818   ALKPHOS 57 01/23/2015 1651   BILITOT 0.77 05/17/2015 0818   BILITOT 0.7 01/23/2015 1651   GFRNONAA 71 01/23/2015 1651   GFRNONAA 63* 10/30/2012 1010   GFRAA 82 01/23/2015 1651   GFRAA 73* 10/30/2012 1010    No results found for: SPEP, UPEP  Lab Results  Component Value Date   WBC 4.0 05/17/2015   NEUTROABS 2.8 05/17/2015   HGB 15.8 05/17/2015   HCT 46.4 05/17/2015   MCV 91.0 05/17/2015   PLT 176 05/17/2015      Chemistry      Component Value Date/Time   NA 139 05/17/2015 0818   NA 139 01/23/2015 1651   K 4.6 05/17/2015 0818   K 4.0 01/23/2015 1651   CL 102 01/23/2015 1651   CL 106 02/12/2013 1414   CO2 28 05/17/2015 0818   CO2 30 01/23/2015 1651   BUN 23.5 05/17/2015 0818   BUN 20 01/23/2015 1651   CREATININE 1.4* 05/17/2015 0818   CREATININE 1.20 01/23/2015 1651   CREATININE 1.32 10/30/2012 1010      Component Value Date/Time   CALCIUM 9.4 05/17/2015 0818   CALCIUM 9.3 01/23/2015 1651   ALKPHOS 67 05/17/2015 0818   ALKPHOS 57 01/23/2015 1651   AST 21 05/17/2015 0818   AST 21 01/23/2015 1651   ALT 18 05/17/2015 0818   ALT 19 01/23/2015 1651   BILITOT 0.77 05/17/2015 0818   BILITOT 0.7 01/23/2015 1651       RADIOGRAPHIC STUDIES: I review CT scan with him and his wife show no evidence of recurrence of disease I have personally reviewed the radiological images as listed and agreed with the findings in the report.   ASSESSMENT & PLAN:  Renal cell carcinoma Clinically, he has no signs of recurrence. CT scan of the abdomen and pelvis is negative. Recommend observation only. He will continue to follow with urologist.    Tonsil cancer Clinically, he has no signs of recurrence.  In the meantime, recommend he continue  followup with ear, nose and throat surgeon for surveillance endoscopy. I will discharge the patient from the clinic.    Chronic kidney disease This is related to right nephrectomy. His kidney function is stable.  Dry mouth This is due to prior radiation exposure. I refilled his prescription of pilocarpine.     Hypothyroid Repeat thyroid function tests is adequate.  This is related to side effects of prior radiation. He'll continue the same dose of thyroid replacement therapy. He will need to continue on thyroid replacement therapy indefinitely. I recommend future refill with primary care doctor.    Multiple lung nodules on CT This is likely benign.  Repeat CT is stable. I would discontinue surveillance imaging.  Preventive measure We discussed the importance of preventive care and reviewed the vaccination programs. He does not have any prior allergic reactions to influenza vaccination. He agrees to proceed with influenza vaccination today and we will administer it today at the clinic.    No orders  of the defined types were placed in this encounter.   All questions were answered. The patient knows to call the clinic with any problems, questions or concerns. No barriers to learning was detected. I spent 25 minutes counseling the patient face to face. The total time spent in the appointment was 30 minutes and more than 50% was on counseling and review of test results     [facility], [place], MD 05/19/2015 4:59 PM

## 2015-05-19 NOTE — Assessment & Plan Note (Addendum)
Clinically, he has no signs of recurrence. CT scan of the abdomen and pelvis is negative. Recommend observation only. He will continue to follow with urologist.

## 2015-05-19 NOTE — Assessment & Plan Note (Signed)
Clinically, he has no signs of recurrence.  In the meantime, recommend he continue followup with ear, nose and throat surgeon for surveillance endoscopy. I will discharge the patient from the clinic.

## 2015-05-19 NOTE — Assessment & Plan Note (Signed)
We discussed the importance of preventive care and reviewed the vaccination programs. He does not have any prior allergic reactions to influenza vaccination. He agrees to proceed with influenza vaccination today and we will administer it today at the clinic.  

## 2015-05-19 NOTE — Assessment & Plan Note (Addendum)
Repeat thyroid function tests is adequate.  This is related to side effects of prior radiation. He'll continue the same dose of thyroid replacement therapy. He will need to continue on thyroid replacement therapy indefinitely. I recommend future refill with primary care doctor.

## 2015-08-11 ENCOUNTER — Ambulatory Visit: Payer: 59 | Admitting: Radiation Oncology

## 2015-08-14 ENCOUNTER — Ambulatory Visit
Admission: RE | Admit: 2015-08-14 | Discharge: 2015-08-14 | Disposition: A | Discharge status: Home / Self Care | Payer: 59 | Source: Ambulatory Visit | Attending: Radiation Oncology | Admitting: Radiation Oncology

## 2015-08-14 ENCOUNTER — Encounter: Payer: Self-pay | Admitting: Radiation Oncology

## 2015-08-14 VITALS — BP 123/69 | HR 52 | Temp 97.8°F | Resp 20 | Ht 70.0 in | Wt 171.5 lb

## 2015-08-14 DIAGNOSIS — C099 Malignant neoplasm of tonsil, unspecified: Secondary | ICD-10-CM

## 2015-08-14 NOTE — Progress Notes (Signed)
  Radiation Oncology         [phone] ________________________________  Name: Bobby Gonzalez MRN: [id number]  Date: 08/14/2015  DOB: Apr 22, 1966  Follow-Up Visit Note  CC: Shirline Frees, MD  [name], MD  Diagnosis:   Squamous cell carcinoma of the left tonsil with history of renal cell carcinoma status post nephrectomy  Interval Since Last Radiation: The patient completed all radiation treatment on 05/15/2012   Narrative: Follow up s/p Radiation left tonsil 03/30/12-05/15/12. Takes Salagen daily which helps the dryness in mouth, still eats mostly moist foods, appetite good, has tingling in left side head at times. Resolves quickly , has been this way for 6-8 months, No pain, saw his Primary MD about this and nothing came up. Patient to see Dr.Rosen in February.                 ALLERGIES:  is allergic to banana and penicillins.  Meds: Current Outpatient Prescriptions  Medication Sig Dispense Refill  . levothyroxine (SYNTHROID, LEVOTHROID) 75 MCG tablet Take 1 tablet (75 mcg total) by mouth daily before breakfast. 90 tablet 3  . pilocarpine (SALAGEN) 5 MG tablet Take 1 tablet (5 mg total) by mouth 3 (three) times daily. 90 tablet 7   No current facility-administered medications for this encounter.    Physical Findings: The patient is in no acute distress. Patient is alert and oriented.  height is 5\' 10"  (1.778 m) and weight is 171 lb 8 oz (77.792 kg). His oral temperature is 97.8 F (36.6 C). His blood pressure is 123/69 and his pulse is 52. His respiration is 20. Marland Kitchen   General: Well-developed, in no acute distress HEENT: Normocephalic, atraumatic Neck: Supple without lymphadenopathy Oral:  No lesion seen or palpable extending to oropharynx   Lab Findings: Lab Results  Component Value Date   WBC 4.0 05/17/2015   HGB 15.8 05/17/2015   HCT 46.4 05/17/2015   MCV 91.0 05/17/2015   PLT 176 05/17/2015     Radiographic Findings: No results found.  Impression: The  patient is doing well at this time. No new complaints.   Plan:  The patient will followup in our clinic in 6 months.   I spent 15 minutes with the patient today, the majority of which was spent counseling the patient on the diagnosis of cancer and coordinating care.  ------------------------------------------------  Jodelle Gross, MD, PhD  This document serves as a record of services personally performed by Kyung Rudd, MD. It was created on his behalf by Derek Mound, a trained medical scribe. The creation of this record is based on the scribe's personal observations and the provider's statements to them. This document has been checked and approved by the attending provider.

## 2015-08-14 NOTE — Progress Notes (Signed)
Follow up s/p  Radiation left tonsil  03/30/12-05/15/12,  Takes salagen daily which helps the dryness in mouth, still eats mostly moist foods, appetite good, has tingling in left side head at times,  Resolves quickly , has been this way for 6-8 months,   No pain  , saw his Primary MD  About this and nothing came up  BP 123/69 mmHg  Pulse 52  Temp(Src) 97.8 F (36.6 C) (Oral)  Resp 20  Ht 5\' 10"  (1.778 m)  Wt 171 lb 8 oz (77.792 kg)  BMI 24.61 kg/m2  Wt Readings from Last 3 Encounters:  08/14/15 171 lb 8 oz (77.792 kg)  05/19/15 176 lb (79.833 kg)  02/03/15 174 lb 14.4 oz (79.334 kg)

## 2015-09-26 DIAGNOSIS — K117 Disturbances of salivary secretion: Secondary | ICD-10-CM | POA: Diagnosis not present

## 2015-09-26 DIAGNOSIS — H9202 Otalgia, left ear: Secondary | ICD-10-CM | POA: Diagnosis not present

## 2015-09-26 DIAGNOSIS — Z85818 Personal history of malignant neoplasm of other sites of lip, oral cavity, and pharynx: Secondary | ICD-10-CM | POA: Diagnosis not present

## 2015-11-21 DIAGNOSIS — Z0000 Encounter for general adult medical examination without abnormal findings: Secondary | ICD-10-CM | POA: Diagnosis not present

## 2015-11-21 DIAGNOSIS — C641 Malignant neoplasm of right kidney, except renal pelvis: Secondary | ICD-10-CM | POA: Diagnosis not present

## 2016-02-05 ENCOUNTER — Encounter: Payer: Self-pay | Admitting: Radiation Oncology

## 2016-02-05 ENCOUNTER — Ambulatory Visit
Admission: RE | Admit: 2016-02-05 | Discharge: 2016-02-05 | Disposition: A | Discharge status: Home / Self Care | Payer: 59 | Source: Ambulatory Visit | Attending: Radiation Oncology | Admitting: Radiation Oncology

## 2016-02-05 VITALS — BP 106/66 | HR 50 | Temp 97.8°F | Resp 20 | Ht 70.0 in | Wt 171.3 lb

## 2016-02-05 DIAGNOSIS — Z85528 Personal history of other malignant neoplasm of kidney: Secondary | ICD-10-CM | POA: Insufficient documentation

## 2016-02-05 DIAGNOSIS — Z905 Acquired absence of kidney: Secondary | ICD-10-CM | POA: Diagnosis not present

## 2016-02-05 DIAGNOSIS — C7989 Secondary malignant neoplasm of other specified sites: Secondary | ICD-10-CM | POA: Diagnosis not present

## 2016-02-05 DIAGNOSIS — C099 Malignant neoplasm of tonsil, unspecified: Secondary | ICD-10-CM

## 2016-02-05 NOTE — Progress Notes (Signed)
Follow up left tonsil 03/30/12-05/15/12, patient denypain, nausea, still eats soft foods, dry mouth more this past week,  Sometimes left side neck feels his muscle is pulling tight ,rubs it  And resolves 8:09 AM BP 106/66 mmHg  Pulse 50  Temp(Src) 97.8 F (36.6 C) (Oral)  Resp 20  Ht 5\' 10"  (1.778 m)  Wt 171 lb 4.8 oz (77.701 kg)  BMI 24.58 kg/m2  Wt Readings from Last 3 Encounters:  02/05/16 171 lb 4.8 oz (77.701 kg)  08/14/15 171 lb 8 oz (77.792 kg)  05/19/15 176 lb (79.833 kg)

## 2016-02-05 NOTE — Progress Notes (Signed)
  Radiation Oncology         [phone] ________________________________  Name: Bobby Gonzalez MRN: [id number]  Date: 02/05/2016  DOB: Jun 18, 1966  Follow-Up Visit Note  CC: Bobby Frees, MD  Bobby Gala, MD  Diagnosis:   Squamous cell carcinoma of the left tonsil with history of renal cell carcinoma status post nephrectomy  Interval Since Last Radiation: 3 years and 9 months  Radiation to the left tonsil 03/30/12-05/15/12  Narrative: The patient presents to the clinic for a routine follow up. The patient denies pain and nausea. He still eats soft foods and has a dry mouth. He stays away from bread and red meat. Sometimes the patient feels a muscle strain on the left side of his neck. His taste is slowly coming back, but is not yet 100%. Dr. Alvy Gonzalez has discharged the patient from med/onc.  ALLERGIES:  is allergic to banana and penicillins.  Meds: Current Outpatient Prescriptions  Medication Sig Dispense Refill  . levothyroxine (SYNTHROID, LEVOTHROID) 75 MCG tablet Take 1 tablet (75 mcg total) by mouth daily before breakfast. 90 tablet 3  . pilocarpine (SALAGEN) 5 MG tablet Take 1 tablet (5 mg total) by mouth 3 (three) times daily. 90 tablet 7   No current facility-administered medications for this encounter.    Physical Findings: The patient is in no acute distress. Patient is alert and oriented.  height is 5\' 10"  (1.778 m) and weight is 171 lb 4.8 oz (77.701 kg). His oral temperature is 97.8 F (36.6 C). His blood pressure is 106/66 and his pulse is 50. His respiration is 20. Marland Kitchen   General: Well-developed, in no acute distress HEENT: Normocephalic, atraumatic Neck: Supple without lymphadenopathy Oral:  No palpable or visible lesion extending to the oropharynx.  Lab Findings: Lab Results  Component Value Date   WBC 4.0 05/17/2015   HGB 15.8 05/17/2015   HCT 46.4 05/17/2015   MCV 91.0 05/17/2015   PLT 176 05/17/2015     Radiographic Findings: No results  found.  Impression: The patient is doing well at this time. No new complaints and no signs of recurrence. The left sided muscle strain the patient experiences could be attributed to firmness/scarring from his surgery and radiation treatment.  Plan:  The patient will followup in our clinic in 6 months.  I spent 15 minutes with the patient today, the majority of which was spent counseling the patient on the diagnosis of cancer and coordinating care.  ------------------------------------------------  Bobby Gross, MD, PhD  This document serves as a record of services personally performed by Bobby Rudd, MD. It was created on his behalf by Bobby Gonzalez, a trained medical scribe. The creation of this record is based on the scribe's personal observations and the provider's statements to them. This document has been checked and approved by the attending provider.

## 2016-02-11 ENCOUNTER — Encounter: Payer: Self-pay | Admitting: Radiation Oncology

## 2016-05-22 ENCOUNTER — Other Ambulatory Visit: Payer: Self-pay | Admitting: Hematology and Oncology

## 2016-05-22 DIAGNOSIS — E038 Other specified hypothyroidism: Secondary | ICD-10-CM

## 2016-06-13 DIAGNOSIS — Z125 Encounter for screening for malignant neoplasm of prostate: Secondary | ICD-10-CM | POA: Diagnosis not present

## 2016-06-13 DIAGNOSIS — E039 Hypothyroidism, unspecified: Secondary | ICD-10-CM | POA: Diagnosis not present

## 2016-06-13 DIAGNOSIS — C642 Malignant neoplasm of left kidney, except renal pelvis: Secondary | ICD-10-CM | POA: Diagnosis not present

## 2016-06-13 DIAGNOSIS — C099 Malignant neoplasm of tonsil, unspecified: Secondary | ICD-10-CM | POA: Diagnosis not present

## 2016-06-13 DIAGNOSIS — Z23 Encounter for immunization: Secondary | ICD-10-CM | POA: Diagnosis not present

## 2016-06-13 DIAGNOSIS — K219 Gastro-esophageal reflux disease without esophagitis: Secondary | ICD-10-CM | POA: Diagnosis not present

## 2016-06-25 ENCOUNTER — Other Ambulatory Visit: Payer: Self-pay | Admitting: Hematology and Oncology

## 2016-06-25 DIAGNOSIS — K117 Disturbances of salivary secretion: Secondary | ICD-10-CM

## 2016-06-25 DIAGNOSIS — R682 Dry mouth, unspecified: Secondary | ICD-10-CM

## 2016-06-25 DIAGNOSIS — C099 Malignant neoplasm of tonsil, unspecified: Secondary | ICD-10-CM

## 2016-06-25 NOTE — Telephone Encounter (Signed)
Pls tell patient to get future refills with primary doctor

## 2016-07-15 DIAGNOSIS — C641 Malignant neoplasm of right kidney, except renal pelvis: Secondary | ICD-10-CM | POA: Diagnosis not present

## 2016-07-15 DIAGNOSIS — Z85528 Personal history of other malignant neoplasm of kidney: Secondary | ICD-10-CM | POA: Diagnosis not present

## 2016-07-22 DIAGNOSIS — Z85528 Personal history of other malignant neoplasm of kidney: Secondary | ICD-10-CM | POA: Diagnosis not present

## 2016-07-31 ENCOUNTER — Other Ambulatory Visit: Payer: Self-pay | Admitting: Hematology and Oncology

## 2016-07-31 DIAGNOSIS — R682 Dry mouth, unspecified: Secondary | ICD-10-CM

## 2016-07-31 DIAGNOSIS — C099 Malignant neoplasm of tonsil, unspecified: Secondary | ICD-10-CM

## 2016-07-31 DIAGNOSIS — K117 Disturbances of salivary secretion: Secondary | ICD-10-CM

## 2016-07-31 NOTE — Telephone Encounter (Signed)
Please remind patient all future medication refills through PCP. No further refills after this prescription

## 2016-08-02 NOTE — Progress Notes (Signed)
Mr. Ashby Dawes. Dinger follow up for left tonsil carcinoma.  Weight: Wt Readings from Last 3 Encounters:  08/05/16 172 lb 9.6 oz (78.3 kg)  02/05/16 171 lb 4.8 oz (77.7 kg)  08/14/15 171 lb 8 oz (77.8 kg)   Appetite:Good Taste:Little of his taste is coming back since his last visit. Nausea:None Swallowing issues/swallowing pain:Having the feeling of a pull to the left side and tightness and it hurts. Pain:No BP 131/87   Pulse 61   Temp 97.7 F (36.5 C) (Oral)   Resp 18   Ht 5\' 10"  (1.778 m)   Wt 172 lb 9.6 oz (78.3 kg)   SpO2 100%   BMI 24.77 kg/m

## 2016-08-05 ENCOUNTER — Encounter: Payer: Self-pay | Admitting: Radiation Oncology

## 2016-08-05 ENCOUNTER — Ambulatory Visit: Payer: 59

## 2016-08-05 ENCOUNTER — Ambulatory Visit
Admission: RE | Admit: 2016-08-05 | Discharge: 2016-08-05 | Disposition: A | Discharge status: Home / Self Care | Payer: 59 | Source: Ambulatory Visit | Attending: Radiation Oncology | Admitting: Radiation Oncology

## 2016-08-05 VITALS — BP 131/87 | HR 61 | Temp 97.7°F | Resp 18 | Ht 70.0 in | Wt 172.6 lb

## 2016-08-05 DIAGNOSIS — Z9221 Personal history of antineoplastic chemotherapy: Secondary | ICD-10-CM | POA: Diagnosis not present

## 2016-08-05 DIAGNOSIS — C099 Malignant neoplasm of tonsil, unspecified: Secondary | ICD-10-CM | POA: Insufficient documentation

## 2016-08-05 DIAGNOSIS — Z880 Allergy status to penicillin: Secondary | ICD-10-CM | POA: Diagnosis not present

## 2016-08-05 DIAGNOSIS — E039 Hypothyroidism, unspecified: Secondary | ICD-10-CM | POA: Diagnosis not present

## 2016-08-05 DIAGNOSIS — N189 Chronic kidney disease, unspecified: Secondary | ICD-10-CM | POA: Insufficient documentation

## 2016-08-05 DIAGNOSIS — K219 Gastro-esophageal reflux disease without esophagitis: Secondary | ICD-10-CM | POA: Insufficient documentation

## 2016-08-05 DIAGNOSIS — Z85810 Personal history of malignant neoplasm of tongue: Secondary | ICD-10-CM | POA: Diagnosis not present

## 2016-08-05 DIAGNOSIS — I129 Hypertensive chronic kidney disease with stage 1 through stage 4 chronic kidney disease, or unspecified chronic kidney disease: Secondary | ICD-10-CM | POA: Diagnosis not present

## 2016-08-05 DIAGNOSIS — Z85528 Personal history of other malignant neoplasm of kidney: Secondary | ICD-10-CM | POA: Insufficient documentation

## 2016-08-05 DIAGNOSIS — K117 Disturbances of salivary secretion: Secondary | ICD-10-CM | POA: Diagnosis not present

## 2016-08-05 DIAGNOSIS — Z905 Acquired absence of kidney: Secondary | ICD-10-CM | POA: Diagnosis not present

## 2016-08-05 DIAGNOSIS — Z79899 Other long term (current) drug therapy: Secondary | ICD-10-CM | POA: Insufficient documentation

## 2016-08-05 DIAGNOSIS — Z823 Family history of stroke: Secondary | ICD-10-CM | POA: Insufficient documentation

## 2016-08-05 DIAGNOSIS — R682 Dry mouth, unspecified: Secondary | ICD-10-CM | POA: Insufficient documentation

## 2016-08-05 DIAGNOSIS — Z8042 Family history of malignant neoplasm of prostate: Secondary | ICD-10-CM | POA: Diagnosis not present

## 2016-08-05 DIAGNOSIS — Z923 Personal history of irradiation: Secondary | ICD-10-CM | POA: Insufficient documentation

## 2016-08-05 NOTE — Addendum Note (Signed)
Encounter addended by: Malena Edman, RN on: 08/05/2016  9:07 AM<BR>    Actions taken: Charge Capture section accepted

## 2016-08-05 NOTE — Progress Notes (Signed)
Radiation Oncology         [phone] ________________________________  Name: MOSA HILLIER MRN: [id number]  Date: 08/05/2016  DOB: 06-06-1966  Follow-Up Visit Note  CC: Shirline Frees, MD  [name], MD  Diagnosis:   Squamous cell carcinoma of the left tonsil with history of renal cell carcinoma status post nephrectomy  Interval Since Last Radiation: 4 years, 3 months  03/30/12-05/15/12: Radiation to the left tonsil   Narrative: Mr. Kuyper is a pleasant patient with a history renal cell carcinoma of the right kidney who underwent right laparoscopic nephrectomy in 2013. He has been followed in surveillance with Dr. Alinda Money. He also was diagnosed with squamous cell carcinoma of the left tonsil in 2013 and had systemic concurrent chemotherapy with radiotherapy which he completed in September 2013. He's been followed in surveillance every 6 months with Dr. Lisbeth Renshaw, every 6 months with thin history, and comes today for follow-up.  On review of systems, the patient reports that he is doing well overall. He reports that he continues to have dry mouth and uses allergen 3-4 times per day. He denies any chest pain, shortness of breath, cough, fevers, chills, night sweats, unintended weight changes. He denies any bowel or bladder disturbances, and denies abdominal pain, nausea or vomiting. He denies any new musculoskeletal or joint aches or pains. A complete review of systems is obtained and is otherwise negative.  Past Medical History:  Past Medical History:  Diagnosis Date  . Allergy    pcns  . Cancer ([place]) 02/17/12 bx   left tonsil squamous cell ca,HPV positive, ,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 radiation and chemo 05-13-12(Dr. Lamonte Sakai)  . Chronic kidney disease    right renal mass 7.3x6.7x6.8cm renal cell ca  . Dehydration 05/13/2012  . Difficult intubation    POSSIBLE DIFFICULT INTUBATION--S/P RADIATION FOR CANCER LEFT TONSIL--EATING BUT THROAT STILL SORE AND PT HAS THICK MUCUS  .  Diverticula of colon 03/10/12   multiple rectosigmoid colonic diverticula/ ct abd/pelvis  . GERD (gastroesophageal reflux disease)   . History of radiation therapy 03/30/12-05/15/12   left tonsil  . HTN (hypertension) 06-25-12   at first start of chemo for squamous cell cancer of neck-left  . Hypothyroid 02/15/2013  . Pain    JOINT PAINS UPPER BODY AFTER RADIATION TREATMENTS  . Phlegm in throat 05/13/2012  . Renal cell carcinoma ([place]) 09/09/2012  . Renal mass 03/30/2012  . Tonsil cancer ([place]) 02/2012   SCCa of Left tonsil-S/P biopsy on 02/17/12.    Past Surgical History: Past Surgical History:  Procedure Laterality Date  . DIRECT LARYNGOSCOPY     S/P DL, Biopsy-Dr. Constance Holster. Pathology postive for SCCa of Left Tonsil  . GASTROSTOMY TUBE PLACEMENT  03/02/12  . INGUINAL HERNIA REPAIR Left 11/04/2012   Procedure: HERNIA REPAIR INGUINAL ADULT;  Surgeon: Joyice Faster. Cornett, MD;  Location: [facility];  Service: General;  Laterality: Left;  . INSERTION OF MESH Left 11/04/2012   Procedure: INSERTION OF MESH;  Surgeon: Joyice Faster. Cornett, MD;  Location: [facility];  Service: General;  Laterality: Left;  . LAPAROSCOPIC NEPHRECTOMY  06/29/2012   Procedure: LAPAROSCOPIC NEPHRECTOMY;  Surgeon: Dutch Gray, MD;  Location: [facility];  Service: Urology;  Laterality: Right;  . MULTIPLE TOOTH EXTRACTIONS  03/04/12   with DR. Mohorn  . PEG TUBE PLACEMENT  06-25-12   remains in place abdomen-not using at present.  . PEG TUBE REMOVAL  10/2012  . tonsil biopsy  02/17/12   SCCa left tomnsil,HPV positive,spread to  l cervical node  . WISDOM TOOTH EXTRACTION      Social History:  Social History   Social History  . Marital status: Married    Spouse name: N/A  . Number of children: 2  . Years of education: N/A   Occupational History  .  [employer] Technical brewer   Social History Main Topics  . Smoking status: Never Smoker  . Smokeless tobacco: Never Used  . Alcohol use No     Comment: Never drank alocohol.  . Drug  use: No  . Sexual activity: Yes   Other Topics Concern  . Not on file   Social History Narrative   The patient is married and has 2 daughters.   Patient has never been a smoker.   Patient denies use of smokeless tobacco.   Patient has never drank alcohol.  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  Family History: Family History  Problem Relation Age of Onset  . Stroke Father   . Cancer Father     Prostate     ALLERGIES:  is allergic to banana and penicillins.  Meds: Current Outpatient Prescriptions  Medication Sig Dispense Refill  . levothyroxine (SYNTHROID, LEVOTHROID) 75 MCG tablet Take 1 tablet (75 mcg total) by mouth daily before breakfast. 90 tablet 3  . pilocarpine (SALAGEN) 5 MG tablet TAKE 1 TABLET BY MOUTH 3 TIMES DAILY 90 tablet 0   No current facility-administered medications for this encounter.     Physical Findings:  height is 5\' 10"  (1.778 m) and weight is 172 lb 9.6 oz (78.3 kg). His oral temperature is 97.7 F (36.5 C). His blood pressure is 131/87 and his pulse is 61. His respiration is 18 and oxygen saturation is 100%.  In general this is a well appearing Caucasian male in no acute distress. He is alert and oriented x4 and appropriate throughout the examination. HEENT reveals that the patient is normocephalic, atraumatic. EOMs are intact. PERRLA. Evaluation of the oropharynx of an indirect mirror does not reveal any lesions of the posterior oropharynx along the tonsillar region, or at the base of tongue. Skin is intact without any evidence of gross lesions. Cardiovascular exam reveals a regular rate and rhythm, no clicks rubs or murmurs are auscultated. Chest is clear to auscultation bilaterally. Lymphatic assessment is performed and does not reveal any adenopathy in the cervical, supraclavicular, axillary, or inguinal chains. Abdomen has active bowel sounds in all quadrants and is intact. The abdomen is soft, non tender, non distended. Lower  extremities are negative for pretibial pitting edema, deep calf tenderness, cyanosis or clubbing.   Lab Findings: Lab Results  Component Value Date   WBC 4.0 05/17/2015   HGB 15.8 05/17/2015   HCT 46.4 05/17/2015   MCV 91.0 05/17/2015   PLT 176 05/17/2015     Radiographic Findings: No results found.  Impression/Plan: 1. Stage IVA cT1N2aM0, squamous cell carcinoma of the left tonsil.The patient appears to be clinically without evidence of disease. He will return in 6 months time for continued evaluation, he will contact us sooner with questions or concerns. He had a normal TSH level of 3.96 on 06/13/2016, and we will continue to have his Synthroid refill by his primary provider. 2. History of  pT1b renal cell carcinoma of the right kidney. The patient is awaiting the results of the CT scan performed at Dr. Lynne Logan office, we have contacted Dr. Lynne Logan office, but his nurses have indicated that Dr. Alinda Money  has not had a chance to review this film, but they would be contacting him soon. We will continue to follow this expectantly. 3.  Dry mouth secondary to radiation treatment of #1. The patient is counseled on the use of biopsy as well as the Salagen that he is currently using. We will continue to follow this expectantly that he doesn't work of any progressive symptoms.     Carola Rhine, PAC

## 2016-09-20 DIAGNOSIS — Z85818 Personal history of malignant neoplasm of other sites of lip, oral cavity, and pharynx: Secondary | ICD-10-CM

## 2016-09-20 DIAGNOSIS — H903 Sensorineural hearing loss, bilateral: Secondary | ICD-10-CM | POA: Insufficient documentation

## 2016-09-20 DIAGNOSIS — Z923 Personal history of irradiation: Secondary | ICD-10-CM

## 2016-09-20 HISTORY — DX: Personal history of irradiation: Z92.3

## 2016-09-20 HISTORY — DX: Personal history of malignant neoplasm of other sites of lip, oral cavity, and pharynx: Z85.818

## 2016-09-20 HISTORY — DX: Sensorineural hearing loss, bilateral: H90.3

## 2016-10-10 ENCOUNTER — Other Ambulatory Visit: Payer: Self-pay | Admitting: Hematology and Oncology

## 2016-10-10 DIAGNOSIS — C099 Malignant neoplasm of tonsil, unspecified: Secondary | ICD-10-CM

## 2016-10-10 DIAGNOSIS — R682 Dry mouth, unspecified: Secondary | ICD-10-CM

## 2016-10-10 DIAGNOSIS — K117 Disturbances of salivary secretion: Secondary | ICD-10-CM

## 2017-02-05 NOTE — Progress Notes (Signed)
Bobby Gonzalez. [place] 52 y.o. man with  left tonsil carcinoma radiation completed 05-15-12, 6 month FU.   Weight: Wt Readings from Last 3 Encounters:  02/10/17 175 lb 3.2 oz (79.5 kg)  08/05/16 172 lb 9.6 oz (78.3 kg)  02/05/16 171 lb 4.8 oz (77.7 kg)   Appetite:Good eating two meals daily. Taste:Can taste his food, taste is not completely back. Nausea:No Swallowing issues/swallowing pain:Having problems swallowing while eating and drinking, has to drink while eating. Pain:None BP 130/85   Pulse (!) 54   Temp 98 F (36.7 C) (Oral)   Resp 18   Ht 5\' 10"  (1.778 m)   Wt 175 lb 3.2 oz (79.5 kg)   SpO2 100%   BMI 25.14 kg/m

## 2017-02-06 ENCOUNTER — Ambulatory Visit: Admission: RE | Admit: 2017-02-06 | Payer: 59 | Source: Ambulatory Visit | Admitting: Radiation Oncology

## 2017-02-10 ENCOUNTER — Ambulatory Visit
Admission: RE | Admit: 2017-02-10 | Discharge: 2017-02-10 | Disposition: A | Discharge status: Home / Self Care | Payer: 59 | Source: Ambulatory Visit | Attending: Radiation Oncology | Admitting: Radiation Oncology

## 2017-02-10 ENCOUNTER — Telehealth: Payer: Self-pay | Admitting: *Deleted

## 2017-02-10 ENCOUNTER — Encounter: Payer: Self-pay | Admitting: Radiation Oncology

## 2017-02-10 VITALS — BP 130/85 | HR 54 | Temp 98.0°F | Resp 18 | Ht 70.0 in | Wt 175.2 lb

## 2017-02-10 DIAGNOSIS — C099 Malignant neoplasm of tonsil, unspecified: Secondary | ICD-10-CM | POA: Insufficient documentation

## 2017-02-10 DIAGNOSIS — M542 Cervicalgia: Secondary | ICD-10-CM | POA: Diagnosis not present

## 2017-02-10 DIAGNOSIS — R682 Dry mouth, unspecified: Secondary | ICD-10-CM | POA: Diagnosis not present

## 2017-02-10 DIAGNOSIS — Z85528 Personal history of other malignant neoplasm of kidney: Secondary | ICD-10-CM | POA: Insufficient documentation

## 2017-02-10 DIAGNOSIS — Z9221 Personal history of antineoplastic chemotherapy: Secondary | ICD-10-CM | POA: Diagnosis not present

## 2017-02-10 DIAGNOSIS — K117 Disturbances of salivary secretion: Secondary | ICD-10-CM

## 2017-02-10 DIAGNOSIS — C641 Malignant neoplasm of right kidney, except renal pelvis: Secondary | ICD-10-CM

## 2017-02-10 DIAGNOSIS — Z923 Personal history of irradiation: Secondary | ICD-10-CM | POA: Insufficient documentation

## 2017-02-10 DIAGNOSIS — Z9889 Other specified postprocedural states: Secondary | ICD-10-CM | POA: Insufficient documentation

## 2017-02-10 DIAGNOSIS — E039 Hypothyroidism, unspecified: Secondary | ICD-10-CM | POA: Diagnosis not present

## 2017-02-10 DIAGNOSIS — Z79899 Other long term (current) drug therapy: Secondary | ICD-10-CM | POA: Diagnosis not present

## 2017-02-10 DIAGNOSIS — Z823 Family history of stroke: Secondary | ICD-10-CM | POA: Insufficient documentation

## 2017-02-10 DIAGNOSIS — Z85818 Personal history of malignant neoplasm of other sites of lip, oral cavity, and pharynx: Secondary | ICD-10-CM | POA: Diagnosis not present

## 2017-02-10 DIAGNOSIS — Z08 Encounter for follow-up examination after completed treatment for malignant neoplasm: Secondary | ICD-10-CM | POA: Diagnosis not present

## 2017-02-10 DIAGNOSIS — Z8042 Family history of malignant neoplasm of prostate: Secondary | ICD-10-CM | POA: Insufficient documentation

## 2017-02-10 LAB — BASIC METABOLIC PANEL
Anion Gap: 6 mEq/L (ref 3–11)
BUN: 13.4 mg/dL (ref 7.0–26.0)
CO2: 28 mEq/L (ref 22–29)
Calcium: 9.2 mg/dL (ref 8.4–10.4)
Chloride: 106 mEq/L (ref 98–109)
Creatinine: 1.2 mg/dL (ref 0.7–1.3)
EGFR: 68 mL/min/{1.73_m2} — ABNORMAL LOW (ref >90)
Glucose: 98 mg/dl (ref 70–140)
Potassium: 4.7 mEq/L (ref 3.5–5.1)
Sodium: 140 mEq/L (ref 136–145)

## 2017-02-10 LAB — TSH: TSH: 1.55 m(IU)/L (ref 0.320–4.118)

## 2017-02-10 MED ORDER — MUGARD MT LIQD: 5.0000 mL | Freq: Four times a day (QID) | OROMUCOSAL | 12 refills | Status: DC | PRN

## 2017-02-10 NOTE — Addendum Note (Signed)
Encounter addended by: Malena Edman, RN on: 02/10/2017  3:55 PM<BR>    Actions taken: Charge Capture section accepted

## 2017-02-10 NOTE — Progress Notes (Addendum)
Radiation Oncology         [phone] ________________________________  Name: Bobby Gonzalez MRN: [id number]  Date: 02/10/2017  DOB: 06/23/1966  Follow-Up Visit Note  CC: Shirline Frees, MD  [name], MD  Diagnosis:   Squamous cell carcinoma of the left tonsil with history of renal cell carcinoma status post nephrectomy  Interval Since Last Radiation: 4 years, 9 months  03/30/12-05/15/12: Radiation to the left tonsil   Narrative: Bobby Gonzalez is a pleasant patient with a history renal cell carcinoma of the right kidney who underwent right laparoscopic nephrectomy in 2013. He has been followed in surveillance with Dr. Alinda Money. He also was diagnosed with squamous cell carcinoma of the left tonsil in 2013 and had systemic concurrent chemotherapy with radiotherapy which he completed in September 2013. He's been followed in surveillance and comes today for routine evaluation.   On review of systems, the patient reports that he is doing well overall. He continues to drink lots of fluids when he eats, and reports this is stable. It's been about 8 months since his last visit with his PCP to check his TSH. He reports he has been having some trouble with hearing and is lip reading regularly. He had an evaluation with his ENT provider and audiology assessment showed changes in his hearing to a level in which he was offered hearing aides. He has not pursued this but is considering this now. He reports he has also noticed fullness along the right neck and episodes 3-4 times since his last visit where he has pain that shoots up the right neck. He reports this has come on in the last 2-4 months,  and persistently has noted sensitivity of his left scalp above his ear. Massaging this seems to help. He denies any recent injury or neck pain otherwise. He  denies any chest pain, shortness of breath, cough, fevers, chills, night sweats, unintended weight changes. He denies any bowel or bladder disturbances, and  denies abdominal pain, nausea or vomiting. He denies any new musculoskeletal or joint aches or pains, new skin lesions or concerns. A complete review of systems is obtained and is otherwise negative.  Past Medical History:  Past Medical History:  Diagnosis Date  . Allergy    pcns  . Cancer ([place]) 02/17/12 bx   left tonsil squamous cell ca,HPV positive, ,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 radiation and chemo 05-13-12(Dr. Lamonte Sakai)  . Chronic kidney disease    right renal mass 7.3x6.7x6.8cm renal cell ca  . Dehydration 05/13/2012  . Difficult intubation    POSSIBLE DIFFICULT INTUBATION--S/P RADIATION FOR CANCER LEFT TONSIL--EATING BUT THROAT STILL SORE AND PT HAS THICK MUCUS  . Diverticula of colon 03/10/12   multiple rectosigmoid colonic diverticula/ ct abd/pelvis  . GERD (gastroesophageal reflux disease)   . History of radiation therapy 03/30/12-05/15/12   left tonsil  . HTN (hypertension) 06-25-12   at first start of chemo for squamous cell cancer of neck-left  . Hypothyroid 02/15/2013  . Pain    JOINT PAINS UPPER BODY AFTER RADIATION TREATMENTS  . Phlegm in throat 05/13/2012  . Renal cell carcinoma ([place]) 09/09/2012  . Renal mass 03/30/2012  . Tonsil cancer ([place]) 02/2012   SCCa of Left tonsil-S/P biopsy on 02/17/12.    Past Surgical History: Past Surgical History:  Procedure Laterality Date  . DIRECT LARYNGOSCOPY     S/P DL, Biopsy-Dr. Constance Holster. Pathology postive for SCCa of Left Tonsil  . GASTROSTOMY TUBE PLACEMENT  03/02/12  . INGUINAL HERNIA  REPAIR Left 11/04/2012   Procedure: HERNIA REPAIR INGUINAL ADULT;  Surgeon: Joyice Faster. Cornett, MD;  Location: [facility];  Service: General;  Laterality: Left;  . INSERTION OF MESH Left 11/04/2012   Procedure: INSERTION OF MESH;  Surgeon: Joyice Faster. Cornett, MD;  Location: [facility];  Service: General;  Laterality: Left;  . LAPAROSCOPIC NEPHRECTOMY  06/29/2012   Procedure: LAPAROSCOPIC NEPHRECTOMY;  Surgeon: Dutch Gray, MD;  Location: [facility];  Service: Urology;   Laterality: Right;  . MULTIPLE TOOTH EXTRACTIONS  03/04/12   with DR. Mohorn  . PEG TUBE PLACEMENT  06-25-12   remains in place abdomen-not using at present.  . PEG TUBE REMOVAL  10/2012  . tonsil biopsy  02/17/12   SCCa left tomnsil,HPV positive,spread to l cervical node  . WISDOM TOOTH EXTRACTION      Social History:  Social History   Social History  . Marital status: Married    Spouse name: N/A  . Number of children: 2  . Years of education: N/A   Occupational History  .  [employer] Technical brewer   Social History Main Topics  . Smoking status: Never Smoker  . Smokeless tobacco: Never Used  . Alcohol use No     Comment: Never drank alocohol.  . Drug use: No  . Sexual activity: Yes   Other Topics Concern  . Not on file   Social History Narrative   The patient is married and has 2 daughters.   Patient has never been a smoker.   Patient denies use of smokeless tobacco.   Patient has never drank alcohol.  The patient lives in [place] and has a [place] as well.  Family History: Family History  Problem Relation Age of Onset  . Stroke Father   . Cancer Father        Prostate     ALLERGIES:  is allergic to banana and penicillins.  Meds: Current Outpatient Prescriptions  Medication Sig Dispense Refill  . levothyroxine (SYNTHROID, LEVOTHROID) 75 MCG tablet Take 1 tablet (75 mcg total) by mouth daily before breakfast. 90 tablet 3  . pilocarpine (SALAGEN) 5 MG tablet TAKE 1 TABLET BY MOUTH 3 TIMES DAILY 90 tablet 0   No current facility-administered medications for this encounter.     Physical Findings:  height is 5\' 10"  (1.778 m) and weight is 175 lb 3.2 oz (79.5 kg). His oral temperature is 98 F (36.7 C). His blood pressure is 130/85 and his pulse is 54 (abnormal). His respiration is 18 and oxygen saturation is 100%.  In general this is a well appearing Caucasian male in no acute distress. He is alert and oriented x4 and appropriate  throughout the examination. HEENT reveals that the patient is normocephalic, atraumatic. EOMs are intact. PERRLA. Evaluation of the mouth reveals normal appearance of his buccal mucosa and lips. No lesions are seen, the tongue is also intact and does not reveal any lesions inferiorly or of the lateral edges or surface of the tongue. Evaluation of the oropharynx  of an indirect mirror does not reveal any lesions of the posterior oropharynx along the tonsillar region, or at the base of tongue. Skin is intact without any evidence of gross lesions. Cardiovascular exam reveals a regular rate and rhythm, no clicks rubs or murmurs are auscultated. Chest is clear to auscultation bilaterally. Lymphatic assessment is performed and does not reveal any adenopathy in the cervical, supraclavicular, axillary, or inguinal chains. There is however assymetry consistent with  prior treatment, though this was not as pronounced during his last assessment. Abdomen has active bowel sounds in all quadrants and is intact. The abdomen is soft, non tender, non distended. Lower extremities are negative for pretibial pitting edema, deep calf tenderness, cyanosis or clubbing.   Lab Findings: Lab Results  Component Value Date   WBC 4.0 05/17/2015   HGB 15.8 05/17/2015   HCT 46.4 05/17/2015   MCV 91.0 05/17/2015   PLT 176 05/17/2015     Radiographic Findings: No results found.  Impression/Plan: 1. Stage IVA cT1N2aM0, squamous cell carcinoma of the left tonsil. The patient does not have any visible lesions on examination. Given the new onset of neck pain and fullness however we will proceed with CT of the neck with contrast to rule out disease. If this is negative, we will refer him to PT for evaluation of exercises to help with the tightness he's experiencing, and also plan to see him back in one year provided this CT workup is negative. He is in agreement. 2. Hypothyroidism as a result of radiotherapy. The patient will have his  TSH tested today and continued monitoring and management of his levothyroxine by his PCP. We will follow this expectantly, however if his levels are abnormal, will contact his PCP to determine if there is a need to adjust this. 3.  History of  pT1b renal cell carcinoma of the right kidney. The patient will continue in surveillance with Dr. Alinda Money for this. 4. Dry mouth secondary to radiation treatment of #1.The patient continues salagen as well and we will also try Mugard. I have called this into a specialty pharmacy and ask that this be mailed to him. He will keep [place] informed of his progress with this.     Carola Rhine, PAC

## 2017-02-10 NOTE — Telephone Encounter (Signed)
CALLED PATIENT TO INFORM OF CT FOR 02-12-17- ARRIVAL TIME - 8:15 AM @ [facility], PT. TO HAVE CLEAR LIQUIDS ONLY - 4 HRS. PRIOR TO TEST, LVM FOR A RETURN CALL

## 2017-02-11 ENCOUNTER — Encounter: Payer: Self-pay | Admitting: *Deleted

## 2017-02-11 NOTE — Progress Notes (Signed)
5 Spoke with Mr. Rio to let him know that his TSH lab result was within normal range.  Asked to drink a lot of water after his neck scan and he said he would. Faxed lab result to PCP Samara Snide, M.D.

## 2017-02-12 ENCOUNTER — Ambulatory Visit (HOSPITAL_COMMUNITY): Payer: 59

## 2017-02-25 ENCOUNTER — Encounter (HOSPITAL_COMMUNITY): Payer: Self-pay

## 2017-02-25 ENCOUNTER — Ambulatory Visit (HOSPITAL_COMMUNITY)
Admission: RE | Admit: 2017-02-25 | Discharge: 2017-02-25 | Disposition: A | Discharge status: Home / Self Care | Payer: 59 | Source: Ambulatory Visit | Attending: Radiation Oncology | Admitting: Radiation Oncology

## 2017-02-25 DIAGNOSIS — C099 Malignant neoplasm of tonsil, unspecified: Secondary | ICD-10-CM | POA: Insufficient documentation

## 2017-02-25 DIAGNOSIS — Z923 Personal history of irradiation: Secondary | ICD-10-CM | POA: Diagnosis not present

## 2017-02-25 MED ORDER — IOPAMIDOL (ISOVUE-300) INJECTION 61%
75.0000 mL | Freq: Once | INTRAVENOUS | Status: AC | PRN
  Administered 2017-02-25: 75 mL via INTRAVENOUS

## 2017-02-25 MED ORDER — IOPAMIDOL (ISOVUE-300) INJECTION 61%
INTRAVENOUS | Status: AC
  Administered 2017-02-25: 75 mL via INTRAVENOUS
  Filled 2017-02-25: qty 75

## 2017-02-28 ENCOUNTER — Encounter: Payer: Self-pay | Admitting: *Deleted

## 2017-02-28 NOTE — Progress Notes (Signed)
[place] left message that results from CT scan were good and his next appointment would stay as scheduled.  Asked to call if he has any questions ask for [employer] for Dr. Lisbeth Renshaw  nurse Jarrett Ables at [phone] Radiation oncology at the [facility].

## 2017-03-19 ENCOUNTER — Telehealth: Payer: Self-pay | Admitting: Radiation Oncology

## 2017-03-19 NOTE — Telephone Encounter (Signed)
LM for the pt to call me back regarding his experience in using mugard for dry mouth.

## 2017-07-07 DIAGNOSIS — H524 Presbyopia: Secondary | ICD-10-CM | POA: Diagnosis not present

## 2017-07-07 DIAGNOSIS — H52223 Regular astigmatism, bilateral: Secondary | ICD-10-CM | POA: Diagnosis not present

## 2017-07-07 DIAGNOSIS — H5203 Hypermetropia, bilateral: Secondary | ICD-10-CM | POA: Diagnosis not present

## 2017-07-09 DIAGNOSIS — Z1211 Encounter for screening for malignant neoplasm of colon: Secondary | ICD-10-CM | POA: Diagnosis not present

## 2017-07-09 DIAGNOSIS — L989 Disorder of the skin and subcutaneous tissue, unspecified: Secondary | ICD-10-CM | POA: Diagnosis not present

## 2017-07-09 DIAGNOSIS — Z125 Encounter for screening for malignant neoplasm of prostate: Secondary | ICD-10-CM | POA: Diagnosis not present

## 2017-07-09 DIAGNOSIS — K219 Gastro-esophageal reflux disease without esophagitis: Secondary | ICD-10-CM | POA: Diagnosis not present

## 2017-07-09 DIAGNOSIS — C642 Malignant neoplasm of left kidney, except renal pelvis: Secondary | ICD-10-CM | POA: Diagnosis not present

## 2017-07-09 DIAGNOSIS — Z23 Encounter for immunization: Secondary | ICD-10-CM | POA: Diagnosis not present

## 2017-07-09 DIAGNOSIS — C099 Malignant neoplasm of tonsil, unspecified: Secondary | ICD-10-CM | POA: Diagnosis not present

## 2017-07-09 DIAGNOSIS — E039 Hypothyroidism, unspecified: Secondary | ICD-10-CM | POA: Diagnosis not present

## 2017-07-11 DIAGNOSIS — C641 Malignant neoplasm of right kidney, except renal pelvis: Secondary | ICD-10-CM | POA: Diagnosis not present

## 2017-07-14 DIAGNOSIS — R109 Unspecified abdominal pain: Secondary | ICD-10-CM | POA: Diagnosis not present

## 2017-07-14 DIAGNOSIS — R918 Other nonspecific abnormal finding of lung field: Secondary | ICD-10-CM | POA: Diagnosis not present

## 2017-07-14 DIAGNOSIS — C641 Malignant neoplasm of right kidney, except renal pelvis: Secondary | ICD-10-CM | POA: Diagnosis not present

## 2017-07-18 DIAGNOSIS — C641 Malignant neoplasm of right kidney, except renal pelvis: Secondary | ICD-10-CM | POA: Diagnosis not present

## 2017-09-25 ENCOUNTER — Encounter: Payer: Self-pay | Admitting: Family Medicine

## 2018-01-29 NOTE — Progress Notes (Signed)
Mr. Ashby Dawes. Craw 52 y.o. follow up for left tonsil carcinoma radiation completed 05-15-12, one year FU.    Weight: Wt Readings from Last 3 Encounters:  02/11/18 173 lb 9.6 oz (78.7 kg)  02/10/17 175 lb 3.2 oz (79.5 kg)  08/05/16 172 lb 9.6 oz (78.3 kg)   Appetite: Good has been eating less since it has the weather been so hot. Taste:Okay not back completely Nausea:No Swallowing issues/swallowing pain:No problems as long as the food is moist. Has problems swallowing tablets. Pain:No BP 122/85 (BP Location: Right Arm, Patient Position: Sitting, Cuff Size: Normal)   Pulse (!) 59   Temp 97.7 F (36.5 C) (Oral)   Resp 18   Ht 5\' 10"  (1.778 m)   Wt 173 lb 9.6 oz (78.7 kg)   SpO2 99%   BMI 24.91 kg/m

## 2018-02-11 ENCOUNTER — Encounter: Payer: Self-pay | Admitting: Radiation Oncology

## 2018-02-11 ENCOUNTER — Ambulatory Visit
Admission: RE | Admit: 2018-02-11 | Discharge: 2018-02-11 | Disposition: A | Discharge status: Home / Self Care | Payer: Self-pay | Source: Ambulatory Visit | Attending: Radiation Oncology | Admitting: Radiation Oncology

## 2018-02-11 ENCOUNTER — Encounter: Payer: Self-pay | Admitting: Gastroenterology

## 2018-02-11 ENCOUNTER — Other Ambulatory Visit: Payer: Self-pay

## 2018-02-11 VITALS — BP 122/85 | HR 59 | Temp 97.7°F | Resp 18 | Ht 70.0 in | Wt 173.6 lb

## 2018-02-11 DIAGNOSIS — Z905 Acquired absence of kidney: Secondary | ICD-10-CM | POA: Insufficient documentation

## 2018-02-11 DIAGNOSIS — Z85818 Personal history of malignant neoplasm of other sites of lip, oral cavity, and pharynx: Secondary | ICD-10-CM | POA: Insufficient documentation

## 2018-02-11 DIAGNOSIS — R1310 Dysphagia, unspecified: Secondary | ICD-10-CM | POA: Insufficient documentation

## 2018-02-11 DIAGNOSIS — K219 Gastro-esophageal reflux disease without esophagitis: Secondary | ICD-10-CM | POA: Insufficient documentation

## 2018-02-11 DIAGNOSIS — N189 Chronic kidney disease, unspecified: Secondary | ICD-10-CM | POA: Insufficient documentation

## 2018-02-11 DIAGNOSIS — E039 Hypothyroidism, unspecified: Secondary | ICD-10-CM | POA: Insufficient documentation

## 2018-02-11 DIAGNOSIS — C099 Malignant neoplasm of tonsil, unspecified: Secondary | ICD-10-CM | POA: Insufficient documentation

## 2018-02-11 DIAGNOSIS — Z08 Encounter for follow-up examination after completed treatment for malignant neoplasm: Secondary | ICD-10-CM | POA: Insufficient documentation

## 2018-02-11 DIAGNOSIS — C641 Malignant neoplasm of right kidney, except renal pelvis: Secondary | ICD-10-CM

## 2018-02-11 DIAGNOSIS — Z9221 Personal history of antineoplastic chemotherapy: Secondary | ICD-10-CM | POA: Insufficient documentation

## 2018-02-11 DIAGNOSIS — K117 Disturbances of salivary secretion: Secondary | ICD-10-CM | POA: Insufficient documentation

## 2018-02-11 DIAGNOSIS — I129 Hypertensive chronic kidney disease with stage 1 through stage 4 chronic kidney disease, or unspecified chronic kidney disease: Secondary | ICD-10-CM | POA: Insufficient documentation

## 2018-02-11 DIAGNOSIS — Z85528 Personal history of other malignant neoplasm of kidney: Secondary | ICD-10-CM | POA: Insufficient documentation

## 2018-02-11 LAB — CMP (CANCER CENTER ONLY)
ALT: 18 U/L (ref 0–55)
AST: 21 U/L (ref 5–34)
Albumin: 4.3 g/dL (ref 3.5–5.0)
Alkaline Phosphatase: 61 U/L (ref 40–150)
Anion gap: 7 (ref 3–11)
BUN: 21 mg/dL (ref 7–26)
CO2: 30 mmol/L — ABNORMAL HIGH (ref 22–29)
Calcium: 9.5 mg/dL (ref 8.4–10.4)
Chloride: 102 mmol/L (ref 98–109)
Creatinine: 1.27 mg/dL (ref 0.70–1.30)
GFR, Est AFR Am: >60 mL/min (ref >60)
GFR, Estimated: >60 mL/min (ref >60)
Glucose, Bld: 97 mg/dL (ref 70–140)
Potassium: 4.7 mmol/L (ref 3.5–5.1)
Sodium: 139 mmol/L (ref 136–145)
Total Bilirubin: 0.6 mg/dL (ref 0.2–1.2)
Total Protein: 6.9 g/dL (ref 6.4–8.3)

## 2018-02-11 NOTE — Progress Notes (Signed)
Radiation Oncology         [phone] ________________________________  Name: Bobby Gonzalez MRN: [id number]  Date: 02/11/2018  DOB: 1965/10/23  Follow-Up Visit Note  CC: Shirline Frees, MD  [name], MD  Diagnosis:   Squamous cell carcinoma of the left tonsil with history of renal cell carcinoma status post right nephrectomy  Interval Since Last Radiation: 5 years, 11 months  03/30/12-05/15/12: Radiation to the left tonsil   Narrative: [name] is a pleasant patient with a history renal cell carcinoma of the right kidney who underwent right laparoscopic nephrectomy in 2013. He has been followed in surveillance with Dr. Alinda Money. He also was diagnosed with squamous cell carcinoma of the left tonsil in 2013 and had systemic concurrent chemotherapy with radiotherapy which he completed in September 2013. He's been followed in surveillance and comes today for routine evaluation.   On review of systems, the patient reports that he is doing well overall. He denies any chest pain, shortness of breath, cough, fevers, chills, night sweats, unintended weight changes. He denies any bowel or bladder disturbances, and denies abdominal pain, nausea or vomiting. He continues to have dry mouth and tells me that when he's outside mowing, he can drink up to 2 gallons of water a day as a result of the heat and his dry mouth. He is concerned given his solitary kidney. He also reports on occasion he has a hard time with swallowing pills or food. He thinks this is related to dry mouth as well.  He  denies any new musculoskeletal or joint aches or pains, new skin lesions or concerns. A complete review of systems is obtained and is otherwise negative.   Past Medical History:  Past Medical History:  Diagnosis Date  . Allergy    pcns  . Cancer ([place]) 02/17/12 bx   left tonsil squamous cell ca,HPV positive, ,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 radiation and chemo 05-13-12(Dr. Lamonte Sakai)  . Chronic kidney disease      right renal mass 7.3x6.7x6.8cm renal cell ca  . Dehydration 05/13/2012  . Difficult intubation    POSSIBLE DIFFICULT INTUBATION--S/P RADIATION FOR CANCER LEFT TONSIL--EATING BUT THROAT STILL SORE AND PT HAS THICK MUCUS  . Diverticula of colon 03/10/12   multiple rectosigmoid colonic diverticula/ ct abd/pelvis  . GERD (gastroesophageal reflux disease)   . History of radiation therapy 03/30/12-05/15/12   left tonsil  . HTN (hypertension) 06-25-12   at first start of chemo for squamous cell cancer of neck-left  . Hypothyroid 02/15/2013  . Pain    JOINT PAINS UPPER BODY AFTER RADIATION TREATMENTS  . Phlegm in throat 05/13/2012  . Renal cell carcinoma ([place]) 09/09/2012  . Renal mass 03/30/2012  . Tonsil cancer ([place]) 02/2012   SCCa of Left tonsil-S/P biopsy on 02/17/12.    Past Surgical History: Past Surgical History:  Procedure Laterality Date  . DIRECT LARYNGOSCOPY     S/P DL, Biopsy-Dr. Constance Holster. Pathology postive for SCCa of Left Tonsil  . GASTROSTOMY TUBE PLACEMENT  03/02/12  . INGUINAL HERNIA REPAIR Left 11/04/2012   Procedure: HERNIA REPAIR INGUINAL ADULT;  Surgeon: Joyice Faster. Cornett, MD;  Location: [facility];  Service: General;  Laterality: Left;  . INSERTION OF MESH Left 11/04/2012   Procedure: INSERTION OF MESH;  Surgeon: Joyice Faster. Cornett, MD;  Location: [facility];  Service: General;  Laterality: Left;  . LAPAROSCOPIC NEPHRECTOMY  06/29/2012   Procedure: LAPAROSCOPIC NEPHRECTOMY;  Surgeon: Dutch Gray, MD;  Location: [facility];  Service: Urology;  Laterality: Right;  . MULTIPLE TOOTH EXTRACTIONS  03/04/12   with DR. Mohorn  . PEG TUBE PLACEMENT  06-25-12   remains in place abdomen-not using at present.  . PEG TUBE REMOVAL  10/2012  . tonsil biopsy  02/17/12   SCCa left tomnsil,HPV positive,spread to l cervical node  . WISDOM TOOTH EXTRACTION      Social History:  Social History   Socioeconomic History  . Marital status: Married    Spouse name: Not on file  . Number of children: 2  . Years of  education: Not on file  . Highest education level: Not on file  Occupational History    Employer: [employer]    Comment: car [place]  . Financial resource strain: Not on file  . Food insecurity:    Worry: Not on file    Inability: Not on file  . Transportation needs:    Medical: Not on file    Non-medical: Not on file  Tobacco Use  . Smoking status: Never Smoker  . Smokeless tobacco: Never Used  Substance and Sexual Activity  . Alcohol use: No    Comment: Never drank alocohol.  . Drug use: No  . Sexual activity: Yes  Lifestyle  . Physical activity:    Days per week: Not on file    Minutes per session: Not on file  . Stress: Not on file  Relationships  . Social connections:    Talks on phone: Not on file    Gets together: Not on file    Attends religious service: Not on file    Active member of club or organization: Not on file    Attends meetings of clubs or organizations: Not on file    Relationship status: Not on file  . Intimate partner violence:    Fear of current or ex partner: Not on file    Emotionally abused: Not on file    Physically abused: Not on file    Forced sexual activity: Not on file  Other Topics Concern  . Not on file  Social History Narrative   The patient is married and has 2 daughters.   Patient has never been a smoker.   Patient denies use of smokeless tobacco.   Patient has never drank alcohol.  The patient lives in [place] and has a [place] as well.  Family History: Family History  Problem Relation Age of Onset  . Stroke Father   . Cancer Father        Prostate     ALLERGIES:  is allergic to banana and penicillins.  Meds: Current Outpatient Medications  Medication Sig Dispense Refill  . levothyroxine (SYNTHROID, LEVOTHROID) 75 MCG tablet Take 1 tablet (75 mcg total) by mouth daily before breakfast. 90 tablet 3  . Oral Wound Care Products [facility]) LIQD Use as directed 5 mLs in the mouth or  throat 4 (four) times daily as needed. 240 mL 12  . pilocarpine (SALAGEN) 5 MG tablet TAKE 1 TABLET BY MOUTH 3 TIMES DAILY 90 tablet 0   No current facility-administered medications for this encounter.     Physical Findings:  height is 5\' 10"  (1.778 m) and weight is 173 lb 9.6 oz (78.7 kg). His oral temperature is 97.7 F (36.5 C). His blood pressure is 122/85 and his pulse is 59 (abnormal). His respiration is 18 and oxygen saturation is 99%.  In general this is a well appearing Caucasian male in no acute distress. He  is alert and oriented x4 and appropriate throughout the examination. HEENT reveals that the patient is normocephalic, atraumatic. EOMs are intact. PERRLA. Evaluation of the mouth reveals normal appearance of his buccal mucosa and lips. No lesions are seen, the tongue is also intact and does not reveal any lesions inferiorly or of the lateral edges or surface of the tongue. Evaluation of the oropharynx  of an indirect mirror does not reveal any lesions of the posterior oropharynx along the tonsillar region, or at the base of tongue. Skin is intact without any evidence of gross lesions. Cardiovascular exam reveals a regular rate and rhythm, no clicks rubs or murmurs are auscultated. Chest is clear to auscultation bilaterally. Lymphatic assessment is performed and does not reveal any adenopathy in the cervical, supraclavicular, axillary, or inguinal chains. There is however assymetry consistent with prior treatment, though this was not as pronounced during his last assessment. Abdomen has active bowel sounds in all quadrants and is intact. The abdomen is soft, non tender, non distended. Lower extremities are negative for pretibial pitting edema, deep calf tenderness, cyanosis or clubbing.   Lab Findings: Lab Results  Component Value Date   WBC 4.0 05/17/2015   HGB 15.8 05/17/2015   HCT 46.4 05/17/2015   MCV 91.0 05/17/2015   PLT 176 05/17/2015     Radiographic Findings: No results  found.  Impression/Plan: 1. Stage IVA cT1N2aM0, squamous cell carcinoma of the left tonsil. The patient does not have any visible lesions on examination. We will follow up with him in 1 year's time or sooner otherwise for continued surveillance. 2. Hypothyroidism as a result of radiotherapy. We will follow up with the results of his Thyroid panel today and keep his PCP informed. He otherwise continues on 75 mcg of Levothyroxine per day and we will continue to follow his thyroid studies annually. 3.  History of  pT1b renal cell carcinoma of the right kidney. The patient will continue in surveillance with Dr. Alinda Money for this and remains NED. That being said his need to drink fluids due to his Xerostomia could lead to some concerns given that some days he drinks more than 2 gallons of water. If he has any abnormalities in kidney function I will discuss his case with Dr. Alinda Money and nephrology. 4. Xerostomia secondary to radiation treatment of #1.The patient was unable to obtain mugard and the manufacturer has stopped providing samples at an oncology pharmacy we previously worked with. Instead he will try OTC Xylimelts and Therabreath. I will also start looking into other supportive therapies to consider.  5. Intermittent difficulty swallowing. Although I believe this is most likely due to #4, with new supportive therapies we will follow this and if there is persistence despite improvement in xerostomia, we will ask him to return to ENT for more formal laryngoscopic evaluation, if this does not show anything and symptoms persist I would lean toward having him see GI.     Carola Rhine, PAC

## 2018-02-12 ENCOUNTER — Telehealth: Payer: Self-pay | Admitting: *Deleted

## 2018-02-12 LAB — THYROID PANEL WITH TSH
Free Thyroxine Index: 1.9 (ref 1.2–4.9)
T3 Uptake Ratio: 27 % (ref 24–39)
T4, Total: 7.1 ug/dL (ref 4.5–12.0)
TSH: 4.8 u[IU]/mL — ABNORMAL HIGH (ref 0.450–4.500)

## 2018-02-12 NOTE — Telephone Encounter (Signed)
CALLED PATIENT TO INFORM OF FU ON 02-17-19 @ 9:30 AM WITH ALISON PERKINS, SPOKE WITH PATIENT AND HE IS AWARE OF THIS APPT.

## 2018-02-13 ENCOUNTER — Other Ambulatory Visit: Payer: Self-pay | Admitting: Radiation Oncology

## 2018-02-13 DIAGNOSIS — E039 Hypothyroidism, unspecified: Secondary | ICD-10-CM

## 2018-02-19 ENCOUNTER — Encounter: Payer: Self-pay | Admitting: Radiation Oncology

## 2018-02-19 NOTE — Progress Notes (Signed)
I spoke with Dr. Posey Pronto at [facility] regarding the patient. He indicated that the patient should not be started on NSAIDs, ACEi, or ARB drugs due to his solitary kidney, and to send him to be evaluated by nephrology if his egfr was consistently below 60. Fortunately his egfr has been greater than 60. We will continue to follow up with his blood work annually.      Carola Rhine, PAC

## 2018-03-06 ENCOUNTER — Telehealth: Payer: Self-pay | Admitting: *Deleted

## 2018-03-06 NOTE — Telephone Encounter (Signed)
Called patient to inform of endocrinology appt. On Aug. 26- arrival time - 1:45 pm for 2 pm appt. with Dr. Dwyane Dee- address [address], ph. No. [phone], spoke with patient and he is aware of this appt.

## 2018-04-13 ENCOUNTER — Ambulatory Visit (AMBULATORY_SURGERY_CENTER): Payer: Self-pay

## 2018-04-13 VITALS — Ht 70.0 in | Wt 181.6 lb

## 2018-04-13 DIAGNOSIS — Z1211 Encounter for screening for malignant neoplasm of colon: Secondary | ICD-10-CM

## 2018-04-13 MED ORDER — NA SULFATE-K SULFATE-MG SULF 17.5-3.13-1.6 GM/177ML PO SOLN: 1.0000 | Freq: Once | ORAL | 0 refills | Status: AC

## 2018-04-13 NOTE — Progress Notes (Signed)
Denies allergies to eggs or soy products. Denies complication of anesthesia or sedation. Denies use of weight loss medication. Denies use of O2.   Emmi instructions declined.  

## 2018-04-27 ENCOUNTER — Ambulatory Visit: Payer: Self-pay | Admitting: Endocrinology

## 2018-04-27 ENCOUNTER — Ambulatory Visit (AMBULATORY_SURGERY_CENTER): Payer: No Typology Code available for payment source | Admitting: Gastroenterology

## 2018-04-27 ENCOUNTER — Encounter: Payer: Self-pay | Admitting: Gastroenterology

## 2018-04-27 VITALS — BP 135/72 | HR 55 | Temp 98.4°F | Resp 11 | Ht 70.0 in | Wt 176.0 lb

## 2018-04-27 DIAGNOSIS — K621 Rectal polyp: Secondary | ICD-10-CM | POA: Diagnosis not present

## 2018-04-27 DIAGNOSIS — D128 Benign neoplasm of rectum: Secondary | ICD-10-CM

## 2018-04-27 DIAGNOSIS — Z1211 Encounter for screening for malignant neoplasm of colon: Secondary | ICD-10-CM

## 2018-04-27 MED ORDER — SODIUM CHLORIDE 0.9 % IV SOLN: 500.0000 mL | Freq: Once | INTRAVENOUS | Status: DC

## 2018-04-27 NOTE — Progress Notes (Signed)
Report to PACU, RN, vss, BBS= Clear.  

## 2018-04-27 NOTE — Progress Notes (Signed)
Called to room to assist during endoscopic procedure.  Patient ID and intended procedure confirmed with present staff. Received instructions for my participation in the procedure from the performing physician.  

## 2018-04-27 NOTE — Progress Notes (Signed)
Pt's states no medical or surgical changes since previsit or office visit. 

## 2018-04-27 NOTE — Patient Instructions (Signed)
Impression/Recommendations:  Polyp handout given to patient. Diverticulosis handout given to patient. High fiber diet handout given to patient.  Repeat colonoscopy for surveillance.  Date to be determined after pathology results reviewed.  Continue present medications.  Await pathology results.  YOU HAD AN ENDOSCOPIC PROCEDURE TODAY AT [facility]:   Refer to the procedure report that was given to you for any specific questions about what was found during the examination.  If the procedure report does not answer your questions, please call your gastroenterologist to clarify.  If you requested that your care partner not be given the details of your procedure findings, then the procedure report has been included in a sealed envelope for you to review at your convenience later.  YOU SHOULD EXPECT: Some feelings of bloating in the abdomen. Passage of more gas than usual.  Walking can help get rid of the air that was put into your GI tract during the procedure and reduce the bloating. If you had a lower endoscopy (such as a colonoscopy or flexible sigmoidoscopy) you may notice spotting of blood in your stool or on the toilet paper. If you underwent a bowel prep for your procedure, you may not have a normal bowel movement for a few days.  Please Note:  You might notice some irritation and congestion in your nose or some drainage.  This is from the oxygen used during your procedure.  There is no need for concern and it should clear up in a day or so.  SYMPTOMS TO REPORT IMMEDIATELY:   Following lower endoscopy (colonoscopy or flexible sigmoidoscopy):  Excessive amounts of blood in the stool  Significant tenderness or worsening of abdominal pains  Swelling of the abdomen that is new, acute  Fever of 100F or higher   For urgent or emergent issues, a gastroenterologist can be reached at any hour by calling [phone].   DIET:  We do recommend a small meal at first, but then  you may proceed to your regular diet.  Drink plenty of fluids but you should avoid alcoholic beverages for 24 hours.  ACTIVITY:  You should plan to take it easy for the rest of today and you should NOT DRIVE or use heavy machinery until tomorrow (because of the sedation medicines used during the test).    FOLLOW UP: Our staff will call the number listed on your records the next business day following your procedure to check on you and address any questions or concerns that you may have regarding the information given to you following your procedure. If we do not reach you, we will leave a message.  However, if you are feeling well and you are not experiencing any problems, there is no need to return our call.  We will assume that you have returned to your regular daily activities without incident.  If any biopsies were taken you will be contacted by phone or by letter within the next 1-3 weeks.  Please call us at [phone] if you have not heard about the biopsies in 3 weeks.    SIGNATURES/CONFIDENTIALITY: You and/or your care partner have signed paperwork which will be entered into your electronic medical record.  These signatures attest to the fact that that the information above on your After Visit Summary has been reviewed and is understood.  Full responsibility of the confidentiality of this discharge information lies with you and/or your care-partner.

## 2018-04-27 NOTE — Op Note (Signed)
[facility] Patient Name: Bobby Gonzalez Procedure Date: 04/27/2018 2:06 PM MRN: [id number] Endoscopist: Ladene Artist , MD Age: 52 Referring MD:  Date of Birth: 1966-01-19 Gender: Male Account #: [id number] Procedure:                Colonoscopy Indications:              Screening for colorectal malignant neoplasm Medicines:                Monitored Anesthesia Care Procedure:                Pre-Anesthesia Assessment:                           - Prior to the procedure, a History and Physical                            was performed, and patient medications and                            allergies were reviewed. The patient's tolerance of                            previous anesthesia was also reviewed. The risks                            and benefits of the procedure and the sedation                            options and risks were discussed with the patient.                            All questions were answered, and informed consent                            was obtained. Prior Anticoagulants: The patient has                            taken no previous anticoagulant or antiplatelet                            agents. ASA Grade Assessment: II - A patient with                            mild systemic disease. After reviewing the risks                            and benefits, the patient was deemed in                            satisfactory condition to undergo the procedure.                           After obtaining informed consent, the colonoscope  was passed under direct vision. Throughout the                            procedure, the patient's blood pressure, pulse, and                            oxygen saturations were monitored continuously. The                            Model CF-HQ190L [id number]) scope was introduced                            through the anus and advanced to the the cecum,                            identified by  appendiceal orifice and ileocecal                            valve. The ileocecal valve, appendiceal orifice,                            and rectum were photographed. The quality of the                            bowel preparation was good. The colonoscopy was                            performed without difficulty. The patient tolerated                            the procedure well. Scope In: 2:10:23 PM Scope Out: 2:23:27 PM Scope Withdrawal Time: 0 hours 11 minutes 23 seconds  Total Procedure Duration: 0 hours 13 minutes 4 seconds  Findings:                 The perianal and digital rectal examinations were                            normal.                           Two sessile polyps were found in the rectum. The                            polyps were 5 mm in size. These polyps were removed                            with a cold biopsy forceps. Resection and retrieval                            were complete.                           Multiple small-mouthed diverticula were found in  the left colon. There was no evidence of                            diverticular bleeding.                           The exam was otherwise without abnormality on                            direct and retroflexion views. Complications:            No immediate complications. Estimated blood loss:                            None. Estimated Blood Loss:     Estimated blood loss: none. Impression:               - Two 5 mm polyps in the rectum, removed with a                            cold biopsy forceps. Resected and retrieved.                           - Mild diverticulosis in the left colon. There was                            no evidence of diverticular bleeding.                           - The examination was otherwise normal on direct                            and retroflexion views. Recommendation:           - Repeat colonoscopy in 5 years for surveillance if                             polyp(s) are precancerous, otherwise 10 years.                           - Patient has a contact number available for                            emergencies. The signs and symptoms of potential                            delayed complications were discussed with the                            patient. Return to normal activities tomorrow.                            Written discharge instructions were provided to the                            patient.                           -  High fiber diet.                           - Continue present medications.                           - Await pathology results. Ladene Artist, MD 04/27/2018 2:27:03 PM This report has been signed electronically.

## 2018-04-28 ENCOUNTER — Telehealth: Payer: Self-pay

## 2018-04-28 NOTE — Telephone Encounter (Signed)
Left message

## 2018-05-06 ENCOUNTER — Ambulatory Visit (INDEPENDENT_AMBULATORY_CARE_PROVIDER_SITE_OTHER): Payer: No Typology Code available for payment source | Admitting: Endocrinology

## 2018-05-06 ENCOUNTER — Encounter: Payer: Self-pay | Admitting: Endocrinology

## 2018-05-06 VITALS — BP 140/90 | HR 63 | Ht 70.0 in | Wt 178.2 lb

## 2018-05-06 DIAGNOSIS — E038 Other specified hypothyroidism: Secondary | ICD-10-CM

## 2018-05-06 LAB — T4, FREE: Free T4: 0.93 ng/dL (ref 0.60–1.60)

## 2018-05-06 LAB — TSH: TSH: 1.28 u[IU]/mL (ref 0.35–4.50)

## 2018-05-06 NOTE — Progress Notes (Signed)
Patient ID: Bobby Gonzalez, male   DOB: 1966-01-08, 52 y.o.   MRN: [id number]           Referring Provider: Shona Simpson, PA  Reason for Appointment:  Hypothyroidism, new visit    History of Present Illness:   Hypothyroidism was first diagnosed in 2014 after his radiation for tonsillar carcinoma  At the time of diagnosis he does not remember what symptoms he was having and not clear what his baseline TSH was He was told that because of radiation his thyroid function was decreased .          The patient has been treated with  50 mcg of levothyroxine initially and this was increased up to 75 mcg in 2015  With starting thyroid supplementation he did not think he had any change in his energy level  Currently the patient does not complain of any fatigue, dry skin, or recent weight gain He says that he tends to feel cold but this is persistent for the last few years  In 6/19 his oncologist checked his TSH and this was relatively higher at 4.8 His PCP has increased his levothyroxine up to 100 mcg Again with this dosage change he did not feel any different  He takes his thyroid supplement a couple of hours before breakfast in the morning consistently  [name] is as follows:  Wt Readings from Last 3 Encounters:  05/06/18 178 lb 3.2 oz (80.8 kg)  04/27/18 176 lb (79.8 kg)  04/13/18 181 lb 9.6 oz (82.4 kg)    Thyroid function results have been as follows:  Lab Results  Component Value Date   TSH 4.800 (H) 02/11/2018   TSH 1.550 02/10/2017   TSH 2.293 05/17/2015   TSH 2.165 02/03/2015   FREET4 1.37 02/03/2015   FREET4 1.29 05/13/2014   FREET4 1.46 08/13/2013   FREET4 1.34 06/18/2013     Past Medical History:  Diagnosis Date  . Allergy    pcns  . 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 radiation and chemo 05-13-12(Dr. Lamonte Sakai)  . Chronic kidney disease    right renal mass 7.3x6.7x6.8cm  renal cell ca  . Dehydration 05/13/2012  . Difficult intubation    POSSIBLE DIFFICULT INTUBATION--S/P RADIATION FOR CANCER LEFT TONSIL--EATING BUT THROAT STILL SORE AND PT HAS THICK MUCUS  . Diverticula of colon 03/10/12   multiple rectosigmoid colonic diverticula/ ct abd/pelvis  . GERD (gastroesophageal reflux disease)   . History of radiation therapy 03/30/12-05/15/12   left tonsil  . HTN (hypertension) 06-25-12   at first start of chemo for squamous cell cancer of neck-left  . Hypothyroid 02/15/2013  . Pain    JOINT PAINS UPPER BODY AFTER RADIATION TREATMENTS  . Phlegm in throat 05/13/2012  . Renal cell carcinoma ([place]) 09/09/2012  . Renal mass 03/30/2012  . Tonsil cancer ([place]) 02/2012   SCCa of Left tonsil-S/P biopsy on 02/17/12.    Past Surgical History:  Procedure Laterality Date  . DIRECT LARYNGOSCOPY     S/P DL, Biopsy-Dr. Constance Holster. Pathology postive for SCCa of Left Tonsil  . GASTROSTOMY TUBE PLACEMENT  03/02/12  . INGUINAL HERNIA REPAIR Left 11/04/2012   Procedure: HERNIA REPAIR INGUINAL ADULT;  Surgeon: Joyice Faster. Cornett, MD;  Location: [facility];  Service: General;  Laterality: Left;  . INSERTION OF MESH Left 11/04/2012   Procedure: INSERTION OF MESH;  Surgeon: Joyice Faster. Cornett, MD;  Location: [facility];  Service: General;  Laterality: Left;  . LAPAROSCOPIC NEPHRECTOMY  06/29/2012   Procedure: LAPAROSCOPIC NEPHRECTOMY;  Surgeon: Dutch Gray, MD;  Location: [facility];  Service: Urology;  Laterality: Right;  . MULTIPLE TOOTH EXTRACTIONS  03/04/12   with DR. Mohorn  . PEG TUBE PLACEMENT  06-25-12   remains in place abdomen-not using at present.  . PEG TUBE REMOVAL  10/2012  . tonsil biopsy  02/17/12   SCCa left tomnsil,HPV positive,spread to l cervical node  . WISDOM TOOTH EXTRACTION      Family History  Problem Relation Age of Onset  . Stroke Father   . Cancer Father        Prostate  . Colon cancer Neg Hx   . Esophageal cancer Neg Hx   . Rectal cancer Neg Hx   . Stomach cancer Neg Hx      Social History:  reports that he has never smoked. He has never used smokeless tobacco. He reports that he does not drink alcohol or use drugs.  Allergies:  Allergies  Allergen Reactions  . Banana Other (See Comments)    Unknown   . Penicillins     unknown    Allergies as of 05/06/2018      Reactions   Banana Other (See Comments)   Unknown    Penicillins    unknown      Medication List        Accurate as of 05/06/18  4:32 PM. Always use your most recent med list.          levothyroxine 75 MCG tablet Commonly known as:  SYNTHROID, LEVOTHROID Take 1 tablet (75 mcg total) by mouth daily before breakfast.   multivitamin tablet Take 1 tablet by mouth daily.   pilocarpine 5 MG tablet Commonly known as:  SALAGEN TAKE 1 TABLET BY MOUTH 3 TIMES DAILY          Review of Systems  Constitutional: Negative for reduced appetite.  HENT:       Has had dry mouth since radiation and takes pilocarpine.  May have difficulty swallowing certain foods  Respiratory: Negative for shortness of breath.   Cardiovascular: Negative for palpitations and leg swelling.  Endocrine: Positive for cold intolerance.  Musculoskeletal: Negative for joint pain.  Skin: Negative for dry skin.  Neurological: Negative for weakness.    He has had variable blood pressure readings:  BP Readings from Last 3 Encounters:  05/06/18 140/90  04/27/18 135/72  02/11/18 122/85                Examination:    BP 140/90 (BP Location: Right Arm, Patient Position: Sitting, Cuff Size: Normal)   Pulse 63   Ht 5\' 10"  (1.778 m)   Wt 178 lb 3.2 oz (80.8 kg)   SpO2 97%   BMI 25.57 kg/m   GENERAL:  Average build.   No pallor, clubbing, lymphadenopathy or edema.  Skin:  no rash or pigmentation.  EYES:  No prominence of the eyes or swelling of the eyelids  ENT: Oral mucosa and tongue normal.  THYROID:  Not palpable.  HEART:  Normal  S1 and S2; no murmur or click.  CHEST:    Lungs: Vescicular  breath sounds heard equally.  No crepitations/ wheeze.  ABDOMEN:  No distention.  Liver and spleen not palpable.  No other mass or tenderness.  NEUROLOGICAL: Reflexes are bilaterally normal at biceps and ankles.  JOINTS:  Normal peripheral joints.   Assessment:  HYPOTHYROIDISM, mild and secondary  to radiation of neck for tonsil cancer in 2014 Appears to have been relatively asymptomatic even at diagnosis and usually has no symptoms when his TSH is increased  His last dosage change was in 6/19 when he was given 100 mcg of levothyroxine with his TSH being 4.8 Since then he has not had any subjective symptoms suggestive of hypothyroidism or also of over replacement  PLAN:   Check thyroid levels today to determine his thyroid supplementation dosage Discussed etiology of his hypothyroidism, possible symptoms of hypothyroidism and target TSH levels  Follow-up to be decided   Elayne Snare 05/06/2018, 4:32 PM   Consultation note copy sent to the PCP  ADDENDUM: TSH normal, to continue the same dose and follow-up in 6 months  Lab Results  Component Value Date   TSH 1.28 05/06/2018     Note: This office note was prepared with Dragon voice recognition system technology. Any transcriptional errors that result from this process are unintentional.

## 2018-05-07 NOTE — Progress Notes (Signed)
Please call to let patient know that the lab results are normal  He will continue 100 mcg levothyroxine and follow-up in 6 months as scheduled

## 2018-05-12 ENCOUNTER — Encounter: Payer: Self-pay | Admitting: Gastroenterology

## 2018-08-23 ENCOUNTER — Ambulatory Visit (INDEPENDENT_AMBULATORY_CARE_PROVIDER_SITE_OTHER): Payer: Self-pay | Admitting: Nurse Practitioner

## 2018-08-23 VITALS — BP 120/82 | HR 83 | Temp 98.3°F | Wt 183.4 lb

## 2018-08-23 DIAGNOSIS — J209 Acute bronchitis, unspecified: Secondary | ICD-10-CM

## 2018-08-23 MED ORDER — METHYLPREDNISOLONE 4 MG PO TBPK: ORAL_TABLET | ORAL | 0 refills | Status: DC

## 2018-08-23 MED ORDER — PSEUDOEPH-BROMPHEN-DM 30-2-10 MG/5ML PO SYRP: 5.0000 mL | ORAL_SOLUTION | Freq: Four times a day (QID) | ORAL | 0 refills | Status: AC | PRN

## 2018-08-23 MED ORDER — DOXYCYCLINE HYCLATE 100 MG PO TABS: 100.0000 mg | ORAL_TABLET | Freq: Two times a day (BID) | ORAL | 0 refills | Status: AC

## 2018-08-23 NOTE — Progress Notes (Addendum)
Subjective:     Bobby Gonzalez is a 52 y.o. male here for evaluation of a cough.  The cough is without wheezing, dyspnea or hemoptysis, productive of green/yellow sputum and is aggravated by nothing. Onset of symptoms was 4-5 weeks ago, gradually worsening since that time.  Associated symptoms include fever, postnasal drip and fatigue. Patient does not have a history of asthma. Patient has not had recent travel. Patient does not have a history of smoking. Patient  has not had a previous chest x-ray. The patient does have a history of cancer, nephrectomy, currently does not take any medications and has no allergies to medications.  The following portions of the patient's history were reviewed and updated as appropriate: allergies, current medications and past medical history.  Review of Systems Constitutional: positive for fatigue and fevers, negative for anorexia, chills and sweats Eyes: negative Ears, nose, mouth, throat, and face: positive for sore throat and postnasal drip, negative for ear drainage, earaches, hoarseness and nasal congestion Respiratory: positive for cough and sputum, negative for asthma, chronic bronchitis, dyspnea on exertion, pneumonia, stridor and wheezing Cardiovascular: negative Gastrointestinal: negative Neurological: positive for headaches, negative for coordination problems, dizziness, paresthesia, tremors, vertigo and weakness     Objective:   BP 120/82 (BP Location: Right Arm, Patient Position: Sitting)   Pulse 83   Temp 98.3 F (36.8 C) (Oral)   Wt 183 lb 6.4 oz (83.2 kg)   SpO2 100%   BMI 26.32 kg/m  General appearance: alert, cooperative, fatigued and no distress Head: Normocephalic, without obvious abnormality, atraumatic Eyes: conjunctivae/corneas clear. PERRL, EOM's intact. Fundi benign. Ears: normal TM's and external ear canals both ears Nose: no discharge, no congestion, mild maxillary sinus tenderness bilateral, mild frontal sinus tenderness  bilateral Throat: lips, mucosa, and tongue normal; teeth and gums normal Lungs: clear to auscultation bilaterally Heart: regular rate and rhythm, S1, S2 normal, no murmur, click, rub or gallop Abdomen: soft, non-tender; bowel sounds normal; no masses,  no organomegaly Pulses: 2+ and symmetric Skin: Skin color, texture, turgor normal. No rashes or lesions Lymph nodes: cervical and submandibular nodes normal Neurologic: Grossly normal    Assessment:    Acute Bronchitis    Plan:   Exam findings, diagnosis etiology and medication use and indications reviewed with patient. Follow- Up and discharge instructions provided. No emergent/urgent issues found on exam.  Based on the patient's clinical presentation, physical assessment, and history and physical, this is possibly a bacterial infection.  Patient has had symptoms greater than 10 days with apparent worsening. We will go ahead and treat patient with doxycycline, steroid taper and cough medicine.  Informed patient that if symptoms do not improve with this course of treatment, he will need to follow-up with his PCP as he may need a chest x-ray at that time.  Also informed patient to follow-up for worsening fever, worsening cough, change in sputum production, or other concerns.  Patient education was provided. Patient verbalized understanding of information provided and agrees with plan of care (POC), all questions answered. The patient is advised to call or return to clinic if condition does not see an improvement in symptoms, or to seek the care of the closest emergency department if condition worsens with the above plan.   1. Acute bronchitis, unspecified organism  - doxycycline (VIBRA-TABS) 100 MG tablet; Take 1 tablet (100 mg total) by mouth 2 (two) times daily for 10 days.  Dispense: 20 tablet; Refill: 0 - methylPREDNISolone (MEDROL DOSEPAK) 4 MG TBPK tablet; Take  as directed.  Dispense: 21 tablet; Refill: 0 - brompheniramine-pseudoephedrine-DM  30-2-10 MG/5ML syrup; Take 5 mLs by mouth 4 (four) times daily as needed for up to 7 days.  Dispense: 150 mL; Refill: 0 -Take medication as prescribed. -Ibuprofen or Tylenol for pain, fever, or general discomfort. -Increase fluids. -Sleep elevated on at least 2 pillows at bedtime to help with cough. -Use a humidifier or vaporizer when at home and during sleep to help with cough. -May use a teaspoon of honey or over-the-counter cough drops to help with cough. -Follow-up with PCP if symptoms do not improve.

## 2018-08-23 NOTE — Patient Instructions (Signed)
Acute Bronchitis, Adult -Take medication as prescribed. -Ibuprofen or Tylenol for pain, fever, or general discomfort. -Increase fluids. -Sleep elevated on at least 2 pillows at bedtime to help with cough. -Use a humidifier or vaporizer when at home and during sleep to help with cough. -May use a teaspoon of honey or over-the-counter cough drops to help with cough. -Follow-up with PCP if symptoms do not improve.    Acute bronchitis is sudden (acute) swelling of the air tubes (bronchi) in the lungs. Acute bronchitis causes these tubes to fill with mucus, which can make it hard to breathe. It can also cause coughing or wheezing. In adults, acute bronchitis usually goes away within 2 weeks. A cough caused by bronchitis may last up to 3 weeks. Smoking, allergies, and asthma can make the condition worse. Repeated episodes of bronchitis may cause further lung problems, such as chronic obstructive pulmonary disease (COPD). What are the causes? This condition can be caused by germs and by substances that irritate the lungs, including:  Cold and flu viruses. This condition is most often caused by the same virus that causes a cold.  Bacteria.  Exposure to tobacco smoke, dust, fumes, and air pollution. What increases the risk? This condition is more likely to develop in people who:  Have close contact with someone with acute bronchitis.  Are exposed to lung irritants, such as tobacco smoke, dust, fumes, and vapors.  Have a weak immune system.  Have a respiratory condition such as asthma. What are the signs or symptoms? Symptoms of this condition include:  A cough.  Coughing up clear, yellow, or green mucus.  Wheezing.  Chest congestion.  Shortness of breath.  A fever.  Body aches.  Chills.  A sore throat. How is this diagnosed? This condition is usually diagnosed with a physical exam. During the exam, your health care provider may order tests, such as chest X-rays, to rule out  other conditions. He or she may also:  Test a sample of your mucus for bacterial infection.  Check the level of oxygen in your blood. This is done to check for pneumonia.  Do a chest X-ray or lung function testing to rule out pneumonia and other conditions.  Perform blood tests. Your health care provider will also ask about your symptoms and medical history. How is this treated? Most cases of acute bronchitis clear up over time without treatment. Your health care provider may recommend:  Drinking more fluids. Drinking more makes your mucus thinner, which may make it easier to breathe.  Taking a medicine for a fever or cough.  Taking an antibiotic medicine.  Using an inhaler to help improve shortness of breath and to control a cough.  Using a cool mist vaporizer or humidifier to make it easier to breathe. Follow these instructions at home: Medicines  Take over-the-counter and prescription medicines only as told by your health care provider.  If you were prescribed an antibiotic, take it as told by your health care provider. Do not stop taking the antibiotic even if you start to feel better. General instructions   Get plenty of rest.  Drink enough fluids to keep your urine pale yellow.  Avoid smoking and secondhand smoke. Exposure to cigarette smoke or irritating chemicals will make bronchitis worse. If you smoke and you need help quitting, ask your health care provider. Quitting smoking will help your lungs heal faster.  Use an inhaler, cool mist vaporizer, or humidifier as told by your health care provider.  Keep all  follow-up visits as told by your health care provider. This is important. How is this prevented? To lower your risk of getting this condition again:  Wash your hands often with soap and water. If soap and water are not available, use hand sanitizer.  Avoid contact with people who have cold symptoms.  Try not to touch your hands to your mouth, nose, or  eyes.  Make sure to get the flu shot every year. Contact a health care provider if:  Your symptoms do not improve in 2 weeks of treatment. Get help right away if:  You cough up blood.  You have chest pain.  You have severe shortness of breath.  You become dehydrated.  You faint or keep feeling like you are going to faint.  You keep vomiting.  You have a severe headache.  Your fever or chills gets worse. This information is not intended to replace advice given to you by your health care provider. Make sure you discuss any questions you have with your health care provider. Document Released: 09/26/2004 Document Revised: 04/02/2017 Document Reviewed: 02/07/2016 Elsevier Interactive Patient Education  2019 [employer].

## 2018-08-24 ENCOUNTER — Telehealth: Payer: Self-pay | Admitting: Nurse Practitioner

## 2018-08-24 NOTE — Telephone Encounter (Signed)
Received a phone call from the patient requesting a referral to his oncologist.  Informed patient that the way our office is set up we are unable to make referrals.  Instructed patient to call his primary doctor which is Shirline Frees, MD., asking him to make a referral to the oncologist for follow-up or to either get the appropriate scans.  Patient verbalized understanding.

## 2018-08-24 NOTE — Telephone Encounter (Signed)
Call the patient regarding his visit to [facility] on 08/23/18.  After more detailed review of the patient's chart, found that patient had a previous CT scan in 2016 that showed multiple lung nodules.  Based on the patient's presentation of cough for 4 to 5 weeks, called the patient to inform him that he needs to follow-up with his oncologist.  Patient states that he was not aware of this issue regarding the lung nodules.  Informed patient that oncologist documented that lung nodules work were benign at that time, but it would be in his best interest to follow-up to have repeat imaging for confirmation of no change in the previous findings..  Informed patient to call his oncologist to discuss, and also to inform the oncologist of his new symptoms.  Informed patient that he may need an x-ray or another CT scan.  Informed patient that due to lack of resources in our office, he needs to contact his physician today to get an appointment scheduled as soon as possible.  Patient verbalized understanding and thanked me for my call.

## 2018-09-03 ENCOUNTER — Other Ambulatory Visit: Payer: Self-pay | Admitting: Family Medicine

## 2018-09-03 DIAGNOSIS — R05 Cough: Secondary | ICD-10-CM

## 2018-09-03 DIAGNOSIS — R911 Solitary pulmonary nodule: Secondary | ICD-10-CM

## 2018-09-03 DIAGNOSIS — R059 Cough, unspecified: Secondary | ICD-10-CM

## 2018-09-04 ENCOUNTER — Ambulatory Visit
Admission: RE | Admit: 2018-09-04 | Discharge: 2018-09-04 | Disposition: A | Discharge status: Home / Self Care | Payer: No Typology Code available for payment source | Source: Ambulatory Visit | Attending: Family Medicine | Admitting: Family Medicine

## 2018-09-04 DIAGNOSIS — R05 Cough: Secondary | ICD-10-CM

## 2018-09-04 DIAGNOSIS — R059 Cough, unspecified: Secondary | ICD-10-CM

## 2018-09-04 DIAGNOSIS — R911 Solitary pulmonary nodule: Secondary | ICD-10-CM

## 2018-11-02 ENCOUNTER — Other Ambulatory Visit: Payer: Self-pay

## 2018-11-05 NOTE — Progress Notes (Signed)
Patient ID: Bobby Gonzalez, male   DOB: 26-Oct-1965, 53 y.o.   MRN: [id number]            Reason for Appointment:  Hypothyroidism,  visit    History of Present Illness:   Hypothyroidism was first diagnosed in 2014 after his radiation for tonsillar carcinoma  At the time of diagnosis he does not remember what symptoms he was having and not clear what his baseline TSH was He was told that because of radiation his thyroid function was decreased .          The patient has been treated with  50 mcg of levothyroxine initially and this was increased up to 75 mcg in 2015  With starting thyroid supplementation he did not think he had any change in his energy level  Currently the patient does not complain of any fatigue, dry skin, or recent weight gain He says that he tends to feel cold but this is persistent for the last few years  In 6/19 his oncologist checked his TSH and this was relatively higher at 4.8 His PCP increased his levothyroxine up to 100 mcg Again with this dosage increase he did not feel any different with his energy level  Subsequently TSH was normal but he has not had a recent lab drawn Currently feeling fairly well although has gained some weight  He takes his thyroid supplement a couple of hours before breakfast in the morning consistently  [name] is as follows:  Wt Readings from Last 3 Encounters:  11/06/18 195 lb (88.5 kg)  08/23/18 183 lb 6.4 oz (83.2 kg)  05/06/18 178 lb 3.2 oz (80.8 kg)    Thyroid function results have been as follows:  Lab Results  Component Value Date   TSH 1.28 05/06/2018   TSH 4.800 (H) 02/11/2018   TSH 1.550 02/10/2017   TSH 2.293 05/17/2015   FREET4 0.93 05/06/2018   FREET4 1.37 02/03/2015   FREET4 1.29 05/13/2014   FREET4 1.46 08/13/2013     Past Medical History:  Diagnosis Date  . Allergy    pcns  . 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 radiation and chemo 05-13-12(Dr. Lamonte Sakai)  . Chronic kidney disease    right renal mass 7.3x6.7x6.8cm renal cell ca  . Dehydration 05/13/2012  . Difficult intubation    POSSIBLE DIFFICULT INTUBATION--S/P RADIATION FOR CANCER LEFT TONSIL--EATING BUT THROAT STILL SORE AND PT HAS THICK MUCUS  . Diverticula of colon 03/10/12   multiple rectosigmoid colonic diverticula/ ct abd/pelvis  . GERD (gastroesophageal reflux disease)   . History of radiation therapy 03/30/12-05/15/12   left tonsil  . HTN (hypertension) 06-25-12   at first start of chemo for squamous cell cancer of neck-left  . Hypothyroid 02/15/2013  . Pain    JOINT PAINS UPPER BODY AFTER RADIATION TREATMENTS  . Phlegm in throat 05/13/2012  . Renal cell carcinoma ([place]) 09/09/2012  . Renal mass 03/30/2012  . Tonsil cancer ([place]) 02/2012   SCCa of Left tonsil-S/P biopsy on 02/17/12.    Past Surgical History:  Procedure Laterality Date  . DIRECT LARYNGOSCOPY     S/P DL, Biopsy-Dr. Constance Holster. Pathology postive for SCCa of Left Tonsil  . GASTROSTOMY TUBE PLACEMENT  03/02/12  . INGUINAL HERNIA REPAIR Left 11/04/2012   Procedure: HERNIA REPAIR INGUINAL ADULT;  Surgeon: Joyice Faster. Cornett, MD;  Location: [facility];  Service: General;  Laterality: Left;  . INSERTION  OF MESH Left 11/04/2012   Procedure: INSERTION OF MESH;  Surgeon: Joyice Faster. Cornett, MD;  Location: [facility];  Service: General;  Laterality: Left;  . LAPAROSCOPIC NEPHRECTOMY  06/29/2012   Procedure: LAPAROSCOPIC NEPHRECTOMY;  Surgeon: Dutch Gray, MD;  Location: [facility];  Service: Urology;  Laterality: Right;  . MULTIPLE TOOTH EXTRACTIONS  03/04/12   with DR. Mohorn  . PEG TUBE PLACEMENT  06-25-12   remains in place abdomen-not using at present.  . PEG TUBE REMOVAL  10/2012  . tonsil biopsy  02/17/12   SCCa left tomnsil,HPV positive,spread to l cervical node  . WISDOM TOOTH EXTRACTION      Family History  Problem Relation Age of Onset  . Stroke Father   . Cancer Father        Prostate  .  Colon cancer Neg Hx   . Esophageal cancer Neg Hx   . Rectal cancer Neg Hx   . Stomach cancer Neg Hx   . Thyroid disease Neg Hx     Social History:  reports that he has never smoked. He has never used smokeless tobacco. He reports that he does not drink alcohol or use drugs.  Allergies:  Allergies  Allergen Reactions  . Banana Other (See Comments)    Unknown   . Penicillins     unknown    Allergies as of 11/06/2018      Reactions   Banana Other (See Comments)   Unknown    Penicillins    unknown      Medication List       Accurate as of November 06, 2018  8:23 AM. Always use your most recent med list.        levothyroxine 75 MCG tablet Commonly known as:  SYNTHROID, LEVOTHROID Take 1 tablet (75 mcg total) by mouth daily before breakfast.   multivitamin tablet Take 1 tablet by mouth daily.   pilocarpine 5 MG tablet Commonly known as:  SALAGEN TAKE 1 TABLET BY MOUTH 3 TIMES DAILY          Review of Systems                Examination:    BP 122/78 (BP Location: Left Arm, Patient Position: Sitting, Cuff Size: Normal)   Pulse 63   Ht 5\' 10"  (1.778 m)   Wt 195 lb (88.5 kg)   SpO2 97%   BMI 27.98 kg/m    Thyroid not palpable Biceps reflexes appear normal  Assessment:  HYPOTHYROIDISM, mild and likely secondary to radiation of neck for tonsil cancer in 2014 Appears to have been relatively asymptomatic even at diagnosis and usually has no symptoms when his TSH is increased  His last dosage change was in 6/19 when he was given 100 mcg of levothyroxine Subsequently TSH was normal but needs follow-up He feels fairly good subjectively without any unusual fatigue, does have chronic cold intolerance  He is compliant with his supplement and understands the need for long-term treatment  PLAN:   Check thyroid levels today to determine his thyroid dosage Follow-up in 12 months if no change made  Elayne Snare 11/06/2018, 8:23 AM     Note: This office note was  prepared with Dragon voice recognition system technology. Any transcriptional errors that result from this process are unintentional.  Addendum: TSH unchanged at 1.25, continue same dosage

## 2018-11-06 ENCOUNTER — Encounter: Payer: Self-pay | Admitting: Endocrinology

## 2018-11-06 ENCOUNTER — Other Ambulatory Visit: Payer: Self-pay

## 2018-11-06 ENCOUNTER — Ambulatory Visit: Payer: No Typology Code available for payment source | Admitting: Endocrinology

## 2018-11-06 DIAGNOSIS — E038 Other specified hypothyroidism: Secondary | ICD-10-CM

## 2018-11-06 LAB — TSH: TSH: 1.25 u[IU]/mL (ref 0.35–4.50)

## 2018-11-06 LAB — T4, FREE: Free T4: 1.1 ng/dL (ref 0.60–1.60)

## 2019-01-20 ENCOUNTER — Telehealth: Payer: Self-pay | Admitting: Radiation Oncology

## 2019-01-20 NOTE — Telephone Encounter (Signed)
I called the patient to follow up with him. He called in wanting to change his appointment. I let him know that guidelines now support letting patients follow up PRN with ENT or [place] if they became symptomatic now that he's greater than 5 years from his treatment. He indicates that he is not having any progressive issues with swallowing since we last spoke. In fact, he's been able to get Xylimelts from [facility] and finds that these help quite a bit. He continues to be active and follows with endocrinology for management of his secondary hypothyroidism and is on the same dose as he had been of levothyroxine. He plans to follow up instead as needed with myself or ENT, and is comfortable with this plan moving forward. I will cancel his June appointment.     Carola Rhine, PAC

## 2019-02-17 ENCOUNTER — Ambulatory Visit: Payer: Self-pay | Admitting: Radiation Oncology

## 2019-03-24 ENCOUNTER — Other Ambulatory Visit: Payer: Self-pay | Admitting: Nephrology

## 2019-03-24 DIAGNOSIS — N183 Chronic kidney disease, stage 3 unspecified: Secondary | ICD-10-CM

## 2019-03-30 ENCOUNTER — Ambulatory Visit
Admission: RE | Admit: 2019-03-30 | Discharge: 2019-03-30 | Disposition: A | Discharge status: Home / Self Care | Payer: No Typology Code available for payment source | Source: Ambulatory Visit | Attending: Nephrology | Admitting: Nephrology

## 2019-03-30 DIAGNOSIS — N183 Chronic kidney disease, stage 3 unspecified: Secondary | ICD-10-CM

## 2019-05-11 ENCOUNTER — Ambulatory Visit (INDEPENDENT_AMBULATORY_CARE_PROVIDER_SITE_OTHER): Payer: No Typology Code available for payment source | Admitting: Family Medicine

## 2019-05-11 ENCOUNTER — Other Ambulatory Visit: Payer: Self-pay

## 2019-05-11 DIAGNOSIS — N183 Chronic kidney disease, stage 3 unspecified: Secondary | ICD-10-CM

## 2019-05-11 DIAGNOSIS — Z713 Dietary counseling and surveillance: Secondary | ICD-10-CM

## 2019-05-11 NOTE — Patient Instructions (Addendum)
General Recommendations for Preserving Renal Function  Maintain appropriate weight.   Monitor weight for signs of fluid retention. Other signs of fluid retention are shortness of breath, swelling in the feet, hands, and face, and high blood pressure.  Eat more whole, real foods, and limit highly processed foods, which are often sources of sodium and phosphorus.   WATER is the body's preferred fluid.    Specific recommendations - Protein recommendation is 60-65 grams per day, distributed throughout each meal.    Each ounce of meat, fish, poultry, or cheese and 1 large egg = 7 grams of protein.    - Use a cheese slicer instead of a knife to cut cheese thinly.     1 cup of most cooked beans = 12 g protein.   Check the protein content of your yogurt.  The regular (not [place]) has less protein.    2 tbsp peanut butter = 8 grams of protein.   - Limit sodium: Check labels for sodium levels.  For example, look for a lower-sodium bread.  A low-sodium option for a starchy food would be rice, potato, and even most pastas.  Aim for no more than 1500-1800 mg per day.   - Drink more water and less other fluids.  I will check with April about the proportions of ingredients in your blended water.  My concern is its potassium content.    - Eat more whole, real foods, and limit highly processed foods.  The latter includes crackers and most baked goods, but even more obvious are what we usually identify as packaged snack foods.  Better snacks include fruit and yogurt, nuts and seeds.  Generally speaking, foods that come in "nature's package" are healthier.

## 2019-05-11 NOTE — Progress Notes (Signed)
Medical Nutrition Therapy PCP Azalia Bilis, MD; [facility] (Fax encounter notes to [phone])  Learning Readiness: Change in progress  Assessment:  Primary concerns today: renal diet.  Bobby Gonzalez was referred by Dr. Kenton Kingfisher for Medical Nutrition Therapy related to CKD stage III (N18.3) w/ h/o renal cell carcinoma.  His renal physician advised him to limit to caffeine to 8 oz beverage/day, to avoid processed meat, and to limit protein, but he isn't sure what that limited protein should be.    Working in [employer] Bobby Gonzalez is spending all day in extreme heat for much of the summer.  His usual fluid intake is a mixture of blended pineapple chunks with coconut water, coconut milk, and plain water, of which he consumes ~200-250 oz (24-32 cups) per day.  He is unsure of proportions, but K+ content of this beverage is likely very high.  1 cup of coconut milk, coconut water, and pineapple chunks provides 600, 500, and 300 mg K+ respectively.   Protein intake is probably higher than appropriate for stage 3 CKD.  Although protein intake was minimal yesterday, typical intake includes [place] yogurt and eggs for bkfst and meat portions for lunch/dinner of 4-8 oz, likely providing a usual intake of nearly 100 g/day.    Usual eating pattern: 3 meals and 1-2 snacks per day. Frequent foods and beverages: water blended with coconut water, coconut milk, and pineapple juice, plain water; egg whites, thin toast with butter and jam, fish, chx, blue- or strawberries, veg's.   Avoided foods: banana (allergy), milk (lact-intol), most red meats, processed meat.   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  During off-season, still doing mulching and collecting leaves.   Sleep: Estimates he gets at least 8 hrs/night.    24-hr recall: (Up at 7 AM)  7:30 AM: mixed water with levothyroxine B (10 AM)-  1 BLT sandwich, mayo, 1 waffle, butter, syrup, 12 oz swt tea Snk ( AM)-   --- L ( PM)-  [drank ~30  oz of mixed water through day] Snk ( PM)-  --- D (6 PM)-  8 oz bbq, 1/2 c slaw, 3 hush puppies, 15-20 fries, ketchup, water Snk ( PM)-  --- Typical day? No.  Typical work day includes bkfst of 2 eggs, 2 thin toast, butter & jam, 3 tbsp yogurt, 6-7 str'berries; lunch of grilled chx or ~4 oz shrimp/crab salad alone or with ~10 crackers; dinner of 3 oz meat with starch and veg's.  Typical snacks are flavored rice cakes, fruit, cracker/chips occasionally, usually a sweet food ~1 X day.      Nutritional Diagnosis:  NB-1.1 Food and nutrition-related knowledge deficit As related to renal diet.  As evidenced by expressed concerns and interest to learn how to preserve renal function.  Handouts given during visit include:  After-Visit Summary (AVS)  Handout on dietary potassium and potassium content of foods   Demonstrated degree of understanding via:  Teach Back  Barriers to learning/adherence to lifestyle change: Long-standing eating behaviors that include relatively high sodium and phosphorus levels.   Monitoring/Evaluation:  Dietary intake, exercise, and body weight prn.

## 2019-08-23 ENCOUNTER — Ambulatory Visit: Payer: No Typology Code available for payment source | Attending: Internal Medicine

## 2019-08-23 DIAGNOSIS — Z20822 Contact with and (suspected) exposure to covid-19: Secondary | ICD-10-CM

## 2019-08-24 LAB — NOVEL CORONAVIRUS, NAA: SARS-CoV-2, NAA: DETECTED — AB

## 2019-08-25 ENCOUNTER — Telehealth: Payer: Self-pay | Admitting: Unknown Physician Specialty

## 2019-08-25 NOTE — Telephone Encounter (Signed)
Called to discuss with patient about Covid symptoms and the use of bamlanivimab, a monoclonal antibody infusion for those with mild to moderate Covid symptoms and at a high risk of hospitalization.  Pt is qualified for this infusion at the [facility] infusion center due to immuno-compromised with CKD and renal CA   Message left to call back

## 2019-08-25 NOTE — Telephone Encounter (Signed)
Called to discuss with patient about Covid symptoms and the use of bamlanivimab, a monoclonal antibody infusion for those with mild to moderate Covid symptoms and at a high risk of hospitalization.  Pt is not qualified for this infusion as sxs greater than 10 days

## 2019-10-08 ENCOUNTER — Other Ambulatory Visit: Payer: Self-pay

## 2019-10-11 ENCOUNTER — Encounter: Payer: Self-pay | Admitting: Family Medicine

## 2019-10-11 ENCOUNTER — Other Ambulatory Visit: Payer: Self-pay

## 2019-10-11 ENCOUNTER — Ambulatory Visit (INDEPENDENT_AMBULATORY_CARE_PROVIDER_SITE_OTHER): Payer: No Typology Code available for payment source | Admitting: Family Medicine

## 2019-10-11 VITALS — BP 144/100 | HR 58 | Temp 97.3°F | Resp 18 | Ht 70.0 in | Wt 193.4 lb

## 2019-10-11 DIAGNOSIS — Z0000 Encounter for general adult medical examination without abnormal findings: Secondary | ICD-10-CM

## 2019-10-11 DIAGNOSIS — N189 Chronic kidney disease, unspecified: Secondary | ICD-10-CM | POA: Diagnosis not present

## 2019-10-11 DIAGNOSIS — I10 Essential (primary) hypertension: Secondary | ICD-10-CM

## 2019-10-11 DIAGNOSIS — E039 Hypothyroidism, unspecified: Secondary | ICD-10-CM | POA: Diagnosis not present

## 2019-10-11 DIAGNOSIS — R682 Dry mouth, unspecified: Secondary | ICD-10-CM

## 2019-10-11 DIAGNOSIS — R5383 Other fatigue: Secondary | ICD-10-CM | POA: Diagnosis not present

## 2019-10-11 HISTORY — DX: Essential (primary) hypertension: I10

## 2019-10-11 LAB — CBC WITH DIFFERENTIAL/PLATELET
Basophils Absolute: 0 10*3/uL (ref 0.0–0.1)
Basophils Relative: 0.2 % (ref 0.0–3.0)
Eosinophils Absolute: 0.2 10*3/uL (ref 0.0–0.7)
Eosinophils Relative: 3.9 % (ref 0.0–5.0)
HCT: 48.1 % (ref 39.0–52.0)
Hemoglobin: 16.4 g/dL (ref 13.0–17.0)
Lymphocytes Relative: 19.4 % (ref 12.0–46.0)
Lymphs Abs: 0.9 10*3/uL (ref 0.7–4.0)
MCHC: 34 g/dL (ref 30.0–36.0)
MCV: 90.9 fl (ref 78.0–100.0)
Monocytes Absolute: 0.6 10*3/uL (ref 0.1–1.0)
Monocytes Relative: 11.9 % (ref 3.0–12.0)
Neutro Abs: 3.1 10*3/uL (ref 1.4–7.7)
Neutrophils Relative %: 64.6 % (ref 43.0–77.0)
Platelets: 236 10*3/uL (ref 150.0–400.0)
RBC: 5.29 Mil/uL (ref 4.22–5.81)
RDW: 13.8 % (ref 11.5–15.5)
WBC: 4.8 10*3/uL (ref 4.0–10.5)

## 2019-10-11 LAB — COMPREHENSIVE METABOLIC PANEL
ALT: 26 U/L (ref 0–53)
AST: 20 U/L (ref 0–37)
Albumin: 4.5 g/dL (ref 3.5–5.2)
Alkaline Phosphatase: 65 U/L (ref 39–117)
BUN: 15 mg/dL (ref 6–23)
CO2: 30 mEq/L (ref 19–32)
Calcium: 9.6 mg/dL (ref 8.4–10.5)
Chloride: 104 mEq/L (ref 96–112)
Creatinine, Ser: 1.21 mg/dL (ref 0.40–1.50)
GFR: 62.72 mL/min (ref >60.00)
Glucose, Bld: 97 mg/dL (ref 70–99)
Potassium: 4.6 mEq/L (ref 3.5–5.1)
Sodium: 139 mEq/L (ref 135–145)
Total Bilirubin: 0.6 mg/dL (ref 0.2–1.2)
Total Protein: 6.9 g/dL (ref 6.0–8.3)

## 2019-10-11 LAB — LIPID PANEL
Cholesterol: 140 mg/dL (ref 0–200)
HDL: 39.3 mg/dL (ref >39.00)
LDL Cholesterol: 77 mg/dL (ref 0–99)
NonHDL: 100.72
Total CHOL/HDL Ratio: 4
Triglycerides: 119 mg/dL (ref 0.0–149.0)
VLDL: 23.8 mg/dL (ref 0.0–40.0)

## 2019-10-11 LAB — PSA: PSA: 0.27 ng/mL (ref 0.10–4.00)

## 2019-10-11 LAB — VITAMIN B12: Vitamin B-12: 358 pg/mL (ref 211–911)

## 2019-10-11 MED ORDER — LORATADINE 10 MG PO TABS: 10.0000 mg | ORAL_TABLET | Freq: Every day | ORAL | 11 refills | Status: DC

## 2019-10-11 MED ORDER — LOSARTAN POTASSIUM 25 MG PO TABS: 25.0000 mg | ORAL_TABLET | Freq: Every day | ORAL | 0 refills | Status: DC

## 2019-10-11 NOTE — Patient Instructions (Signed)
DASH Eating Plan DASH stands for "Dietary Approaches to Stop Hypertension." The DASH eating plan is a healthy eating plan that has been shown to reduce high blood pressure (hypertension). It may also reduce your risk for type 2 diabetes, heart disease, and stroke. The DASH eating plan may also help with weight loss. What are tips for following this plan?  General guidelines  Avoid eating more than 2,300 mg (milligrams) of salt (sodium) a day. If you have hypertension, you may need to reduce your sodium intake to 1,500 mg a day.  Limit alcohol intake to no more than 1 drink a day for nonpregnant women and 2 drinks a day for men. One drink equals 12 oz of beer, 5 oz of wine, or 1 oz of hard liquor.  Work with your health care provider to maintain a healthy body weight or to lose weight. Ask what an ideal weight is for you.  Get at least 30 minutes of exercise that causes your heart to beat faster (aerobic exercise) most days of the week. Activities may include walking, swimming, or biking.  Work with your health care provider or diet and nutrition specialist (dietitian) to adjust your eating plan to your individual calorie needs. Reading food labels   Check food labels for the amount of sodium per serving. Choose foods with less than 5 percent of the Daily Value of sodium. Generally, foods with less than 300 mg of sodium per serving fit into this eating plan.  To find whole grains, look for the word "whole" as the first word in the ingredient list. Shopping  Buy products labeled as "low-sodium" or "no salt added."  Buy fresh foods. Avoid canned foods and premade or frozen meals. Cooking  Avoid adding salt when cooking. Use salt-free seasonings or herbs instead of table salt or sea salt. Check with your health care provider or pharmacist before using salt substitutes.  Do not fry foods. Cook foods using healthy methods such as baking, boiling, grilling, and broiling instead.  Cook with  heart-healthy oils, such as olive, canola, soybean, or sunflower oil. Meal planning  Eat a balanced diet that includes: ? 5 or more servings of fruits and vegetables each day. At each meal, try to fill half of your plate with fruits and vegetables. ? Up to 6-8 servings of whole grains each day. ? Less than 6 oz of lean meat, poultry, or fish each day. A 3-oz serving of meat is about the same size as a deck of cards. One egg equals 1 oz. ? 2 servings of low-fat dairy each day. ? A serving of nuts, seeds, or beans 5 times each week. ? Heart-healthy fats. Healthy fats called Omega-3 fatty acids are found in foods such as flaxseeds and coldwater fish, like sardines, salmon, and mackerel.  Limit how much you eat of the following: ? Canned or prepackaged foods. ? Food that is high in trans fat, such as fried foods. ? Food that is high in saturated fat, such as fatty meat. ? Sweets, desserts, sugary drinks, and other foods with added sugar. ? Full-fat dairy products.  Do not salt foods before eating.  Try to eat at least 2 vegetarian meals each week.  Eat more home-cooked food and less restaurant, buffet, and fast food.  When eating at a restaurant, ask that your food be prepared with less salt or no salt, if possible. What foods are recommended? The items listed may not be a complete list. Talk with your dietitian about   what dietary choices are best for you. Grains Whole-grain or whole-wheat bread. Whole-grain or whole-wheat pasta. Brown rice. Oatmeal. Quinoa. Bulgur. Whole-grain and low-sodium cereals. Pita bread. Low-fat, low-sodium crackers. Whole-wheat flour tortillas. Vegetables Fresh or frozen vegetables (raw, steamed, roasted, or grilled). Low-sodium or reduced-sodium tomato and vegetable juice. Low-sodium or reduced-sodium tomato sauce and tomato paste. Low-sodium or reduced-sodium canned vegetables. Fruits All fresh, dried, or frozen fruit. Canned fruit in natural juice (without  added sugar). Meat and other protein foods Skinless chicken or turkey. Ground chicken or turkey. Pork with fat trimmed off. Fish and seafood. Egg whites. Dried beans, peas, or lentils. Unsalted nuts, nut butters, and seeds. Unsalted canned beans. Lean cuts of beef with fat trimmed off. Low-sodium, lean deli meat. Dairy Low-fat (1%) or fat-free (skim) milk. Fat-free, low-fat, or reduced-fat cheeses. Nonfat, low-sodium ricotta or cottage cheese. Low-fat or nonfat yogurt. Low-fat, low-sodium cheese. Fats and oils Soft margarine without trans fats. Vegetable oil. Low-fat, reduced-fat, or light mayonnaise and salad dressings (reduced-sodium). Canola, safflower, olive, soybean, and sunflower oils. Avocado. Seasoning and other foods Herbs. Spices. Seasoning mixes without salt. Unsalted popcorn and pretzels. Fat-free sweets. What foods are not recommended? The items listed may not be a complete list. Talk with your dietitian about what dietary choices are best for you. Grains Baked goods made with fat, such as croissants, muffins, or some breads. Dry pasta or rice meal packs. Vegetables Creamed or fried vegetables. Vegetables in a cheese sauce. Regular canned vegetables (not low-sodium or reduced-sodium). Regular canned tomato sauce and paste (not low-sodium or reduced-sodium). Regular tomato and vegetable juice (not low-sodium or reduced-sodium). Pickles. Olives. Fruits Canned fruit in a light or heavy syrup. Fried fruit. Fruit in cream or butter sauce. Meat and other protein foods Fatty cuts of meat. Ribs. Fried meat. Bacon. Sausage. Bologna and other processed lunch meats. Salami. Fatback. Hotdogs. Bratwurst. Salted nuts and seeds. Canned beans with added salt. Canned or smoked fish. Whole eggs or egg yolks. Chicken or turkey with skin. Dairy Whole or 2% milk, cream, and half-and-half. Whole or full-fat cream cheese. Whole-fat or sweetened yogurt. Full-fat cheese. Nondairy creamers. Whipped toppings.  Processed cheese and cheese spreads. Fats and oils Butter. Stick margarine. Lard. Shortening. Ghee. Bacon fat. Tropical oils, such as coconut, palm kernel, or palm oil. Seasoning and other foods Salted popcorn and pretzels. Onion salt, garlic salt, seasoned salt, table salt, and sea salt. Worcestershire sauce. Tartar sauce. Barbecue sauce. Teriyaki sauce. Soy sauce, including reduced-sodium. Steak sauce. Canned and packaged gravies. Fish sauce. Oyster sauce. Cocktail sauce. Horseradish that you find on the shelf. Ketchup. Mustard. Meat flavorings and tenderizers. Bouillon cubes. Hot sauce and Tabasco sauce. Premade or packaged marinades. Premade or packaged taco seasonings. Relishes. Regular salad dressings. Where to find more information:  National Heart, Lung, and Blood Institute: www.nhlbi.nih.gov  American Heart Association: www.heart.org Summary  The DASH eating plan is a healthy eating plan that has been shown to reduce high blood pressure (hypertension). It may also reduce your risk for type 2 diabetes, heart disease, and stroke.  With the DASH eating plan, you should limit salt (sodium) intake to 2,300 mg a day. If you have hypertension, you may need to reduce your sodium intake to 1,500 mg a day.  When on the DASH eating plan, aim to eat more fresh fruits and vegetables, whole grains, lean proteins, low-fat dairy, and heart-healthy fats.  Work with your health care provider or diet and nutrition specialist (dietitian) to adjust your eating plan to your   individual calorie needs. This information is not intended to replace advice given to you by your health care provider. Make sure you discuss any questions you have with your health care provider. Document Revised: 08/01/2017 Document Reviewed: 08/12/2016 Elsevier Patient Education  2020 Elsevier Inc.  

## 2019-10-11 NOTE — Assessment & Plan Note (Signed)
con't pilocarpine

## 2019-10-11 NOTE — Progress Notes (Signed)
Patient ID: Bobby Gonzalez, male    DOB: [birth year]  Age: 54 y.o. MRN: [id number]    Subjective:  Subjective  HPI Bobby Gonzalez presents to establish.  Pt has hx of renal ca, and throat and tongue ca.   He sees endocrine and needs labs checked due to brand change on his synthroid. No other complaints   Review of Systems  Constitutional: Negative for fatigue, fever and unexpected weight change.  HENT: Negative for congestion.   Respiratory: Negative for cough and shortness of breath.   Cardiovascular: Negative for chest pain, palpitations and leg swelling.  Gastrointestinal: Negative for abdominal pain, blood in stool, nausea and vomiting.  Genitourinary: Negative for dysuria and frequency.  Musculoskeletal: Negative for back pain.  Skin: Negative for rash.  Allergic/Immunologic: Negative for environmental allergies.  Neurological: Negative for dizziness and headaches.  Psychiatric/Behavioral: The patient is not nervous/anxious.     History Past Medical History:  Diagnosis Date  . Allergy    pcns  . 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 radiation and chemo 05-13-12(Dr. Lamonte Sakai)  . Chronic kidney disease    right renal mass 7.3x6.7x6.8cm renal cell ca  . Dehydration 05/13/2012  . Difficult intubation    POSSIBLE DIFFICULT INTUBATION--S/P RADIATION FOR CANCER LEFT TONSIL--EATING BUT THROAT STILL SORE AND PT HAS THICK MUCUS  . Diverticula of colon 03/10/12   multiple rectosigmoid colonic diverticula/ ct abd/pelvis  . GERD (gastroesophageal reflux disease)   . History of radiation therapy 03/30/12-05/15/12   left tonsil  . HTN (hypertension) 06-25-12   at first start of chemo for squamous cell cancer of neck-left  . Hypothyroid 02/15/2013  . Pain    JOINT PAINS UPPER BODY AFTER RADIATION TREATMENTS  . Phlegm in throat 05/13/2012  . Renal cell carcinoma ([place]) 09/09/2012  . Renal mass 03/30/2012  . Tonsil cancer ([place])  02/2012   SCCa of Left tonsil-S/P biopsy on 02/17/12.    He has a past surgical history that includes Wisdom tooth extraction; Direct laryngoscopy; Gastrostomy tube placement (03/02/12); Multiple tooth extractions (03/04/12); PEG tube placement (06-25-12); tonsil biopsy (02/17/12); Laparoscopic nephrectomy (06/29/2012); PEG tube removal (10/2012); Inguinal hernia repair (Left, 11/04/2012); and Insertion of mesh (Left, 11/04/2012).   His family history includes Cancer in his father; Hypertension in his brother and father; Stroke in his father; Throat cancer in his brother; Thyroid disease in his brother.He reports that he has never smoked. He has never used smokeless tobacco. He reports that he does not drink alcohol or use drugs.  Current Outpatient Medications on File Prior to Visit  Medication Sig Dispense Refill  . levothyroxine (SYNTHROID, LEVOTHROID) 100 MCG tablet Take 100 mcg by mouth daily before breakfast.    . Multiple Vitamin (MULTIVITAMIN) tablet Take 1 tablet by mouth daily.    . pilocarpine (SALAGEN) 5 MG tablet TAKE 1 TABLET BY MOUTH 3 TIMES DAILY 90 tablet 0   No current facility-administered medications on file prior to visit.     Objective:  Objective  Physical Exam BP (!) 144/100 (BP Location: Right Arm, Patient Position: Sitting, Cuff Size: Normal)   Pulse (!) 58   Temp (!) 97.3 F (36.3 C) (Temporal)   Resp 18   Ht 5\' 10"  (1.778 m)   Wt 193 lb 6.4 oz (87.7 kg)   SpO2 100%   BMI 27.75 kg/m  Wt Readings from Last 3 Encounters:  10/11/19 193 lb 6.4 oz (87.7 kg)  11/06/18 195 lb (88.5 kg)  08/23/18 183 lb 6.4 oz (83.2 kg)     Lab Results  Component Value Date   WBC 4.8 10/11/2019   HGB 16.4 10/11/2019   HCT 48.1 10/11/2019   PLT 236.0 10/11/2019   GLUCOSE 97 10/11/2019   CHOL 140 10/11/2019   TRIG 119.0 10/11/2019   HDL 39.30 10/11/2019   LDLCALC 77 10/11/2019   ALT 26 10/11/2019   AST 20 10/11/2019   NA 139 10/11/2019   K 4.6 10/11/2019   CL 104 10/11/2019    CREATININE 1.21 10/11/2019   BUN 15 10/11/2019   CO2 30 10/11/2019   TSH 1.25 11/06/2018   PSA 0.27 10/11/2019   INR 0.94 03/02/2012    US RENAL  Result Date: 03/31/2019 CLINICAL DATA:  Stage III chronic renal disease EXAM: RENAL / URINARY TRACT ULTRASOUND COMPLETE COMPARISON:  CT abdomen and pelvis May 17, 2015 FINDINGS: Right Kidney: Surgically absent.  No lesions seen in the right pararenal fossa. Left Kidney: Renal measurements: 13.5 x 6.5 x 5.9 cm = volume: 272 mL. Echogenicity and renal cortical thickness are within normal limits. No mass, perinephric fluid, or hydronephrosis visualized. No sonographically demonstrable calculus or ureterectasis. Bladder: Appears normal for degree of bladder distention. IMPRESSION: Status post right nephrectomy.  Normal appearing left kidney. Electronically Signed   By: Lowella Grip III M.D.   On: 03/31/2019 13:00     Assessment & Plan:  Plan  I am having Bobby Gonzalez start on loratadine and losartan. I am also having him maintain his pilocarpine, multivitamin, and levothyroxine.  Meds ordered this encounter  Medications  . loratadine (CLARITIN) 10 MG tablet    Sig: Take 1 tablet (10 mg total) by mouth daily.    Dispense:  30 tablet    Refill:  11  . losartan (COZAAR) 25 MG tablet    Sig: Take 1 tablet (25 mg total) by mouth daily.    Dispense:  30 tablet    Refill:  0    Problem List Items Addressed This Visit      Unprioritized   Chronic kidney disease    Per nephrology Check labs today      Relevant Orders   Comprehensive metabolic panel (Completed)   Dry mouth    con't pilocarpine      Essential hypertension    Poorly controlled will alter medications, encouraged DASH diet, minimize caffeine and obtain adequate sleep. Report concerning symptoms and follow up as directed and as needed Start med--- f/u 2-3 weeks or sooner prn       Relevant Medications   losartan (COZAAR) 25 MG tablet   Hypothyroid - Primary     Check labs today con't synthroid Pt sees endo       Relevant Orders   CBC with Differential/Platelet (Completed)   Thyroid Panel With TSH    Other Visit Diagnoses    Other fatigue       Relevant Orders   CBC with Differential/Platelet (Completed)   Lipid panel (Completed)   Vitamin B12 (Completed)   VITAMIN D 25 Hydroxy (Vit-D Deficiency, Fractures)   Preventative health care       Relevant Orders   PSA (Completed)      Follow-up: Return in about 2 weeks (around 10/25/2019), or if symptoms worsen or fail to improve, for hypertension,  needs a cpe  also.  Ann Held, DO

## 2019-10-11 NOTE — Assessment & Plan Note (Signed)
Poorly controlled will alter medications, encouraged DASH diet, minimize caffeine and obtain adequate sleep. Report concerning symptoms and follow up as directed and as needed Start med--- f/u 2-3 weeks or sooner prn

## 2019-10-11 NOTE — Assessment & Plan Note (Signed)
Check labs today con't synthroid Pt sees endo

## 2019-10-11 NOTE — Assessment & Plan Note (Signed)
Per nephrology Check labs today 

## 2019-10-12 LAB — THYROID PANEL WITH TSH
Free Thyroxine Index: 3.2 (ref 1.4–3.8)
T3 Uptake: 34 % (ref 22–35)
T4, Total: 9.5 ug/dL (ref 4.9–10.5)
TSH: 1.13 mIU/L (ref 0.40–4.50)

## 2019-10-22 ENCOUNTER — Other Ambulatory Visit: Payer: Self-pay

## 2019-10-25 ENCOUNTER — Ambulatory Visit (INDEPENDENT_AMBULATORY_CARE_PROVIDER_SITE_OTHER): Payer: No Typology Code available for payment source | Admitting: Family Medicine

## 2019-10-25 ENCOUNTER — Encounter: Payer: Self-pay | Admitting: Family Medicine

## 2019-10-25 ENCOUNTER — Other Ambulatory Visit: Payer: Self-pay

## 2019-10-25 DIAGNOSIS — C099 Malignant neoplasm of tonsil, unspecified: Secondary | ICD-10-CM

## 2019-10-25 DIAGNOSIS — I10 Essential (primary) hypertension: Secondary | ICD-10-CM | POA: Diagnosis not present

## 2019-10-25 DIAGNOSIS — K117 Disturbances of salivary secretion: Secondary | ICD-10-CM | POA: Diagnosis not present

## 2019-10-25 MED ORDER — LOSARTAN POTASSIUM 25 MG PO TABS: 25.0000 mg | ORAL_TABLET | Freq: Every day | ORAL | 1 refills | Status: DC

## 2019-10-25 MED ORDER — PILOCARPINE HCL 5 MG PO TABS: 5.0000 mg | ORAL_TABLET | Freq: Three times a day (TID) | ORAL | 3 refills | Status: DC

## 2019-10-25 NOTE — Assessment & Plan Note (Addendum)
Well controlled, no changes to meds. Encouraged heart healthy diet such as the DASH diet and exercise as tolerated.  Nephrology really want s pt to lose the weight and come off meds if possible

## 2019-10-25 NOTE — Patient Instructions (Signed)

## 2019-10-25 NOTE — Progress Notes (Signed)
Patient ID: Bobby Gonzalez, male    DOB: 06-Nov-1965  Age: 54 y.o. MRN: [id number]    Subjective:  Subjective  HPI [name] presents for bp check.  No complaints   Review of Systems  Constitutional: Negative for appetite change, diaphoresis, fatigue and unexpected weight change.  Eyes: Negative for pain, redness and visual disturbance.  Respiratory: Negative for cough, chest tightness, shortness of breath and wheezing.   Cardiovascular: Negative for chest pain, palpitations and leg swelling.  Endocrine: Negative for cold intolerance, heat intolerance, polydipsia, polyphagia and polyuria.  Genitourinary: Negative for difficulty urinating, dysuria and frequency.  Neurological: Negative for dizziness, light-headedness, numbness and headaches.    History Past Medical History:  Diagnosis Date  . Allergy    pcns  . 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 radiation and chemo 05-13-12(Dr. Lamonte Sakai)  . Chronic kidney disease    right renal mass 7.3x6.7x6.8cm renal cell ca  . Dehydration 05/13/2012  . Difficult intubation    POSSIBLE DIFFICULT INTUBATION--S/P RADIATION FOR CANCER LEFT TONSIL--EATING BUT THROAT STILL SORE AND PT HAS THICK MUCUS  . Diverticula of colon 03/10/12   multiple rectosigmoid colonic diverticula/ ct abd/pelvis  . GERD (gastroesophageal reflux disease)   . History of radiation therapy 03/30/12-05/15/12   left tonsil  . HTN (hypertension) 06-25-12   at first start of chemo for squamous cell cancer of neck-left  . Hypothyroid 02/15/2013  . Pain    JOINT PAINS UPPER BODY AFTER RADIATION TREATMENTS  . Phlegm in throat 05/13/2012  . Renal cell carcinoma ([place]) 09/09/2012  . Renal mass 03/30/2012  . Tonsil cancer ([place]) 02/2012   SCCa of Left tonsil-S/P biopsy on 02/17/12.    He has a past surgical history that includes Wisdom tooth extraction; Direct laryngoscopy; Gastrostomy tube placement (03/02/12); Multiple  tooth extractions (03/04/12); PEG tube placement (06-25-12); tonsil biopsy (02/17/12); Laparoscopic nephrectomy (06/29/2012); PEG tube removal (10/2012); Inguinal hernia repair (Left, 11/04/2012); and Insertion of mesh (Left, 11/04/2012).   His family history includes Cancer in his father; Hypertension in his brother and father; Stroke in his father; Throat cancer in his brother; Thyroid disease in his brother.He reports that he has never smoked. He has never used smokeless tobacco. He reports that he does not drink alcohol or use drugs.  Current Outpatient Medications on File Prior to Visit  Medication Sig Dispense Refill  . levothyroxine (SYNTHROID, LEVOTHROID) 100 MCG tablet Take 100 mcg by mouth daily before breakfast.    . loratadine (CLARITIN) 10 MG tablet Take 1 tablet (10 mg total) by mouth daily. 30 tablet 11  . Multiple Vitamin (MULTIVITAMIN) tablet Take 1 tablet by mouth daily.     No current facility-administered medications on file prior to visit.     Objective:  Objective  Physical Exam Vitals and nursing note reviewed.  Constitutional:      General: He is sleeping.     Appearance: He is well-developed.  HENT:     Head: Normocephalic and atraumatic.  Eyes:     Pupils: Pupils are equal, round, and reactive to light.  Neck:     Thyroid: No thyromegaly.  Cardiovascular:     Rate and Rhythm: Normal rate and regular rhythm.     Heart sounds: No murmur.  Pulmonary:     Effort: Pulmonary effort is normal. No respiratory distress.     Breath sounds: Normal breath sounds. No wheezing or rales.  Chest:  Chest wall: No tenderness.  Musculoskeletal:        General: No tenderness.     Cervical back: Normal range of motion and neck supple.  Skin:    General: Skin is warm and dry.  Neurological:     Mental Status: He is oriented to person, place, and time.  Psychiatric:        Behavior: Behavior normal.        Thought Content: Thought content normal.        Judgment: Judgment  normal.    BP 114/78 (BP Location: Left Arm, Patient Position: Sitting, Cuff Size: Normal)   Pulse 76   Temp (!) 97.5 F (36.4 C) (Temporal)   Resp 18   Ht 5\' 10"  (1.778 m)   Wt 194 lb 9.6 oz (88.3 kg)   SpO2 98%   BMI 27.92 kg/m  Wt Readings from Last 3 Encounters:  10/25/19 194 lb 9.6 oz (88.3 kg)  10/11/19 193 lb 6.4 oz (87.7 kg)  11/06/18 195 lb (88.5 kg)     Lab Results  Component Value Date   WBC 4.8 10/11/2019   HGB 16.4 10/11/2019   HCT 48.1 10/11/2019   PLT 236.0 10/11/2019   GLUCOSE 97 10/11/2019   CHOL 140 10/11/2019   TRIG 119.0 10/11/2019   HDL 39.30 10/11/2019   LDLCALC 77 10/11/2019   ALT 26 10/11/2019   AST 20 10/11/2019   NA 139 10/11/2019   K 4.6 10/11/2019   CL 104 10/11/2019   CREATININE 1.21 10/11/2019   BUN 15 10/11/2019   CO2 30 10/11/2019   TSH 1.13 10/11/2019   PSA 0.27 10/11/2019   INR 0.94 03/02/2012    US RENAL  Result Date: 03/31/2019 CLINICAL DATA:  Stage III chronic renal disease EXAM: RENAL / URINARY TRACT ULTRASOUND COMPLETE COMPARISON:  CT abdomen and pelvis May 17, 2015 FINDINGS: Right Kidney: Surgically absent.  No lesions seen in the right pararenal fossa. Left Kidney: Renal measurements: 13.5 x 6.5 x 5.9 cm = volume: 272 mL. Echogenicity and renal cortical thickness are within normal limits. No mass, perinephric fluid, or hydronephrosis visualized. No sonographically demonstrable calculus or ureterectasis. Bladder: Appears normal for degree of bladder distention. IMPRESSION: Status post right nephrectomy.  Normal appearing left kidney. Electronically Signed   By: Lowella Grip III M.D.   On: 03/31/2019 13:00     Assessment & Plan:  Plan  I have changed Ashby Dawes. Flood's pilocarpine. I am also having him maintain his multivitamin, levothyroxine, loratadine, and losartan.  Meds ordered this encounter  Medications  . pilocarpine (SALAGEN) 5 MG tablet    Sig: Take 1 tablet (5 mg total) by mouth 3 (three) times daily.     Dispense:  90 tablet    Refill:  3  . losartan (COZAAR) 25 MG tablet    Sig: Take 1 tablet (25 mg total) by mouth daily.    Dispense:  90 tablet    Refill:  1    Problem List Items Addressed This Visit      Unprioritized   Essential hypertension    Well controlled, no changes to meds. Encouraged heart healthy diet such as the DASH diet and exercise as tolerated.  Nephrology really want s pt to lose the weight and come off meds if possible       Relevant Medications   losartan (COZAAR) 25 MG tablet   Tonsil cancer ([facility])   Relevant Medications   pilocarpine (SALAGEN) 5 MG tablet  Other Visit Diagnoses    Xerostomia       Relevant Medications   pilocarpine (SALAGEN) 5 MG tablet      Follow-up: Return in about 3 months (around 01/22/2020), or if symptoms worsen or fail to improve.  Ann Held, DO

## 2019-11-01 ENCOUNTER — Other Ambulatory Visit: Payer: No Typology Code available for payment source

## 2019-11-02 ENCOUNTER — Other Ambulatory Visit: Payer: No Typology Code available for payment source

## 2019-11-03 ENCOUNTER — Other Ambulatory Visit: Payer: Self-pay

## 2019-11-03 ENCOUNTER — Other Ambulatory Visit (INDEPENDENT_AMBULATORY_CARE_PROVIDER_SITE_OTHER): Payer: No Typology Code available for payment source

## 2019-11-03 DIAGNOSIS — E038 Other specified hypothyroidism: Secondary | ICD-10-CM

## 2019-11-03 LAB — T4, FREE: Free T4: 1.18 ng/dL (ref 0.60–1.60)

## 2019-11-03 LAB — TSH: TSH: 0.62 u[IU]/mL (ref 0.35–4.50)

## 2019-11-05 ENCOUNTER — Other Ambulatory Visit: Payer: Self-pay

## 2019-11-05 ENCOUNTER — Encounter: Payer: Self-pay | Admitting: Endocrinology

## 2019-11-05 ENCOUNTER — Ambulatory Visit: Payer: No Typology Code available for payment source | Admitting: Endocrinology

## 2019-11-05 VITALS — BP 122/70 | HR 64 | Ht 70.0 in | Wt 191.6 lb

## 2019-11-05 DIAGNOSIS — E038 Other specified hypothyroidism: Secondary | ICD-10-CM

## 2019-11-05 NOTE — Progress Notes (Signed)
Patient ID: Bobby Gonzalez, male   DOB: 04-Jul-1966, 54 y.o.   MRN: [id number]            Reason for Appointment:  Hypothyroidism, follow-up visit    History of Present Illness:   Hypothyroidism was first diagnosed in 2014 after his radiation for tonsillar carcinoma  At the time of diagnosis he does not remember what symptoms he was having and not clear what his baseline TSH was He was told that because of radiation his thyroid function was decreased .          The patient has been treated with  50 mcg of levothyroxine initially and this was increased up to 75 mcg in 2015  With starting thyroid supplementation he did not think he had any change in his energy level  Currently the patient does not complain of any fatigue, dry skin, or recent weight gain He says that he tends to feel cold but this is persistent for the last few years  In 6/19 his oncologist checked his TSH and this was relatively higher at 4.8 His PCP increased his levothyroxine up to 100 mcg After this dosage increase he did not feel any different with his energy level  Subsequently TSH has been consistently normal He had been feeling tired but this week has been feeling much better He thinks this is from winter blues No cold intolerance, heat intolerance or shakiness, no skin changes  He takes his thyroid supplement on waking up before breakfast in the morning daily and has not missed any doses Does not take any other medications or supplements until at least 3 hours later He thinks his pharmacy has changed his manufacturer recently  TSH is quite normal although now 0.62 compared to 1.1 last month done by PCP  [name] is as follows:  Wt Readings from Last 3 Encounters:  11/05/19 191 lb 9.6 oz (86.9 kg)  10/25/19 194 lb 9.6 oz (88.3 kg)  10/11/19 193 lb 6.4 oz (87.7 kg)    Thyroid function results have been as follows:  Lab Results  Component Value Date   TSH 0.62 11/03/2019   TSH 1.13  10/11/2019   TSH 1.25 11/06/2018   TSH 1.28 05/06/2018   FREET4 1.18 11/03/2019   FREET4 1.10 11/06/2018   FREET4 0.93 05/06/2018   FREET4 1.37 02/03/2015     Past Medical History:  Diagnosis Date  . Allergy    pcns  . 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 radiation and chemo 05-13-12(Dr. Lamonte Sakai)  . Chronic kidney disease    right renal mass 7.3x6.7x6.8cm renal cell ca  . Dehydration 05/13/2012  . Difficult intubation    POSSIBLE DIFFICULT INTUBATION--S/P RADIATION FOR CANCER LEFT TONSIL--EATING BUT THROAT STILL SORE AND PT HAS THICK MUCUS  . Diverticula of colon 03/10/12   multiple rectosigmoid colonic diverticula/ ct abd/pelvis  . GERD (gastroesophageal reflux disease)   . History of radiation therapy 03/30/12-05/15/12   left tonsil  . HTN (hypertension) 06-25-12   at first start of chemo for squamous cell cancer of neck-left  . Hypothyroid 02/15/2013  . Pain    JOINT PAINS UPPER BODY AFTER RADIATION TREATMENTS  . Phlegm in throat 05/13/2012  . Renal cell carcinoma ([place]) 09/09/2012  . Renal mass 03/30/2012  . Tonsil cancer ([place]) 02/2012   SCCa of Left tonsil-S/P biopsy on 02/17/12.    Past Surgical History:  Procedure Laterality Date  .  DIRECT LARYNGOSCOPY     S/P DL, Biopsy-Dr. Constance Holster. Pathology postive for SCCa of Left Tonsil  . GASTROSTOMY TUBE PLACEMENT  03/02/12  . INGUINAL HERNIA REPAIR Left 11/04/2012   Procedure: HERNIA REPAIR INGUINAL ADULT;  Surgeon: Joyice Faster. Cornett, MD;  Location: [facility];  Service: General;  Laterality: Left;  . INSERTION OF MESH Left 11/04/2012   Procedure: INSERTION OF MESH;  Surgeon: Joyice Faster. Cornett, MD;  Location: [facility];  Service: General;  Laterality: Left;  . LAPAROSCOPIC NEPHRECTOMY  06/29/2012   Procedure: LAPAROSCOPIC NEPHRECTOMY;  Surgeon: Dutch Gray, MD;  Location: [facility];  Service: Urology;  Laterality: Right;  . MULTIPLE TOOTH EXTRACTIONS  03/04/12   with DR. Mohorn  . PEG  TUBE PLACEMENT  06-25-12   remains in place abdomen-not using at present.  . PEG TUBE REMOVAL  10/2012  . tonsil biopsy  02/17/12   SCCa left tomnsil,HPV positive,spread to l cervical node  . WISDOM TOOTH EXTRACTION      Family History  Problem Relation Age of Onset  . Stroke Father   . Cancer Father        Prostate  . Hypertension Father   . Thyroid disease Brother   . Hypertension Brother   . Throat cancer Brother   . Colon cancer Neg Hx   . Esophageal cancer Neg Hx   . Rectal cancer Neg Hx   . Stomach cancer Neg Hx     Social History:  reports that he has never smoked. He has never used smokeless tobacco. He reports that he does not drink alcohol or use drugs.  Allergies:  Allergies  Allergen Reactions  . Banana Other (See Comments)    Unknown   . Penicillins     unknown    Allergies as of 11/05/2019      Reactions   Banana Other (See Comments)   Unknown    Penicillins    unknown      Medication List       Accurate as of November 05, 2019  8:08 AM. If you have any questions, ask your nurse or doctor.        levothyroxine 100 MCG tablet Commonly known as: SYNTHROID Take 100 mcg by mouth daily before breakfast.   loratadine 10 MG tablet Commonly known as: CLARITIN Take 1 tablet (10 mg total) by mouth daily.   losartan 25 MG tablet Commonly known as: COZAAR Take 1 tablet (25 mg total) by mouth daily.   multivitamin tablet Take 1 tablet by mouth daily.   pilocarpine 5 MG tablet Commonly known as: SALAGEN Take 1 tablet (5 mg total) by mouth 3 (three) times daily.          Review of Systems    He has a solitary kidney and has been followed by nephrologist             Examination:    BP 122/70 (BP Location: Left Arm, Patient Position: Sitting, Cuff Size: Normal)   Pulse 64   Ht 5\' 10"  (1.778 m)   Wt 191 lb 9.6 oz (86.9 kg)   SpO2 98%   BMI 27.49 kg/m    Thyroid not palpable Biceps reflexes show normal relaxation No peripheral  edema  Assessment:  HYPOTHYROIDISM, mild and likely secondary to radiation of neck for tonsil cancer in 2014 He had been asymptomatic at diagnosis and usually has no symptoms when his TSH is mildly increased  His last dosage change was in 6/19 when he was increased  to 100 mcg of levothyroxine Subsequently TSH was normal He feels fairly good recently although has had other reasons to be tired this winter  No significant weight change  He is very consistent with taking his levothyroxine on waking up and will take his vitamins much later  PLAN:   We will continue 100 mcg levothyroxine He can be followed by his PCP now unless she prefers that he come back here   Elayne Snare 11/05/2019, 8:08 AM     Note: This office note was prepared with Dragon voice recognition system technology. Any transcriptional errors that result from this process are unintentional.

## 2019-11-09 ENCOUNTER — Other Ambulatory Visit: Payer: Self-pay

## 2019-11-09 ENCOUNTER — Encounter: Payer: Self-pay | Admitting: Family Medicine

## 2019-11-09 ENCOUNTER — Ambulatory Visit (INDEPENDENT_AMBULATORY_CARE_PROVIDER_SITE_OTHER): Payer: No Typology Code available for payment source | Admitting: Family Medicine

## 2019-11-09 VITALS — BP 108/68 | HR 74 | Temp 97.3°F | Resp 18 | Ht 70.0 in | Wt 190.2 lb

## 2019-11-09 DIAGNOSIS — Z0000 Encounter for general adult medical examination without abnormal findings: Secondary | ICD-10-CM | POA: Diagnosis not present

## 2019-11-09 DIAGNOSIS — Z299 Encounter for prophylactic measures, unspecified: Secondary | ICD-10-CM

## 2019-11-09 DIAGNOSIS — N183 Chronic kidney disease, stage 3 unspecified: Secondary | ICD-10-CM

## 2019-11-09 DIAGNOSIS — Z23 Encounter for immunization: Secondary | ICD-10-CM

## 2019-11-09 DIAGNOSIS — E039 Hypothyroidism, unspecified: Secondary | ICD-10-CM | POA: Diagnosis not present

## 2019-11-09 DIAGNOSIS — I10 Essential (primary) hypertension: Secondary | ICD-10-CM

## 2019-11-09 MED ORDER — LEVOTHYROXINE SODIUM 100 MCG PO TABS: 100.0000 ug | ORAL_TABLET | Freq: Every day | ORAL | 1 refills | Status: DC

## 2019-11-09 NOTE — Assessment & Plan Note (Signed)
Per nephrology 

## 2019-11-09 NOTE — Assessment & Plan Note (Signed)
Lab Results  Component Value Date   TSH 0.62 11/03/2019

## 2019-11-09 NOTE — Assessment & Plan Note (Signed)
ghm utd Check labs  See AVs  

## 2019-11-09 NOTE — Assessment & Plan Note (Signed)
Well controlled, no changes to meds. Encouraged heart healthy diet such as the DASH diet and exercise as tolerated.  °

## 2019-11-09 NOTE — Patient Instructions (Signed)
Preventive Care 41-53 Years Old, Male Preventive care refers to lifestyle choices and visits with your health care provider that can promote health and wellness. This includes:  A yearly physical exam. This is also called an annual well check.  Regular dental and eye exams.  Immunizations.  Screening for certain conditions.  Healthy lifestyle choices, such as eating a healthy diet, getting regular exercise, not using drugs or products that contain nicotine and tobacco, and limiting alcohol use. What can I expect for my preventive care visit? Physical exam Your health care provider will check:  Height and weight. These may be used to calculate body mass index (BMI), which is a measurement that tells if you are at a healthy weight.  Heart rate and blood pressure.  Your skin for abnormal spots. Counseling Your health care provider may ask you questions about:  Alcohol, tobacco, and drug use.  Emotional well-being.  Home and relationship well-being.  Sexual activity.  Eating habits.  Work and work Statistician. What immunizations do I need?  Influenza (flu) vaccine  This is recommended every year. Tetanus, diphtheria, and pertussis (Tdap) vaccine  You may need a Td booster every 10 years. Varicella (chickenpox) vaccine  You may need this vaccine if you have not already been vaccinated. Zoster (shingles) vaccine  You may need this after age 64. Measles, mumps, and rubella (MMR) vaccine  You may need at least one dose of MMR if you were born in 1957 or later. You may also need a second dose. Pneumococcal conjugate (PCV13) vaccine  You may need this if you have certain conditions and were not previously vaccinated. Pneumococcal polysaccharide (PPSV23) vaccine  You may need one or two doses if you smoke cigarettes or if you have certain conditions. Meningococcal conjugate (MenACWY) vaccine  You may need this if you have certain conditions. Hepatitis A  vaccine  You may need this if you have certain conditions or if you travel or work in places where you may be exposed to hepatitis A. Hepatitis B vaccine  You may need this if you have certain conditions or if you travel or work in places where you may be exposed to hepatitis B. Haemophilus influenzae type b (Hib) vaccine  You may need this if you have certain risk factors. Human papillomavirus (HPV) vaccine  If recommended by your health care provider, you may need three doses over 6 months. You may receive vaccines as individual doses or as more than one vaccine together in one shot (combination vaccines). Talk with your health care provider about the risks and benefits of combination vaccines. What tests do I need? Blood tests  Lipid and cholesterol levels. These may be checked every 5 years, or more frequently if you are over 60 years old.  Hepatitis C test.  Hepatitis B test. Screening  Lung cancer screening. You may have this screening every year starting at age 43 if you have a 30-pack-year history of smoking and currently smoke or have quit within the past 15 years.  Prostate cancer screening. Recommendations will vary depending on your family history and other risks.  Colorectal cancer screening. All adults should have this screening starting at age 72 and continuing until age 2. Your health care provider may recommend screening at age 14 if you are at increased risk. You will have tests every 1-10 years, depending on your results and the type of screening test.  Diabetes screening. This is done by checking your blood sugar (glucose) after you have not eaten  for a while (fasting). You may have this done every 1-3 years.  Sexually transmitted disease (STD) testing. Follow these instructions at home: Eating and drinking  Eat a diet that includes fresh fruits and vegetables, whole grains, lean protein, and low-fat dairy products.  Take vitamin and mineral supplements as  recommended by your health care provider.  Do not drink alcohol if your health care provider tells you not to drink.  If you drink alcohol: ? Limit how much you have to 0-2 drinks a day. ? Be aware of how much alcohol is in your drink. In the [place] one drink equals one 12 oz bottle of beer (355 mL), one 5 oz glass of wine (148 mL), or one 1 oz glass of hard liquor (44 mL). Lifestyle  Take daily care of your teeth and gums.  Stay active. Exercise for at least 30 minutes on 5 or more days each week.  Do not use any products that contain nicotine or tobacco, such as cigarettes, e-cigarettes, and chewing tobacco. If you need help quitting, ask your health care provider.  If you are sexually active, practice safe sex. Use a condom or other form of protection to prevent STIs (sexually transmitted infections).  Talk with your health care provider about taking a low-dose aspirin every day starting at age 54. What's next?  Go to your health care provider once a year for a well check visit.  Ask your health care provider how often you should have your eyes and teeth checked.  Stay up to date on all vaccines. This information is not intended to replace advice given to you by your health care provider. Make sure you discuss any questions you have with your health care provider. Document Revised: 08/13/2018 Document Reviewed: 08/13/2018 Elsevier Patient Education  2020 [employer].

## 2019-11-09 NOTE — Progress Notes (Signed)
Patient ID: Bobby Gonzalez, male    DOB: 11/11/1965  Age: 54 y.o. MRN: [id number]    Subjective:  Subjective  HPI Bobby Gonzalez presents for cpe. No new complaints  Labs were done previously   Review of Systems  Constitutional: Negative.   HENT: Negative for congestion, ear pain, hearing loss, nosebleeds, postnasal drip, rhinorrhea, sinus pressure, sneezing and tinnitus.   Eyes: Negative for photophobia, discharge, itching and visual disturbance.  Respiratory: Negative.   Cardiovascular: Negative.   Gastrointestinal: Negative for abdominal distention, abdominal pain, anal bleeding, blood in stool and constipation.  Endocrine: Negative.   Genitourinary: Negative.   Musculoskeletal: Negative.   Skin: Negative.   Allergic/Immunologic: Negative.   Neurological: Negative for dizziness, weakness, light-headedness, numbness and headaches.  Psychiatric/Behavioral: Negative for agitation, confusion, decreased concentration, dysphoric mood, sleep disturbance and suicidal ideas. The patient is not nervous/anxious.     History Past Medical History:  Diagnosis Date  . Allergy    pcns  . 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 radiation and chemo 05-13-12(Dr. Lamonte Sakai)  . Chronic kidney disease    right renal mass 7.3x6.7x6.8cm renal cell ca  . Dehydration 05/13/2012  . Difficult intubation    POSSIBLE DIFFICULT INTUBATION--S/P RADIATION FOR CANCER LEFT TONSIL--EATING BUT THROAT STILL SORE AND PT HAS THICK MUCUS  . Diverticula of colon 03/10/12   multiple rectosigmoid colonic diverticula/ ct abd/pelvis  . GERD (gastroesophageal reflux disease)   . History of radiation therapy 03/30/12-05/15/12   left tonsil  . HTN (hypertension) 06-25-12   at first start of chemo for squamous cell cancer of neck-left  . Hypothyroid 02/15/2013  . Pain    JOINT PAINS UPPER BODY AFTER RADIATION TREATMENTS  . Phlegm in throat 05/13/2012  .  Renal cell carcinoma ([place]) 09/09/2012  . Renal mass 03/30/2012  . Tonsil cancer ([place]) 02/2012   SCCa of Left tonsil-S/P biopsy on 02/17/12.    He has a past surgical history that includes Wisdom tooth extraction; Direct laryngoscopy; Gastrostomy tube placement (03/02/12); Multiple tooth extractions (03/04/12); PEG tube placement (06-25-12); tonsil biopsy (02/17/12); Laparoscopic nephrectomy (06/29/2012); PEG tube removal (10/2012); Inguinal hernia repair (Left, 11/04/2012); and Insertion of mesh (Left, 11/04/2012).   His family history includes Cancer in his father; Hypertension in his brother and father; Stroke in his father; Throat cancer in his brother; Thyroid disease in his brother.He reports that he has never smoked. He has never used smokeless tobacco. He reports that he does not drink alcohol or use drugs.  Current Outpatient Medications on File Prior to Visit  Medication Sig Dispense Refill  . loratadine (CLARITIN) 10 MG tablet Take 1 tablet (10 mg total) by mouth daily. 30 tablet 11  . losartan (COZAAR) 25 MG tablet Take 1 tablet (25 mg total) by mouth daily. 90 tablet 1  . Multiple Vitamin (MULTIVITAMIN) tablet Take 1 tablet by mouth daily.    . pilocarpine (SALAGEN) 5 MG tablet Take 1 tablet (5 mg total) by mouth 3 (three) times daily. 90 tablet 3   No current facility-administered medications on file prior to visit.     Objective:  Objective  Physical Exam Vitals and nursing note reviewed.  Constitutional:      General: He is not in acute distress.    Appearance: He is well-developed. He is not diaphoretic.  HENT:     Head: Normocephalic and atraumatic.     Right Ear: External ear  normal.     Left Ear: External ear normal.     Nose: Nose normal.     Mouth/Throat:     Pharynx: No oropharyngeal exudate.  Eyes:     General:        Right eye: No discharge.        Left eye: No discharge.     Conjunctiva/sclera: Conjunctivae normal.     Pupils: Pupils are equal, round, and reactive to  light.  Neck:     Thyroid: No thyromegaly.     Vascular: No JVD.  Cardiovascular:     Rate and Rhythm: Normal rate and regular rhythm.     Heart sounds: No murmur. No friction rub. No gallop.   Pulmonary:     Effort: Pulmonary effort is normal. No respiratory distress.     Breath sounds: Normal breath sounds. No wheezing or rales.  Chest:     Chest wall: No tenderness.  Abdominal:     General: Bowel sounds are normal. There is no distension.     Palpations: Abdomen is soft. There is no mass.     Tenderness: There is no abdominal tenderness. There is no guarding or rebound.  Genitourinary:    Penis: Normal.      Prostate: Normal.     Rectum: Normal. Guaiac result negative.  Musculoskeletal:        General: No tenderness. Normal range of motion.     Cervical back: Normal range of motion and neck supple.  Lymphadenopathy:     Cervical: No cervical adenopathy.  Skin:    General: Skin is warm and dry.     Coloration: Skin is not pale.     Findings: No erythema or rash.  Neurological:     Mental Status: He is alert and oriented to person, place, and time.     Motor: No abnormal muscle tone.     Deep Tendon Reflexes: Reflexes are normal and symmetric. Reflexes normal.  Psychiatric:        Behavior: Behavior normal.        Thought Content: Thought content normal.        Judgment: Judgment normal.    BP 108/68 (BP Location: Right Arm, Patient Position: Sitting, Cuff Size: Normal)   Pulse 74   Temp (!) 97.3 F (36.3 C) (Temporal)   Resp 18   Ht 5\' 10"  (1.778 m)   Wt 190 lb 3.2 oz (86.3 kg)   SpO2 99%   BMI 27.29 kg/m  Wt Readings from Last 3 Encounters:  11/09/19 190 lb 3.2 oz (86.3 kg)  11/05/19 191 lb 9.6 oz (86.9 kg)  10/25/19 194 lb 9.6 oz (88.3 kg)     Lab Results  Component Value Date   WBC 4.8 10/11/2019   HGB 16.4 10/11/2019   HCT 48.1 10/11/2019   PLT 236.0 10/11/2019   GLUCOSE 97 10/11/2019   CHOL 140 10/11/2019   TRIG 119.0 10/11/2019   HDL 39.30  10/11/2019   LDLCALC 77 10/11/2019   ALT 26 10/11/2019   AST 20 10/11/2019   NA 139 10/11/2019   K 4.6 10/11/2019   CL 104 10/11/2019   CREATININE 1.21 10/11/2019   BUN 15 10/11/2019   CO2 30 10/11/2019   TSH 0.62 11/03/2019   PSA 0.27 10/11/2019   INR 0.94 03/02/2012    US RENAL  Result Date: 03/31/2019 CLINICAL DATA:  Stage III chronic renal disease EXAM: RENAL / URINARY TRACT ULTRASOUND COMPLETE COMPARISON:  CT abdomen and pelvis May 17, 2015 FINDINGS: Right Kidney: Surgically absent.  No lesions seen in the right pararenal fossa. Left Kidney: Renal measurements: 13.5 x 6.5 x 5.9 cm = volume: 272 mL. Echogenicity and renal cortical thickness are within normal limits. No mass, perinephric fluid, or hydronephrosis visualized. No sonographically demonstrable calculus or ureterectasis. Bladder: Appears normal for degree of bladder distention. IMPRESSION: Status post right nephrectomy.  Normal appearing left kidney. Electronically Signed   By: Lowella Grip III M.D.   On: 03/31/2019 13:00     Assessment & Plan:  Plan  I have changed Bobby Dawes. Zarrella's levothyroxine. I am also having him maintain his multivitamin, loratadine, pilocarpine, and losartan.  Meds ordered this encounter  Medications  . levothyroxine (SYNTHROID) 100 MCG tablet    Sig: Take 1 tablet (100 mcg total) by mouth daily before breakfast.    Dispense:  90 tablet    Refill:  1    Problem List Items Addressed This Visit      Unprioritized   Chronic kidney disease    Per nephrology      Essential hypertension    Well controlled, no changes to meds. Encouraged heart healthy diet such as the DASH diet and exercise as tolerated.       Hypothyroid    Lab Results  Component Value Date   TSH 0.62 11/03/2019         Relevant Medications   levothyroxine (SYNTHROID) 100 MCG tablet   Preventive measure    ghm utd Check labs  See AVs       Other Visit Diagnoses    Preventative health care    -   Primary   Need for Tdap vaccination       Relevant Orders   Tdap vaccine greater than or equal to 7yo IM (Completed)      Follow-up: Return if symptoms worsen or fail to improve, for hypertension.  Ann Held, DO

## 2019-11-26 ENCOUNTER — Ambulatory Visit: Payer: No Typology Code available for payment source | Attending: Internal Medicine

## 2019-11-26 DIAGNOSIS — Z23 Encounter for immunization: Secondary | ICD-10-CM

## 2019-11-26 NOTE — Progress Notes (Signed)
   Covid-19 Vaccination Clinic  Name:  Bobby Gonzalez    MRN: [id number] DOB: April 05, 1966  11/26/2019  [name] was observed post Covid-19 immunization for 15 minutes without incident. He was provided with Vaccine Information Sheet and instruction to access the V-Safe system.   [name] was instructed to call 911 with any severe reactions post vaccine: Marland Kitchen Difficulty breathing  . Swelling of face and throat  . A fast heartbeat  . A bad rash all over body  . Dizziness and weakness   Immunizations Administered    Name Date Dose VIS Date Route   Pfizer COVID-19 Vaccine 11/26/2019  4:59 PM 0.3 mL 08/13/2019 Intramuscular   Manufacturer: [place]   Lot: [id number]   [place]: [id number]

## 2019-12-17 ENCOUNTER — Ambulatory Visit: Payer: No Typology Code available for payment source | Attending: Internal Medicine

## 2019-12-17 DIAGNOSIS — Z23 Encounter for immunization: Secondary | ICD-10-CM

## 2019-12-17 NOTE — Progress Notes (Signed)
   Covid-19 Vaccination Clinic  Name:  Bobby Gonzalez    MRN: [id number] DOB: 07-Apr-1966  12/17/2019  [name] was observed post Covid-19 immunization for 15 minutes without incident. He was provided with Vaccine Information Sheet and instruction to access the V-Safe system.   [name] was instructed to call 911 with any severe reactions post vaccine: Marland Kitchen Difficulty breathing  . Swelling of face and throat  . A fast heartbeat  . A bad rash all over body  . Dizziness and weakness   Immunizations Administered    Name Date Dose VIS Date Route   Pfizer COVID-19 Vaccine 12/17/2019 12:06 PM 0.3 mL 08/13/2019 Intramuscular   Manufacturer: [place]   Lot: [id number]   [place]: [id number]

## 2020-05-05 ENCOUNTER — Other Ambulatory Visit: Payer: Self-pay | Admitting: Family Medicine

## 2020-05-05 DIAGNOSIS — E039 Hypothyroidism, unspecified: Secondary | ICD-10-CM

## 2020-05-15 ENCOUNTER — Encounter: Payer: Self-pay | Admitting: Family Medicine

## 2020-05-15 ENCOUNTER — Other Ambulatory Visit: Payer: Self-pay

## 2020-05-15 ENCOUNTER — Ambulatory Visit (INDEPENDENT_AMBULATORY_CARE_PROVIDER_SITE_OTHER): Payer: No Typology Code available for payment source | Admitting: Family Medicine

## 2020-05-15 VITALS — BP 98/60 | HR 53 | Temp 97.8°F | Resp 18 | Ht 70.0 in | Wt 166.2 lb

## 2020-05-15 DIAGNOSIS — E039 Hypothyroidism, unspecified: Secondary | ICD-10-CM | POA: Diagnosis not present

## 2020-05-15 DIAGNOSIS — E785 Hyperlipidemia, unspecified: Secondary | ICD-10-CM | POA: Diagnosis not present

## 2020-05-15 DIAGNOSIS — D229 Melanocytic nevi, unspecified: Secondary | ICD-10-CM | POA: Diagnosis not present

## 2020-05-15 DIAGNOSIS — I10 Essential (primary) hypertension: Secondary | ICD-10-CM

## 2020-05-15 DIAGNOSIS — Z23 Encounter for immunization: Secondary | ICD-10-CM | POA: Diagnosis not present

## 2020-05-15 NOTE — Patient Instructions (Signed)

## 2020-05-15 NOTE — Progress Notes (Signed)
Patient ID: Bobby Gonzalez, male    DOB: [birth year]  Age: 54 y.o. MRN: [id number]    Subjective:  Subjective  HPI Bobby Gonzalez presents for f/u bp and thyroid.  He also c/o lesion on his ear   No other new complaints   Review of Systems  Constitutional: Negative for appetite change, diaphoresis, fatigue and unexpected weight change.  Eyes: Negative for pain, redness and visual disturbance.  Respiratory: Negative for cough, chest tightness, shortness of breath and wheezing.   Cardiovascular: Negative for chest pain, palpitations and leg swelling.  Endocrine: Negative for cold intolerance, heat intolerance, polydipsia, polyphagia and polyuria.  Genitourinary: Negative for difficulty urinating, dysuria and frequency.  Neurological: Negative for dizziness, light-headedness, numbness and headaches.    History Past Medical History:  Diagnosis Date  . Allergy    pcns  . 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 radiation and chemo 05-13-12(Dr. Lamonte Sakai)  . Chronic kidney disease    right renal mass 7.3x6.7x6.8cm renal cell ca  . Dehydration 05/13/2012  . Difficult intubation    POSSIBLE DIFFICULT INTUBATION--S/P RADIATION FOR CANCER LEFT TONSIL--EATING BUT THROAT STILL SORE AND PT HAS THICK MUCUS  . Diverticula of colon 03/10/12   multiple rectosigmoid colonic diverticula/ ct abd/pelvis  . GERD (gastroesophageal reflux disease)   . History of radiation therapy 03/30/12-05/15/12   left tonsil  . HTN (hypertension) 06-25-12   at first start of chemo for squamous cell cancer of neck-left  . Hypothyroid 02/15/2013  . Pain    JOINT PAINS UPPER BODY AFTER RADIATION TREATMENTS  . Phlegm in throat 05/13/2012  . Renal cell carcinoma ([place]) 09/09/2012  . Renal mass 03/30/2012  . Tonsil cancer ([place]) 02/2012   SCCa of Left tonsil-S/P biopsy on 02/17/12.    He has a past surgical history that includes Wisdom tooth extraction; Direct  laryngoscopy; Gastrostomy tube placement (03/02/12); Multiple tooth extractions (03/04/12); PEG tube placement (06-25-12); tonsil biopsy (02/17/12); Laparoscopic nephrectomy (06/29/2012); PEG tube removal (10/2012); Inguinal hernia repair (Left, 11/04/2012); and Insertion of mesh (Left, 11/04/2012).   His family history includes Cancer in his father; Hypertension in his brother and father; Stroke in his father; Throat cancer in his brother; Thyroid disease in his brother.He reports that he has never smoked. He has never used smokeless tobacco. He reports that he does not drink alcohol and does not use drugs.  Current Outpatient Medications on File Prior to Visit  Medication Sig Dispense Refill  . levothyroxine (SYNTHROID) 100 MCG tablet TAKE 1 TABLET BY MOUTH ONCE DAILY BEFORE BREAKFAST 90 tablet 1  . loratadine (CLARITIN) 10 MG tablet Take 1 tablet (10 mg total) by mouth daily. 30 tablet 11  . Multiple Vitamin (MULTIVITAMIN) tablet Take 1 tablet by mouth daily.    . pilocarpine (SALAGEN) 5 MG tablet Take 1 tablet (5 mg total) by mouth 3 (three) times daily. 90 tablet 3  . losartan (COZAAR) 25 MG tablet Take 1 tablet (25 mg total) by mouth daily. (Patient not taking: Reported on 05/15/2020) 90 tablet 1   No current facility-administered medications on file prior to visit.     Objective:  Objective  Physical Exam Vitals and nursing note reviewed.  Constitutional:      General: He is sleeping.     Appearance: He is well-developed.  HENT:     Head: Normocephalic and atraumatic.   Eyes:     Pupils: Pupils are equal, round, and reactive  to light.  Neck:     Thyroid: No thyromegaly.  Cardiovascular:     Rate and Rhythm: Normal rate and regular rhythm.     Heart sounds: No murmur heard.   Pulmonary:     Effort: Pulmonary effort is normal. No respiratory distress.     Breath sounds: Normal breath sounds. No wheezing or rales.  Chest:     Chest wall: No tenderness.  Musculoskeletal:         General: No tenderness.     Cervical back: Normal range of motion and neck supple.  Skin:    General: Skin is warm and dry.  Neurological:     Mental Status: He is oriented to person, place, and time.  Psychiatric:        Behavior: Behavior normal.        Thought Content: Thought content normal.        Judgment: Judgment normal.    BP 98/60 (BP Location: Right Arm, Patient Position: Sitting, Cuff Size: Normal)   Pulse (!) 53   Temp 97.8 F (36.6 C) (Oral)   Resp 18   Ht 5\' 10"  (1.778 m)   Wt 166 lb 3.2 oz (75.4 kg)   SpO2 97%   BMI 23.85 kg/m  Wt Readings from Last 3 Encounters:  05/15/20 166 lb 3.2 oz (75.4 kg)  11/09/19 190 lb 3.2 oz (86.3 kg)  11/05/19 191 lb 9.6 oz (86.9 kg)     Lab Results  Component Value Date   WBC 4.8 10/11/2019   HGB 16.4 10/11/2019   HCT 48.1 10/11/2019   PLT 236.0 10/11/2019   GLUCOSE 97 10/11/2019   CHOL 140 10/11/2019   TRIG 119.0 10/11/2019   HDL 39.30 10/11/2019   LDLCALC 77 10/11/2019   ALT 26 10/11/2019   AST 20 10/11/2019   NA 139 10/11/2019   K 4.6 10/11/2019   CL 104 10/11/2019   CREATININE 1.21 10/11/2019   BUN 15 10/11/2019   CO2 30 10/11/2019   TSH 0.62 11/03/2019   PSA 0.27 10/11/2019   INR 0.94 03/02/2012    US RENAL  Result Date: 03/31/2019 CLINICAL DATA:  Stage III chronic renal disease EXAM: RENAL / URINARY TRACT ULTRASOUND COMPLETE COMPARISON:  CT abdomen and pelvis May 17, 2015 FINDINGS: Right Kidney: Surgically absent.  No lesions seen in the right pararenal fossa. Left Kidney: Renal measurements: 13.5 x 6.5 x 5.9 cm = volume: 272 mL. Echogenicity and renal cortical thickness are within normal limits. No mass, perinephric fluid, or hydronephrosis visualized. No sonographically demonstrable calculus or ureterectasis. Bladder: Appears normal for degree of bladder distention. IMPRESSION: Status post right nephrectomy.  Normal appearing left kidney. Electronically Signed   By: Lowella Grip III M.D.   On:  03/31/2019 13:00     Assessment & Plan:  Plan  I am having Bobby Gonzalez. Bobby Gonzalez maintain his multivitamin, loratadine, pilocarpine, losartan, and levothyroxine.  No orders of the defined types were placed in this encounter.   Problem List Items Addressed This Visit      Unprioritized   Essential hypertension    Running low=--- off med       Hypothyroid    Check labs  con't meds      Relevant Orders   TSH    Other Visit Diagnoses    Dyslipidemia    -  Primary   Relevant Orders   Lipid panel   Comprehensive metabolic panel   Need for influenza vaccination  Relevant Orders   Flu Vaccine QUAD 36+ mos IM (Completed)   Suspicious nevus       Relevant Orders   Ambulatory referral to Dermatology      Follow-up: Return in about 6 months (around 11/12/2020) for annual exam, fasting.  Ann Held, DO

## 2020-05-15 NOTE — Assessment & Plan Note (Signed)
Running low=--- off med

## 2020-05-15 NOTE — Assessment & Plan Note (Signed)
Check labs con't meds 

## 2020-05-16 ENCOUNTER — Other Ambulatory Visit: Payer: Self-pay | Admitting: Family Medicine

## 2020-05-16 DIAGNOSIS — E039 Hypothyroidism, unspecified: Secondary | ICD-10-CM

## 2020-05-16 LAB — COMPREHENSIVE METABOLIC PANEL
AG Ratio: 2 (calc) (ref 1.0–2.5)
ALT: 15 U/L (ref 9–46)
AST: 17 U/L (ref 10–35)
Albumin: 4.3 g/dL (ref 3.6–5.1)
Alkaline phosphatase (APISO): 59 U/L (ref 35–144)
BUN: 19 mg/dL (ref 7–25)
CO2: 30 mmol/L (ref 20–32)
Calcium: 9.7 mg/dL (ref 8.6–10.3)
Chloride: 103 mmol/L (ref 98–110)
Creat: 1.12 mg/dL (ref 0.70–1.33)
Globulin: 2.1 g/dL (calc) (ref 1.9–3.7)
Glucose, Bld: 56 mg/dL — ABNORMAL LOW (ref 65–99)
Potassium: 4.8 mmol/L (ref 3.5–5.3)
Sodium: 140 mmol/L (ref 135–146)
Total Bilirubin: 0.6 mg/dL (ref 0.2–1.2)
Total Protein: 6.4 g/dL (ref 6.1–8.1)

## 2020-05-16 LAB — LIPID PANEL
Cholesterol: 130 mg/dL (ref <200)
HDL: 55 mg/dL (ref >=40)
LDL Cholesterol (Calc): 61 mg/dL (calc)
Non-HDL Cholesterol (Calc): 75 mg/dL (calc) (ref <130)
Total CHOL/HDL Ratio: 2.4 (calc) (ref <5.0)
Triglycerides: 67 mg/dL (ref <150)

## 2020-05-16 LAB — TSH: TSH: 0.21 mIU/L — ABNORMAL LOW (ref 0.40–4.50)

## 2020-05-17 ENCOUNTER — Other Ambulatory Visit: Payer: Self-pay

## 2020-05-17 MED ORDER — LEVOTHYROXINE SODIUM 88 MCG PO TABS: 88.0000 ug | ORAL_TABLET | Freq: Every day | ORAL | 2 refills | Status: DC

## 2020-05-29 ENCOUNTER — Other Ambulatory Visit: Payer: Self-pay

## 2020-05-29 ENCOUNTER — Ambulatory Visit (INDEPENDENT_AMBULATORY_CARE_PROVIDER_SITE_OTHER): Payer: No Typology Code available for payment source | Admitting: Family Medicine

## 2020-05-29 ENCOUNTER — Encounter: Payer: Self-pay | Admitting: Family Medicine

## 2020-05-29 VITALS — BP 108/70 | HR 56 | Temp 97.8°F | Resp 18 | Ht 70.0 in | Wt 170.4 lb

## 2020-05-29 DIAGNOSIS — H9311 Tinnitus, right ear: Secondary | ICD-10-CM

## 2020-05-29 DIAGNOSIS — H9191 Unspecified hearing loss, right ear: Secondary | ICD-10-CM

## 2020-05-29 HISTORY — DX: Unspecified hearing loss, right ear: H91.91

## 2020-05-29 HISTORY — DX: Tinnitus, right ear: H93.11

## 2020-05-29 NOTE — Assessment & Plan Note (Signed)
Worsening  Refer to ent for further evaluation

## 2020-05-29 NOTE — Patient Instructions (Signed)

## 2020-05-29 NOTE — Progress Notes (Signed)
Patient ID: Bobby Gonzalez, male    DOB: 04-16-1966  Age: 54 y.o. MRN: [id number]    Subjective:  Subjective  HPI Bobby Gonzalez presents for c/o ringing in R ear x 2 weeks.  He has a hx of hearing loss and was told he needed hearing aids but never got them.  He knows his hearing is worse.    Review of Systems  Constitutional: Negative for appetite change, diaphoresis, fatigue and unexpected weight change.  HENT: Positive for hearing loss and tinnitus. Negative for ear discharge and ear pain.   Eyes: Negative for pain, redness and visual disturbance.  Respiratory: Negative for cough, chest tightness, shortness of breath and wheezing.   Cardiovascular: Negative for chest pain, palpitations and leg swelling.  Endocrine: Negative for cold intolerance, heat intolerance, polydipsia, polyphagia and polyuria.  Genitourinary: Negative for difficulty urinating, dysuria and frequency.  Neurological: Negative for dizziness, light-headedness, numbness and headaches.    History Past Medical History:  Diagnosis Date  . Allergy    pcns  . 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 radiation and chemo 05-13-12(Dr. Lamonte Gonzalez)  . Chronic kidney disease    right renal mass 7.3x6.7x6.8cm renal cell ca  . Dehydration 05/13/2012  . Difficult intubation    POSSIBLE DIFFICULT INTUBATION--S/P RADIATION FOR CANCER LEFT TONSIL--EATING BUT THROAT STILL SORE AND PT HAS THICK MUCUS  . Diverticula of colon 03/10/12   multiple rectosigmoid colonic diverticula/ ct abd/pelvis  . GERD (gastroesophageal reflux disease)   . History of radiation therapy 03/30/12-05/15/12   left tonsil  . HTN (hypertension) 06-25-12   at first start of chemo for squamous cell cancer of neck-left  . Hypothyroid 02/15/2013  . Pain    JOINT PAINS UPPER BODY AFTER RADIATION TREATMENTS  . Phlegm in throat 05/13/2012  . Renal cell carcinoma ([place]) 09/09/2012  . Renal mass 03/30/2012  .  Tonsil cancer ([place]) 02/2012   SCCa of Left tonsil-S/P biopsy on 02/17/12.    He has a past surgical history that includes Wisdom tooth extraction; Direct laryngoscopy; Gastrostomy tube placement (03/02/12); Multiple tooth extractions (03/04/12); PEG tube placement (06-25-12); tonsil biopsy (02/17/12); Laparoscopic nephrectomy (06/29/2012); PEG tube removal (10/2012); Inguinal hernia repair (Left, 11/04/2012); and Insertion of mesh (Left, 11/04/2012).   His family history includes Cancer in his father; Hypertension in his brother and father; Stroke in his father; Throat cancer in his brother; Thyroid disease in his brother.He reports that he has never smoked. He has never used smokeless tobacco. He reports that he does not drink alcohol and does not use drugs.  Current Outpatient Medications on File Prior to Visit  Medication Sig Dispense Refill  . levothyroxine (SYNTHROID) 88 MCG tablet Take 1 tablet (88 mcg total) by mouth daily. 30 tablet 2  . loratadine (CLARITIN) 10 MG tablet Take 1 tablet (10 mg total) by mouth daily. 30 tablet 11  . Multiple Vitamin (MULTIVITAMIN) tablet Take 1 tablet by mouth daily.    . pilocarpine (SALAGEN) 5 MG tablet Take 1 tablet (5 mg total) by mouth 3 (three) times daily. 90 tablet 3  . losartan (COZAAR) 25 MG tablet Take 1 tablet (25 mg total) by mouth daily. (Patient not taking: Reported on 05/29/2020) 90 tablet 1   No current facility-administered medications on file prior to visit.     Objective:  Objective  Physical Exam Vitals and nursing note reviewed.  Constitutional:      General: He is  sleeping.     Appearance: He is well-developed.  HENT:     Head: Normocephalic and atraumatic.  Eyes:     Pupils: Pupils are equal, round, and reactive to light.  Neck:     Thyroid: No thyromegaly.  Cardiovascular:     Rate and Rhythm: Normal rate and regular rhythm.     Heart sounds: No murmur heard.   Pulmonary:     Effort: Pulmonary effort is normal. No respiratory  distress.     Breath sounds: Normal breath sounds. No wheezing or rales.  Chest:     Chest wall: No tenderness.  Musculoskeletal:        General: No tenderness.     Cervical back: Normal range of motion and neck supple.  Skin:    General: Skin is warm and dry.  Neurological:     Mental Status: He is oriented to person, place, and time.  Psychiatric:        Behavior: Behavior normal.        Thought Content: Thought content normal.        Judgment: Judgment normal.    BP 108/70 (BP Location: Right Arm, Patient Position: Sitting, Cuff Size: Normal)   Pulse (!) 56   Temp 97.8 F (36.6 C) (Oral)   Resp 18   Ht 5\' 10"  (1.778 m)   Wt 170 lb 6.4 oz (77.3 kg)   SpO2 99%   BMI 24.45 kg/m  Wt Readings from Last 3 Encounters:  05/29/20 170 lb 6.4 oz (77.3 kg)  05/15/20 166 lb 3.2 oz (75.4 kg)  11/09/19 190 lb 3.2 oz (86.3 kg)     Lab Results  Component Value Date   WBC 4.8 10/11/2019   HGB 16.4 10/11/2019   HCT 48.1 10/11/2019   PLT 236.0 10/11/2019   GLUCOSE 56 (L) 05/15/2020   CHOL 130 05/15/2020   TRIG 67 05/15/2020   HDL 55 05/15/2020   LDLCALC 61 05/15/2020   ALT 15 05/15/2020   AST 17 05/15/2020   NA 140 05/15/2020   K 4.8 05/15/2020   CL 103 05/15/2020   CREATININE 1.12 05/15/2020   BUN 19 05/15/2020   CO2 30 05/15/2020   TSH 0.21 (L) 05/15/2020   PSA 0.27 10/11/2019   INR 0.94 03/02/2012    US RENAL  Result Date: 03/31/2019 CLINICAL DATA:  Stage III chronic renal disease EXAM: RENAL / URINARY TRACT ULTRASOUND COMPLETE COMPARISON:  CT abdomen and pelvis May 17, 2015 FINDINGS: Right Kidney: Surgically absent.  No lesions seen in the right pararenal fossa. Left Kidney: Renal measurements: 13.5 x 6.5 x 5.9 cm = volume: 272 mL. Echogenicity and renal cortical thickness are within normal limits. No mass, perinephric fluid, or hydronephrosis visualized. No sonographically demonstrable calculus or ureterectasis. Bladder: Appears normal for degree of bladder  distention. IMPRESSION: Status post right nephrectomy.  Normal appearing left kidney. Electronically Signed   By: Bobby Gonzalez III M.D.   On: 03/31/2019 13:00     Assessment & Plan:  Plan  I am having Bobby Gonzalez. Bobby Gonzalez maintain his multivitamin, loratadine, pilocarpine, losartan, and levothyroxine.  No orders of the defined types were placed in this encounter.   Problem List Items Addressed This Visit      Unprioritized   Hearing loss of right ear - Primary    Worsening  Refer to ent for further evaluation       Relevant Orders   Ambulatory referral to ENT   Tinnitus aurium, right  Probably from hearing loss Refer to ent       Relevant Orders   Ambulatory referral to ENT      Follow-up: Return in about 3 months (around 08/28/2020), or if symptoms worsen or fail to improve, for hypertension.  Ann Held, DO

## 2020-05-29 NOTE — Assessment & Plan Note (Signed)
Probably from hearing loss Refer to ent

## 2020-06-01 ENCOUNTER — Ambulatory Visit: Payer: No Typology Code available for payment source

## 2020-07-03 ENCOUNTER — Encounter: Payer: Self-pay | Admitting: Family Medicine

## 2020-08-15 ENCOUNTER — Other Ambulatory Visit: Payer: Self-pay | Admitting: Family Medicine

## 2020-08-24 ENCOUNTER — Other Ambulatory Visit: Payer: Self-pay

## 2020-08-24 ENCOUNTER — Ambulatory Visit (INDEPENDENT_AMBULATORY_CARE_PROVIDER_SITE_OTHER): Payer: No Typology Code available for payment source | Admitting: Family Medicine

## 2020-08-24 ENCOUNTER — Encounter: Payer: Self-pay | Admitting: Family Medicine

## 2020-08-24 VITALS — BP 114/68 | HR 60 | Temp 98.1°F | Resp 18 | Ht 70.0 in | Wt 177.2 lb

## 2020-08-24 DIAGNOSIS — E039 Hypothyroidism, unspecified: Secondary | ICD-10-CM

## 2020-08-24 DIAGNOSIS — I10 Essential (primary) hypertension: Secondary | ICD-10-CM | POA: Diagnosis not present

## 2020-08-24 DIAGNOSIS — H9191 Unspecified hearing loss, right ear: Secondary | ICD-10-CM | POA: Diagnosis not present

## 2020-08-24 DIAGNOSIS — E559 Vitamin D deficiency, unspecified: Secondary | ICD-10-CM | POA: Diagnosis not present

## 2020-08-24 LAB — VITAMIN D 25 HYDROXY (VIT D DEFICIENCY, FRACTURES): VITD: 49.62 ng/mL (ref 30.00–100.00)

## 2020-08-24 NOTE — Assessment & Plan Note (Signed)
Will need to check on referral

## 2020-08-24 NOTE — Assessment & Plan Note (Signed)
Check labs today.

## 2020-08-24 NOTE — Progress Notes (Signed)
Patient ID: Bobby Gonzalez, male    DOB: July 19, 1966  Age: 54 y.o. MRN: [id number]    Subjective:  Subjective  HPI Bobby Gonzalez presents for f/u bp and thyroid.   No complaints   Review of Systems  Constitutional: Negative for appetite change, diaphoresis, fatigue and unexpected weight change.  Eyes: Negative for pain, redness and visual disturbance.  Respiratory: Negative for cough, chest tightness, shortness of breath and wheezing.   Cardiovascular: Negative for chest pain, palpitations and leg swelling.  Endocrine: Negative for cold intolerance, heat intolerance, polydipsia, polyphagia and polyuria.  Genitourinary: Negative for difficulty urinating, dysuria and frequency.  Neurological: Negative for dizziness, light-headedness, numbness and headaches.    History Past Medical History:  Diagnosis Date  . Allergy    pcns  . 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 radiation and chemo 05-13-12(Dr. Lamonte Sakai)  . Chronic kidney disease    right renal mass 7.3x6.7x6.8cm renal cell ca  . Dehydration 05/13/2012  . Difficult intubation    POSSIBLE DIFFICULT INTUBATION--S/P RADIATION FOR CANCER LEFT TONSIL--EATING BUT THROAT STILL SORE AND PT HAS THICK MUCUS  . Diverticula of colon 03/10/12   multiple rectosigmoid colonic diverticula/ ct abd/pelvis  . GERD (gastroesophageal reflux disease)   . History of radiation therapy 03/30/12-05/15/12   left tonsil  . HTN (hypertension) 06-25-12   at first start of chemo for squamous cell cancer of neck-left  . Hypothyroid 02/15/2013  . Pain    JOINT PAINS UPPER BODY AFTER RADIATION TREATMENTS  . Phlegm in throat 05/13/2012  . Renal cell carcinoma ([place]) 09/09/2012  . Renal mass 03/30/2012  . Tonsil cancer ([place]) 02/2012   SCCa of Left tonsil-S/P biopsy on 02/17/12.    He has a past surgical history that includes Wisdom tooth extraction; Direct laryngoscopy; Gastrostomy tube placement  (03/02/12); Multiple tooth extractions (03/04/12); PEG tube placement (06-25-12); tonsil biopsy (02/17/12); Laparoscopic nephrectomy (06/29/2012); PEG tube removal (10/2012); Inguinal hernia repair (Left, 11/04/2012); and Insertion of mesh (Left, 11/04/2012).   His family history includes Cancer in his father; Hypertension in his brother and father; Stroke in his father; Throat cancer in his brother; Thyroid disease in his brother.He reports that he has never smoked. He has never used smokeless tobacco. He reports that he does not drink alcohol and does not use drugs.  Current Outpatient Medications on File Prior to Visit  Medication Sig Dispense Refill  . levothyroxine (SYNTHROID) 88 MCG tablet TAKE 1 TABLET (88 MCG TOTAL) BY MOUTH DAILY. 30 tablet 2  . loratadine (CLARITIN) 10 MG tablet Take 1 tablet (10 mg total) by mouth daily. 30 tablet 11  . Multiple Vitamin (MULTIVITAMIN) tablet Take 1 tablet by mouth daily.    . pilocarpine (SALAGEN) 5 MG tablet Take 1 tablet (5 mg total) by mouth 3 (three) times daily. 90 tablet 3   No current facility-administered medications on file prior to visit.     Objective:  Objective  Physical Exam Vitals and nursing note reviewed.  Constitutional:      General: He is sleeping. Vital signs are normal.     Appearance: He is well-developed and well-nourished.  HENT:     Head: Normocephalic and atraumatic.     Mouth/Throat:     Mouth: Oropharynx is clear and moist.  Eyes:     Extraocular Movements: EOM normal.     Pupils: Pupils are equal, round, and reactive to light.  Neck:  Thyroid: No thyromegaly.  Cardiovascular:     Rate and Rhythm: Normal rate and regular rhythm.     Heart sounds: No murmur heard.   Pulmonary:     Effort: Pulmonary effort is normal. No respiratory distress.     Breath sounds: Normal breath sounds. No wheezing or rales.  Chest:     Chest wall: No tenderness.  Musculoskeletal:        General: No tenderness or edema.     Cervical  back: Normal range of motion and neck supple.  Skin:    General: Skin is warm and dry.  Neurological:     Mental Status: He is oriented to person, place, and time.  Psychiatric:        Mood and Affect: Mood and affect normal.        Behavior: Behavior normal.        Thought Content: Thought content normal.        Judgment: Judgment normal.    BP 114/68 (BP Location: Right Arm, Patient Position: Sitting, Cuff Size: Normal)   Pulse 60   Temp 98.1 F (36.7 C) (Oral)   Resp 18   Ht 5\' 10"  (1.778 m)   Wt 177 lb 3.2 oz (80.4 kg)   SpO2 96%   BMI 25.43 kg/m  Wt Readings from Last 3 Encounters:  08/24/20 177 lb 3.2 oz (80.4 kg)  05/29/20 170 lb 6.4 oz (77.3 kg)  05/15/20 166 lb 3.2 oz (75.4 kg)     Lab Results  Component Value Date   WBC 4.8 10/11/2019   HGB 16.4 10/11/2019   HCT 48.1 10/11/2019   PLT 236.0 10/11/2019   GLUCOSE 56 (L) 05/15/2020   CHOL 130 05/15/2020   TRIG 67 05/15/2020   HDL 55 05/15/2020   LDLCALC 61 05/15/2020   ALT 15 05/15/2020   AST 17 05/15/2020   NA 140 05/15/2020   K 4.8 05/15/2020   CL 103 05/15/2020   CREATININE 1.12 05/15/2020   BUN 19 05/15/2020   CO2 30 05/15/2020   TSH 0.21 (L) 05/15/2020   PSA 0.27 10/11/2019   INR 0.94 03/02/2012    US RENAL  Result Date: 03/31/2019 CLINICAL DATA:  Stage III chronic renal disease EXAM: RENAL / URINARY TRACT ULTRASOUND COMPLETE COMPARISON:  CT abdomen and pelvis May 17, 2015 FINDINGS: Right Kidney: Surgically absent.  No lesions seen in the right pararenal fossa. Left Kidney: Renal measurements: 13.5 x 6.5 x 5.9 cm = volume: 272 mL. Echogenicity and renal cortical thickness are within normal limits. No mass, perinephric fluid, or hydronephrosis visualized. No sonographically demonstrable calculus or ureterectasis. Bladder: Appears normal for degree of bladder distention. IMPRESSION: Status post right nephrectomy.  Normal appearing left kidney. Electronically Signed   By: Lowella Grip III M.D.    On: 03/31/2019 13:00     Assessment & Plan:  Plan  I have discontinued Bobby Gonzalez. Bobby Gonzalez's losartan. I am also having him maintain his multivitamin, loratadine, pilocarpine, and levothyroxine.  No orders of the defined types were placed in this encounter.   Problem List Items Addressed This Visit      Unprioritized   Essential hypertension    Well controlled, no changes to meds. Encouraged heart healthy diet such as the DASH diet and exercise as tolerated.       Hearing loss of right ear    Will need to check on referral       Hypothyroid - Primary    Check labs today  Relevant Orders   Thyroid Panel With TSH    Other Visit Diagnoses    Vitamin D deficiency       Relevant Orders   Vitamin D (25 hydroxy)   Primary hypertension          Follow-up: Return if symptoms worsen or fail to improve.  Ann Held, DO

## 2020-08-24 NOTE — Assessment & Plan Note (Signed)
Well controlled, no changes to meds. Encouraged heart healthy diet such as the DASH diet and exercise as tolerated.  °

## 2020-08-24 NOTE — Patient Instructions (Signed)
Hypothyroidism  Hypothyroidism is when the thyroid gland does not make enough of certain hormones (it is underactive). The thyroid gland is a small gland located in the lower front part of the neck, just in front of the windpipe (trachea). This gland makes hormones that help control how the body uses food for energy (metabolism) as well as how the heart and brain function. These hormones also play a role in keeping your bones strong. When the thyroid is underactive, it produces too little of the hormones thyroxine (T4) and triiodothyronine (T3). What are the causes? This condition may be caused by:  Hashimoto's disease. This is a disease in which the body's disease-fighting system (immune system) attacks the thyroid gland. This is the most common cause.  Viral infections.  Pregnancy.  Certain medicines.  Birth defects.  Past radiation treatments to the head or neck for cancer.  Past treatment with radioactive iodine.  Past exposure to radiation in the environment.  Past surgical removal of part or all of the thyroid.  Problems with a gland in the center of the brain (pituitary gland).  Lack of enough iodine in the diet. What increases the risk? You are more likely to develop this condition if:  You are male.  You have a family history of thyroid conditions.  You use a medicine called lithium.  You take medicines that affect the immune system (immunosuppressants). What are the signs or symptoms? Symptoms of this condition include:  Feeling as though you have no energy (lethargy).  Not being able to tolerate cold.  Weight gain that is not explained by a change in diet or exercise habits.  Lack of appetite.  Dry skin.  Coarse hair.  Menstrual irregularity.  Slowing of thought processes.  Constipation.  Sadness or depression. How is this diagnosed? This condition may be diagnosed based on:  Your symptoms, your medical history, and a physical exam.  Blood  tests. You may also have imaging tests, such as an ultrasound or MRI. How is this treated? This condition is treated with medicine that replaces the thyroid hormones that your body does not make. After you begin treatment, it may take several weeks for symptoms to go away. Follow these instructions at home:  Take over-the-counter and prescription medicines only as told by your health care provider.  If you start taking any new medicines, tell your health care provider.  Keep all follow-up visits as told by your health care provider. This is important. ? As your condition improves, your dosage of thyroid hormone medicine may change. ? You will need to have blood tests regularly so that your health care provider can monitor your condition. Contact a health care provider if:  Your symptoms do not get better with treatment.  You are taking thyroid replacement medicine and you: ? Sweat a lot. ? Have tremors. ? Feel anxious. ? Lose weight rapidly. ? Cannot tolerate heat. ? Have emotional swings. ? Have diarrhea. ? Feel weak. Get help right away if you have:  Chest pain.  An irregular heartbeat.  A rapid heartbeat.  Difficulty breathing. Summary  Hypothyroidism is when the thyroid gland does not make enough of certain hormones (it is underactive).  When the thyroid is underactive, it produces too little of the hormones thyroxine (T4) and triiodothyronine (T3).  The most common cause is Hashimoto's disease, a disease in which the body's disease-fighting system (immune system) attacks the thyroid gland. The condition can also be caused by viral infections, medicine, pregnancy, or past   radiation treatment to the head or neck.  Symptoms may include weight gain, dry skin, constipation, feeling as though you do not have energy, and not being able to tolerate cold.  This condition is treated with medicine to replace the thyroid hormones that your body does not make. This information  is not intended to replace advice given to you by your health care provider. Make sure you discuss any questions you have with your health care provider. Document Revised: 08/01/2017 Document Reviewed: 07/30/2017 Elsevier Patient Education  2020 Elsevier Inc.  

## 2020-08-25 LAB — THYROID PANEL WITH TSH
Free Thyroxine Index: 3 (ref 1.4–3.8)
T3 Uptake: 36 % — ABNORMAL HIGH (ref 22–35)
T4, Total: 8.3 ug/dL (ref 4.9–10.5)
TSH: 2.58 mIU/L (ref 0.40–4.50)

## 2020-09-20 ENCOUNTER — Encounter: Payer: Self-pay | Admitting: Family Medicine

## 2020-09-20 ENCOUNTER — Other Ambulatory Visit: Payer: Self-pay

## 2020-09-20 ENCOUNTER — Ambulatory Visit (INDEPENDENT_AMBULATORY_CARE_PROVIDER_SITE_OTHER): Payer: No Typology Code available for payment source | Admitting: Family Medicine

## 2020-09-20 VITALS — BP 122/72 | HR 73 | Temp 98.5°F | Ht 70.0 in | Wt 179.0 lb

## 2020-09-20 DIAGNOSIS — R55 Syncope and collapse: Secondary | ICD-10-CM | POA: Diagnosis not present

## 2020-09-20 LAB — COMPREHENSIVE METABOLIC PANEL
ALT: 23 U/L (ref 0–53)
AST: 18 U/L (ref 0–37)
Albumin: 4.6 g/dL (ref 3.5–5.2)
Alkaline Phosphatase: 67 U/L (ref 39–117)
BUN: 16 mg/dL (ref 6–23)
CO2: 32 mEq/L (ref 19–32)
Calcium: 9.9 mg/dL (ref 8.4–10.5)
Chloride: 101 mEq/L (ref 96–112)
Creatinine, Ser: 1.32 mg/dL (ref 0.40–1.50)
GFR: 61.38 mL/min (ref >60.00)
Glucose, Bld: 101 mg/dL — ABNORMAL HIGH (ref 70–99)
Potassium: 4.3 mEq/L (ref 3.5–5.1)
Sodium: 137 mEq/L (ref 135–145)
Total Bilirubin: 0.5 mg/dL (ref 0.2–1.2)
Total Protein: 6.8 g/dL (ref 6.0–8.3)

## 2020-09-20 LAB — CBC
HCT: 48.7 % (ref 39.0–52.0)
Hemoglobin: 16.6 g/dL (ref 13.0–17.0)
MCHC: 34 g/dL (ref 30.0–36.0)
MCV: 91 fl (ref 78.0–100.0)
Platelets: 229 10*3/uL (ref 150.0–400.0)
RBC: 5.36 Mil/uL (ref 4.22–5.81)
RDW: 13.2 % (ref 11.5–15.5)
WBC: 5.7 10*3/uL (ref 4.0–10.5)

## 2020-09-20 LAB — TSH: TSH: 2.84 u[IU]/mL (ref 0.35–4.50)

## 2020-09-20 NOTE — Progress Notes (Signed)
Chief Complaint  Patient presents with  . Loss of Consciousness  . Dizziness    Subjective: Patient is a 55 y.o. male here for LOC.  Yesterday he was brushing his teeth and flexed in the mirror. He felt a sharp pain on his L side and it travelled upwards. He started to feel dizzy and tried to sit on the tub. He did not make it and lost consciousness. He did not lose control of his bowel/bladder function or bite his tongue. He was out for around 10-15 seconds, no shaking. He had not been ill. Eating and drinking normally. He passed out after landing on his elbow 30 years ago. No hx of heart disease. No SOB or heart skipping beats.   Past Medical History:  Diagnosis Date  . Allergy    pcns  . 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 radiation and chemo 05-13-12(Dr. Lamonte Sakai)  . Chronic kidney disease    right renal mass 7.3x6.7x6.8cm renal cell ca  . Dehydration 05/13/2012  . Difficult intubation    POSSIBLE DIFFICULT INTUBATION--S/P RADIATION FOR CANCER LEFT TONSIL--EATING BUT THROAT STILL SORE AND PT HAS THICK MUCUS  . Diverticula of colon 03/10/12   multiple rectosigmoid colonic diverticula/ ct abd/pelvis  . GERD (gastroesophageal reflux disease)   . History of radiation therapy 03/30/12-05/15/12   left tonsil  . HTN (hypertension) 06-25-12   at first start of chemo for squamous cell cancer of neck-left  . Hypothyroid 02/15/2013  . Pain    JOINT PAINS UPPER BODY AFTER RADIATION TREATMENTS  . Phlegm in throat 05/13/2012  . Renal cell carcinoma ([place]) 09/09/2012  . Renal mass 03/30/2012  . Tonsil cancer ([place]) 02/2012   SCCa of Left tonsil-S/P biopsy on 02/17/12.    Objective: BP 122/72 (BP Location: Left Arm, Patient Position: Sitting, Cuff Size: Normal)   Pulse 73   Temp 98.5 F (36.9 C) (Oral)   Ht 5\' 10"  (1.778 m)   Wt 179 lb (81.2 kg)   SpO2 97%   BMI 25.68 kg/m  General: Awake, appears stated age HEENT: MMM, no  subgloss icterus, no scleral icterus Heart: RRR, no bruits, no LE edema MSK: NO ttp over L side of torso (where he felt pain yesterday) Lungs: CTAB, no rales, wheezes or rhonchi. No accessory muscle use Psych: Age appropriate judgment and insight, normal affect and mood  Assessment and Plan: Syncope, unspecified syncope type - Plan: CBC, Comprehensive metabolic panel, TSH, EKG 30-ZSWF, Ambulatory referral to Cardiology  EKG shows Sinus brady, nml axis, no signs of ischemia, no interv abn, good R wave progression. Ck above labs. Stay hydrated. Likely vasovagal syncope 2/2 pain from L side. Will refer to cards for discussion of this also. Does not sound neuro-related.  F/u prn.  The patient voiced understanding and agreement to the plan.  [place], DO 09/20/20  2:25 PM

## 2020-09-20 NOTE — Patient Instructions (Signed)
If you do not hear anything about your referral in the next 1-2 weeks, call our office and ask for an update.  Give [place] 2-3 business days to get the results of your labs back.   Keep the diet clean and stay active.  Stay hydrated.  Please let me know if anything changes.  Let us know if you need anything.

## 2020-09-27 DIAGNOSIS — R52 Pain, unspecified: Secondary | ICD-10-CM | POA: Insufficient documentation

## 2020-09-27 DIAGNOSIS — C649 Malignant neoplasm of unspecified kidney, except renal pelvis: Secondary | ICD-10-CM

## 2020-09-27 DIAGNOSIS — C801 Malignant (primary) neoplasm, unspecified: Secondary | ICD-10-CM | POA: Insufficient documentation

## 2020-09-27 DIAGNOSIS — T884XXA Failed or difficult intubation, initial encounter: Secondary | ICD-10-CM | POA: Insufficient documentation

## 2020-09-27 DIAGNOSIS — Z85528 Personal history of other malignant neoplasm of kidney: Secondary | ICD-10-CM

## 2020-09-27 DIAGNOSIS — K219 Gastro-esophageal reflux disease without esophagitis: Secondary | ICD-10-CM | POA: Insufficient documentation

## 2020-09-27 DIAGNOSIS — Z85819 Personal history of malignant neoplasm of unspecified site of lip, oral cavity, and pharynx: Secondary | ICD-10-CM

## 2020-09-27 DIAGNOSIS — E039 Hypothyroidism, unspecified: Secondary | ICD-10-CM

## 2020-09-27 DIAGNOSIS — Z923 Personal history of irradiation: Secondary | ICD-10-CM | POA: Insufficient documentation

## 2020-09-27 DIAGNOSIS — C099 Malignant neoplasm of tonsil, unspecified: Secondary | ICD-10-CM

## 2020-09-27 DIAGNOSIS — R911 Solitary pulmonary nodule: Secondary | ICD-10-CM

## 2020-09-27 HISTORY — DX: Hypothyroidism, unspecified: E03.9

## 2020-09-27 HISTORY — DX: Solitary pulmonary nodule: R91.1

## 2020-09-27 HISTORY — DX: Personal history of other malignant neoplasm of kidney: Z85.528

## 2020-09-27 HISTORY — DX: Malignant neoplasm of unspecified kidney, except renal pelvis: C64.9

## 2020-09-27 HISTORY — DX: Malignant neoplasm of tonsil, unspecified: C09.9

## 2020-09-27 HISTORY — DX: Personal history of malignant neoplasm of unspecified site of lip, oral cavity, and pharynx: Z85.819

## 2020-09-29 ENCOUNTER — Encounter: Payer: Self-pay | Admitting: Cardiology

## 2020-09-29 ENCOUNTER — Ambulatory Visit (INDEPENDENT_AMBULATORY_CARE_PROVIDER_SITE_OTHER): Payer: No Typology Code available for payment source

## 2020-09-29 ENCOUNTER — Ambulatory Visit (INDEPENDENT_AMBULATORY_CARE_PROVIDER_SITE_OTHER): Payer: No Typology Code available for payment source | Admitting: Cardiology

## 2020-09-29 ENCOUNTER — Other Ambulatory Visit: Payer: Self-pay

## 2020-09-29 DIAGNOSIS — R011 Cardiac murmur, unspecified: Secondary | ICD-10-CM | POA: Insufficient documentation

## 2020-09-29 DIAGNOSIS — R55 Syncope and collapse: Secondary | ICD-10-CM

## 2020-09-29 DIAGNOSIS — Z905 Acquired absence of kidney: Secondary | ICD-10-CM

## 2020-09-29 HISTORY — DX: Acquired absence of kidney: Z90.5

## 2020-09-29 HISTORY — DX: Cardiac murmur, unspecified: R01.1

## 2020-09-29 HISTORY — DX: Syncope and collapse: R55

## 2020-09-29 NOTE — Patient Instructions (Signed)
Medication Instructions:  No medication changes. *If you need a refill on your cardiac medications before your next appointment, please call your pharmacy*   Lab Work: None ordered If you have labs (blood work) drawn today and your tests are completely normal, you will receive your results only by: Marland Kitchen MyChart Message (if you have MyChart) OR . A paper copy in the mail If you have any lab test that is abnormal or we need to change your treatment, we will call you to review the results.   Testing/Procedures: Your physician has requested that you have an echocardiogram. Echocardiography is a painless test that uses sound waves to create images of your heart. It provides your doctor with information about the size and shape of your heart and how well your heart's chambers and valves are working. This procedure takes approximately one hour. There are no restrictions for this procedure.   WHY IS MY DOCTOR PRESCRIBING ZIO? The Zio system is proven and trusted by physicians to detect and diagnose irregular heart rhythms -- and has been prescribed to hundreds of thousands of patients.  The FDA has cleared the Zio system to monitor for many different kinds of irregular heart rhythms. In a study, physicians were able to reach a diagnosis 90% of the time with the Zio system1.  You can wear the Zio monitor -- a small, discreet, comfortable patch -- during your normal day-to-day activity, including while you sleep, shower, and exercise, while it records every single heartbeat for analysis.  1Barrett, P., et al. Comparison of 24 Hour Holter Monitoring Versus 14 Day Novel Adhesive Patch Electrocardiographic Monitoring. [place], 2014.  ZIO VS. HOLTER MONITORING The Zio monitor can be comfortably worn for up to 14 days. Holter monitors can be worn for 24 to 48 hours, limiting the time to record any irregular heart rhythms you may have. Zio is able to capture data for the 51% of patients  who have their first symptom-triggered arrhythmia after 48 hours.1  LIVE WITHOUT RESTRICTIONS The Zio ambulatory cardiac monitor is a small, unobtrusive, and water-resistant patch--you might even forget you're wearing it. The Zio monitor records and stores every beat of your heart, whether you're sleeping, working out, or showering. Wear the monitor for 2 weeks, remove 10/13/20).   Follow-Up: At [facility], you and your health needs are our priority.  As part of our continuing mission to provide you with exceptional heart care, we have created designated Provider Care Teams.  These Care Teams include your primary Cardiologist (physician) and Advanced Practice Providers (APPs -  Physician Assistants and Nurse Practitioners) who all work together to provide you with the care you need, when you need it.  We recommend signing up for the patient portal called "MyChart".  Sign up information is provided on this After Visit Summary.  MyChart is used to connect with patients for Virtual Visits (Telemedicine).  Patients are able to view lab/test results, encounter notes, upcoming appointments, etc.  Non-urgent messages can be sent to your provider as well.   To learn more about what you can do with MyChart, go to [email or web address].    Your next appointment:   3 month(s)  The format for your next appointment:   In Person  Provider:   Jyl Heinz, MD   Other Instructions  Echocardiogram An echocardiogram is a test that uses sound waves (ultrasound) to produce images of the heart. Images from an echocardiogram can provide important information about:  Heart size and shape.  The size and thickness and movement of your heart's walls.  Heart muscle function and strength.  Heart valve function or if you have stenosis. Stenosis is when the heart valves are too narrow.  If blood is flowing backward through the heart valves (regurgitation).  A tumor or infectious growth around the  heart valves.  Areas of heart muscle that are not working well because of poor blood flow or injury from a heart attack.  Aneurysm detection. An aneurysm is a weak or damaged part of an artery wall. The wall bulges out from the normal force of blood pumping through the body. Tell a health care provider about:  Any allergies you have.  All medicines you are taking, including vitamins, herbs, eye drops, creams, and over-the-counter medicines.  Any blood disorders you have.  Any surgeries you have had.  Any medical conditions you have.  Whether you are pregnant or may be pregnant. What are the risks? Generally, this is a safe test. However, problems may occur, including an allergic reaction to dye (contrast) that may be used during the test. What happens before the test? No specific preparation is needed. You may eat and drink normally. What happens during the test?  You will take off your clothes from the waist up and put on a hospital gown.  Electrodes or electrocardiogram (ECG)patches may be placed on your chest. The electrodes or patches are then connected to a device that monitors your heart rate and rhythm.  You will lie down on a table for an ultrasound exam. A gel will be applied to your chest to help sound waves pass through your skin.  A handheld device, called a transducer, will be pressed against your chest and moved over your heart. The transducer produces sound waves that travel to your heart and bounce back (or "echo" back) to the transducer. These sound waves will be captured in real-time and changed into images of your heart that can be viewed on a video monitor. The images will be recorded on a computer and reviewed by your health care provider.  You may be asked to change positions or hold your breath for a short time. This makes it easier to get different views or better views of your heart.  In some cases, you may receive contrast through an IV in one of your  veins. This can improve the quality of the pictures from your heart. The procedure may vary among health care providers and hospitals.   What can I expect after the test? You may return to your normal, everyday life, including diet, activities, and medicines, unless your health care provider tells you not to do that. Follow these instructions at home:  It is up to you to get the results of your test. Ask your health care provider, or the department that is doing the test, when your results will be ready.  Keep all follow-up visits. This is important. Summary  An echocardiogram is a test that uses sound waves (ultrasound) to produce images of the heart.  Images from an echocardiogram can provide important information about the size and shape of your heart, heart muscle function, heart valve function, and other possible heart problems.  You do not need to do anything to prepare before this test. You may eat and drink normally.  After the echocardiogram is completed, you may return to your normal, everyday life, unless your health care provider tells you not to do that. This information is not intended to replace advice  given to you by your health care provider. Make sure you discuss any questions you have with your health care provider. Document Revised: 04/11/2020 Document Reviewed: 04/11/2020 Elsevier Patient Education  2021 [employer].

## 2020-09-29 NOTE — Progress Notes (Signed)
Cardiology Office Note:    Date:  09/29/2020   ID:  Bobby Gonzalez, DOB 26-Dec-1965, MRN [id number]  PCP:  Carollee Herter, Alferd Apa, DO  Cardiologist:  Jenean Lindau, MD   Referring MD: Shelda Pal*    ASSESSMENT:    1. Syncope, unspecified syncope type   2. Solitary kidney, acquired   3. Cardiac murmur    PLAN:    In order of problems listed above:  1. Primary prevention stressed with the patient.  Importance of compliance with diet medication stressed any vocalized understanding. 2. Syncope: I reviewed my findings with the patient at length.  Physical examination is unremarkable.  TSH is unremarkable..  I will do a 2-week monitor to understand his symptoms.  He knows to go to the nearest emergency room for any concerning symptoms.  In view of syncope I told him not to drive until cleared by his primary care provider. 3. Cardiac murmur: Echocardiogram will be done to assess her murmur on auscultation 4. His blood pressure stable and lipids are also fine.  I told him to give himself well-hydrated especially in warm weather months in view of the fact that he is outside for extended period of time, 5. He be seen follow-up appointment in 2 months or earlier if he has any concerns.  He knows to go to the nearest emergency room for any concerning symptoms.  Multiple questions which were answered to satisfaction.   Medication Adjustments/Labs and Tests Ordered: Current medicines are reviewed at length with the patient today.  Concerns regarding medicines are outlined above.  Orders Placed This Encounter  Procedures  . LONG TERM MONITOR (3-14 DAYS)  . EKG 12-Lead  . ECHOCARDIOGRAM COMPLETE   No orders of the defined types were placed in this encounter.    History of Present Illness:    Bobby Gonzalez is a 55 y.o. male who is being seen today for the evaluation of syncope at the request of Shelda Pal*.  Patient is a pleasant 55 year old male.  He has past  medical history of renal cell and tonsil cancer.  He has survived this and is in remission.  He underwent nephrectomy for the same.  He mentions to me that he was brushing his teeth the other day and felt uneasy and passed out for only about a second or 2 and subsequently he woke up.  His wife attended to him.  No chest pain orthopnea or PND.  He is an active gentleman.  He runs a [employer].  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  At the time of my evaluation, the patient is alert awake oriented and in no distress.  Past Medical History:  Diagnosis Date  . Acquired hypothyroidism 09/27/2020  . Allergy    pcns  . 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 radiation and chemo 05-13-12(Dr. Lamonte Sakai)  . Chronic kidney disease    right renal mass 7.3x6.7x6.8cm renal cell ca  . Dehydration 05/13/2012  . Difficult intubation    POSSIBLE DIFFICULT INTUBATION--S/P RADIATION FOR CANCER LEFT TONSIL--EATING BUT THROAT STILL SORE AND PT HAS THICK MUCUS  . Diverticula of colon 03/10/12   multiple rectosigmoid colonic diverticula/ ct abd/pelvis  . Dry mouth 05/16/2014  . Essential hypertension 10/11/2019  . Gastro-esophageal reflux disease without esophagitis   . GERD (gastroesophageal reflux disease)   .  Hearing loss of right ear 05/29/2020  . History of cancer tonsil 09/20/2016  . History of head and neck radiation 09/20/2016  . History of radiation therapy 03/30/12-05/15/12   left tonsil  . HTN (hypertension) 06-25-12   at first start of chemo for squamous cell cancer of neck-left  . Hypothyroid 02/15/2013  . Inguinal hernia 10/23/2012  . Malignant tumor of kidney ([place]) 09/27/2020  . 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[place]) 09/27/2020  . Multiple lung nodules on CT 05/16/2014  . Pain    JOINT PAINS UPPER BODY AFTER RADIATION TREATMENTS  . Personal history of malignant neoplasm of unspecified  site of lip, oral cavity, and pharynx 09/27/2020  . Personal history of other malignant neoplasm of kidney 09/27/2020  . Phlegm in throat 05/13/2012  . Preventive measure 05/16/2014  . Renal cell carcinoma ([place]) 09/09/2012  . Renal mass 03/30/2012  . Sensorineural hearing loss, bilateral 09/20/2016  . Solitary pulmonary nodule 09/27/2020  . Tinnitus aurium, right 05/29/2020  . Tonsil cancer ([place]) 02/2012   SCCa of Left tonsil-S/P biopsy on 02/17/12.    Past Surgical History:  Procedure Laterality Date  . DIRECT LARYNGOSCOPY     S/P DL, Biopsy-Dr. Constance Holster. Pathology postive for SCCa of Left Tonsil  . GASTROSTOMY TUBE PLACEMENT  03/02/12  . INGUINAL HERNIA REPAIR Left 11/04/2012   Procedure: HERNIA REPAIR INGUINAL ADULT;  Surgeon: Joyice Faster. Cornett, MD;  Location: [facility];  Service: General;  Laterality: Left;  . INSERTION OF MESH Left 11/04/2012   Procedure: INSERTION OF MESH;  Surgeon: Joyice Faster. Cornett, MD;  Location: [facility];  Service: General;  Laterality: Left;  . LAPAROSCOPIC NEPHRECTOMY  06/29/2012   Procedure: LAPAROSCOPIC NEPHRECTOMY;  Surgeon: Dutch Gray, MD;  Location: [facility];  Service: Urology;  Laterality: Right;  . MULTIPLE TOOTH EXTRACTIONS  03/04/12   with DR. Mohorn  . PEG TUBE PLACEMENT  06-25-12   remains in place abdomen-not using at present.  . PEG TUBE REMOVAL  10/2012  . tonsil biopsy  02/17/12   SCCa left tomnsil,HPV positive,spread to l cervical node  . WISDOM TOOTH EXTRACTION      Current Medications: Current Meds  Medication Sig  . cetirizine (ZYRTEC) 10 MG tablet Take 10 mg by mouth daily.  Marland Kitchen levothyroxine (SYNTHROID) 88 MCG tablet TAKE 1 TABLET (88 MCG TOTAL) BY MOUTH DAILY.  . Multiple Vitamin (MULTIVITAMIN) tablet Take 1 tablet by mouth daily.     Allergies:   Banana and Penicillins   Social History   Socioeconomic History  . Marital status: Married    Spouse name: Not on file  . Number of children: 2  . Years of education: Not on file  . Highest education  level: Not on file  Occupational History    Employer: [employer]    Comment: car saleman  Tobacco Use  . Smoking status: Never Smoker  . Smokeless tobacco: Never Used  Substance and Sexual Activity  . Alcohol use: No    Comment: Never drank alocohol.  . Drug use: No  . Sexual activity: Yes  Other Topics Concern  . Not on file  Social History Narrative   The patient is married and has 2 daughters.   Patient has never been a smoker.   Patient denies use of smokeless tobacco.   Patient has never drank alcohol.   Social Determinants of Health   Financial Resource Strain: Not on file  Food Insecurity: Not on file  Transportation Needs: Not on file  Physical Activity:  Not on file  Stress: Not on file  Social Connections: Not on file     Family History: The patient's family history includes Cancer in his father; Hypertension in his brother and father; Stroke in his father; Throat cancer in his brother; Thyroid disease in his brother. There is no history of Colon cancer, Esophageal cancer, Rectal cancer, or Stomach cancer.  ROS:   Please see the history of present illness.    All other systems reviewed and are negative.  EKGs/Labs/Other Studies Reviewed:    The following studies were reviewed today: I discussed my findings with the patient at length.  EKG reveals sinus rhythm and nonspecific ST-T changes   Recent Labs: 09/20/2020: ALT 23; BUN 16; Creatinine, Ser 1.32; Hemoglobin 16.6; Platelets 229.0; Potassium 4.3; Sodium 137; TSH 2.84  Recent Lipid Panel    Component Value Date/Time   CHOL 130 05/15/2020 0929   TRIG 67 05/15/2020 0929   HDL 55 05/15/2020 0929   CHOLHDL 2.4 05/15/2020 0929   VLDL 23.8 10/11/2019 0936   LDLCALC 61 05/15/2020 0929    Physical Exam:    VS:  BP 128/88   Pulse 74   Ht 5\' 10"  (1.778 m)   Wt 183 lb (83 kg)   SpO2 97%   BMI 26.26 kg/m     Wt Readings from Last 3 Encounters:  09/29/20 183 lb (83 kg)  09/20/20 179 lb (81.2  kg)  08/24/20 177 lb 3.2 oz (80.4 kg)     GEN: Patient is in no acute distress HEENT: Normal NECK: No JVD; No carotid bruits LYMPHATICS: No lymphadenopathy CARDIAC: S1 S2 regular, 2/6 systolic murmur at the apex. RESPIRATORY:  Clear to auscultation without rales, wheezing or rhonchi  ABDOMEN: Soft, non-tender, non-distended MUSCULOSKELETAL:  No edema; No deformity  SKIN: Warm and dry NEUROLOGIC:  Alert and oriented x 3 PSYCHIATRIC:  Normal affect    Signed, Jenean Lindau, MD  09/29/2020 3:32 PM    [facility]

## 2020-10-18 ENCOUNTER — Other Ambulatory Visit (HOSPITAL_COMMUNITY): Payer: Self-pay | Admitting: Family Medicine

## 2020-10-30 ENCOUNTER — Ambulatory Visit (HOSPITAL_BASED_OUTPATIENT_CLINIC_OR_DEPARTMENT_OTHER)
Admission: RE | Admit: 2020-10-30 | Discharge: 2020-10-30 | Disposition: A | Discharge status: Home / Self Care | Payer: No Typology Code available for payment source | Source: Ambulatory Visit | Attending: Cardiology | Admitting: Cardiology

## 2020-10-30 ENCOUNTER — Other Ambulatory Visit: Payer: Self-pay

## 2020-10-30 DIAGNOSIS — R55 Syncope and collapse: Secondary | ICD-10-CM | POA: Diagnosis not present

## 2020-10-30 LAB — ECHOCARDIOGRAM COMPLETE
Area-P 1/2: 3.99 cm2
S' Lateral: 2.35 cm

## 2020-11-10 ENCOUNTER — Other Ambulatory Visit: Payer: Self-pay | Admitting: Family Medicine

## 2020-11-13 ENCOUNTER — Other Ambulatory Visit: Payer: Self-pay

## 2020-11-13 ENCOUNTER — Encounter: Payer: Self-pay | Admitting: Family Medicine

## 2020-11-13 ENCOUNTER — Ambulatory Visit (INDEPENDENT_AMBULATORY_CARE_PROVIDER_SITE_OTHER): Payer: No Typology Code available for payment source | Admitting: Family Medicine

## 2020-11-13 VITALS — BP 110/80 | HR 58 | Temp 98.1°F | Resp 16 | Ht 70.0 in | Wt 185.4 lb

## 2020-11-13 DIAGNOSIS — Z1159 Encounter for screening for other viral diseases: Secondary | ICD-10-CM | POA: Diagnosis not present

## 2020-11-13 DIAGNOSIS — Z0000 Encounter for general adult medical examination without abnormal findings: Secondary | ICD-10-CM

## 2020-11-13 DIAGNOSIS — E039 Hypothyroidism, unspecified: Secondary | ICD-10-CM

## 2020-11-13 DIAGNOSIS — Z125 Encounter for screening for malignant neoplasm of prostate: Secondary | ICD-10-CM

## 2020-11-13 DIAGNOSIS — Z87898 Personal history of other specified conditions: Secondary | ICD-10-CM

## 2020-11-13 DIAGNOSIS — R42 Dizziness and giddiness: Secondary | ICD-10-CM

## 2020-11-13 DIAGNOSIS — N183 Chronic kidney disease, stage 3 unspecified: Secondary | ICD-10-CM

## 2020-11-13 DIAGNOSIS — R55 Syncope and collapse: Secondary | ICD-10-CM

## 2020-11-13 DIAGNOSIS — C099 Malignant neoplasm of tonsil, unspecified: Secondary | ICD-10-CM

## 2020-11-13 DIAGNOSIS — H903 Sensorineural hearing loss, bilateral: Secondary | ICD-10-CM

## 2020-11-13 DIAGNOSIS — I10 Essential (primary) hypertension: Secondary | ICD-10-CM

## 2020-11-13 HISTORY — DX: Dizziness and giddiness: R42

## 2020-11-13 LAB — CBC WITH DIFFERENTIAL/PLATELET
Basophils Absolute: 0 10*3/uL (ref 0.0–0.1)
Basophils Relative: 0.3 % (ref 0.0–3.0)
Eosinophils Absolute: 0.3 10*3/uL (ref 0.0–0.7)
Eosinophils Relative: 4.9 % (ref 0.0–5.0)
HCT: 46.8 % (ref 39.0–52.0)
Hemoglobin: 15.9 g/dL (ref 13.0–17.0)
Lymphocytes Relative: 10.9 % — ABNORMAL LOW (ref 12.0–46.0)
Lymphs Abs: 0.7 10*3/uL (ref 0.7–4.0)
MCHC: 33.9 g/dL (ref 30.0–36.0)
MCV: 91 fl (ref 78.0–100.0)
Monocytes Absolute: 0.5 10*3/uL (ref 0.1–1.0)
Monocytes Relative: 9 % (ref 3.0–12.0)
Neutro Abs: 4.5 10*3/uL (ref 1.4–7.7)
Neutrophils Relative %: 74.9 % (ref 43.0–77.0)
Platelets: 198 10*3/uL (ref 150.0–400.0)
RBC: 5.15 Mil/uL (ref 4.22–5.81)
RDW: 13.2 % (ref 11.5–15.5)
WBC: 6 10*3/uL (ref 4.0–10.5)

## 2020-11-13 LAB — COMPREHENSIVE METABOLIC PANEL
ALT: 16 U/L (ref 0–53)
AST: 15 U/L (ref 0–37)
Albumin: 4.1 g/dL (ref 3.5–5.2)
Alkaline Phosphatase: 66 U/L (ref 39–117)
BUN: 15 mg/dL (ref 6–23)
CO2: 31 mEq/L (ref 19–32)
Calcium: 9.3 mg/dL (ref 8.4–10.5)
Chloride: 103 mEq/L (ref 96–112)
Creatinine, Ser: 1.13 mg/dL (ref 0.40–1.50)
GFR: 73.89 mL/min (ref >60.00)
Glucose, Bld: 109 mg/dL — ABNORMAL HIGH (ref 70–99)
Potassium: 4.4 mEq/L (ref 3.5–5.1)
Sodium: 139 mEq/L (ref 135–145)
Total Bilirubin: 0.5 mg/dL (ref 0.2–1.2)
Total Protein: 6.5 g/dL (ref 6.0–8.3)

## 2020-11-13 LAB — LIPID PANEL
Cholesterol: 133 mg/dL (ref 0–200)
HDL: 47 mg/dL (ref >39.00)
LDL Cholesterol: 75 mg/dL (ref 0–99)
NonHDL: 85.86
Total CHOL/HDL Ratio: 3
Triglycerides: 56 mg/dL (ref 0.0–149.0)
VLDL: 11.2 mg/dL (ref 0.0–40.0)

## 2020-11-13 LAB — PSA: PSA: 0.26 ng/mL (ref 0.10–4.00)

## 2020-11-13 LAB — TSH: TSH: 1.88 u[IU]/mL (ref 0.35–4.50)

## 2020-11-13 NOTE — Assessment & Plan Note (Signed)
Per nephrology 

## 2020-11-13 NOTE — Assessment & Plan Note (Signed)
May be related to ringing in ears but also only occurs with turning of the head  epley manuver ho given to pt

## 2020-11-13 NOTE — Assessment & Plan Note (Signed)
Per ent

## 2020-11-13 NOTE — Assessment & Plan Note (Signed)
Check labs ghm utd See AVS 

## 2020-11-13 NOTE — Patient Instructions (Signed)
How to Perform the Epley Maneuver The Epley maneuver is an exercise that relieves symptoms of vertigo. Vertigo is the feeling that you or your surroundings are moving when they are not. When you feel vertigo, you may feel like the room is spinning and may have trouble walking. The Epley maneuver is used for a type of vertigo caused by a calcium deposit in a part of the inner ear. The maneuver involves changing head positions to help the deposit move out of the area. You can do this maneuver at home whenever you have symptoms of vertigo. You can repeat it in 24 hours if your vertigo has not gone away. Even though the Epley maneuver may relieve your vertigo for a few weeks, it is possible that your symptoms will return. This maneuver relieves vertigo, but it does not relieve dizziness. What are the risks? If it is done correctly, the Epley maneuver is considered safe. Sometimes it can lead to dizziness or nausea that goes away after a short time. If you develop other symptoms--such as changes in vision, weakness, or numbness--stop doing the maneuver and call your health care provider. Supplies needed:  A bed or table.  A pillow. How to do the Epley maneuver 1. Sit on the edge of a bed or table with your back straight and your legs extended or hanging over the edge of the bed or table. 2. Turn your head halfway toward the affected ear or side as told by your health care provider. 3. Lie backward quickly with your head turned until you are lying flat on your back. You may want to position a pillow under your shoulders. 4. Hold this position for at least 30 seconds. If you feel dizzy or have symptoms of vertigo, continue to hold the position until the symptoms stop. 5. Turn your head to the opposite direction until your unaffected ear is facing the floor. 6. Hold this position for at least 30 seconds. If you feel dizzy or have symptoms of vertigo, continue to hold the position until the symptoms  stop. 7. Turn your whole body to the same side as your head so that you are positioned on your side. Your head will now be nearly facedown. Hold for at least 30 seconds. If you feel dizzy or have symptoms of vertigo, continue to hold the position until the symptoms stop. 8. Sit back up. You can repeat the maneuver in 24 hours if your vertigo does not go away.      Follow these instructions at home: For 24 hours after doing the Epley maneuver:  Keep your head in an upright position.  When lying down to sleep or rest, keep your head raised (elevated) with two or more pillows.  Avoid excessive neck movements. Activity  Do not drive or use machinery if you feel dizzy.  After doing the Epley maneuver, return to your normal activities as told by your health care provider. Ask your health care provider what activities are safe for you. General instructions  Drink enough fluid to keep your urine pale yellow.  Do not drink alcohol.  Take over-the-counter and prescription medicines only as told by your health care provider.  Keep all follow-up visits as told by your health care provider. This is important. Preventing vertigo symptoms Ask your health care provider if there is anything you should do at home to prevent vertigo. He or she may recommend that you:  Keep your head elevated with two or more pillows while you sleep.    Do not sleep on the side of your affected ear.  Get up slowly from bed.  Avoid sudden movements during the day.  Avoid extreme head positions or movement, such as looking up or bending over. Contact a health care provider if:  Your vertigo gets worse.  You have other symptoms, including: ? Nausea. ? Vomiting. ? Headache. Get help right away if you:  Have vision changes.  Have a headache or neck pain that is severe or getting worse.  Cannot stop vomiting.  Have new numbness or weakness in any part of your body. Summary  Vertigo is the feeling that  you or your surroundings are moving when they are not.  The Epley maneuver is an exercise that relieves symptoms of vertigo.  If the Epley maneuver is done correctly, it is considered safe and relieves vertigo quickly. This information is not intended to replace advice given to you by your health care provider. Make sure you discuss any questions you have with your health care provider. Document Revised: 06/16/2019 Document Reviewed: 06/16/2019 Elsevier Patient Education  2021 [employer]. Preventive Care 10-41 Years Old, Male Preventive care refers to lifestyle choices and visits with your health care provider that can promote health and wellness. This includes:  A yearly physical exam. This is also called an annual wellness visit.  Regular dental and eye exams.  Immunizations.  Screening for certain conditions.  Healthy lifestyle choices, such as: ? Eating a healthy diet. ? Getting regular exercise. ? Not using drugs or products that contain nicotine and tobacco. ? Limiting alcohol use. What can I expect for my preventive care visit? Physical exam Your health care provider will check your:  Height and weight. These may be used to calculate your BMI (body mass index). BMI is a measurement that tells if you are at a healthy weight.  Heart rate and blood pressure.  Body temperature.  Skin for abnormal spots. Counseling Your health care provider may ask you questions about your:  Past medical problems.  Family's medical history.  Alcohol, tobacco, and drug use.  Emotional well-being.  Home life and relationship well-being.  Sexual activity.  Diet, exercise, and sleep habits.  Work and work Statistician.  Access to firearms. What immunizations do I need? Vaccines are usually given at various ages, according to a schedule. Your health care provider will recommend vaccines for you based on your age, medical history, and lifestyle or other factors, such as travel or  where you work.   What tests do I need? Blood tests  Lipid and cholesterol levels. These may be checked every 5 years, or more often if you are over 95 years old.  Hepatitis C test.  Hepatitis B test. Screening  Lung cancer screening. You may have this screening every year starting at age 60 if you have a 30-pack-year history of smoking and currently smoke or have quit within the past 15 years.  Prostate cancer screening. Recommendations will vary depending on your family history and other risks.  Genital exam to check for testicular cancer or hernias.  Colorectal cancer screening. ? All adults should have this screening starting at age 104 and continuing until age 63. ? Your health care provider may recommend screening at age 56 if you are at increased risk. ? You will have tests every 1-10 years, depending on your results and the type of screening test.  Diabetes screening. ? This is done by checking your blood sugar (glucose) after you have not eaten for a  while (fasting). ? You may have this done every 1-3 years.  STD (sexually transmitted disease) testing, if you are at risk. Follow these instructions at home: Eating and drinking  Eat a diet that includes fresh fruits and vegetables, whole grains, lean protein, and low-fat dairy products.  Take vitamin and mineral supplements as recommended by your health care provider.  Do not drink alcohol if your health care provider tells you not to drink.  If you drink alcohol: ? Limit how much you have to 0-2 drinks a day. ? Be aware of how much alcohol is in your drink. In the [place] one drink equals one 12 oz bottle of beer (355 mL), one 5 oz glass of wine (148 mL), or one 1 oz glass of hard liquor (44 mL).   Lifestyle  Take daily care of your teeth and gums. Brush your teeth every morning and night with fluoride toothpaste. Floss one time each day.  Stay active. Exercise for at least 30 minutes 5 or more days each week.  Do  not use any products that contain nicotine or tobacco, such as cigarettes, e-cigarettes, and chewing tobacco. If you need help quitting, ask your health care provider.  Do not use drugs.  If you are sexually active, practice safe sex. Use a condom or other form of protection to prevent STIs (sexually transmitted infections).  If told by your health care provider, take low-dose aspirin daily starting at age 3.  Find healthy ways to cope with stress, such as: ? Meditation, yoga, or listening to music. ? Journaling. ? Talking to a trusted person. ? Spending time with friends and family. Safety  Always wear your seat belt while driving or riding in a vehicle.  Do not drive: ? If you have been drinking alcohol. Do not ride with someone who has been drinking. ? When you are tired or distracted. ? While texting.  Wear a helmet and other protective equipment during sports activities.  If you have firearms in your house, make sure you follow all gun safety procedures. What's next?  Go to your health care provider once a year for an annual wellness visit.  Ask your health care provider how often you should have your eyes and teeth checked.  Stay up to date on all vaccines. This information is not intended to replace advice given to you by your health care provider. Make sure you discuss any questions you have with your health care provider. Document Revised: 05/18/2019 Document Reviewed: 08/13/2018 Elsevier Patient Education  2021 [employer].

## 2020-11-13 NOTE — Assessment & Plan Note (Signed)
Per endo °

## 2020-11-13 NOTE — Progress Notes (Signed)
Patient ID: Bobby Gonzalez, male    DOB: Jun 06, 1966  Age: 55 y.o. MRN: [id number]    Subjective:  Subjective  HPI Bobby Gonzalez presents for physical today. He had a syncope episode recently, it was his first time passing out. He describes that rotating his head it causes him to have dizziness. He is currently being managed by a Cardiologist.   He is requested for levothyroxine secondary to his PMHx of left tonsil squamous cell CA and hypothyroidism. He notes that he is feeling well today.  He denies of any SOB, N/V/D, HA, abdominal pain, chest pain, sore throat, fever and fatigue.    Review of Systems  Constitutional: Negative for chills, fatigue and fever.  HENT: Negative for congestion, rhinorrhea, sinus pain and sore throat.   Eyes: Negative for pain and discharge.  Respiratory: Negative for cough and shortness of breath.   Cardiovascular: Negative for chest pain, palpitations and leg swelling.  Gastrointestinal: Negative for abdominal pain, blood in stool, constipation, diarrhea, nausea and vomiting.  Genitourinary: Negative for dysuria, frequency, hematuria and urgency.  Musculoskeletal: Negative for back pain and myalgias.  Skin: Negative for rash.  Neurological: Positive for dizziness. Negative for headaches.    History Past Medical History:  Diagnosis Date  . Acquired hypothyroidism 09/27/2020  . Allergy    pcns  . 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 radiation and chemo 05-13-12(Dr. Lamonte Sakai)  . Chronic kidney disease    right renal mass 7.3x6.7x6.8cm renal cell ca  . Dehydration 05/13/2012  . Difficult intubation    POSSIBLE DIFFICULT INTUBATION--S/P RADIATION FOR CANCER LEFT TONSIL--EATING BUT THROAT STILL SORE AND PT HAS THICK MUCUS  . Diverticula of colon 03/10/12   multiple rectosigmoid colonic diverticula/ ct abd/pelvis  . Dry mouth 05/16/2014  . Essential hypertension 10/11/2019  . Gastro-esophageal  reflux disease without esophagitis   . GERD (gastroesophageal reflux disease)   . Hearing loss of right ear 05/29/2020  . History of cancer tonsil 09/20/2016  . History of head and neck radiation 09/20/2016  . History of radiation therapy 03/30/12-05/15/12   left tonsil  . HTN (hypertension) 06-25-12   at first start of chemo for squamous cell cancer of neck-left  . Hypothyroid 02/15/2013  . Inguinal hernia 10/23/2012  . Malignant tumor of kidney ([place]) 09/27/2020  . 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[place]) 09/27/2020  . Multiple lung nodules on CT 05/16/2014  . Pain    JOINT PAINS UPPER BODY AFTER RADIATION TREATMENTS  . Personal history of malignant neoplasm of unspecified site of lip, oral cavity, and pharynx 09/27/2020  . Personal history of other malignant neoplasm of kidney 09/27/2020  . Phlegm in throat 05/13/2012  . Preventive measure 05/16/2014  . Renal cell carcinoma ([place]) 09/09/2012  . Renal mass 03/30/2012  . Sensorineural hearing loss, bilateral 09/20/2016  . Solitary pulmonary nodule 09/27/2020  . Tinnitus aurium, right 05/29/2020  . Tonsil cancer ([place]) 02/2012   SCCa of Left tonsil-S/P biopsy on 02/17/12.    He has a past surgical history that includes Wisdom tooth extraction; Direct laryngoscopy; Gastrostomy tube placement (03/02/12); Multiple tooth extractions (03/04/12); PEG tube placement (06-25-12); tonsil biopsy (02/17/12); Laparoscopic nephrectomy (06/29/2012); PEG tube removal (10/2012); Inguinal hernia repair (Left, 11/04/2012); and Insertion of mesh (Left, 11/04/2012).   His family history includes Cancer in his father; Hypertension in his brother and father; Stroke in his father; Throat cancer in his brother; Thyroid disease in his brother.He  reports that he has never smoked. He has never used smokeless tobacco. He reports that he does not drink alcohol and does not use drugs.  Current Outpatient Medications on File Prior to Visit  Medication Sig Dispense Refill  . cetirizine (ZYRTEC) 10 MG tablet  Take 10 mg by mouth daily.    Marland Kitchen levothyroxine (SYNTHROID) 88 MCG tablet TAKE 1 TABLET BY MOUTH DAILY 30 tablet 2  . Multiple Vitamin (MULTIVITAMIN) tablet Take 1 tablet by mouth daily.     No current facility-administered medications on file prior to visit.     Objective:  Objective  Physical Exam Constitutional:      General: He is not in acute distress.    Appearance: Normal appearance. He is well-developed. He is not ill-appearing or diaphoretic.  HENT:     Head: Normocephalic and atraumatic.     Right Ear: Tympanic membrane, ear canal and external ear normal.     Left Ear: Tympanic membrane, ear canal and external ear normal.     Nose: Nose normal.  Eyes:     General:        Right eye: No discharge.        Left eye: No discharge.     Extraocular Movements: Extraocular movements intact.     Conjunctiva/sclera: Conjunctivae normal.     Pupils: Pupils are equal, round, and reactive to light.     Comments: Single Eye Exam 20/20 - 3  Both Eye Exam 20/20 - 1   Neck:     Thyroid: No thyromegaly.     Vascular: No JVD.  Cardiovascular:     Rate and Rhythm: Normal rate and regular rhythm.     Pulses: Normal pulses.     Heart sounds: Normal heart sounds. No murmur heard.   Pulmonary:     Effort: Pulmonary effort is normal. No respiratory distress.     Breath sounds: Normal breath sounds. No wheezing or rales.  Chest:     Chest wall: No tenderness.  Abdominal:     General: Bowel sounds are normal. There is no distension.     Palpations: Abdomen is soft. There is no mass.     Tenderness: There is no abdominal tenderness. There is no guarding or rebound.  Musculoskeletal:        General: Normal range of motion.  Skin:    General: Skin is warm and dry.     Findings: No erythema or rash.  Neurological:     Mental Status: He is alert and oriented to person, place, and time.     Cranial Nerves: No cranial nerve deficit.     Motor: No abnormal muscle tone.     Deep Tendon  Reflexes: Reflexes are normal and symmetric. Reflexes normal.  Psychiatric:        Behavior: Behavior normal.    BP 110/80 (BP Location: Right Arm, Patient Position: Sitting, Cuff Size: Normal)   Pulse (!) 58   Temp 98.1 F (36.7 C) (Oral)   Resp 16   Ht 5\' 10"  (1.778 m)   Wt 185 lb 6.4 oz (84.1 kg)   SpO2 99%   BMI 26.60 kg/m  Wt Readings from Last 3 Encounters:  11/13/20 185 lb 6.4 oz (84.1 kg)  09/29/20 183 lb (83 kg)  09/20/20 179 lb (81.2 kg)     Lab Results  Component Value Date   WBC 5.7 09/20/2020   HGB 16.6 09/20/2020   HCT 48.7 09/20/2020   PLT 229.0 09/20/2020  GLUCOSE 101 (H) 09/20/2020   CHOL 130 05/15/2020   TRIG 67 05/15/2020   HDL 55 05/15/2020   LDLCALC 61 05/15/2020   ALT 23 09/20/2020   AST 18 09/20/2020   NA 137 09/20/2020   K 4.3 09/20/2020   CL 101 09/20/2020   CREATININE 1.32 09/20/2020   BUN 16 09/20/2020   CO2 32 09/20/2020   TSH 2.84 09/20/2020   PSA 0.27 10/11/2019   INR 0.94 03/02/2012    ECHOCARDIOGRAM COMPLETE  Result Date: 10/30/2020    ECHOCARDIOGRAM REPORT   Patient Name:   Bobby Gonzalez Date of Exam: 10/30/2020 Medical Rec #:  [id number]       Height:       70.0 in Accession #:    [id number]      Weight:       183.0 lb Date of Birth:  1966/07/06       BSA:          2.010 m Patient Age:    61 years        BP:           128/88 mmHg Patient Gender: M               HR:           60 bpm. Exam Location:  [facility] Procedure: 2D Echo, 3D Echo, Cardiac Doppler and Color Doppler Indications:    R01.1 Murmur; R55 Syncope  History:        Patient has no prior history of Echocardiogram examinations.                 Signs/Symptoms:Shortness of Breath and Murmur; Risk                 Factors:Hypertension.  Sonographer:    Geradine Girt Referring Phys: [facility] IMPRESSIONS  1. Left ventricular ejection fraction, by estimation, is 60 to 65%. The left ventricle has normal function. The left ventricle has no regional wall motion  abnormalities. There is moderate concentric left ventricular hypertrophy. Left ventricular diastolic parameters are consistent with Grade II diastolic dysfunction (pseudonormalization). Elevated left atrial pressure.  2. Right ventricular systolic function is normal. The right ventricular size is normal. There is normal pulmonary artery systolic pressure.  3. The mitral valve is normal in structure. No evidence of mitral valve regurgitation. No evidence of mitral stenosis.  4. The aortic valve is tricuspid. Aortic valve regurgitation is not visualized. No aortic stenosis is present.  5. The inferior vena cava is normal in size with greater than 50% respiratory variability, suggesting right atrial pressure of 3 mmHg. FINDINGS  Left Ventricle: Left ventricular ejection fraction, by estimation, is 60 to 65%. The left ventricle has normal function. The left ventricle has no regional wall motion abnormalities. The left ventricular internal cavity size was normal in size. There is  moderate concentric left ventricular hypertrophy. Left ventricular diastolic parameters are consistent with Grade II diastolic dysfunction (pseudonormalization). Elevated left atrial pressure. Right Ventricle: The right ventricular size is normal. No increase in right ventricular wall thickness. Right ventricular systolic function is normal. There is normal pulmonary artery systolic pressure. The tricuspid regurgitant velocity is 2.25 m/s, and  with an assumed right atrial pressure of 3 mmHg, the estimated right ventricular systolic pressure is 76.1 mmHg. Left Atrium: Left atrial size was normal in size. Right Atrium: Right atrial size was normal in size. Pericardium: There is no evidence of pericardial effusion. Mitral Valve: The mitral  valve is normal in structure. No evidence of mitral valve regurgitation. No evidence of mitral valve stenosis. Tricuspid Valve: The tricuspid valve is normal in structure. Tricuspid valve regurgitation is mild  . No evidence of tricuspid stenosis. Aortic Valve: The aortic valve is tricuspid. Aortic valve regurgitation is not visualized. No aortic stenosis is present. Pulmonic Valve: The pulmonic valve was normal in structure. Pulmonic valve regurgitation is trivial. No evidence of pulmonic stenosis. Aorta: The aortic root, ascending aorta, aortic arch and descending aorta are all structurally normal, with no evidence of dilitation or obstruction. Venous: A normal flow pattern is recorded from the right upper pulmonary vein. The inferior vena cava is normal in size with greater than 50% respiratory variability, suggesting right atrial pressure of 3 mmHg. IAS/Shunts: No atrial level shunt detected by color flow Doppler.  LEFT VENTRICLE PLAX 2D LVIDd:         4.27 cm  Diastology LVIDs:         2.35 cm  LV e' medial:    8.05 cm/s LV PW:         1.44 cm  LV E/e' medial:  10.7 LV IVS:        1.52 cm  LV e' lateral:   12.70 cm/s LVOT diam:     2.00 cm  LV E/e' lateral: 6.8 LV SV:         68 LV SV Index:   34 LVOT Area:     3.14 cm                          3D Volume EF:                         3D EF:        56 %                         LV EDV:       174 ml                         LV ESV:       77 ml                         LV SV:        97 ml RIGHT VENTRICLE RV S prime:     11.30 cm/s TAPSE (M-mode): 2.4 cm LEFT ATRIUM           Index       RIGHT ATRIUM          Index LA diam:      3.70 cm 1.84 cm/m  RA Area:     8.15 cm LA Vol (A2C): 56.8 ml 28.26 ml/m RA Volume:   18.00 ml 8.96 ml/m LA Vol (A4C): 25.2 ml 12.54 ml/m  AORTIC VALVE LVOT Vmax:   105.00 cm/s LVOT Vmean:  64.700 cm/s LVOT VTI:    0.217 m  AORTA Ao Root diam: 2.80 cm Ao Asc diam:  3.00 cm MITRAL VALVE               TRICUSPID VALVE MV Area (PHT): 3.99 cm    TR Peak grad:   20.2 mmHg MV Decel Time: 190 msec    TR Vmax:        225.00 cm/s MV E velocity: 86.50 cm/s MV  A velocity: 51.00 cm/s  SHUNTS MV E/A ratio:  1.70        Systemic VTI:  0.22 m                             Systemic Diam: 2.00 cm Shirlee More MD Electronically signed by Shirlee More MD Signature Date/Time: 10/30/2020/12:44:29 PM    Final      Assessment & Plan:  Plan    No orders of the defined types were placed in this encounter.   Problem List Items Addressed This Visit      Unprioritized   Chronic kidney disease    Per nephrology       Dizziness    May be related to ringing in ears but also only occurs with turning of the head  epley manuver ho given to pt        Essential hypertension    Well controlled, no changes to meds. Encouraged heart healthy diet such as the DASH diet and exercise as tolerated.       HTN (hypertension)    Well controlled, no changes to meds. Encouraged heart healthy diet such as the DASH diet and exercise as tolerated.       Hypothyroid    Per endo       Relevant Orders   TSH   Preventative health care - Primary    Check labs  ghm utd See AVS      Relevant Orders   TSH   Lipid panel   CBC with Differential/Platelet   Comprehensive metabolic panel   PSA   Sensorineural hearing loss, bilateral    Per ent      Syncope    Check US carotids  F/u cardiology      Relevant Orders   US Carotid Bilateral   Tonsil cancer ([place])    Other Visit Diagnoses    Need for hepatitis C screening test       Relevant Orders   Hepatitis C antibody   History of syncope        Prostate Exam- Last completed on 10/11/2019, normal  Colonoscopy- Last completed 04/27/2018, two pylops were noted repeat in 5 years to monitor if precancerous; but other wise 10 years.     Follow-up: Return in about 6 months (around 05/16/2021), or if symptoms worsen or fail to improve, for thyroid.   I,Alexis Bryant,acting as a Education administrator for [employer], DO.,have documented all relevant documentation on the behalf of Ann Held, DO,as directed by  Ann Held, DO while in the presence of [place], DO, have reviewed all documentation for this visit. The documentation on 11/13/20 for the exam, diagnosis, procedures, and orders are all accurate and complete.

## 2020-11-13 NOTE — Assessment & Plan Note (Signed)
Check US carotids  F/u cardiology

## 2020-11-13 NOTE — Assessment & Plan Note (Signed)
Well controlled, no changes to meds. Encouraged heart healthy diet such as the DASH diet and exercise as tolerated.  °

## 2020-11-14 LAB — HEPATITIS C ANTIBODY
Hepatitis C Ab: NONREACTIVE
SIGNAL TO CUT-OFF: 0.01 (ref <1.00)

## 2020-11-20 ENCOUNTER — Other Ambulatory Visit: Payer: Self-pay | Admitting: Family Medicine

## 2020-11-20 ENCOUNTER — Ambulatory Visit (HOSPITAL_BASED_OUTPATIENT_CLINIC_OR_DEPARTMENT_OTHER)
Admission: RE | Admit: 2020-11-20 | Discharge: 2020-11-20 | Disposition: A | Discharge status: Home / Self Care | Payer: No Typology Code available for payment source | Source: Ambulatory Visit | Attending: Family Medicine | Admitting: Family Medicine

## 2020-11-20 ENCOUNTER — Other Ambulatory Visit: Payer: Self-pay

## 2020-11-20 DIAGNOSIS — R55 Syncope and collapse: Secondary | ICD-10-CM | POA: Insufficient documentation

## 2020-11-20 DIAGNOSIS — R9389 Abnormal findings on diagnostic imaging of other specified body structures: Secondary | ICD-10-CM

## 2020-11-21 ENCOUNTER — Other Ambulatory Visit: Payer: Self-pay

## 2020-11-27 ENCOUNTER — Telehealth: Payer: Self-pay | Admitting: Family Medicine

## 2020-11-27 ENCOUNTER — Other Ambulatory Visit: Payer: Self-pay | Admitting: Family Medicine

## 2020-11-27 DIAGNOSIS — R42 Dizziness and giddiness: Secondary | ICD-10-CM

## 2020-11-27 NOTE — Telephone Encounter (Signed)
Patient states he had a US carotid a week or two ago. He was told he needed further testing. Patient is checking the status

## 2020-11-27 NOTE — Telephone Encounter (Signed)
Called patient LDVM

## 2020-11-27 NOTE — Telephone Encounter (Signed)
Order was not entered---- I put it in

## 2020-11-30 ENCOUNTER — Encounter (HOSPITAL_BASED_OUTPATIENT_CLINIC_OR_DEPARTMENT_OTHER): Payer: Self-pay

## 2020-11-30 ENCOUNTER — Other Ambulatory Visit: Payer: Self-pay

## 2020-11-30 ENCOUNTER — Ambulatory Visit (HOSPITAL_BASED_OUTPATIENT_CLINIC_OR_DEPARTMENT_OTHER)
Admission: RE | Admit: 2020-11-30 | Discharge: 2020-11-30 | Disposition: A | Discharge status: Home / Self Care | Payer: No Typology Code available for payment source | Source: Ambulatory Visit | Attending: Family Medicine | Admitting: Family Medicine

## 2020-11-30 DIAGNOSIS — R42 Dizziness and giddiness: Secondary | ICD-10-CM | POA: Diagnosis not present

## 2020-11-30 MED ORDER — IOHEXOL 350 MG/ML SOLN
100.0000 mL | Freq: Once | INTRAVENOUS | Status: AC | PRN
  Administered 2020-11-30: 100 mL via INTRAVENOUS

## 2020-12-01 ENCOUNTER — Other Ambulatory Visit: Payer: Self-pay | Admitting: Family Medicine

## 2020-12-01 DIAGNOSIS — I6522 Occlusion and stenosis of left carotid artery: Secondary | ICD-10-CM

## 2020-12-11 ENCOUNTER — Other Ambulatory Visit: Payer: Self-pay

## 2020-12-11 ENCOUNTER — Encounter: Payer: Self-pay | Admitting: Vascular Surgery

## 2020-12-11 ENCOUNTER — Ambulatory Visit (INDEPENDENT_AMBULATORY_CARE_PROVIDER_SITE_OTHER): Payer: No Typology Code available for payment source | Admitting: Vascular Surgery

## 2020-12-11 VITALS — BP 132/80 | HR 52 | Temp 98.4°F | Resp 20 | Ht 70.0 in | Wt 182.0 lb

## 2020-12-11 DIAGNOSIS — I6522 Occlusion and stenosis of left carotid artery: Secondary | ICD-10-CM | POA: Diagnosis not present

## 2020-12-11 NOTE — Progress Notes (Signed)
Patient ID: Bobby Gonzalez, male   DOB: 03-10-1966, 55 y.o.   MRN: [id number]  Reason for Consult: New Patient (Initial Visit)   Referred by Carollee Herter, Alferd Apa, *  Subjective:     HPI:  Bobby Gonzalez is a 55 y.o. 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  Patient has never been a smoker.  Does have a history of hypertension.  He states that he has recently noticed that when he turns his head to the side he feels drunk but this does resolve quickly.  This has happened a couple times while driving he has been able to complete his tasks.  He denies any history of stroke, TIA or amaurosis.  Denies history of seizure.  He denies any history of coronary artery disease.  He has no personal or family history of aneurysm disease.  Patient does take baby aspirin daily.  Past Medical History:  Diagnosis Date  . Acquired hypothyroidism 09/27/2020  . Allergy    pcns  . Cancer ([place]) 02/17/12 bx   left tonsil squamous cell ca,HPV positive, ,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 radiation and chemo 05-13-12(Dr. Lamonte Sakai)  . Chronic kidney disease    right renal mass 7.3x6.7x6.8cm renal cell ca  . Dehydration 05/13/2012  . Difficult intubation    POSSIBLE DIFFICULT INTUBATION--S/P RADIATION FOR CANCER LEFT TONSIL--EATING BUT THROAT STILL SORE AND PT HAS THICK MUCUS  . Diverticula of colon 03/10/12   multiple rectosigmoid colonic diverticula/ ct abd/pelvis  . Dry mouth 05/16/2014  . Essential hypertension 10/11/2019  . Gastro-esophageal reflux disease without esophagitis   . GERD (gastroesophageal reflux disease)   . Hearing loss of right ear 05/29/2020  . History of cancer tonsil 09/20/2016  . History of head and neck radiation 09/20/2016  . History of radiation therapy 03/30/12-05/15/12   left tonsil  . HTN (hypertension) 06-25-12   at first start of chemo for squamous cell cancer of neck-left  . Hypothyroid 02/15/2013  . Inguinal hernia 10/23/2012  . Malignant tumor  of kidney ([place]) 09/27/2020  . 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[place]) 09/27/2020  . Multiple lung nodules on CT 05/16/2014  . Pain    JOINT PAINS UPPER BODY AFTER RADIATION TREATMENTS  . Personal history of malignant neoplasm of unspecified site of lip, oral cavity, and pharynx 09/27/2020  . Personal history of other malignant neoplasm of kidney 09/27/2020  . Phlegm in throat 05/13/2012  . Preventive measure 05/16/2014  . Renal cell carcinoma ([place]) 09/09/2012  . Renal mass 03/30/2012  . Sensorineural hearing loss, bilateral 09/20/2016  . Solitary pulmonary nodule 09/27/2020  . Tinnitus aurium, right 05/29/2020  . Tonsil cancer ([place]) 02/2012   SCCa of Left tonsil-S/P biopsy on 02/17/12.   Family History  Problem Relation Age of Onset  . Stroke Father   . Cancer Father        Prostate  . Hypertension Father   . Thyroid disease Brother   . Hypertension Brother   . Throat cancer Brother   . Colon cancer Neg Hx   . Esophageal cancer Neg Hx   . Rectal cancer Neg Hx   . Stomach cancer Neg Hx    Past Surgical History:  Procedure Laterality Date  . DIRECT LARYNGOSCOPY     S/P DL, Biopsy-Dr. Constance Holster. Pathology postive for SCCa of Left Tonsil  . GASTROSTOMY TUBE PLACEMENT  03/02/12  . INGUINAL HERNIA REPAIR Left 11/04/2012   Procedure: HERNIA REPAIR INGUINAL ADULT;  Surgeon: Marcello Moores  Nydia Bouton, MD;  [facility];  Service: General;  Laterality: Left;  . INSERTION OF MESH Left 11/04/2012   Procedure: INSERTION OF MESH;  Surgeon: Joyice Faster. Cornett, MD;  [facility];  Service: General;  Laterality: Left;  . LAPAROSCOPIC NEPHRECTOMY  06/29/2012   Procedure: LAPAROSCOPIC NEPHRECTOMY;  Surgeon: Dutch Gray, MD;  [facility];  Service: Urology;  Laterality: Right;  . MULTIPLE TOOTH EXTRACTIONS  03/04/12   with DR. Mohorn  . PEG TUBE PLACEMENT  06-25-12   remains in place abdomen-not using at present.  . PEG TUBE REMOVAL  10/2012  . tonsil biopsy  02/17/12   SCCa left tomnsil,HPV positive,spread to l cervical  node  . WISDOM TOOTH EXTRACTION      Short Social History:  Social History   Tobacco Use  . Smoking status: Never Smoker  . Smokeless tobacco: Never Used  Substance Use Topics  . Alcohol use: No    Comment: Never drank alocohol.    Allergies  Allergen Reactions  . Banana Other (See Comments)    Unknown  Other reaction(s): Unknown  . Penicillins     unknown Other reaction(s): Unknown    Current Outpatient Medications  Medication Sig Dispense Refill  . cetirizine (ZYRTEC) 10 MG tablet Take 10 mg by mouth daily.    Marland Kitchen levothyroxine (SYNTHROID) 88 MCG tablet TAKE 1 TABLET BY MOUTH DAILY 30 tablet 2  . Multiple Vitamin (MULTIVITAMIN) tablet Take 1 tablet by mouth daily.    . pilocarpine (SALAGEN) 5 MG tablet TAKE 1 TABLET BY MOUTH 3 TIMES DAILY 90 tablet 5   No current facility-administered medications for this visit.    Review of Systems  Constitutional:  Constitutional negative. HENT: HENT negative.  Eyes: Eyes negative.  Respiratory: Respiratory negative.  Cardiovascular: Cardiovascular negative.  GI: Gastrointestinal negative.  Musculoskeletal: Musculoskeletal negative.  Skin: Skin negative.  Neurological: Positive for dizziness.  Hematologic: Hematologic/lymphatic negative.  Psychiatric: Psychiatric negative.        Objective:  Objective   Vitals:   12/11/20 0857 12/11/20 0859  BP: 130/83 132/80  Pulse: (!) 52   Resp: 20   Temp: 98.4 F (36.9 C)   SpO2: 98%   Weight: 182 lb (82.6 kg)   Height: 5\' 10"  (1.778 m)    Body mass index is 26.11 kg/m.  Physical Exam HENT:     Head: Normocephalic.     Nose:     Comments: Wearing a mask Eyes:     Pupils: Pupils are equal, round, and reactive to light.  Neck:     Vascular: No carotid bruit.  Cardiovascular:     Rate and Rhythm: Normal rate.     Pulses: Normal pulses.  Pulmonary:     Effort: Pulmonary effort is normal.  Abdominal:     General: Abdomen is flat.     Palpations: Abdomen is soft.   Musculoskeletal:        General: Normal range of motion.  Skin:    General: Skin is warm.     Capillary Refill: Capillary refill takes less than 2 seconds.  Neurological:     General: No focal deficit present.     Mental Status: He is alert.  Psychiatric:        Mood and Affect: Mood normal.        Behavior: Behavior normal.        Thought Content: Thought content normal.        Judgment: Judgment normal.  Data: CT ANGIOGRAPHY NECK  TECHNIQUE: Multidetector CT imaging of the neck was performed using the standard protocol during bolus administration of intravenous contrast. Multiplanar CT image reconstructions and MIPs were obtained to evaluate the vascular anatomy. Carotid stenosis measurements (when applicable) are obtained utilizing NASCET criteria, using the distal internal carotid diameter as the denominator.  CONTRAST:  149mL OMNIPAQUE IOHEXOL 350 MG/ML SOLN  COMPARISON:  Ten days ago  FINDINGS: Aortic arch: Normal.  Three vessel branching.  Right carotid system: Vessels are smooth and widely patent.  Left carotid system: Low-density plaque at the proximal ICA which is below the jawline and beyond the bulb. Stenosis measures 60% based on reformats. No ulceration or beading.  Vertebral arteries: No proximal subclavian stenosis. The vertebral arteries are smooth and widely patent.  Skeleton: Negative  Other neck: Post treatment neck.  No worrisome finding  Upper chest: Negative  IMPRESSION: 1. ~60% stenosis in the proximal left ICA. 2. Otherwise negative neck CTA.   Carotid duplex IMPRESSION: Heterogeneous plaque at the bilateral carotid bifurcation, with discordant results regarding degree of stenosis by established duplex criteria. Peak velocity suggests 50%-69% stenosis, with the ICA/ CCA ratio suggesting a lesser degree of stenosis. If establishing a more accurate degree of stenosis is required, cerebral angiogram should be  considered, or as a second best test, CTA.      Assessment/Plan:     55 year old male with approximately 60% stenosis on his left ICA by NASCET criteria.  This is most certainly secondary to radiation given the appearance.  He is having positional dizziness which may be related but given the right carotid and bilateral vertebral arteries are patent certainly this would be more likely a diagnosis of exclusion.  We discussed referral to [facility] neurology for further evaluation and I will see him back afterwards to discuss plans moving forward.  Patient will continue baby aspirin daily.  I discussed with him the signs and symptoms of stroke for which to seek emergent medical attention and he demonstrates good understanding.  We discussed that if no other neurologic diagnoses are found he will either need carotid endarterectomy or stenting given his history of radiation and he demonstrates good understanding.     Waynetta Sandy MD [facility]

## 2020-12-14 ENCOUNTER — Other Ambulatory Visit (HOSPITAL_COMMUNITY): Payer: Self-pay

## 2020-12-14 ENCOUNTER — Encounter: Payer: Self-pay | Admitting: Family Medicine

## 2020-12-14 MED FILL — Levothyroxine Sodium Tab 88 MCG: ORAL | 30 days supply | Qty: 30 | Fill #0 | Status: AC

## 2020-12-16 ENCOUNTER — Ambulatory Visit (HOSPITAL_BASED_OUTPATIENT_CLINIC_OR_DEPARTMENT_OTHER): Payer: No Typology Code available for payment source

## 2020-12-18 NOTE — Telephone Encounter (Signed)
You do have to be careful but your kidney function is great and we check it everytime we do blood work

## 2020-12-21 ENCOUNTER — Ambulatory Visit: Payer: No Typology Code available for payment source | Admitting: Neurology

## 2020-12-26 DIAGNOSIS — K219 Gastro-esophageal reflux disease without esophagitis: Secondary | ICD-10-CM | POA: Insufficient documentation

## 2021-01-01 ENCOUNTER — Ambulatory Visit: Payer: No Typology Code available for payment source | Admitting: Cardiology

## 2021-01-12 ENCOUNTER — Other Ambulatory Visit (HOSPITAL_COMMUNITY): Payer: Self-pay

## 2021-01-12 ENCOUNTER — Other Ambulatory Visit: Payer: Self-pay | Admitting: Family Medicine

## 2021-01-13 ENCOUNTER — Other Ambulatory Visit (HOSPITAL_COMMUNITY): Payer: Self-pay

## 2021-01-13 MED FILL — Levothyroxine Sodium Tab 88 MCG: ORAL | 30 days supply | Qty: 30 | Fill #1 | Status: AC

## 2021-01-15 ENCOUNTER — Other Ambulatory Visit (HOSPITAL_COMMUNITY): Payer: Self-pay

## 2021-01-15 ENCOUNTER — Other Ambulatory Visit: Payer: Self-pay

## 2021-01-15 MED ORDER — LEVOTHYROXINE SODIUM 88 MCG PO TABS
ORAL_TABLET | Freq: Every day | ORAL | 2 refills | Status: DC
  Filled 2021-01-15 – 2021-02-05 (×2): qty 30, 30d supply, fill #0
  Filled 2021-03-13: qty 30, 30d supply, fill #1
  Filled 2021-04-06: qty 30, 30d supply, fill #2

## 2021-01-15 MED ORDER — PILOCARPINE HCL 5 MG PO TABS
5.0000 mg | ORAL_TABLET | Freq: Three times a day (TID) | ORAL | 2 refills | Status: DC
  Filled 2021-01-15: qty 90, 30d supply, fill #0

## 2021-01-19 ENCOUNTER — Encounter: Payer: Self-pay | Admitting: Vascular Surgery

## 2021-01-19 ENCOUNTER — Other Ambulatory Visit: Payer: Self-pay

## 2021-01-19 ENCOUNTER — Ambulatory Visit (INDEPENDENT_AMBULATORY_CARE_PROVIDER_SITE_OTHER): Payer: No Typology Code available for payment source | Admitting: Vascular Surgery

## 2021-01-19 VITALS — BP 105/62 | HR 62 | Temp 98.7°F | Resp 20 | Ht 70.0 in | Wt 171.0 lb

## 2021-01-19 DIAGNOSIS — I6522 Occlusion and stenosis of left carotid artery: Secondary | ICD-10-CM | POA: Diagnosis not present

## 2021-01-19 NOTE — Progress Notes (Signed)
Patient ID: Bobby Gonzalez, male   DOB: 05-28-1966, 55 y.o.   MRN: [id number]  Reason for Consult: Follow-up and Carotid   Referred by Carollee Herter, Alferd Apa, *  Subjective:     HPI:  Bobby Gonzalez is a 55 y.o. male who I recently saw for carotid artery stenosis.  Patient has history of left neck radiation as well as hypertension.  He states that when he moves his head to the side he feels drunk and dizzy.  This has not happened any further times since last I saw him.  He does not have a history of stroke, TIA or amaurosis.  He is a lifelong smoker.  He has not seen neurology since our last visit.  Past Medical History:  Diagnosis Date  . Acquired hypothyroidism 09/27/2020  . Allergy    pcns  . Cancer ([place]) 02/17/12 bx   left tonsil squamous cell ca,HPV positive, ,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 radiation and chemo 05-13-12(Dr. Lamonte Sakai)  . Cardiac murmur 09/29/2020  . Chronic kidney disease    right renal mass 7.3x6.7x6.8cm renal cell ca  . Dehydration 05/13/2012  . Difficult intubation    POSSIBLE DIFFICULT INTUBATION--S/P RADIATION FOR CANCER LEFT TONSIL--EATING BUT THROAT STILL SORE AND PT HAS THICK MUCUS  . Diverticula of colon 03/10/12   multiple rectosigmoid colonic diverticula/ ct abd/pelvis  . Dizziness 11/13/2020  . Dry mouth 05/16/2014  . Essential hypertension 10/11/2019  . Gastro-esophageal reflux disease without esophagitis   . GERD (gastroesophageal reflux disease)   . Hearing loss of right ear 05/29/2020  . History of cancer tonsil 09/20/2016  . History of head and neck radiation 09/20/2016  . History of radiation therapy 03/30/12-05/15/12   left tonsil  . HTN (hypertension) 06-25-12   at first start of chemo for squamous cell cancer of neck-left  . Hypothyroid 02/15/2013  . Inguinal hernia 10/23/2012  . Malignant tumor of kidney ([place]) 09/27/2020  . 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[place]) 09/27/2020  . Multiple lung nodules on CT 05/16/2014  . Pain    JOINT PAINS UPPER BODY AFTER  RADIATION TREATMENTS  . Personal history of malignant neoplasm of unspecified site of lip, oral cavity, and pharynx 09/27/2020  . Personal history of other malignant neoplasm of kidney 09/27/2020  . Phlegm in throat 05/13/2012  . Preventative health care 05/16/2014  . Preventive measure 05/16/2014  . Renal cell carcinoma ([place]) 09/09/2012  . Renal mass 03/30/2012  . Sensorineural hearing loss, bilateral 09/20/2016  . Solitary kidney, acquired 09/29/2020  . Solitary pulmonary nodule 09/27/2020  . Syncope 09/29/2020  . Tinnitus aurium, right 05/29/2020  . Tonsil cancer ([place]) 02/2012   SCCa of Left tonsil-S/P biopsy on 02/17/12.   Family History  Problem Relation Age of Onset  . Stroke Father   . Cancer Father        Prostate  . Hypertension Father   . Thyroid disease Brother   . Hypertension Brother   . Throat cancer Brother   . Colon cancer Neg Hx   . Esophageal cancer Neg Hx   . Rectal cancer Neg Hx   . Stomach cancer Neg Hx    Past Surgical History:  Procedure Laterality Date  . DIRECT LARYNGOSCOPY     S/P DL, Biopsy-Dr. Constance Holster. Pathology postive for SCCa of Left Tonsil  . GASTROSTOMY TUBE PLACEMENT  03/02/12  . INGUINAL HERNIA REPAIR Left 11/04/2012   Procedure: HERNIA REPAIR INGUINAL ADULT;  Surgeon: Joyice Faster. Cornett, MD;  [facility];  Service: General;  Laterality: Left;  . INSERTION OF MESH Left 11/04/2012   Procedure: INSERTION OF MESH;  Surgeon: Joyice Faster. Cornett, MD;  [facility];  Service: General;  Laterality: Left;  . LAPAROSCOPIC NEPHRECTOMY  06/29/2012   Procedure: LAPAROSCOPIC NEPHRECTOMY;  Surgeon: Dutch Gray, MD;  [facility];  Service: Urology;  Laterality: Right;  . MULTIPLE TOOTH EXTRACTIONS  03/04/12   with DR. Mohorn  . PEG TUBE PLACEMENT  06-25-12   remains in place abdomen-not using at present.  . PEG TUBE REMOVAL  10/2012  . tonsil biopsy  02/17/12   SCCa left tomnsil,HPV positive,spread to l cervical node  . WISDOM TOOTH EXTRACTION      Short Social  History:  Social History   Tobacco Use  . Smoking status: Never Smoker  . Smokeless tobacco: Never Used  Substance Use Topics  . Alcohol use: No    Comment: Never drank alocohol.    Allergies  Allergen Reactions  . Banana Other (See Comments)    Unknown  Other reaction(s): Unknown  . Penicillins     unknown Other reaction(s): Unknown    Current Outpatient Medications  Medication Sig Dispense Refill  . cetirizine (ZYRTEC) 10 MG tablet Take 10 mg by mouth daily.    Marland Kitchen levothyroxine (SYNTHROID) 88 MCG tablet Take 88 mcg by mouth daily before breakfast.    . levothyroxine (SYNTHROID) 88 MCG tablet TAKE 1 TABLET BY MOUTH DAILY 30 tablet 2  . Multiple Vitamin (MULTIVITAMIN) tablet Take 1 tablet by mouth daily.    . pilocarpine (SALAGEN) 5 MG tablet Take 1 tablet (5 mg total) by mouth 3 (three) times daily. 90 tablet 2   No current facility-administered medications for this visit.    Review of Systems  Constitutional:  Constitutional negative. HENT: HENT negative.  Eyes: Eyes negative.  Respiratory: Respiratory negative.  Cardiovascular: Cardiovascular negative.  GI: Gastrointestinal negative.  Musculoskeletal: Musculoskeletal negative.  Skin: Skin negative.  Neurological: Positive for dizziness.  Hematologic: Hematologic/lymphatic negative.  Psychiatric: Psychiatric negative.        Objective:  Objective   Vitals:   01/19/21 0821 01/19/21 0822  BP: 106/68 105/62  Pulse: 62   Resp: 20   Temp: 98.7 F (37.1 C)   SpO2: 98%   Weight: 171 lb (77.6 kg)   Height: 5\' 10"  (1.778 m)    Body mass index is 24.54 kg/m.  Physical Exam HENT:     Head: Normocephalic.     Nose:     Comments: Wearing a mask Eyes:     Pupils: Pupils are equal, round, and reactive to light.  Neck:     Vascular: No carotid bruit.  Cardiovascular:     Rate and Rhythm: Normal rate and regular rhythm.     Pulses: Normal pulses.     Heart sounds: Normal heart sounds.  Pulmonary:      Effort: Pulmonary effort is normal.     Breath sounds: Normal breath sounds.  Abdominal:     General: Abdomen is flat.     Palpations: Abdomen is soft.  Skin:    General: Skin is warm.     Capillary Refill: Capillary refill takes less than 2 seconds.  Neurological:     Mental Status: He is alert.  Psychiatric:        Mood and Affect: Mood normal.        Behavior: Behavior normal.        Thought Content: Thought content normal.  Judgment: Judgment normal.     Data: No new studies.  We reviewed his previous CT angio with his patient and his wife.     Assessment/Plan:     55 year old male with 60% stenosis left ICA secondary to radiation.  I discussed again the signs and symptoms of stroke.  Patient will follow up with neurology to determine if the 2 are related.  He would be candidate for transcribed artery stenting versus carotid endarterectomy.  He will follow-up in 6 months with repeat carotid duplex.  I discussed that if we are going to stent we would need to repeat the CT angio at that time.  Patient and his wife demonstrate very good understanding.     Waynetta Sandy MD [facility]

## 2021-01-20 ENCOUNTER — Other Ambulatory Visit (HOSPITAL_BASED_OUTPATIENT_CLINIC_OR_DEPARTMENT_OTHER): Payer: Self-pay

## 2021-01-20 ENCOUNTER — Other Ambulatory Visit (HOSPITAL_COMMUNITY): Payer: Self-pay

## 2021-01-25 ENCOUNTER — Other Ambulatory Visit: Payer: Self-pay

## 2021-02-05 ENCOUNTER — Other Ambulatory Visit (HOSPITAL_COMMUNITY): Payer: Self-pay

## 2021-02-06 ENCOUNTER — Other Ambulatory Visit (HOSPITAL_COMMUNITY): Payer: Self-pay

## 2021-03-01 ENCOUNTER — Encounter: Payer: Self-pay | Admitting: Cardiology

## 2021-03-01 ENCOUNTER — Other Ambulatory Visit (HOSPITAL_COMMUNITY): Payer: Self-pay

## 2021-03-01 ENCOUNTER — Other Ambulatory Visit: Payer: Self-pay

## 2021-03-01 ENCOUNTER — Ambulatory Visit (INDEPENDENT_AMBULATORY_CARE_PROVIDER_SITE_OTHER): Payer: No Typology Code available for payment source | Admitting: Cardiology

## 2021-03-01 VITALS — BP 112/72 | HR 62 | Ht 70.0 in | Wt 168.0 lb

## 2021-03-01 DIAGNOSIS — I10 Essential (primary) hypertension: Secondary | ICD-10-CM | POA: Diagnosis not present

## 2021-03-01 DIAGNOSIS — I6529 Occlusion and stenosis of unspecified carotid artery: Secondary | ICD-10-CM

## 2021-03-01 DIAGNOSIS — I6522 Occlusion and stenosis of left carotid artery: Secondary | ICD-10-CM | POA: Diagnosis not present

## 2021-03-01 HISTORY — DX: Occlusion and stenosis of unspecified carotid artery: I65.29

## 2021-03-01 MED ORDER — ROSUVASTATIN CALCIUM 10 MG PO TABS
10.0000 mg | ORAL_TABLET | Freq: Every day | ORAL | 3 refills | Status: DC
  Filled 2021-03-01: qty 90, 90d supply, fill #0
  Filled 2021-07-11: qty 90, 90d supply, fill #1
  Filled 2021-09-28: qty 90, 90d supply, fill #2
  Filled 2022-01-03: qty 90, 90d supply, fill #3

## 2021-03-01 NOTE — Progress Notes (Signed)
Cardiology Office Note:    Date:  03/01/2021   ID:  Bobby Gonzalez, DOB [birth year], MRN [id number]  PCP:  Carollee Herter, Alferd Apa, DO  Cardiologist:  Jenean Lindau, MD   Referring MD: Carollee Herter, Alferd Apa, *    ASSESSMENT:    1. Essential hypertension    PLAN:    In order of problems listed above:  Atherosclerosis of cerebral/carotid circulation: Secondary prevention stressed with the patient.  Importance of compliance with diet medication stressed and he vocalized understanding.  His blood pressure stable.  I reviewed his lipids however in view of significant carotid atherosclerosis I suggested statin therapy and he is agreeable.  We will do blood work today and start him on rosuvastatin 10 mg beginning tomorrow after review of blood work.  Diet was emphasized importance of regular exercise stressed he vocalized understanding. Results of echocardiogram and monitoring were discussed with the patient.  They are unremarkable. Patient will be seen in follow-up appointment in 6 months or earlier if the patient has any concerns    Medication Adjustments/Labs and Tests Ordered: Current medicines are reviewed at length with the patient today.  Concerns regarding medicines are outlined above.  No orders of the defined types were placed in this encounter.  No orders of the defined types were placed in this encounter.    No chief complaint on file.    History of Present Illness:    Bobby Gonzalez is a 55 y.o. male.  Patient was evaluated for syncope.  Patient has history of squamous cell cancer of the tonsil in the past.  He has chronic renal disease.  He denies any problems at this time and takes care of activities of daily living.  He has not had any syncopal episodes.  Interestingly carotid angiography with CT scan has revealed 60% stenosis.  Patient is not on statin therapy is.  Lipids are fine.  At the time of my evaluation, the patient is alert awake oriented and in no  distress.  Past Medical History:  Diagnosis Date   Acquired hypothyroidism 09/27/2020   Allergy    pcns   Cancer ([place]) 02/17/12 bx   left tonsil squamous cell ca,HPV positive, ,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 radiation and chemo 05-13-12(Dr. Lamonte Sakai)   Cardiac murmur 09/29/2020   Chronic kidney disease    right renal mass 7.3x6.7x6.8cm renal cell ca   Dehydration 05/13/2012   Difficult intubation    POSSIBLE DIFFICULT INTUBATION--S/P RADIATION FOR CANCER LEFT TONSIL--EATING BUT THROAT STILL SORE AND PT HAS THICK MUCUS   Diverticula of colon 03/10/12   multiple rectosigmoid colonic diverticula/ ct abd/pelvis   Dizziness 11/13/2020   Dry mouth 05/16/2014   Essential hypertension 10/11/2019   Gastro-esophageal reflux disease without esophagitis    GERD (gastroesophageal reflux disease)    Hearing loss of right ear 05/29/2020   History of cancer tonsil 09/20/2016   History of head and neck radiation 09/20/2016   History of radiation therapy 03/30/12-05/15/12   left tonsil   HTN (hypertension) 06-25-12   at first start of chemo for squamous cell cancer of neck-left   Hypothyroid 02/15/2013   Inguinal hernia 10/23/2012   Malignant tumor of kidney ([place]) 09/27/2020   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[place]) 09/27/2020   Multiple lung nodules on CT 05/16/2014   Pain    JOINT PAINS UPPER BODY AFTER RADIATION TREATMENTS   Personal history of malignant neoplasm of unspecified site of lip, oral cavity, and pharynx 09/27/2020  Personal history of other malignant neoplasm of kidney 09/27/2020   Phlegm in throat 05/13/2012   Preventative health care 05/16/2014   Preventive measure 05/16/2014   Renal cell carcinoma ([place]) 09/09/2012   Renal mass 03/30/2012   Sensorineural hearing loss, bilateral 09/20/2016   Solitary kidney, acquired 09/29/2020   Solitary pulmonary nodule 09/27/2020   Syncope 09/29/2020   Tinnitus aurium, right 05/29/2020   Tonsil cancer ([place]) 02/2012   SCCa of Left tonsil-S/P biopsy on 02/17/12.    Past  Surgical History:  Procedure Laterality Date   DIRECT LARYNGOSCOPY     S/P DL, Biopsy-Dr. Constance Holster. Pathology postive for SCCa of Left Tonsil   GASTROSTOMY TUBE PLACEMENT  03/02/12   INGUINAL HERNIA REPAIR Left 11/04/2012   Procedure: HERNIA REPAIR INGUINAL ADULT;  Surgeon: Joyice Faster. Cornett, MD;  [facility];  Service: General;  Laterality: Left;   INSERTION OF MESH Left 11/04/2012   Procedure: INSERTION OF MESH;  Surgeon: Joyice Faster. Cornett, MD;  [facility];  Service: General;  Laterality: Left;   LAPAROSCOPIC NEPHRECTOMY  06/29/2012   Procedure: LAPAROSCOPIC NEPHRECTOMY;  Surgeon: Dutch Gray, MD;  [facility];  Service: Urology;  Laterality: Right;   MULTIPLE TOOTH EXTRACTIONS  03/04/12   with DR. Mohorn   PEG TUBE PLACEMENT  06-25-12   remains in place abdomen-not using at present.   PEG TUBE REMOVAL  10/2012   tonsil biopsy  02/17/12   SCCa left tomnsil,HPV positive,spread to l cervical node   WISDOM TOOTH EXTRACTION      Current Medications: Current Meds  Medication Sig   cetirizine (ZYRTEC) 10 MG tablet Take 10 mg by mouth daily.   levothyroxine (SYNTHROID) 88 MCG tablet Take 88 mcg by mouth daily before breakfast.   Multiple Vitamin (MULTIVITAMIN) tablet Take 1 tablet by mouth daily.   pilocarpine (SALAGEN) 5 MG tablet Take 1 tablet (5 mg total) by mouth 3 (three) times daily.     Allergies:   Banana and Penicillins   Social History   Socioeconomic History   Marital status: Married    Spouse name: Not on file   Number of children: 2   Years of education: Not on file   Highest education level: Not on file  Occupational History    Employer: [employer]    Comment: car saleman  Tobacco Use   Smoking status: Never   Smokeless tobacco: Never  Vaping Use   Vaping Use: Never used  Substance and Sexual Activity   Alcohol use: No    Comment: Never drank alocohol.   Drug use: No   Sexual activity: Yes  Other Topics Concern   Not on file  Social History  Narrative   The patient is married and has 2 daughters.   Patient has never been a smoker.   Patient denies use of smokeless tobacco.   Patient has never drank alcohol.   Social Determinants of Health   Financial Resource Strain: Not on file  Food Insecurity: Not on file  Transportation Needs: Not on file  Physical Activity: Not on file  Stress: Not on file  Social Connections: Not on file     Family History: The patient's family history includes Cancer in his father; Hypertension in his brother and father; Stroke in his father; Throat cancer in his brother; Thyroid disease in his brother. There is no history of Colon cancer, Esophageal cancer, Rectal cancer, or Stomach cancer.  ROS:   Please see the history of present illness.  All other systems reviewed and are negative.  EKGs/Labs/Other Studies Reviewed:    The following studies were reviewed today: I discussed CT of the neck with the patient at length.   Recent Labs: 11/13/2020: ALT 16; BUN 15; Creatinine, Ser 1.13; Hemoglobin 15.9; Platelets 198.0; Potassium 4.4; Sodium 139; TSH 1.88  Recent Lipid Panel    Component Value Date/Time   CHOL 133 11/13/2020 0933   TRIG 56.0 11/13/2020 0933   HDL 47.00 11/13/2020 0933   CHOLHDL 3 11/13/2020 0933   VLDL 11.2 11/13/2020 0933   LDLCALC 75 11/13/2020 0933   LDLCALC 61 05/15/2020 0929    Physical Exam:    VS:  BP 112/72   Pulse 62   Ht 5\' 10"  (1.778 m)   Wt 168 lb (76.2 kg)   SpO2 98%   BMI 24.11 kg/m     Wt Readings from Last 3 Encounters:  03/01/21 168 lb (76.2 kg)  01/19/21 171 lb (77.6 kg)  12/11/20 182 lb (82.6 kg)     GEN: Patient is in no acute distress HEENT: Normal NECK: No JVD; No carotid bruits LYMPHATICS: No lymphadenopathy CARDIAC: Hear sounds regular, 2/6 systolic murmur at the apex. RESPIRATORY:  Clear to auscultation without rales, wheezing or rhonchi  ABDOMEN: Soft, non-tender, non-distended MUSCULOSKELETAL:  No edema; No deformity   SKIN: Warm and dry NEUROLOGIC:  Alert and oriented x 3 PSYCHIATRIC:  Normal affect   Signed, Jenean Lindau, MD  03/01/2021 8:42 AM    [facility]

## 2021-03-01 NOTE — Addendum Note (Signed)
Addended by: Truddie Hidden on: 03/01/2021 08:54 AM   Modules accepted: Orders

## 2021-03-01 NOTE — Patient Instructions (Signed)
Medication Instructions:  Your physician has recommended you make the following change in your medication:   Once we review your labs we will start you on Crestor 10 mg daily.  *If you need a refill on your cardiac medications before your next appointment, please call your pharmacy*   Lab Work: Your physician recommends that you have your lft's and lipids checked.  Your physician recommends that you return for lab work in: 6 weeks after starting Crestor. Around the second or third week of August. You need to have labs done when you are fasting.  You can come Monday through Friday 8:30 am to 12:00 pm and 1:15 to 4:30. You do not need to make an appointment as the order has already been placed. The labs you are going to have done are BMET, LFT and Lipids.  If you have labs (blood work) drawn today and your tests are completely normal, you will receive your results only by: Irene (if you have MyChart) OR A paper copy in the mail If you have any lab test that is abnormal or we need to change your treatment, we will call you to review the results.   Testing/Procedures: None ordered   Follow-Up: At [facility], you and your health needs are our priority.  As part of our continuing mission to provide you with exceptional heart care, we have created designated Provider Care Teams.  These Care Teams include your primary Cardiologist (physician) and Advanced Practice Providers (APPs -  Physician Assistants and Nurse Practitioners) who all work together to provide you with the care you need, when you need it.  We recommend signing up for the patient portal called "MyChart".  Sign up information is provided on this After Visit Summary.  MyChart is used to connect with patients for Virtual Visits (Telemedicine).  Patients are able to view lab/test results, encounter notes, upcoming appointments, etc.  Non-urgent messages can be sent to your provider as well.   To learn more about what you  can do with MyChart, go to [email or web address].    Your next appointment:   6 month(s)  The format for your next appointment:   In Person  Provider:   Jyl Heinz, MD   Other Instructions NA

## 2021-03-02 LAB — HEPATIC FUNCTION PANEL
ALT: 17 IU/L (ref 0–44)
AST: 18 IU/L (ref 0–40)
Albumin: 4.5 g/dL (ref 3.8–4.9)
Alkaline Phosphatase: 66 IU/L (ref 44–121)
Bilirubin Total: 0.7 mg/dL (ref 0.0–1.2)
Bilirubin, Direct: 0.15 mg/dL (ref 0.00–0.40)
Total Protein: 6.6 g/dL (ref 6.0–8.5)

## 2021-03-02 LAB — LIPID PANEL
Chol/HDL Ratio: 2.4 ratio (ref 0.0–5.0)
Cholesterol, Total: 147 mg/dL (ref 100–199)
HDL: 61 mg/dL (ref >39)
LDL Chol Calc (NIH): 76 mg/dL (ref 0–99)
Triglycerides: 47 mg/dL (ref 0–149)
VLDL Cholesterol Cal: 10 mg/dL (ref 5–40)

## 2021-03-07 ENCOUNTER — Ambulatory Visit: Payer: No Typology Code available for payment source | Admitting: Neurology

## 2021-03-07 ENCOUNTER — Encounter: Payer: Self-pay | Admitting: Neurology

## 2021-03-07 VITALS — BP 121/75 | HR 56 | Ht 70.0 in | Wt 170.4 lb

## 2021-03-07 DIAGNOSIS — R42 Dizziness and giddiness: Secondary | ICD-10-CM | POA: Diagnosis not present

## 2021-03-07 DIAGNOSIS — Z923 Personal history of irradiation: Secondary | ICD-10-CM | POA: Diagnosis not present

## 2021-03-07 DIAGNOSIS — I6522 Occlusion and stenosis of left carotid artery: Secondary | ICD-10-CM

## 2021-03-07 DIAGNOSIS — C099 Malignant neoplasm of tonsil, unspecified: Secondary | ICD-10-CM | POA: Diagnosis not present

## 2021-03-07 DIAGNOSIS — R001 Bradycardia, unspecified: Secondary | ICD-10-CM

## 2021-03-07 DIAGNOSIS — Z823 Family history of stroke: Secondary | ICD-10-CM

## 2021-03-07 NOTE — Patient Instructions (Signed)
Unfortunately, dizziness is a very common complaint but is often not due to a primary neurological reason or single underlying medical problem. Often, there a combination of factors, that result in dizziness. This includes blood pressure and heart rate fluctuations, medication side effects, blood sugar fluctuations, stress, vertigo, poor sleep with sleep deprivation, dehydration, and electrolyte disturbance or other metabolic and endocrinological reasons, meaning hormone related problems such as thyroid dysfunction.  We will investigate things further with a brain MRI. We will call you with the test results.   You have a history of left carotid artery stenosis but it is not a high-grade stenosis.  Since your symptoms are very brief and quick in onset, I am not convinced that it is actually a reduction in brain blood flow that causes her symptoms as opposed to a reduction in your heart rate.  You do have a slower than normal heart rate at baseline and when you turn your head, it can go down further as low as 50 as we saw today.  Please talk to your cardiologist which is your heart specialist about additional or extended heart rate monitoring, such as with a implanted device called a loop recorder.  You may be a good candidate for this.  I would recommend that you continue to be monitored with regards to your carotid artery stenosis as planned with Dr. Donzetta Matters.  We will consider a sleep study if you have residual snoring or your wife has noted pauses in your breathing while you sleep.  You do not have very many risk factors for stroke.  Sleep apnea can be a vascular risk factor and sleep testing can be done easily. Your neurological exam is normal which is reassuring.

## 2021-03-07 NOTE — Progress Notes (Signed)
Subjective:    Patient ID: Bobby Gonzalez is a 55 y.o. male.  HPI    Star Age, MD, PhD [facility] [address], [address] [place], [place] [address]  Dear Dr. Donzetta Matters,    I saw your patient, Bobby Gonzalez, upon your kind request in my neurologic clinic today for initial consultation of his dizziness.  The patient is accompanied by his wife today.  As you know, [name] is a 55 year old right-handed gentleman with an underlying complex medical history of kidney disease, with history of renal mass, diverticulosis, tonsillar cancer, with status post b/l tonsillectomy, also status post R nephrectomy, status post tonsillar biopsy, history of neck radiation, hypothyroidism, reflux disease, hypertension, allergies, lung nodules, hearing loss, tinnitus, carotid artery stenosis, and history of syncope, who reports an approximately 1 year history of intermittent dizziness particularly when he turns his head to the left.  Symptoms are fleeting, very brief at times, come on over seconds and relieved over seconds.  Sometimes symptoms are more sustained, they are not classic for spinning sensation but more a fleeting lightheadedness and blurry vision, feeling faint.  He has had instances where he turned his neck for just a brief few seconds to the left and felt uneasy.  He does not report any significant tunnel vision, no obvious double vision, does have a history of bradycardia.  He has seen his heart doctor and had a heart monitor for about 2 weeks.  I was able to review the report of his 2-week heart monitor, he wore the monitor between 09/29/2020 and 10/13/2020.  Lowest heart rate was 44.  He did not have any evidence of A. fib or pauses longer than 3 seconds.  He was found to have a left-sided carotid artery stenosis.  He has never had any strokelike symptoms such as one-sided weakness or numbness or tingling or droopy face or slurring of speech.  He has been a non-smoker.   His father had a stroke.  He is very mindful of his lifestyle, he tries to hydrate well, he works in Biomedical scientist.  He is physically very active.  He has not had any recent falls or injuries.  Sometimes he has a paresthesia that affects his scalp.  He denies any nausea vomiting or photophobia.  He has not had a brain scan.  I reviewed your office note from 01/19/2021.  He had a CT angiogram of the neck with and without contrast on 11/30/2020 and I reviewed the results:   IMPRESSION: 1. ~60% stenosis in the proximal left ICA. 2. Otherwise negative neck CTA.  He had a carotid Doppler ultrasound on 11/20/2020 and I reviewed the results:  IMPRESSION: Heterogeneous plaque at the bilateral carotid bifurcation, with discordant results regarding degree of stenosis by established duplex criteria. Peak velocity suggests 50%-69% stenosis, with the ICA/ CCA ratio suggesting a lesser degree of stenosis. If establishing a more accurate degree of stenosis is required, cerebral angiogram should be considered, or as a second best test, CTA.  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 radiation and chemo 05-13-12(Dr. Lamonte Sakai)   Cardiac murmur 09/29/2020   Chronic kidney disease    right renal mass 7.3x6.7x6.8cm renal cell ca   Dehydration 05/13/2012   Difficult intubation  POSSIBLE DIFFICULT INTUBATION--S/P RADIATION FOR CANCER LEFT TONSIL--EATING BUT THROAT STILL SORE AND PT HAS THICK MUCUS   Diverticula of colon 03/10/12   multiple rectosigmoid colonic diverticula/ ct abd/pelvis   Dizziness 11/13/2020   Dry mouth 05/16/2014   Essential hypertension 10/11/2019   Gastro-esophageal reflux disease without esophagitis    GERD (gastroesophageal reflux disease)    Hearing loss of right ear 05/29/2020   History of  cancer tonsil 09/20/2016   History of head and neck radiation 09/20/2016   History of radiation therapy 03/30/12-05/15/12   left tonsil   HTN (hypertension) 06-25-12   at first start of chemo for squamous cell cancer of neck-left   Hypothyroid 02/15/2013   Inguinal hernia 10/23/2012   Malignant tumor of kidney ([place]) 09/27/2020   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[place]) 09/27/2020   Multiple lung nodules on CT 05/16/2014   Pain    JOINT PAINS UPPER BODY AFTER RADIATION TREATMENTS   Personal history of malignant neoplasm of unspecified site of lip, oral cavity, and pharynx 09/27/2020   Personal history of other malignant neoplasm of kidney 09/27/2020   Phlegm in throat 05/13/2012   Preventative health care 05/16/2014   Preventive measure 05/16/2014   Renal cell carcinoma ([place]) 09/09/2012   Renal mass 03/30/2012   Sensorineural hearing loss, bilateral 09/20/2016   Solitary kidney, acquired 09/29/2020   Solitary pulmonary nodule 09/27/2020   Syncope 09/29/2020   Tinnitus aurium, right 05/29/2020   Tonsil cancer ([place]) 02/2012   SCCa of Left tonsil-S/P biopsy on 02/17/12.    His Past Surgical History Is Significant For: Past Surgical History:  Procedure Laterality Date   DIRECT LARYNGOSCOPY     S/P DL, Biopsy-Dr. Constance Holster. Pathology postive for SCCa of Left Tonsil   GASTROSTOMY TUBE PLACEMENT  03/02/12   INGUINAL HERNIA REPAIR Left 11/04/2012   Procedure: HERNIA REPAIR INGUINAL ADULT;  Surgeon: Joyice Faster. Cornett, MD;  Location: [facility];  Service: General;  Laterality: Left;   INSERTION OF MESH Left 11/04/2012   Procedure: INSERTION OF MESH;  Surgeon: Joyice Faster. Cornett, MD;  Location: [facility];  Service: General;  Laterality: Left;   LAPAROSCOPIC NEPHRECTOMY  06/29/2012   Procedure: LAPAROSCOPIC NEPHRECTOMY;  Surgeon: Dutch Gray, MD;  Location: [facility];  Service: Urology;  Laterality: Right;   MULTIPLE TOOTH EXTRACTIONS  03/04/12   with DR. Mohorn   PEG TUBE PLACEMENT  06-25-12   remains in place abdomen-not using at present.    PEG TUBE REMOVAL  10/2012   tonsil biopsy  02/17/12   SCCa left tomnsil,HPV positive,spread to l cervical node   WISDOM TOOTH EXTRACTION      His Family History Is Significant For: Family History  Problem Relation Age of Onset   Stroke Father    Cancer Father        Prostate   Hypertension Father    Thyroid disease Brother    Hypertension Brother    Throat cancer Brother    Colon cancer Neg Hx    Esophageal cancer Neg Hx    Rectal cancer Neg Hx    Stomach cancer Neg Hx     His Social History Is Significant For: Social History   Socioeconomic History   Marital status: Married    Spouse name: Not on file   Number of children: 2   Years of education: Not on file   Highest education level: Associate degree: academic program  Occupational History    Employer: [employer]    Comment: Immunologist  Tobacco Use   Smoking status: Never   Smokeless tobacco: Never  Vaping Use   Vaping Use: Never used  Substance and Sexual Activity   Alcohol use: No    Comment: Never drank alocohol.   Drug use: No   Sexual activity: Yes  Other Topics Concern   Not on file  Social History Narrative   The patient is married and has 2 daughters.Patient has never been a smoker.Patient denies use of smokeless tobacco.Patient has never drank alcohol.   Right handed   Caffeine: no coffee or soda. Tea in the weekend, drinks mainly flavored water due to only having 1 kidney   Social Determinants of Health   Financial Resource Strain: Not on file  Food Insecurity: Not on file  Transportation Needs: Not on file  Physical Activity: Not on file  Stress: Not on file  Social Connections: Not on file    His Allergies Are:  Allergies  Allergen Reactions   Banana Other (See Comments)    Unknown  Other reaction(s): Unknown   Penicillins     unknown Other reaction(s): Unknown  :   His Current Medications Are:  Outpatient Encounter Medications as of 03/07/2021  Medication Sig    cetirizine (ZYRTEC) 10 MG tablet Take 10 mg by mouth daily.   levothyroxine (SYNTHROID) 88 MCG tablet Take 88 mcg by mouth daily before breakfast.   Multiple Vitamin (MULTIVITAMIN) tablet Take 1 tablet by mouth daily.   pilocarpine (SALAGEN) 5 MG tablet Take 1 tablet (5 mg total) by mouth 3 (three) times daily.   rosuvastatin (CRESTOR) 10 MG tablet Take 1 tablet (10 mg total) by mouth daily.   No facility-administered encounter medications on file as of 03/07/2021.  :   Review of Systems:  Out of a complete 14 point review of systems, all are reviewed and negative with the exception of these symptoms as listed below:  Review of Systems  Neurological:        Pt was referred by vein specialist due a clogged artery in his left side of neck. Pt has been having dizzy spells/ blurred vision. Onset 1 year ago, initially thought it was vertigo. Dizziness mainly occurs by turning head, possible pinching on the nerve per pt. Varies on how often he turns his head. Tries to turn his whole body to avoid spells.    Objective:  Neurological Exam  Physical Exam Physical Examination:   Vitals:   03/07/21 0958  BP: 121/75  Pulse: (!) 56   On orthostatic testing, sitting blood pressure was 121/75 with a pulse of 65, standing blood pressure was 119/76 with pulse of 60.  He did not have any orthostatic lightheadedness, no vertiginous symptoms.  He did have mild symptoms when turning his head to the left for about 10 seconds, he did have a mild drop in heart rate from a baseline of about 58 to down to 53 and 1 time to 50.  General Examination: The patient is a very pleasant 55 y.o. male in no acute distress. He appears well-developed and well-nourished and well groomed.   HEENT: Normocephalic, atraumatic, pupils are equal, round and reactive to light and accommodation. Extraocular tracking is good without limitation to gaze excursion or nystagmus noted. Normal smooth pursuit is noted. Hearing is grossly  intact. Face is symmetric with normal facial animation and normal facial sensation. Speech is clear with no dysarthria noted. There is no hypophonia. There is no lip, neck/head, jaw or voice tremor. Neck is supple with full  range of passive and active motion. There are no carotid bruits on auscultation. Oropharynx exam reveals: mild mouth dryness, adequate dental hygiene and mild airway crowding, due to small airway entry, tonsils are absent, slightly wider uvula noted.  Mallampati class I.  Tongue protrudes centrally and palate elevates symmetrically.   Chest: Clear to auscultation without wheezing, rhonchi or crackles noted.  Heart: S1+S2+0, regular and normal without murmurs, rubs or gallops noted.   Abdomen: Soft, non-tender and non-distended with normal bowel sounds appreciated on auscultation.  Extremities: There is no pitting edema in the distal lower extremities bilaterally. Pedal pulses are intact.  Skin: Warm and dry without trophic changes noted.  No varicose veins noted.  Musculoskeletal: exam reveals no obvious joint deformities.   Neurologically:  Mental status: The patient is awake, alert and oriented in all 4 spheres. His immediate and remote memory, attention, language skills and fund of knowledge are appropriate. There is no evidence of aphasia, agnosia, apraxia or anomia. Speech is clear with normal prosody and enunciation. Thought process is linear. Mood is normal and affect is normal.  Cranial nerves II - XII are as described above under HEENT exam. In addition: shoulder shrug is normal with equal shoulder height noted. Motor exam: Normal bulk, strength and tone is noted. There is no drift, tremor or rebound. Romberg is negative. Reflexes are 2+ throughout. Babinski: Toes are flexor bilaterally. Fine motor skills and coordination: intact with normal finger taps, normal hand movements, normal rapid alternating patting, normal foot taps and normal foot agility.  Cerebellar  testing: No dysmetria or intention tremor on finger to nose testing. Heel to shin is unremarkable bilaterally. There is no truncal or gait ataxia.  Sensory exam: intact to light touch, vibration, temperature sense in the upper and lower extremities.  Gait, station and balance: He stands easily. No veering to one side is noted. No leaning to one side is noted. Posture is age-appropriate and stance is narrow based. Gait shows normal stride length and normal pace. No problems turning are noted. Tandem walk is unremarkable.  Assessment and Plan:  In summary, Ashby Dawes Asman is a very pleasant 55 y.o.-year old male with an underlying complex medical history of kidney disease, with history of renal mass, diverticulosis, tonsillar cancer, with status post b/l tonsillectomy, also status post R nephrectomy, status post tonsillar biopsy, history of neck radiation, hypothyroidism, reflux disease, hypertension, allergies, lung nodules, hearing loss, tinnitus, carotid artery stenosis, and history of syncope, who presents for evaluation of his intermittent dizziness.  Symptoms have been ongoing intermittently for the past year.  Symptoms are fleeting, short-lived and come on fairly quickly.  He has noticed issues primarily when he turns his head to the left.  He does not have any obvious orthostatic hypotension but did have some bradycardia on examination today intermittently.  By description of his symptoms I would doubt that it is related to decreased blood flow to the brain from carotid artery stenosis.  He has been found to have a 60% left internal carotid artery stenosis.  Symptoms are not telltale for vertigo. Differential diagnosis includes neck radiation related possible baroreceptor malfunction leading to baroreflex failure and possible carotid sinus syndrome with symptomatic bradycardia.  Decrease in blood flow to the brain with respect to carotid artery stenosis is possible but seems less likely given the quick  onset of his symptoms and brief nature of his symptoms.  I would recommend that he continue to be monitored with regular ultrasound or CT angiogram of  the neck arteries, as planned by you.  We will go ahead with a brain MRI without contrast to look for any underlying changes in his brain, history does not suggest any TIA or stroke thankfully.  Neurological exam is nonfocal and he is largely reassured.  He did have a mild decrease in heart rate upon orthostatic vital sign testing.  Also, with neck turned to the left in particular he did have decrease in heart rate as monitored by his smart watch.  He went from a baseline of 58 bpm to 50 bpm with some symptoms reported.  He is advised to talk to his cardiologist about more extended monitoring of his heart rate, potentially with a loop monitor if feasible.  We will go ahead and request insurance authorization for a brain MRI without contrast and call him with the results.  For now, we will plan a follow-up in this clinic as needed.  He is advised to follow-up with you as scheduled.  We also talked about potentially pursuing sleep testing.  He has a history of snoring but after his tonsillectomy he reports that his wife noted less snoring.  If she has any concern regarding residual snoring or even witnessed apneas we can also pursue sleep testing through our office.  He is agreeable to this approach.  Thank you very much for allowing me to participate in the care of this nice patient. If I can be of any further assistance to you please do not hesitate to call me at [phone].  Sincerely,   Star Age, MD, PhD

## 2021-03-12 ENCOUNTER — Telehealth: Payer: Self-pay | Admitting: Neurology

## 2021-03-12 NOTE — Telephone Encounter (Signed)
I called [facility] @ [phone] and spoke with Tabitha to check PA requirements for MRI brain wo contrast (51025). Tabitha transferred me to [facility] to give clinicals. Hailey was able to approve the MRI. Auth #[id number] (03/12/21- 04/11/21). I will call patient to schedule at [facility].

## 2021-03-13 ENCOUNTER — Other Ambulatory Visit (HOSPITAL_COMMUNITY): Payer: Self-pay

## 2021-03-13 ENCOUNTER — Ambulatory Visit: Payer: No Typology Code available for payment source

## 2021-03-13 DIAGNOSIS — R42 Dizziness and giddiness: Secondary | ICD-10-CM | POA: Diagnosis not present

## 2021-03-13 DIAGNOSIS — Z923 Personal history of irradiation: Secondary | ICD-10-CM | POA: Diagnosis not present

## 2021-03-13 DIAGNOSIS — C099 Malignant neoplasm of tonsil, unspecified: Secondary | ICD-10-CM

## 2021-03-13 DIAGNOSIS — R001 Bradycardia, unspecified: Secondary | ICD-10-CM

## 2021-03-13 DIAGNOSIS — Z823 Family history of stroke: Secondary | ICD-10-CM

## 2021-03-13 DIAGNOSIS — I6522 Occlusion and stenosis of left carotid artery: Secondary | ICD-10-CM | POA: Diagnosis not present

## 2021-04-06 ENCOUNTER — Other Ambulatory Visit (HOSPITAL_COMMUNITY): Payer: Self-pay

## 2021-04-24 ENCOUNTER — Other Ambulatory Visit (HOSPITAL_COMMUNITY): Payer: Self-pay

## 2021-05-09 ENCOUNTER — Other Ambulatory Visit (HOSPITAL_COMMUNITY): Payer: Self-pay

## 2021-05-09 ENCOUNTER — Other Ambulatory Visit: Payer: Self-pay | Admitting: Family Medicine

## 2021-05-09 MED ORDER — LEVOTHYROXINE SODIUM 88 MCG PO TABS
ORAL_TABLET | Freq: Every day | ORAL | 2 refills | Status: DC
  Filled 2021-05-09: qty 30, 30d supply, fill #0
  Filled 2021-06-09: qty 30, 30d supply, fill #1
  Filled 2021-07-13: qty 30, 30d supply, fill #2

## 2021-05-21 ENCOUNTER — Ambulatory Visit (INDEPENDENT_AMBULATORY_CARE_PROVIDER_SITE_OTHER): Payer: No Typology Code available for payment source | Admitting: Family Medicine

## 2021-05-21 ENCOUNTER — Encounter: Payer: Self-pay | Admitting: Family Medicine

## 2021-05-21 ENCOUNTER — Other Ambulatory Visit (HOSPITAL_COMMUNITY): Payer: Self-pay

## 2021-05-21 ENCOUNTER — Other Ambulatory Visit: Payer: Self-pay

## 2021-05-21 VITALS — BP 120/80 | HR 47 | Temp 97.7°F | Resp 18 | Ht 70.0 in | Wt 175.2 lb

## 2021-05-21 DIAGNOSIS — Z23 Encounter for immunization: Secondary | ICD-10-CM | POA: Diagnosis not present

## 2021-05-21 DIAGNOSIS — I10 Essential (primary) hypertension: Secondary | ICD-10-CM | POA: Diagnosis not present

## 2021-05-21 DIAGNOSIS — E785 Hyperlipidemia, unspecified: Secondary | ICD-10-CM

## 2021-05-21 DIAGNOSIS — E039 Hypothyroidism, unspecified: Secondary | ICD-10-CM | POA: Diagnosis not present

## 2021-05-21 HISTORY — DX: Hyperlipidemia, unspecified: E78.5

## 2021-05-21 MED ORDER — PILOCARPINE HCL 5 MG PO TABS
5.0000 mg | ORAL_TABLET | Freq: Three times a day (TID) | ORAL | 2 refills | Status: DC
  Filled 2021-05-21 – 2021-06-18 (×2): qty 90, 30d supply, fill #0
  Filled 2021-08-08: qty 180, 60d supply, fill #1

## 2021-05-21 NOTE — Assessment & Plan Note (Signed)
Well controlled, no changes to meds. Encouraged heart healthy diet such as the DASH diet and exercise as tolerated.  °

## 2021-05-21 NOTE — Assessment & Plan Note (Signed)
Check labs con't meds 

## 2021-05-21 NOTE — Progress Notes (Addendum)
Established Patient Office Visit  Subjective:  Patient ID: Bobby Gonzalez, male    DOB: September 05, 1965  Age: 55 y.o. MRN: [id number]  CC:  Chief Complaint  Patient presents with  . Hypothyroidism  . Follow-up    HPI Bobby Gonzalez presents for f/u thyroid   pt has seen cardiology and labs were done with them.   Pt with no complaints He does have a mole on his scalp that he wants me to look at.    Past Medical History:  Diagnosis Date  . Acquired hypothyroidism 09/27/2020  . Allergy    pcns  . 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 radiation and chemo 05-13-12(Dr. Lamonte Gonzalez)  . Cardiac murmur 09/29/2020  . Chronic kidney disease    right renal mass 7.3x6.7x6.8cm renal cell ca  . Dehydration 05/13/2012  . Difficult intubation    POSSIBLE DIFFICULT INTUBATION--S/P RADIATION FOR CANCER LEFT TONSIL--EATING BUT THROAT STILL SORE AND PT HAS THICK MUCUS  . Diverticula of colon 03/10/12   multiple rectosigmoid colonic diverticula/ ct abd/pelvis  . Dizziness 11/13/2020  . Dry mouth 05/16/2014  . Essential hypertension 10/11/2019  . Gastro-esophageal reflux disease without esophagitis   . GERD (gastroesophageal reflux disease)   . Hearing loss of right ear 05/29/2020  . History of cancer tonsil 09/20/2016  . History of head and neck radiation 09/20/2016  . History of radiation therapy 03/30/12-05/15/12   left tonsil  . HTN (hypertension) 06-25-12   at first start of chemo for squamous cell cancer of neck-left  . Hypothyroid 02/15/2013  . Inguinal hernia 10/23/2012  . Malignant tumor of kidney ([place]) 09/27/2020  . 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[place]) 09/27/2020  . Multiple lung nodules on CT 05/16/2014  . Pain    JOINT PAINS UPPER BODY AFTER RADIATION TREATMENTS  . Personal history of malignant neoplasm of unspecified site of lip, oral cavity, and pharynx 09/27/2020  . Personal history of other malignant neoplasm of kidney 09/27/2020  .  Phlegm in throat 05/13/2012  . Preventative health care 05/16/2014  . Preventive measure 05/16/2014  . Renal cell carcinoma ([place]) 09/09/2012  . Renal mass 03/30/2012  . Sensorineural hearing loss, bilateral 09/20/2016  . Solitary kidney, acquired 09/29/2020  . Solitary pulmonary nodule 09/27/2020  . Syncope 09/29/2020  . Tinnitus aurium, right 05/29/2020  . Tonsil cancer ([place]) 02/2012   SCCa of Left tonsil-S/P biopsy on 02/17/12.    Past Surgical History:  Procedure Laterality Date  . DIRECT LARYNGOSCOPY     S/P DL, Biopsy-Dr. Constance Gonzalez. Pathology postive for SCCa of Left Tonsil  . GASTROSTOMY TUBE PLACEMENT  03/02/12  . INGUINAL HERNIA REPAIR Left 11/04/2012   Procedure: HERNIA REPAIR INGUINAL ADULT;  Surgeon: Bobby Faster. Cornett, MD;  Location: [facility];  Service: General;  Laterality: Left;  . INSERTION OF MESH Left 11/04/2012   Procedure: INSERTION OF MESH;  Surgeon: Bobby Faster. Cornett, MD;  Location: [facility];  Service: General;  Laterality: Left;  . LAPAROSCOPIC NEPHRECTOMY  06/29/2012   Procedure: LAPAROSCOPIC NEPHRECTOMY;  Surgeon: Bobby Gray, MD;  Location: [facility];  Service: Urology;  Laterality: Right;  . MULTIPLE TOOTH EXTRACTIONS  03/04/12   with DR. Mohorn  . PEG TUBE PLACEMENT  06-25-12   remains in place abdomen-not using at present.  . PEG TUBE REMOVAL  10/2012  . tonsil biopsy  02/17/12   SCCa left tomnsil,HPV positive,spread to l cervical node  . WISDOM TOOTH EXTRACTION  Family History  Problem Relation Age of Onset  . Stroke Father   . Cancer Father        Prostate  . Hypertension Father   . Thyroid disease Brother   . Hypertension Brother   . Throat cancer Brother   . Colon cancer Neg Hx   . Esophageal cancer Neg Hx   . Rectal cancer Neg Hx   . Stomach cancer Neg Hx     Social History   Socioeconomic History  . Marital status: Married    Spouse name: Not on file  . Number of children: 2  . Years of education: Not on file  . Highest education level: Associate degree:  academic program  Occupational History    Employer: [employer]    Comment: car saleman  Tobacco Use  . Smoking status: Never  . Smokeless tobacco: Never  Vaping Use  . Vaping Use: Never used  Substance and Sexual Activity  . Alcohol use: No    Comment: Never drank alocohol.  . Drug use: No  . Sexual activity: Yes  Other Topics Concern  . Not on file  Social History Narrative   The patient is married and has 2 daughters.Patient has never been a smoker.Patient denies use of smokeless tobacco.Patient has never drank alcohol.   Right handed   Caffeine: no coffee or soda. Tea in the weekend, drinks mainly flavored water due to only having 1 kidney   Social Determinants of Health   Financial Resource Strain: Not on file  Food Insecurity: Not on file  Transportation Needs: Not on file  Physical Activity: Not on file  Stress: Not on file  Social Connections: Not on file  Intimate Partner Violence: Not on file    Outpatient Medications Prior to Visit  Medication Sig Dispense Refill  . cetirizine (ZYRTEC) 10 MG tablet Take 10 mg by mouth daily.    Marland Kitchen levothyroxine (SYNTHROID) 88 MCG tablet Take 88 mcg by mouth daily before breakfast.    . levothyroxine (SYNTHROID) 88 MCG tablet TAKE 1 TABLET BY MOUTH DAILY 30 tablet 2  . Multiple Vitamin (MULTIVITAMIN) tablet Take 1 tablet by mouth daily.    . rosuvastatin (CRESTOR) 10 MG tablet Take 1 tablet (10 mg total) by mouth daily. 90 tablet 3  . pilocarpine (SALAGEN) 5 MG tablet Take 1 tablet (5 mg total) by mouth 3 (three) times daily. 90 tablet 2   No facility-administered medications prior to visit.    Allergies  Allergen Reactions  . Banana Other (See Comments)    Unknown  Other reaction(s): Unknown  . Penicillins     unknown Other reaction(s): Unknown    ROS Review of Systems  Constitutional:  Negative for appetite change, diaphoresis, fatigue and unexpected weight change.  Eyes:  Negative for pain, redness and  visual disturbance.  Respiratory:  Negative for cough, chest tightness, shortness of breath and wheezing.   Cardiovascular:  Negative for chest pain, palpitations and leg swelling.  Endocrine: Negative for cold intolerance, heat intolerance, polydipsia, polyphagia and polyuria.  Genitourinary:  Negative for difficulty urinating, dysuria and frequency.  Neurological:  Negative for dizziness, light-headedness, numbness and headaches.     Objective:    Physical Exam Vitals and nursing note reviewed.  Constitutional:      Appearance: He is well-developed.  HENT:     Head: Normocephalic and atraumatic.      Comments: + mole on scalp < 1 cm ----  brown , regular borders  c/w sk  Eyes:     Pupils: Pupils are equal, round, and reactive to light.  Neck:     Thyroid: No thyromegaly.  Cardiovascular:     Rate and Rhythm: Normal rate and regular rhythm.     Heart sounds: No murmur heard. Pulmonary:     Effort: Pulmonary effort is normal. No respiratory distress.     Breath sounds: Normal breath sounds. No wheezing or rales.  Chest:     Chest wall: No tenderness.  Musculoskeletal:        General: No tenderness.     Cervical back: Normal range of motion and neck supple.  Skin:    General: Skin is warm and dry.  Neurological:     Mental Status: He is alert and oriented to person, place, and time.  Psychiatric:        Behavior: Behavior normal.        Thought Content: Thought content normal.        Judgment: Judgment normal.   BP 120/80 (BP Location: Left Arm, Patient Position: Sitting, Cuff Size: Normal)   Pulse (!) 47   Temp 97.7 F (36.5 C) (Oral)   Resp 18   Ht '5\' 10"'  (1.778 m)   Wt 175 lb 3.2 oz (79.5 kg)   SpO2 97%   BMI 25.14 kg/m  Wt Readings from Last 3 Encounters:  05/21/21 175 lb 3.2 oz (79.5 kg)  03/07/21 170 lb 6.4 oz (77.3 kg)  03/01/21 168 lb (76.2 kg)     Health Maintenance Due  Topic Date Due  . Zoster Vaccines- Shingrix (1 of 2) Never done  . COVID-19  Vaccine (3 - Pfizer risk series) 01/14/2020    There are no preventive care reminders to display for this patient.  Lab Results  Component Value Date   TSH 1.88 11/13/2020   Lab Results  Component Value Date   WBC 6.0 11/13/2020   HGB 15.9 11/13/2020   HCT 46.8 11/13/2020   MCV 91.0 11/13/2020   PLT 198.0 11/13/2020   Lab Results  Component Value Date   NA 139 11/13/2020   K 4.4 11/13/2020   CHLORIDE 106 02/10/2017   CO2 31 11/13/2020   GLUCOSE 109 (H) 11/13/2020   BUN 15 11/13/2020   CREATININE 1.13 11/13/2020   BILITOT 0.7 03/01/2021   ALKPHOS 66 03/01/2021   AST 18 03/01/2021   ALT 17 03/01/2021   PROT 6.6 03/01/2021   ALBUMIN 4.5 03/01/2021   CALCIUM 9.3 11/13/2020   ANIONGAP 7 02/11/2018   EGFR 68 (L) 02/10/2017   GFR 73.89 11/13/2020   Lab Results  Component Value Date   CHOL 147 03/01/2021   Lab Results  Component Value Date   HDL 61 03/01/2021   Lab Results  Component Value Date   LDLCALC 76 03/01/2021   Lab Results  Component Value Date   TRIG 47 03/01/2021   Lab Results  Component Value Date   CHOLHDL 2.4 03/01/2021   No results found for: HGBA1C    Assessment & Plan:   Problem List Items Addressed This Visit       Unprioritized   Hypothyroid - Primary   Relevant Orders   Thyroid Panel With TSH   Acquired hypothyroidism    Check labs  con't meds       Essential hypertension    Well controlled, no changes to meds. Encouraged heart healthy diet such as the DASH diet and exercise as tolerated.       Hyperlipidemia  Tolerating statin, encouraged heart healthy diet, avoid trans fats, minimize simple carbs and saturated fats. Increase exercise as tolerated  Lab Results  Component Value Date   CHOL 147 03/01/2021   HDL 61 03/01/2021   LDLCALC 76 03/01/2021   TRIG 47 03/01/2021   CHOLHDL 2.4 03/01/2021        Other Visit Diagnoses     Need for influenza vaccination       Relevant Orders   Flu Vaccine QUAD 71moIM  (Fluarix, Fluzone & Alfiuria Quad PF) (Completed)       Meds ordered this encounter  Medications  . pilocarpine (SALAGEN) 5 MG tablet    Sig: Take 1 tablet (5 mg total) by mouth 3 (three) times daily.    Dispense:  90 tablet    Refill:  2    Follow-up: Return in about 6 months (around 11/18/2021), or if symptoms worsen or fail to improve, for fasting, annual exam.    YAnn Held DO

## 2021-05-21 NOTE — Patient Instructions (Signed)

## 2021-05-21 NOTE — Assessment & Plan Note (Signed)
Tolerating statin, encouraged heart healthy diet, avoid trans fats, minimize simple carbs and saturated fats. Increase exercise as tolerated  Lab Results  Component Value Date   CHOL 147 03/01/2021   HDL 61 03/01/2021   LDLCALC 76 03/01/2021   TRIG 47 03/01/2021   CHOLHDL 2.4 03/01/2021

## 2021-05-22 LAB — THYROID PANEL WITH TSH
Free Thyroxine Index: 2.9 (ref 1.4–3.8)
T3 Uptake: 33 % (ref 22–35)
T4, Total: 8.7 ug/dL (ref 4.9–10.5)
TSH: 1.4 mIU/L (ref 0.40–4.50)

## 2021-05-29 ENCOUNTER — Other Ambulatory Visit (HOSPITAL_COMMUNITY): Payer: Self-pay

## 2021-06-09 ENCOUNTER — Other Ambulatory Visit (HOSPITAL_COMMUNITY): Payer: Self-pay

## 2021-06-18 ENCOUNTER — Other Ambulatory Visit (HOSPITAL_COMMUNITY): Payer: Self-pay

## 2021-06-18 ENCOUNTER — Other Ambulatory Visit (HOSPITAL_BASED_OUTPATIENT_CLINIC_OR_DEPARTMENT_OTHER): Payer: Self-pay

## 2021-07-11 ENCOUNTER — Other Ambulatory Visit (HOSPITAL_COMMUNITY): Payer: Self-pay

## 2021-07-12 ENCOUNTER — Other Ambulatory Visit (HOSPITAL_COMMUNITY): Payer: Self-pay

## 2021-07-13 ENCOUNTER — Other Ambulatory Visit (HOSPITAL_COMMUNITY): Payer: Self-pay

## 2021-08-02 ENCOUNTER — Other Ambulatory Visit: Payer: Self-pay | Admitting: Family Medicine

## 2021-08-03 ENCOUNTER — Other Ambulatory Visit (HOSPITAL_COMMUNITY): Payer: Self-pay

## 2021-08-03 MED ORDER — LEVOTHYROXINE SODIUM 88 MCG PO TABS
ORAL_TABLET | Freq: Every day | ORAL | 2 refills | Status: DC
  Filled 2021-08-03: qty 30, fill #0
  Filled 2021-08-08: qty 90, 90d supply, fill #0

## 2021-08-08 ENCOUNTER — Other Ambulatory Visit (HOSPITAL_COMMUNITY): Payer: Self-pay

## 2021-08-09 ENCOUNTER — Other Ambulatory Visit (HOSPITAL_COMMUNITY): Payer: Self-pay

## 2021-09-05 ENCOUNTER — Ambulatory Visit (HOSPITAL_COMMUNITY)
Admission: RE | Admit: 2021-09-05 | Discharge: 2021-09-05 | Disposition: A | Discharge status: Home / Self Care | Payer: No Typology Code available for payment source | Source: Ambulatory Visit | Attending: Vascular Surgery | Admitting: Vascular Surgery

## 2021-09-05 ENCOUNTER — Ambulatory Visit (INDEPENDENT_AMBULATORY_CARE_PROVIDER_SITE_OTHER): Payer: No Typology Code available for payment source | Admitting: Vascular Surgery

## 2021-09-05 ENCOUNTER — Other Ambulatory Visit: Payer: Self-pay

## 2021-09-05 VITALS — BP 141/84 | HR 67 | Temp 98.4°F | Resp 20 | Ht 70.0 in | Wt 188.0 lb

## 2021-09-05 DIAGNOSIS — I6522 Occlusion and stenosis of left carotid artery: Secondary | ICD-10-CM | POA: Diagnosis present

## 2021-09-05 NOTE — Progress Notes (Signed)
Patient ID: Bobby Gonzalez, male   DOB: 1966-05-25, 56 y.o.   MRN: [id number]  Reason for Consult: Follow-up   Referred by Carollee Herter, Alferd Apa, *  Subjective:     HPI:  Bobby Gonzalez is a 56 y.o. male has a history of left carotid artery stenosis with history of left neck radiation as well as hypertension.  He is a lifelong non-smoker.  When he turns his head to the left he feels dizziness.  He is compensated by usually turning most of his body.  This has been quite persistent now since I initially saw him.  He does not have any history of stroke, TIA or amaurosis.  He has been evaluated by neurology with no underlying cause found.  He follows up today with repeat carotid duplex.  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 radiation and chemo 05-13-12(Dr. Lamonte Sakai)   Cardiac murmur 09/29/2020   Carotid atherosclerosis 03/01/2021   Chronic kidney disease    right renal mass 7.3x6.7x6.8cm renal cell ca   Dehydration 05/13/2012   Difficult intubation    POSSIBLE DIFFICULT INTUBATION--S/P RADIATION FOR CANCER LEFT TONSIL--EATING BUT THROAT STILL SORE AND PT HAS THICK MUCUS   Diverticula of colon 03/10/12   multiple rectosigmoid colonic diverticula/ ct abd/pelvis   Dizziness 11/13/2020   Dry mouth 05/16/2014   Essential hypertension 10/11/2019   Gastro-esophageal reflux disease without esophagitis    GERD (gastroesophageal reflux disease)    Hearing loss of right ear 05/29/2020   History of cancer tonsil 09/20/2016   History of head and neck radiation 09/20/2016   History of radiation therapy 03/30/12-05/15/12   left tonsil   HTN (hypertension) 06-25-12   at first start of chemo for squamous cell cancer of neck-left   Hyperlipidemia 05/21/2021   Hypothyroid 02/15/2013   Inguinal hernia 10/23/2012   Malignant tumor of kidney ([place])  09/27/2020   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[place]) 09/27/2020   Multiple lung nodules on CT 05/16/2014   Pain    JOINT PAINS UPPER BODY AFTER RADIATION TREATMENTS   Personal history of malignant neoplasm of unspecified site of lip, oral cavity, and pharynx 09/27/2020   Personal history of other malignant neoplasm of kidney 09/27/2020   Phlegm in throat 05/13/2012   Preventative health care 05/16/2014   Preventive measure 05/16/2014   Renal cell carcinoma ([place]) 09/09/2012   Renal mass 03/30/2012   Sensorineural hearing loss, bilateral 09/20/2016   Solitary kidney, acquired 09/29/2020   Solitary pulmonary nodule 09/27/2020   Syncope 09/29/2020   Tinnitus aurium, right 05/29/2020   Tonsil cancer ([place]) 02/2012   SCCa of Left tonsil-S/P biopsy on 02/17/12.   Family History  Problem Relation Age of Onset   Stroke Father    Cancer Father        Prostate   Hypertension Father    Thyroid disease Brother    Hypertension Brother    Throat cancer Brother    Colon cancer Neg Hx    Esophageal cancer Neg Hx    Rectal cancer Neg Hx    Stomach cancer Neg Hx    Past Surgical History:  Procedure Laterality Date   DIRECT LARYNGOSCOPY     S/P DL, Biopsy-Dr. Constance Holster. Pathology postive for SCCa of Left Tonsil   GASTROSTOMY TUBE PLACEMENT  03/02/12   INGUINAL  HERNIA REPAIR Left 11/04/2012   Procedure: HERNIA REPAIR INGUINAL ADULT;  Surgeon: Joyice Faster. Cornett, MD;  Location: [facility];  Service: General;  Laterality: Left;   INSERTION OF MESH Left 11/04/2012   Procedure: INSERTION OF MESH;  Surgeon: Joyice Faster. Cornett, MD;  Location: [facility];  Service: General;  Laterality: Left;   LAPAROSCOPIC NEPHRECTOMY  06/29/2012   Procedure: LAPAROSCOPIC NEPHRECTOMY;  Surgeon: Dutch Gray, MD;  Location: [facility];  Service: Urology;  Laterality: Right;   MULTIPLE TOOTH EXTRACTIONS  03/04/12   with DR. Mohorn   PEG TUBE PLACEMENT  06-25-12   remains in place abdomen-not using at present.   PEG TUBE REMOVAL  10/2012   tonsil biopsy  02/17/12    SCCa left tomnsil,HPV positive,spread to l cervical node   WISDOM TOOTH EXTRACTION      Short Social History:  Social History   Tobacco Use   Smoking status: Never   Smokeless tobacco: Never  Substance Use Topics   Alcohol use: No    Comment: Never drank alocohol.    Allergies  Allergen Reactions   Banana    Penicillins     Current Outpatient Medications  Medication Sig Dispense Refill   cetirizine (ZYRTEC) 10 MG tablet Take 10 mg by mouth daily.     levothyroxine (SYNTHROID) 88 MCG tablet Take 88 mcg by mouth daily before breakfast.     levothyroxine (SYNTHROID) 88 MCG tablet TAKE 1 TABLET BY MOUTH DAILY 30 tablet 2   Multiple Vitamin (MULTIVITAMIN) tablet Take 1 tablet by mouth daily.     pilocarpine (SALAGEN) 5 MG tablet Take 1 tablet by mouth 3 (three) times daily. 90 tablet 2   rosuvastatin (CRESTOR) 10 MG tablet Take 1 tablet (10 mg total) by mouth daily. 90 tablet 3   No current facility-administered medications for this visit.    Review of Systems  Constitutional:  Constitutional negative. HENT: HENT negative.  Eyes: Eyes negative.  Cardiovascular: Cardiovascular negative.  GI: Gastrointestinal negative.  Musculoskeletal: Musculoskeletal negative.  Skin: Skin negative.  Neurological: Positive for dizziness.  Hematologic: Hematologic/lymphatic negative.  Psychiatric: Psychiatric negative.       Objective:  Objective   Vitals:   09/05/21 1531  BP: (!) 141/84  Pulse: 67  Resp: 20  Temp: 98.4 F (36.9 C)  SpO2: 95%  Weight: 188 lb (85.3 kg)  Height: 5\' 10"  (1.778 m)   Body mass index is 26.98 kg/m.  Physical Exam HENT:     Head: Normocephalic.     Nose:     Comments: Wearing a mask Eyes:     Pupils: Pupils are equal, round, and reactive to light.  Neck:     Vascular: No carotid bruit.     Comments: Some increased thickness in left neck skin Cardiovascular:     Rate and Rhythm: Normal rate.     Pulses: Normal pulses.  Pulmonary:      Effort: Pulmonary effort is normal.     Breath sounds: Normal breath sounds.  Abdominal:     General: Abdomen is flat.     Palpations: Abdomen is soft.  Musculoskeletal:     Cervical back: Normal range of motion.     Right lower leg: No edema.     Left lower leg: No edema.  Skin:    Capillary Refill: Capillary refill takes less than 2 seconds.  Neurological:     Mental Status: He is alert.  Psychiatric:        Mood and Affect: Mood  normal.        Behavior: Behavior normal.        Thought Content: Thought content normal.    Data: Right Carotid Findings:  +----------+--------+--------+--------+------------------+-----------------  ----+              PSV cm/s EDV cm/s Stenosis Plaque Description Comments                 +----------+--------+--------+--------+------------------+-----------------  ----+   CCA Prox   133      29                                                            +----------+--------+--------+--------+------------------+-----------------  ----+   CCA Mid    158      38                                                            +----------+--------+--------+--------+------------------+-----------------  ----+   CCA Distal 151      34                                                            +----------+--------+--------+--------+------------------+-----------------  ----+   ICA Prox   168      48       40-59%   homogeneous        although  velocities                                                               suggest 40-59%                                                                     stenosis,  minimal                                                                 disease is  visualized   +----------+--------+--------+--------+------------------+-----------------  ----+   ICA Mid    107      30                                                            +----------+--------+--------+--------+------------------+-----------------  ----+  ICA Distal 101      35                                                            +----------+--------+--------+--------+------------------+-----------------  ----+   ECA        187      35                                                            +----------+--------+--------+--------+------------------+-----------------  ----+   +----------+--------+-------+----------------+-------------------+              PSV cm/s EDV cms Describe         Arm Pressure (mmHG)   +----------+--------+-------+----------------+-------------------+   Subclavian 194              Multiphasic, WNL                       +----------+--------+-------+----------------+-------------------+   +---------+--------+--+--------+--+---------+   Vertebral PSV cm/s 46 EDV cm/s 10 Antegrade   +---------+--------+--+--------+--+---------+       Left Carotid Findings:  +----------+--------+--------+--------+------------------+--------+              PSV cm/s EDV cm/s Stenosis Plaque Description Comments   +----------+--------+--------+--------+------------------+--------+   CCA Prox   120      27                                              +----------+--------+--------+--------+------------------+--------+   CCA Mid    133      32                                              +----------+--------+--------+--------+------------------+--------+   CCA Distal 112      28                homogeneous                   +----------+--------+--------+--------+------------------+--------+   ICA Prox   259      82       60-79%   homogeneous                   +----------+--------+--------+--------+------------------+--------+   ICA Mid    119      35                                              +----------+--------+--------+--------+------------------+--------+   ICA Distal 116      35                                              +----------+--------+--------+--------+------------------+--------+  ECA        164      39                                               +----------+--------+--------+--------+------------------+--------+   +----------+--------+--------+----------------+-------------------+              PSV cm/s EDV cm/s Describe         Arm Pressure (mmHG)   +----------+--------+--------+----------------+-------------------+   Subclavian 158               Multiphasic, WNL                       +----------+--------+--------+----------------+-------------------+   +---------+--------+--+--------+--+---------+   Vertebral PSV cm/s 66 EDV cm/s 13 Antegrade   +---------+--------+--+--------+--+---------+      Summary:  Right Carotid: Velocities in the right ICA are consistent with a 40-59%                 stenosis. However, minimal disease is visualized.   Left Carotid: Velocities in the left ICA are consistent with a 60-79%  stenosis.   Vertebrals:  Bilateral vertebral arteries demonstrate antegrade flow.  Subclavians: Normal flow hemodynamics were seen in bilateral subclavian               arteries.      Assessment/Plan:    56 year old male with history of left carotid artery stenosis that appears to be asymptomatic approximately 60%.  He will follow-up in 6 months with repeat carotid duplex.  I discussed with him his options being carotid endarterectomy versus stenting versus continued noninterventional therapy.  He demonstrates good understanding we will follow-up in 6 months with carotid duplex at that point if this is stable we could consider 1 year follow-up.  We discussed the signs and symptoms of stroke and the knows to seek emergent medical attention if these happen.     Waynetta Sandy MD [facility]

## 2021-09-06 ENCOUNTER — Other Ambulatory Visit: Payer: Self-pay

## 2021-09-06 DIAGNOSIS — I6522 Occlusion and stenosis of left carotid artery: Secondary | ICD-10-CM

## 2021-09-12 ENCOUNTER — Other Ambulatory Visit (HOSPITAL_BASED_OUTPATIENT_CLINIC_OR_DEPARTMENT_OTHER): Payer: Self-pay

## 2021-09-12 ENCOUNTER — Encounter: Payer: Self-pay | Admitting: Cardiology

## 2021-09-12 ENCOUNTER — Ambulatory Visit (INDEPENDENT_AMBULATORY_CARE_PROVIDER_SITE_OTHER): Payer: No Typology Code available for payment source | Admitting: Cardiology

## 2021-09-12 ENCOUNTER — Other Ambulatory Visit: Payer: Self-pay

## 2021-09-12 ENCOUNTER — Other Ambulatory Visit (HOSPITAL_COMMUNITY): Payer: Self-pay

## 2021-09-12 VITALS — BP 152/76 | HR 60 | Ht 70.0 in | Wt 187.1 lb

## 2021-09-12 DIAGNOSIS — I10 Essential (primary) hypertension: Secondary | ICD-10-CM

## 2021-09-12 DIAGNOSIS — E782 Mixed hyperlipidemia: Secondary | ICD-10-CM | POA: Diagnosis not present

## 2021-09-12 DIAGNOSIS — I6529 Occlusion and stenosis of unspecified carotid artery: Secondary | ICD-10-CM

## 2021-09-12 DIAGNOSIS — Z905 Acquired absence of kidney: Secondary | ICD-10-CM | POA: Diagnosis not present

## 2021-09-12 MED ORDER — VALSARTAN 40 MG PO TABS
40.0000 mg | ORAL_TABLET | Freq: Every day | ORAL | 3 refills | Status: DC
  Filled 2021-09-12: qty 90, 90d supply, fill #0

## 2021-09-12 MED ORDER — VALSARTAN 40 MG PO TABS
40.0000 mg | ORAL_TABLET | Freq: Every day | ORAL | 3 refills | Status: DC
  Filled 2021-09-12: qty 90, 90d supply, fill #0
  Filled 2021-12-01: qty 90, 90d supply, fill #1
  Filled 2021-12-06: qty 30, 30d supply, fill #0
  Filled 2021-12-06: qty 60, 60d supply, fill #0
  Filled 2022-03-06: qty 90, 90d supply, fill #1
  Filled 2022-06-11: qty 90, 90d supply, fill #2

## 2021-09-12 NOTE — Progress Notes (Signed)
Cardiology Office Note:    Date:  09/12/2021   ID:  Bobby Gonzalez, DOB 1966/05/27, MRN [id number]  PCP:  Carollee Herter, Alferd Apa, DO  Cardiologist:  Jenean Lindau, MD   Referring MD: Carollee Herter, Alferd Apa, *    ASSESSMENT:    1. Carotid atherosclerosis, unspecified laterality   2. Essential hypertension   3. Solitary kidney, acquired   4. Mixed hyperlipidemia    PLAN:    In order of problems listed above:  Atherosclerotic vascular disease: Secondary prevention stressed with the patient.  Importance of compliance with diet and medication stressed any vocalized understanding.  He was advised to walk at least half an hour a day 5 days a week and he promises to do so. Essential hypertension: Blood pressure is elevated and I have added valsartan to his regimen he will keep a track of his blood pressures and bring it back to me in 8 to 10 days.  At that time we will have complete blood work including fasting lipids. Mixed dyslipidemia: On lipid-lowering medications.  Diet emphasized.  We will check blood work in the next few days. Patient will be seen in follow-up appointment in 6 months or earlier if the patient has any concerns    Medication Adjustments/Labs and Tests Ordered: Current medicines are reviewed at length with the patient today.  Concerns regarding medicines are outlined above.  No orders of the defined types were placed in this encounter.  No orders of the defined types were placed in this encounter.    No chief complaint on file.    History of Present Illness:    Bobby Gonzalez is a 56 y.o. male.  Patient has past medical history of carotid atherosclerosis, essential hypertension dyslipidemia and solitary kidney.  He denies any problems at this time and takes care of activities of daily living.  No chest pain orthopnea or PND.  He mentions to me that his blood pressure is elevated at home but is similar to the blood pressure here.  This has been noticing over  the past several weeks.  No chest pain orthopnea or PND.  At the time of my evaluation, the patient is alert awake oriented and in no distress.  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 radiation and chemo 05-13-12(Dr. Lamonte Sakai)   Cardiac murmur 09/29/2020   Carotid atherosclerosis 03/01/2021   Chronic kidney disease    right renal mass 7.3x6.7x6.8cm renal cell ca   Dehydration 05/13/2012   Difficult intubation    POSSIBLE DIFFICULT INTUBATION--S/P RADIATION FOR CANCER LEFT TONSIL--EATING BUT THROAT STILL SORE AND PT HAS THICK MUCUS   Diverticula of colon 03/10/12   multiple rectosigmoid colonic diverticula/ ct abd/pelvis   Dizziness 11/13/2020   Dry mouth 05/16/2014   Essential hypertension 10/11/2019   Gastro-esophageal reflux disease without esophagitis    GERD (gastroesophageal reflux disease)    Hearing loss of right ear 05/29/2020   History of cancer tonsil 09/20/2016   History of head and neck radiation 09/20/2016   History of radiation therapy 03/30/12-05/15/12   left tonsil   HTN (hypertension) 06-25-12   at first start of chemo for squamous cell cancer of neck-left   Hyperlipidemia 05/21/2021   Hypothyroid 02/15/2013   Inguinal hernia 10/23/2012   Malignant tumor of kidney ([place]) 09/27/2020  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[place]) 09/27/2020   Multiple lung nodules on CT 05/16/2014   Pain    JOINT PAINS UPPER BODY AFTER RADIATION TREATMENTS   Personal history of malignant neoplasm of unspecified site of lip, oral cavity, and pharynx 09/27/2020   Personal history of other malignant neoplasm of kidney 09/27/2020   Phlegm in throat 05/13/2012   Preventative health care 05/16/2014   Preventive measure 05/16/2014   Renal cell carcinoma ([place]) 09/09/2012   Renal mass 03/30/2012   Sensorineural hearing loss, bilateral 09/20/2016   Solitary kidney,  acquired 09/29/2020   Solitary pulmonary nodule 09/27/2020   Syncope 09/29/2020   Tinnitus aurium, right 05/29/2020   Tonsil cancer ([place]) 02/2012   SCCa of Left tonsil-S/P biopsy on 02/17/12.    Past Surgical History:  Procedure Laterality Date   DIRECT LARYNGOSCOPY     S/P DL, Biopsy-Dr. Constance Holster. Pathology postive for SCCa of Left Tonsil   GASTROSTOMY TUBE PLACEMENT  03/02/12   INGUINAL HERNIA REPAIR Left 11/04/2012   Procedure: HERNIA REPAIR INGUINAL ADULT;  Surgeon: Joyice Faster. Cornett, MD;  Location: [facility];  Service: General;  Laterality: Left;   INSERTION OF MESH Left 11/04/2012   Procedure: INSERTION OF MESH;  Surgeon: Joyice Faster. Cornett, MD;  Location: [facility];  Service: General;  Laterality: Left;   LAPAROSCOPIC NEPHRECTOMY  06/29/2012   Procedure: LAPAROSCOPIC NEPHRECTOMY;  Surgeon: Dutch Gray, MD;  Location: [facility];  Service: Urology;  Laterality: Right;   MULTIPLE TOOTH EXTRACTIONS  03/04/12   with DR. Mohorn   PEG TUBE PLACEMENT  06-25-12   remains in place abdomen-not using at present.   PEG TUBE REMOVAL  10/2012   tonsil biopsy  02/17/12   SCCa left tomnsil,HPV positive,spread to l cervical node   WISDOM TOOTH EXTRACTION      Current Medications: Current Meds  Medication Sig   aspirin EC 81 MG tablet Take 81 mg by mouth daily.   cetirizine (ZYRTEC) 10 MG tablet Take 10 mg by mouth daily.   levothyroxine (SYNTHROID) 88 MCG tablet Take 88 mcg by mouth daily before breakfast.   Multiple Vitamin (MULTIVITAMIN) tablet Take 1 tablet by mouth daily.   pilocarpine (SALAGEN) 5 MG tablet Take 1 tablet by mouth 3 (three) times daily.   rosuvastatin (CRESTOR) 10 MG tablet Take 1 tablet (10 mg total) by mouth daily.     Allergies:   Banana and Penicillins   Social History   Socioeconomic History   Marital status: Married    Spouse name: Not on file   Number of children: 2   Years of education: Not on file   Highest education level: Associate degree: academic program  Occupational  History    Employer: [employer]    Comment: Immunologist  Tobacco Use   Smoking status: Never   Smokeless tobacco: Never  Vaping Use   Vaping Use: Never used  Substance and Sexual Activity   Alcohol use: No    Comment: Never drank alocohol.   Drug use: No   Sexual activity: Yes  Other Topics Concern   Not on file  Social History Narrative   The patient is married and has 2 daughters.Patient has never been a smoker.Patient denies use of smokeless tobacco.Patient has never drank alcohol.   Right handed   Caffeine: no coffee or soda. Tea in the weekend, drinks mainly flavored water due to only having 1 kidney   Social Determinants of Health   Financial Resource Strain: Not on file  Food Insecurity: Not on file  Transportation Needs: Not on file  Physical Activity: Not on file  Stress: Not on file  Social Connections: Not on file     Family History: The patient's family history includes Cancer in his father; Hypertension in his brother and father; Stroke in his father; Throat cancer in his brother; Thyroid disease in his brother. There is no history of Colon cancer, Esophageal cancer, Rectal cancer, or Stomach cancer.  ROS:   Please see the history of present illness.    All other systems reviewed and are negative.  EKGs/Labs/Other Studies Reviewed:    The following studies were reviewed today: I discussed my findings with the patient at length.   Recent Labs: 11/13/2020: BUN 15; Creatinine, Ser 1.13; Hemoglobin 15.9; Platelets 198.0; Potassium 4.4; Sodium 139 03/01/2021: ALT 17 05/21/2021: TSH 1.40  Recent Lipid Panel    Component Value Date/Time   CHOL 147 03/01/2021 0848   TRIG 47 03/01/2021 0848   HDL 61 03/01/2021 0848   CHOLHDL 2.4 03/01/2021 0848   CHOLHDL 3 11/13/2020 0933   VLDL 11.2 11/13/2020 0933   LDLCALC 76 03/01/2021 0848   LDLCALC 61 05/15/2020 0929    Physical Exam:    VS:  BP (!) 152/76    Pulse 60    Ht 5\' 10"  (1.778 m)    Wt 187 lb  1.3 oz (84.9 kg)    SpO2 99%    BMI 26.84 kg/m     Wt Readings from Last 3 Encounters:  09/12/21 187 lb 1.3 oz (84.9 kg)  09/05/21 188 lb (85.3 kg)  05/21/21 175 lb 3.2 oz (79.5 kg)     GEN: Patient is in no acute distress HEENT: Normal NECK: No JVD; No carotid bruits LYMPHATICS: No lymphadenopathy CARDIAC: Hear sounds regular, 2/6 systolic murmur at the apex. RESPIRATORY:  Clear to auscultation without rales, wheezing or rhonchi  ABDOMEN: Soft, non-tender, non-distended MUSCULOSKELETAL:  No edema; No deformity  SKIN: Warm and dry NEUROLOGIC:  Alert and oriented x 3 PSYCHIATRIC:  Normal affect   Signed, Jenean Lindau, MD  09/12/2021 10:48 AM    [place]

## 2021-09-12 NOTE — Patient Instructions (Addendum)
Medication Instructions:  Your physician has recommended you make the following change in your medication:   Start Valsartan   *If you need a refill on your cardiac medications before your next appointment, please call your pharmacy*   Lab Work: Your physician recommends that you return for lab work in: 2 weeks You need to have labs done when you are fasting.  You can come Monday through Friday 8:30 am to 12:00 pm and 1:15 to 4:30. You do not need to make an appointment as the order has already been placed. The labs you are going to have done are BMET, LFT and Lipids.  If you have labs (blood work) drawn today and your tests are completely normal, you will receive your results only by: [place] (if you have MyChart) OR A paper copy in the mail If you have any lab test that is abnormal or we need to change your treatment, we will call you to review the results.   Testing/Procedures: None ordered   Follow-Up: At [facility], you and your health needs are our priority.  As part of our continuing mission to provide you with exceptional heart care, we have created designated Provider Care Teams.  These Care Teams include your primary Cardiologist (physician) and Advanced Practice Providers (APPs -  Physician Assistants and Nurse Practitioners) who all work together to provide you with the care you need, when you need it.  We recommend signing up for the patient portal called "MyChart".  Sign up information is provided on this After Visit Summary.  MyChart is used to connect with patients for Virtual Visits (Telemedicine).  Patients are able to view lab/test results, encounter notes, upcoming appointments, etc.  Non-urgent messages can be sent to your provider as well.   To learn more about what you can do with MyChart, go to [email or web address].    Your next appointment:   9 month(s)  The format for your next appointment:   In Person  Provider:   Jyl Heinz,  MD   Other Instructions   Blood Pressure Record Sheet To take your blood pressure, you will need a blood pressure machine. You can buy a blood pressure machine (blood pressure monitor) at your clinic, drug store, or online. When choosing one, consider: An automatic monitor that has an arm cuff. A cuff that wraps snugly around your upper arm. You should be able to fit only one finger between your arm and the cuff. A device that stores blood pressure reading results. Do not choose a monitor that measures your blood pressure from your wrist or finger. Follow your health care provider's instructions for how to take your blood pressure. To use this form: Get one reading in the morning (a.m.) 1-2 hours after you take any medicines. Get one reading in the evening (p.m.) before supper. Take at least 2 readings with each blood pressure check. This makes sure the results are correct. Wait 1-2 minutes between measurements. Write down the results in the spaces on this form. Repeat this once a week, or as told by your health care provider.  Make a follow-up appointment with your health care provider to discuss the results. Blood pressure log Date: _______________________ a.m. _____________________(1st reading) HR___________            p.m. _____________________(2nd reading) HR__________  Date: _______________________ a.m. _____________________(1st reading) HR___________            p.m. _____________________(2nd reading) HR__________ Date: _______________________ a.m. _____________________(1st reading) HR___________  p.m. _____________________(2nd reading) HR__________ Date: _______________________ a.m. _____________________(1st reading) HR___________            p.m. _____________________(2nd reading) HR__________  Date: _______________________ a.m. _____________________(1st reading) HR___________            p.m. _____________________(2nd reading) HR__________  Date:  _______________________ a.m. _____________________(1st reading) HR___________            p.m. _____________________(2nd reading) HR__________  Date: _______________________ a.m. _____________________(1st reading) HR___________            p.m. _____________________(2nd reading) HR__________   This information is not intended to replace advice given to you by your health care provider. Make sure you discuss any questions you have with your health care provider. Document Revised: 12/08/2019 Document Reviewed: 12/08/2019 Elsevier Patient Education  2021 [employer].

## 2021-09-16 ENCOUNTER — Encounter: Payer: Self-pay | Admitting: Cardiology

## 2021-09-28 ENCOUNTER — Other Ambulatory Visit (HOSPITAL_COMMUNITY): Payer: Self-pay

## 2021-09-29 ENCOUNTER — Other Ambulatory Visit (HOSPITAL_COMMUNITY): Payer: Self-pay

## 2021-10-05 LAB — BASIC METABOLIC PANEL
BUN/Creatinine Ratio: 14 (ref 9–20)
BUN: 19 mg/dL (ref 6–24)
CO2: 27 mmol/L (ref 20–29)
Calcium: 9.6 mg/dL (ref 8.7–10.2)
Chloride: 101 mmol/L (ref 96–106)
Creatinine, Ser: 1.38 mg/dL — ABNORMAL HIGH (ref 0.76–1.27)
Glucose: 93 mg/dL (ref 70–99)
Potassium: 4.7 mmol/L (ref 3.5–5.2)
Sodium: 138 mmol/L (ref 134–144)
eGFR: 60 mL/min/{1.73_m2} (ref >59)

## 2021-10-05 LAB — LIPID PANEL
Chol/HDL Ratio: 2.2 ratio (ref 0.0–5.0)
Cholesterol, Total: 105 mg/dL (ref 100–199)
HDL: 48 mg/dL (ref >39)
LDL Chol Calc (NIH): 45 mg/dL (ref 0–99)
Triglycerides: 47 mg/dL (ref 0–149)
VLDL Cholesterol Cal: 12 mg/dL (ref 5–40)

## 2021-10-05 LAB — HEPATIC FUNCTION PANEL
ALT: 20 IU/L (ref 0–44)
AST: 20 IU/L (ref 0–40)
Albumin: 4.5 g/dL (ref 3.8–4.9)
Alkaline Phosphatase: 64 IU/L (ref 44–121)
Bilirubin Total: 0.6 mg/dL (ref 0.0–1.2)
Bilirubin, Direct: 0.19 mg/dL (ref 0.00–0.40)
Total Protein: 6.7 g/dL (ref 6.0–8.5)

## 2021-10-05 NOTE — Addendum Note (Signed)
Addended by: Truddie Hidden on: 10/05/2021 11:19 AM   Modules accepted: Orders

## 2021-10-26 ENCOUNTER — Other Ambulatory Visit (HOSPITAL_COMMUNITY): Payer: Self-pay

## 2021-10-26 ENCOUNTER — Telehealth: Payer: Self-pay | Admitting: Family Medicine

## 2021-10-26 ENCOUNTER — Other Ambulatory Visit: Payer: Self-pay | Admitting: Family Medicine

## 2021-10-26 MED ORDER — LEVOTHYROXINE SODIUM 88 MCG PO TABS
ORAL_TABLET | Freq: Every day | ORAL | 2 refills | Status: DC
  Filled 2021-10-26: qty 30, fill #0

## 2021-10-26 MED ORDER — LEVOTHYROXINE SODIUM 88 MCG PO TABS: 88.0000 ug | ORAL_TABLET | Freq: Every day | ORAL | 0 refills | Status: DC

## 2021-10-26 NOTE — Telephone Encounter (Signed)
Refill sent.

## 2021-10-26 NOTE — Telephone Encounter (Signed)
[place] is at the beach and forget medication. He is hoping to get a 3 day supply.   Medication: levothyroxine (SYNTHROID) 88 MCG tablet   Has the patient contacted their pharmacy? Yes.     Preferred Pharmacy: [facility], [place], [place] [address] [phone]

## 2021-11-19 ENCOUNTER — Other Ambulatory Visit (HOSPITAL_BASED_OUTPATIENT_CLINIC_OR_DEPARTMENT_OTHER): Payer: Self-pay

## 2021-11-19 ENCOUNTER — Ambulatory Visit (INDEPENDENT_AMBULATORY_CARE_PROVIDER_SITE_OTHER): Payer: No Typology Code available for payment source | Admitting: Family Medicine

## 2021-11-19 ENCOUNTER — Encounter: Payer: Self-pay | Admitting: Family Medicine

## 2021-11-19 ENCOUNTER — Other Ambulatory Visit (HOSPITAL_COMMUNITY): Payer: Self-pay

## 2021-11-19 VITALS — BP 130/82 | HR 61 | Temp 97.8°F | Resp 16 | Ht 70.0 in | Wt 185.4 lb

## 2021-11-19 DIAGNOSIS — Z905 Acquired absence of kidney: Secondary | ICD-10-CM

## 2021-11-19 DIAGNOSIS — I6529 Occlusion and stenosis of unspecified carotid artery: Secondary | ICD-10-CM | POA: Diagnosis not present

## 2021-11-19 DIAGNOSIS — N183 Chronic kidney disease, stage 3 unspecified: Secondary | ICD-10-CM

## 2021-11-19 DIAGNOSIS — E039 Hypothyroidism, unspecified: Secondary | ICD-10-CM

## 2021-11-19 DIAGNOSIS — C641 Malignant neoplasm of right kidney, except renal pelvis: Secondary | ICD-10-CM

## 2021-11-19 DIAGNOSIS — Z0000 Encounter for general adult medical examination without abnormal findings: Secondary | ICD-10-CM

## 2021-11-19 DIAGNOSIS — I10 Essential (primary) hypertension: Secondary | ICD-10-CM

## 2021-11-19 DIAGNOSIS — C099 Malignant neoplasm of tonsil, unspecified: Secondary | ICD-10-CM | POA: Diagnosis not present

## 2021-11-19 DIAGNOSIS — Z0001 Encounter for general adult medical examination with abnormal findings: Secondary | ICD-10-CM | POA: Diagnosis not present

## 2021-11-19 DIAGNOSIS — E782 Mixed hyperlipidemia: Secondary | ICD-10-CM

## 2021-11-19 LAB — COMPREHENSIVE METABOLIC PANEL
ALT: 22 U/L (ref 0–53)
AST: 29 U/L (ref 0–37)
Albumin: 4.5 g/dL (ref 3.5–5.2)
Alkaline Phosphatase: 53 U/L (ref 39–117)
BUN: 16 mg/dL (ref 6–23)
CO2: 30 mEq/L (ref 19–32)
Calcium: 9.4 mg/dL (ref 8.4–10.5)
Chloride: 104 mEq/L (ref 96–112)
Creatinine, Ser: 1.19 mg/dL (ref 0.40–1.50)
GFR: 68.95 mL/min (ref >60.00)
Glucose, Bld: 98 mg/dL (ref 70–99)
Potassium: 4.4 mEq/L (ref 3.5–5.1)
Sodium: 139 mEq/L (ref 135–145)
Total Bilirubin: 1 mg/dL (ref 0.2–1.2)
Total Protein: 6.4 g/dL (ref 6.0–8.3)

## 2021-11-19 LAB — CBC WITH DIFFERENTIAL/PLATELET
Basophils Absolute: 0 10*3/uL (ref 0.0–0.1)
Basophils Relative: 0.4 % (ref 0.0–3.0)
Eosinophils Absolute: 0.2 10*3/uL (ref 0.0–0.7)
Eosinophils Relative: 5.7 % — ABNORMAL HIGH (ref 0.0–5.0)
HCT: 46.2 % (ref 39.0–52.0)
Hemoglobin: 15.4 g/dL (ref 13.0–17.0)
Lymphocytes Relative: 19 % (ref 12.0–46.0)
Lymphs Abs: 0.7 10*3/uL (ref 0.7–4.0)
MCHC: 33.4 g/dL (ref 30.0–36.0)
MCV: 92 fl (ref 78.0–100.0)
Monocytes Absolute: 0.5 10*3/uL (ref 0.1–1.0)
Monocytes Relative: 14.1 % — ABNORMAL HIGH (ref 3.0–12.0)
Neutro Abs: 2.3 10*3/uL (ref 1.4–7.7)
Neutrophils Relative %: 60.8 % (ref 43.0–77.0)
Platelets: 193 10*3/uL (ref 150.0–400.0)
RBC: 5.02 Mil/uL (ref 4.22–5.81)
RDW: 13.1 % (ref 11.5–15.5)
WBC: 3.8 10*3/uL — ABNORMAL LOW (ref 4.0–10.5)

## 2021-11-19 LAB — TSH: TSH: 1.87 u[IU]/mL (ref 0.35–5.50)

## 2021-11-19 LAB — LIPID PANEL
Cholesterol: 94 mg/dL (ref 0–200)
HDL: 52.2 mg/dL (ref >39.00)
LDL Cholesterol: 34 mg/dL (ref 0–99)
NonHDL: 41.59
Total CHOL/HDL Ratio: 2
Triglycerides: 36 mg/dL (ref 0.0–149.0)
VLDL: 7.2 mg/dL (ref 0.0–40.0)

## 2021-11-19 LAB — PSA: PSA: 0.19 ng/mL (ref 0.10–4.00)

## 2021-11-19 MED ORDER — PILOCARPINE HCL 5 MG PO TABS
5.0000 mg | ORAL_TABLET | Freq: Three times a day (TID) | ORAL | 3 refills | Status: DC
  Filled 2021-11-19 – 2022-01-03 (×2): qty 90, 30d supply, fill #0
  Filled 2022-02-20: qty 90, 30d supply, fill #1
  Filled 2022-05-03 (×2): qty 90, 30d supply, fill #2

## 2021-11-19 MED ORDER — LEVOTHYROXINE SODIUM 88 MCG PO TABS
88.0000 ug | ORAL_TABLET | Freq: Every day | ORAL | 3 refills | Status: DC
  Filled 2021-11-19 – 2021-11-20 (×2): qty 90, 90d supply, fill #0
  Filled 2022-02-20: qty 90, 90d supply, fill #1
  Filled 2022-05-20: qty 90, 90d supply, fill #2
  Filled 2022-08-30 – 2022-08-31 (×3): qty 90, 90d supply, fill #3
  Filled ????-??-??: fill #0

## 2021-11-19 NOTE — Patient Instructions (Signed)

## 2021-11-19 NOTE — Assessment & Plan Note (Signed)
Check labs today  ?con't synthroid ?

## 2021-11-19 NOTE — Assessment & Plan Note (Signed)
Tolerating statin, encouraged heart healthy diet, avoid trans fats, minimize simple carbs and saturated fats. Increase exercise as tolerated 

## 2021-11-19 NOTE — Assessment & Plan Note (Signed)
Pt released from oncology ?

## 2021-11-19 NOTE — Assessment & Plan Note (Addendum)
ghm utd Check labs  See avs  

## 2021-11-19 NOTE — Assessment & Plan Note (Signed)
Well controlled, no changes to meds. Encouraged heart healthy diet such as the DASH diet and exercise as tolerated.  °

## 2021-11-19 NOTE — Assessment & Plan Note (Signed)
S/p treatment  ?Released from oncology ?

## 2021-11-19 NOTE — Progress Notes (Addendum)
? ?Subjective:  ? ?By signing my name below, I, Bobby Gonzalez, attest that this documentation has been prepared under the direction and in the presence of Bobby Schanz DO, 11/19/2021 ? ? ? Patient ID: Bobby Gonzalez, male    DOB: May 07, 1966, 56 y.o.   MRN: [id number] ? ?Chief Complaint  ?Patient presents with  ? Annual Exam  ?  Here for Annual Exam   ? ? ?HPI ?Patient is in today for a comprehensive physical exam. ? ?He takes 40 MG of Valsartan around 10 am - 44:92 pm. His systolic ranges between 010 - 160. His diastolic ranges 58 - 071 ?BP Readings from Last 3 Encounters:  ?11/19/21 130/82  ?09/12/21 (!) 152/76  ?09/05/21 (!) 141/84  ? ?He is requesting a refill of 5 MG of Pilocarpine and 88 MCG of Levothyroxine.  ? ?He denies having any fever, ear pain, new muscle pain, joint pain, new moles, congestion, sinus pain, sore throat, palpations, wheezing, n/v/d, constipation, blood in stool, dysuria, frequency, hematuria at this time. ? ?He denies changes to family history.  ?Last completed colonoscopy in 04/27/2018 ?Last completed PSA in 11/13/2020 ?He plans to receive the Shingles Vaccine.  ?He states that he hasn't been as active as usual. However, he is becoming more active and lost 7 lbs. ?Past Medical History:  ?Diagnosis Date  ? Acquired hypothyroidism 09/27/2020  ? Allergy   ? pcns  ? Cancer ([place]) 02/17/12 bx  ? 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 radiation and chemo 05-13-12(Dr. Lamonte Sakai)  ? Cardiac murmur 09/29/2020  ? Carotid atherosclerosis 03/01/2021  ? Chronic kidney disease   ? right renal mass 7.3x6.7x6.8cm renal cell ca  ? Dehydration 05/13/2012  ? Difficult intubation   ? POSSIBLE DIFFICULT INTUBATION--S/P RADIATION FOR CANCER LEFT TONSIL--EATING BUT THROAT STILL SORE AND PT HAS THICK MUCUS  ? Diverticula of colon 03/10/12  ? multiple rectosigmoid colonic diverticula/ ct abd/pelvis  ? Dizziness 11/13/2020  ? Dry mouth 05/16/2014  ? Essential hypertension 10/11/2019   ? Gastro-esophageal reflux disease without esophagitis   ? GERD (gastroesophageal reflux disease)   ? Hearing loss of right ear 05/29/2020  ? History of cancer tonsil 09/20/2016  ? History of head and neck radiation 09/20/2016  ? History of radiation therapy 03/30/12-05/15/12  ? left tonsil  ? HTN (hypertension) 06-25-12  ? at first start of chemo for squamous cell cancer of neck-left  ? Hyperlipidemia 05/21/2021  ? Hypothyroid 02/15/2013  ? Inguinal hernia 10/23/2012  ? Malignant tumor of kidney ([place]) 09/27/2020  ? 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[place]) 09/27/2020  ? Multiple lung nodules on CT 05/16/2014  ? Pain   ? JOINT PAINS UPPER BODY AFTER RADIATION TREATMENTS  ? Personal history of malignant neoplasm of unspecified site of lip, oral cavity, and pharynx 09/27/2020  ? Personal history of other malignant neoplasm of kidney 09/27/2020  ? Phlegm in throat 05/13/2012  ? Preventative health care 05/16/2014  ? Preventive measure 05/16/2014  ? Renal cell carcinoma ([place]) 09/09/2012  ? Renal mass 03/30/2012  ? Sensorineural hearing loss, bilateral 09/20/2016  ? Solitary kidney, acquired 09/29/2020  ? Solitary pulmonary nodule 09/27/2020  ? Syncope 09/29/2020  ? Tinnitus aurium, right 05/29/2020  ? Tonsil cancer ([place]) 02/2012  ? SCCa of Left tonsil-S/P biopsy on 02/17/12.  ? ? ?Past Surgical History:  ?Procedure Laterality Date  ? DIRECT LARYNGOSCOPY    ? S/P DL, Biopsy-Dr. Constance Holster. Pathology postive for SCCa of Left Tonsil  ? GASTROSTOMY  TUBE PLACEMENT  03/02/12  ? INGUINAL HERNIA REPAIR Left 11/04/2012  ? Procedure: HERNIA REPAIR INGUINAL ADULT;  Surgeon: Joyice Faster. Cornett, MD;  Location: [facility];  Service: General;  Laterality: Left;  ? INSERTION OF MESH Left 11/04/2012  ? Procedure: INSERTION OF MESH;  Surgeon: Joyice Faster. Cornett, MD;  Location: [facility];  Service: General;  Laterality: Left;  ? LAPAROSCOPIC NEPHRECTOMY  06/29/2012  ? Procedure: LAPAROSCOPIC NEPHRECTOMY;  Surgeon: Dutch Gray, MD;  Location: [facility];  Service: Urology;  Laterality: Right;  ?  MULTIPLE TOOTH EXTRACTIONS  03/04/12  ? with DR. Mohorn  ? PEG TUBE PLACEMENT  06-25-12  ? remains in place abdomen-not using at present.  ? PEG TUBE REMOVAL  10/2012  ? tonsil biopsy  02/17/12  ? SCCa left tomnsil,HPV positive,spread to l cervical node  ? WISDOM TOOTH EXTRACTION    ? ? ?Family History  ?Problem Relation Age of Onset  ? Stroke Father   ? Cancer Father   ?     Prostate  ? Hypertension Father   ? Thyroid disease Brother   ? Hypertension Brother   ? Throat cancer Brother   ? Colon cancer Neg Hx   ? Esophageal cancer Neg Hx   ? Rectal cancer Neg Hx   ? Stomach cancer Neg Hx   ? ? ?Social History  ? ?Socioeconomic History  ? Marital status: Married  ?  Spouse name: Not on file  ? Number of children: 2  ? Years of education: Not on file  ? Highest education level: Associate degree: academic program  ?Occupational History  ?  Employer: [employer]  ?  Comment: car saleman  ?Tobacco Use  ? Smoking status: Never  ? Smokeless tobacco: Never  ?Vaping Use  ? Vaping Use: Never used  ?Substance and Sexual Activity  ? Alcohol use: No  ?  Comment: Never drank alocohol.  ? Drug use: No  ? Sexual activity: Yes  ?Other Topics Concern  ? Not on file  ?Social History Narrative  ? The patient is married and has 2 daughters.Patient has never been a smoker.Patient denies use of smokeless tobacco.Patient has never drank alcohol.  ? Right handed  ? Caffeine: no coffee or soda. Tea in the weekend, drinks mainly flavored water due to only having 1 kidney  ? ?Social Determinants of Health  ? ?Financial Resource Strain: Not on file  ?Food Insecurity: Not on file  ?Transportation Needs: Not on file  ?Physical Activity: Not on file  ?Stress: Not on file  ?Social Connections: Not on file  ?Intimate Partner Violence: Not on file  ? ? ?Outpatient Medications Prior to Visit  ?Medication Sig Dispense Refill  ? aspirin EC 81 MG tablet Take 81 mg by mouth daily.    ? cetirizine (ZYRTEC) 10 MG tablet Take 10 mg by mouth daily.    ?  levothyroxine (SYNTHROID) 88 MCG tablet TAKE 1 TABLET BY MOUTH DAILY 30 tablet 2  ? Multiple Vitamin (MULTIVITAMIN) tablet Take 1 tablet by mouth daily.    ? rosuvastatin (CRESTOR) 10 MG tablet Take 1 tablet (10 mg total) by mouth daily. 90 tablet 3  ? valsartan (DIOVAN) 40 MG tablet Take 1 tablet (40 mg total) by mouth daily. 90 tablet 3  ? levothyroxine (SYNTHROID) 88 MCG tablet Take 1 tablet (88 mcg total) by mouth daily before breakfast. 30 tablet 0  ? pilocarpine (SALAGEN) 5 MG tablet Take 1 tablet by mouth 3 (three) times daily. 90 tablet 2  ? ?  No facility-administered medications prior to visit.  ? ? ?Allergies  ?Allergen Reactions  ? Banana   ? Penicillins   ? ? ?Review of Systems  ?Constitutional:  Negative for fever.  ?HENT:  Negative for congestion, ear pain, sinus pain and sore throat.   ?Respiratory:  Negative for wheezing.   ?Cardiovascular:  Negative for palpitations.  ?Gastrointestinal:  Negative for blood in stool, constipation, diarrhea, nausea and vomiting.  ?Genitourinary:  Negative for dysuria, frequency and hematuria.  ?Musculoskeletal:  Negative for joint pain and myalgias.  ?Skin:   ?     (-) [place]  ? ?   ?Objective:  ?  ?Physical Exam ?Constitutional:   ?   General: He is not in acute distress. ?   Appearance: Normal appearance. He is not ill-appearing.  ?HENT:  ?   Head: Normocephalic and atraumatic.  ?   Right Ear: Tympanic membrane, ear canal and external ear normal.  ?   Left Ear: Tympanic membrane, ear canal and external ear normal.  ?Eyes:  ?   Extraocular Movements: Extraocular movements intact.  ?   Pupils: Pupils are equal, round, and reactive to light.  ?Cardiovascular:  ?   Rate and Rhythm: Normal rate and regular rhythm.  ?   Heart sounds: Normal heart sounds. No murmur heard. ?  No gallop.  ?Pulmonary:  ?   Effort: Pulmonary effort is normal. No respiratory distress.  ?   Breath sounds: Normal breath sounds. No wheezing or rales.  ?Abdominal:  ?   General: Bowel sounds are  normal. There is no distension.  ?   Palpations: Abdomen is soft.  ?   Tenderness: There is no abdominal tenderness. There is no guarding.  ?Skin: ?   General: Skin is warm and dry.  ?Neurological:  ?   Me

## 2021-11-19 NOTE — Assessment & Plan Note (Signed)
Cont statin

## 2021-11-19 NOTE — Assessment & Plan Note (Signed)
Per nephrology 

## 2021-11-20 ENCOUNTER — Other Ambulatory Visit (HOSPITAL_COMMUNITY): Payer: Self-pay

## 2021-11-21 ENCOUNTER — Other Ambulatory Visit (HOSPITAL_COMMUNITY): Payer: Self-pay

## 2021-11-26 ENCOUNTER — Other Ambulatory Visit (HOSPITAL_BASED_OUTPATIENT_CLINIC_OR_DEPARTMENT_OTHER): Payer: Self-pay

## 2021-11-28 ENCOUNTER — Other Ambulatory Visit: Payer: Self-pay | Admitting: Family Medicine

## 2021-11-28 DIAGNOSIS — D729 Disorder of white blood cells, unspecified: Secondary | ICD-10-CM

## 2021-12-03 ENCOUNTER — Other Ambulatory Visit (HOSPITAL_BASED_OUTPATIENT_CLINIC_OR_DEPARTMENT_OTHER): Payer: Self-pay

## 2021-12-06 ENCOUNTER — Other Ambulatory Visit (HOSPITAL_COMMUNITY): Payer: Self-pay

## 2021-12-06 ENCOUNTER — Other Ambulatory Visit (HOSPITAL_BASED_OUTPATIENT_CLINIC_OR_DEPARTMENT_OTHER): Payer: Self-pay

## 2021-12-07 ENCOUNTER — Other Ambulatory Visit (HOSPITAL_COMMUNITY): Payer: Self-pay

## 2022-01-03 ENCOUNTER — Other Ambulatory Visit (HOSPITAL_COMMUNITY): Payer: Self-pay

## 2022-01-18 DIAGNOSIS — C269 Malignant neoplasm of ill-defined sites within the digestive system: Secondary | ICD-10-CM

## 2022-01-18 HISTORY — DX: Malignant neoplasm of ill-defined sites within the digestive system: C26.9

## 2022-02-04 ENCOUNTER — Other Ambulatory Visit (INDEPENDENT_AMBULATORY_CARE_PROVIDER_SITE_OTHER): Payer: No Typology Code available for payment source

## 2022-02-04 ENCOUNTER — Encounter: Payer: Self-pay | Admitting: Family Medicine

## 2022-02-04 DIAGNOSIS — D729 Disorder of white blood cells, unspecified: Secondary | ICD-10-CM | POA: Diagnosis not present

## 2022-02-04 LAB — CBC WITH DIFFERENTIAL/PLATELET
Basophils Absolute: 0 10*3/uL (ref 0.0–0.1)
Basophils Relative: 0.2 % (ref 0.0–3.0)
Eosinophils Absolute: 0.2 10*3/uL (ref 0.0–0.7)
Eosinophils Relative: 5.7 % — ABNORMAL HIGH (ref 0.0–5.0)
HCT: 42.8 % (ref 39.0–52.0)
Hemoglobin: 14.3 g/dL (ref 13.0–17.0)
Lymphocytes Relative: 17.9 % (ref 12.0–46.0)
Lymphs Abs: 0.7 10*3/uL (ref 0.7–4.0)
MCHC: 33.5 g/dL (ref 30.0–36.0)
MCV: 91.2 fl (ref 78.0–100.0)
Monocytes Absolute: 0.5 10*3/uL (ref 0.1–1.0)
Monocytes Relative: 12.5 % — ABNORMAL HIGH (ref 3.0–12.0)
Neutro Abs: 2.3 10*3/uL (ref 1.4–7.7)
Neutrophils Relative %: 63.7 % (ref 43.0–77.0)
Platelets: 164 10*3/uL (ref 150.0–400.0)
RBC: 4.69 Mil/uL (ref 4.22–5.81)
RDW: 13.3 % (ref 11.5–15.5)
WBC: 3.7 10*3/uL — ABNORMAL LOW (ref 4.0–10.5)

## 2022-02-05 ENCOUNTER — Other Ambulatory Visit: Payer: Self-pay | Admitting: Family Medicine

## 2022-02-05 DIAGNOSIS — R5383 Other fatigue: Secondary | ICD-10-CM

## 2022-02-20 ENCOUNTER — Other Ambulatory Visit (HOSPITAL_COMMUNITY): Payer: Self-pay

## 2022-02-21 ENCOUNTER — Other Ambulatory Visit (HOSPITAL_COMMUNITY): Payer: Self-pay

## 2022-02-28 ENCOUNTER — Other Ambulatory Visit (HOSPITAL_COMMUNITY): Payer: Self-pay

## 2022-03-04 ENCOUNTER — Ambulatory Visit (INDEPENDENT_AMBULATORY_CARE_PROVIDER_SITE_OTHER): Payer: No Typology Code available for payment source | Admitting: Family Medicine

## 2022-03-04 ENCOUNTER — Encounter: Payer: Self-pay | Admitting: Family Medicine

## 2022-03-04 ENCOUNTER — Other Ambulatory Visit (HOSPITAL_COMMUNITY): Payer: Self-pay

## 2022-03-04 VITALS — BP 140/80 | HR 88 | Temp 98.8°F | Resp 18 | Ht 70.0 in | Wt 183.0 lb

## 2022-03-04 DIAGNOSIS — J069 Acute upper respiratory infection, unspecified: Secondary | ICD-10-CM

## 2022-03-04 DIAGNOSIS — R5383 Other fatigue: Secondary | ICD-10-CM

## 2022-03-04 HISTORY — DX: Acute upper respiratory infection, unspecified: J06.9

## 2022-03-04 HISTORY — DX: Other fatigue: R53.83

## 2022-03-04 LAB — CBC WITH DIFFERENTIAL/PLATELET
Basophils Absolute: 0 10*3/uL (ref 0.0–0.1)
Basophils Relative: 0.1 % (ref 0.0–3.0)
Eosinophils Absolute: 0 10*3/uL (ref 0.0–0.7)
Eosinophils Relative: 0.4 % (ref 0.0–5.0)
HCT: 45 % (ref 39.0–52.0)
Hemoglobin: 15 g/dL (ref 13.0–17.0)
Lymphocytes Relative: 3.9 % — ABNORMAL LOW (ref 12.0–46.0)
Lymphs Abs: 0.3 10*3/uL — ABNORMAL LOW (ref 0.7–4.0)
MCHC: 33.2 g/dL (ref 30.0–36.0)
MCV: 92.1 fl (ref 78.0–100.0)
Monocytes Absolute: 0.6 10*3/uL (ref 0.1–1.0)
Monocytes Relative: 9.7 % (ref 3.0–12.0)
Neutro Abs: 5.6 10*3/uL (ref 1.4–7.7)
Neutrophils Relative %: 85.9 % — ABNORMAL HIGH (ref 43.0–77.0)
Platelets: 150 10*3/uL (ref 150.0–400.0)
RBC: 4.89 Mil/uL (ref 4.22–5.81)
RDW: 13.7 % (ref 11.5–15.5)
WBC: 6.6 10*3/uL (ref 4.0–10.5)

## 2022-03-04 LAB — COMPREHENSIVE METABOLIC PANEL
ALT: 18 U/L (ref 0–53)
AST: 18 U/L (ref 0–37)
Albumin: 4.4 g/dL (ref 3.5–5.2)
Alkaline Phosphatase: 57 U/L (ref 39–117)
BUN: 14 mg/dL (ref 6–23)
CO2: 28 mEq/L (ref 19–32)
Calcium: 9.2 mg/dL (ref 8.4–10.5)
Chloride: 99 mEq/L (ref 96–112)
Creatinine, Ser: 1.23 mg/dL (ref 0.40–1.50)
GFR: 66.13 mL/min (ref >60.00)
Glucose, Bld: 196 mg/dL — ABNORMAL HIGH (ref 70–99)
Potassium: 4.3 mEq/L (ref 3.5–5.1)
Sodium: 135 mEq/L (ref 135–145)
Total Bilirubin: 0.7 mg/dL (ref 0.2–1.2)
Total Protein: 6.6 g/dL (ref 6.0–8.3)

## 2022-03-04 LAB — VITAMIN B12: Vitamin B-12: 197 pg/mL — ABNORMAL LOW (ref 211–911)

## 2022-03-04 LAB — TSH: TSH: 1.58 u[IU]/mL (ref 0.35–5.50)

## 2022-03-04 LAB — VITAMIN D 25 HYDROXY (VIT D DEFICIENCY, FRACTURES): VITD: 40.14 ng/mL (ref 30.00–100.00)

## 2022-03-04 MED ORDER — AZELASTINE HCL 0.1 % NA SOLN
1.0000 | Freq: Two times a day (BID) | NASAL | 12 refills | Status: DC
  Filled 2022-03-04: qty 30, 50d supply, fill #0

## 2022-03-04 MED ORDER — AZITHROMYCIN 250 MG PO TABS: ORAL_TABLET | ORAL | 0 refills | Status: DC

## 2022-03-04 NOTE — Progress Notes (Addendum)
Subjective:   By signing my name below, I, Bobby Gonzalez, attest that this documentation has been prepared under the direction and in the presence of Bobby Held DO 03/04/2022    Patient ID: Bobby Gonzalez, male    DOB: [birth year], 56 y.o.   MRN: [id number]  Chief Complaint  Patient presents with   Sinus Problem    Pt states waking up with sxs this morning. Pt states feels like a balloon in his head. Pt states having fatigue.     HPI Patient is in today for an office visit  He complains of sinus pressure that appeared around 03:00 on 03/04/2022. He also has associated symptoms of fatigue,cough and elevated blood pressure. He reports that the mucus from his nose is clear. He also  reports that he was at the beach last week and returned on 03/02/2022. He regularly take 10 Mg of Zyrtec. He denies of any fever but notes that he does feel hot. The family members in his household are currently not sick.  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 radiation and chemo 05-13-12(Dr. Lamonte Gonzalez)   Cardiac murmur 09/29/2020   Carotid atherosclerosis 03/01/2021   Chronic kidney disease    right renal mass 7.3x6.7x6.8cm renal cell ca   Dehydration 05/13/2012   Difficult intubation    POSSIBLE DIFFICULT INTUBATION--S/P RADIATION FOR CANCER LEFT TONSIL--EATING BUT THROAT STILL SORE AND PT HAS THICK MUCUS   Diverticula of colon 03/10/12   multiple rectosigmoid colonic diverticula/ ct abd/pelvis   Dizziness 11/13/2020   Dry mouth 05/16/2014   Essential hypertension 10/11/2019   Gastro-esophageal reflux disease without esophagitis    GERD (gastroesophageal reflux  disease)    Hearing loss of right ear 05/29/2020   History of cancer tonsil 09/20/2016   History of head and neck radiation 09/20/2016   History of radiation therapy 03/30/12-05/15/12   left tonsil   HTN (hypertension) 06-25-12   at first start of chemo for squamous cell cancer of neck-left   Hyperlipidemia 05/21/2021   Hypothyroid 02/15/2013   Inguinal hernia 10/23/2012   Malignant tumor of kidney ([place]) 09/27/2020   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[place]) 09/27/2020   Multiple lung nodules on CT 05/16/2014   Pain    JOINT PAINS UPPER BODY AFTER RADIATION TREATMENTS   Personal history of malignant neoplasm of unspecified site of lip, oral cavity, and pharynx 09/27/2020   Personal history of other malignant neoplasm of kidney 09/27/2020   Phlegm in throat 05/13/2012   Preventative health care 05/16/2014   Preventive measure 05/16/2014   Renal cell carcinoma ([place]) 09/09/2012   Renal mass 03/30/2012   Sensorineural hearing loss, bilateral 09/20/2016   Solitary kidney, acquired 09/29/2020   Solitary pulmonary nodule 09/27/2020   Syncope 09/29/2020   Tinnitus aurium, right 05/29/2020   Tonsil cancer ([place]) 02/2012   SCCa of Left tonsil-S/P biopsy on 02/17/12.    Past Surgical History:  Procedure Laterality Date   DIRECT LARYNGOSCOPY     S/P DL, Biopsy-Dr. Constance Gonzalez. Pathology postive for SCCa of Left Tonsil   GASTROSTOMY TUBE PLACEMENT  03/02/12   INGUINAL  HERNIA REPAIR Left 11/04/2012   Procedure: HERNIA REPAIR INGUINAL ADULT;  Surgeon: Bobby Faster. Cornett, MD;  Location: [facility];  Service: General;  Laterality: Left;   INSERTION OF MESH Left 11/04/2012   Procedure: INSERTION OF MESH;  Surgeon: Bobby Faster. Cornett, MD;  Location: [facility];  Service: General;  Laterality: Left;   LAPAROSCOPIC NEPHRECTOMY  06/29/2012   Procedure: LAPAROSCOPIC NEPHRECTOMY;  Surgeon: Bobby Gray, MD;  Location: [facility];  Service: Urology;  Laterality: Right;   MULTIPLE TOOTH EXTRACTIONS  03/04/12   with DR. Mohorn   PEG TUBE PLACEMENT  06-25-12    remains in place abdomen-not using at present.   PEG TUBE REMOVAL  10/2012   tonsil biopsy  02/17/12   SCCa left tomnsil,HPV positive,spread to l cervical node   WISDOM TOOTH EXTRACTION      Family History  Problem Relation Age of Onset   Stroke Father    Cancer Father        Prostate   Hypertension Father    Thyroid disease Brother    Hypertension Brother    Throat cancer Brother    Colon cancer Neg Hx    Esophageal cancer Neg Hx    Rectal cancer Neg Hx    Stomach cancer Neg Hx     Social History   Socioeconomic History   Marital status: Married    Spouse name: Not on file   Number of children: 2   Years of education: Not on file   Highest education level: Associate degree: academic program  Occupational History    Employer: [employer], maintenance    Comment: Immunologist  Tobacco Use   Smoking status: Never   Smokeless tobacco: Never  Vaping Use   Vaping Use: Never used  Substance and Sexual Activity   Alcohol use: No    Comment: Never drank alocohol.   Drug use: No   Sexual activity: Yes  Other Topics Concern   Not on file  Social History Narrative   The patient is married and has 2 daughters.Patient has never been a smoker.Patient denies use of smokeless tobacco.Patient has never drank alcohol.   Right handed   Caffeine: no coffee or soda. Tea in the weekend, drinks mainly flavored water due to only having 1 kidney   Social Determinants of Health   Financial Resource Strain: Not on file  Food Insecurity: Not on file  Transportation Needs: Not on file  Physical Activity: Not on file  Stress: Not on file  Social Connections: Not on file  Intimate Partner Violence: Not on file    Outpatient Medications Prior to Visit  Medication Sig Dispense Refill   aspirin EC 81 MG tablet Take 81 mg by mouth daily.     cetirizine (ZYRTEC) 10 MG tablet Take 10 mg by mouth daily.     levothyroxine (SYNTHROID) 88 MCG tablet TAKE 1 TABLET BY MOUTH DAILY 30 tablet 2    levothyroxine (SYNTHROID) 88 MCG tablet Take 1 tablet (88 mcg total) by mouth daily before breakfast. 90 tablet 3   Multiple Vitamin (MULTIVITAMIN) tablet Take 1 tablet by mouth daily.     pilocarpine (SALAGEN) 5 MG tablet Take 1 tablet by mouth 3 (three) times daily. 90 tablet 3   rosuvastatin (CRESTOR) 10 MG tablet Take 1 tablet (10 mg total) by mouth daily. 90 tablet 3   valsartan (DIOVAN) 40 MG tablet Take 1 tablet (40 mg total) by mouth daily. 90 tablet 3   No facility-administered medications prior to  visit.    Allergies  Allergen Reactions   Banana Shortness Of Breath   Penicillins     Review of Systems  Constitutional:  Positive for malaise/fatigue. Negative for fever.  HENT:  Positive for congestion and sinus pain. Negative for sore throat.        (+) Sinus Pressure  Eyes:  Negative for blurred vision.  Respiratory:  Positive for cough. Negative for shortness of breath.   Cardiovascular:  Negative for chest pain, palpitations and leg swelling.  Gastrointestinal:  Negative for abdominal pain, blood in stool and nausea.  Genitourinary:  Negative for dysuria and frequency.  Musculoskeletal:  Negative for falls.  Skin:  Negative for rash.  Neurological:  Negative for dizziness, loss of consciousness and headaches.  Endo/Heme/Allergies:  Negative for environmental allergies.  Psychiatric/Behavioral:  Negative for depression. The patient is not nervous/anxious.        Objective:    Physical Exam Vitals and nursing note reviewed.  Constitutional:      General: He is not in acute distress.    Appearance: Normal appearance. He is not ill-appearing.  HENT:     Head: Normocephalic and atraumatic.     Right Ear: Tympanic membrane, ear canal and external ear normal.     Left Ear: Tympanic membrane, ear canal and external ear normal.     Nose:     Right Sinus: Maxillary sinus tenderness and frontal sinus tenderness present.     Left Sinus: Maxillary sinus tenderness and  frontal sinus tenderness present.  Eyes:     Extraocular Movements: Extraocular movements intact.     Pupils: Pupils are equal, round, and reactive to light.  Cardiovascular:     Rate and Rhythm: Normal rate and regular rhythm.     Heart sounds: Normal heart sounds. No murmur heard.    No gallop.  Pulmonary:     Effort: Pulmonary effort is normal. No respiratory distress.     Breath sounds: Normal breath sounds. No wheezing or rales.  Skin:    General: Skin is warm and dry.  Neurological:     Mental Status: He is alert and oriented to person, place, and time.  Psychiatric:        Judgment: Judgment normal.     BP 140/80 (BP Location: Right Arm, Patient Position: Sitting, Cuff Size: Normal)   Pulse 88   Temp 98.8 F (37.1 C) (Oral)   Resp 18   Ht '5\' 10"'  (1.778 m)   Wt 183 lb (83 kg)   SpO2 98%   BMI 26.26 kg/m  Wt Readings from Last 3 Encounters:  03/04/22 183 lb (83 kg)  11/19/21 185 lb 6.4 oz (84.1 kg)  09/12/21 187 lb 1.3 oz (84.9 kg)    Diabetic Foot Exam - Simple   No data filed    Lab Results  Component Value Date   WBC 3.7 (L) 02/04/2022   HGB 14.3 02/04/2022   HCT 42.8 02/04/2022   PLT 164.0 02/04/2022   GLUCOSE 98 11/19/2021   CHOL 94 11/19/2021   TRIG 36.0 11/19/2021   HDL 52.20 11/19/2021   LDLCALC 34 11/19/2021   ALT 22 11/19/2021   AST 29 11/19/2021   NA 139 11/19/2021   K 4.4 11/19/2021   CL 104 11/19/2021   CREATININE 1.19 11/19/2021   BUN 16 11/19/2021   CO2 30 11/19/2021   TSH 1.87 11/19/2021   PSA 0.19 11/19/2021   INR 0.94 03/02/2012    Lab Results  Component Value Date  TSH 1.87 11/19/2021   Lab Results  Component Value Date   WBC 3.7 (L) 02/04/2022   HGB 14.3 02/04/2022   HCT 42.8 02/04/2022   MCV 91.2 02/04/2022   PLT 164.0 02/04/2022   Lab Results  Component Value Date   NA 139 11/19/2021   K 4.4 11/19/2021   CHLORIDE 106 02/10/2017   CO2 30 11/19/2021   GLUCOSE 98 11/19/2021   BUN 16 11/19/2021   CREATININE  1.19 11/19/2021   BILITOT 1.0 11/19/2021   ALKPHOS 53 11/19/2021   AST 29 11/19/2021   ALT 22 11/19/2021   PROT 6.4 11/19/2021   ALBUMIN 4.5 11/19/2021   CALCIUM 9.4 11/19/2021   ANIONGAP 7 02/11/2018   EGFR 60 10/04/2021   GFR 68.95 11/19/2021   Lab Results  Component Value Date   CHOL 94 11/19/2021   Lab Results  Component Value Date   HDL 52.20 11/19/2021   Lab Results  Component Value Date   LDLCALC 34 11/19/2021   Lab Results  Component Value Date   TRIG 36.0 11/19/2021   Lab Results  Component Value Date   CHOLHDL 2 11/19/2021   No results found for: "HGBA1C"     Assessment & Plan:   Problem List Items Addressed This Visit       Unprioritized   Viral upper respiratory tract infection    Use zyrtec and astelin and flonase  If no better in 1-2 weeks or if symptoms worsen --- can fill abx rx  F/u prn       Relevant Medications   azelastine (ASTELIN) 0.1 % nasal spray   azithromycin (ZITHROMAX Z-PAK) 250 MG tablet   Other fatigue - Primary   Relevant Orders   CBC with Differential/Platelet   Comprehensive metabolic panel   TSH   Vitamin B12   VITAMIN D 25 Hydroxy (Vit-D Deficiency, Fractures)     Meds ordered this encounter  Medications   azelastine (ASTELIN) 0.1 % nasal spray    Sig: Place 1 spray into both nostrils 2 (two) times daily. Use in each nostril as directed    Dispense:  30 mL    Refill:  12   azithromycin (ZITHROMAX Z-PAK) 250 MG tablet    Sig: As directed    Dispense:  6 each    Refill:  0    I, Bobby Held, DO, personally preformed the services described in this documentation.  All medical record entries made by the scribe were at my direction and in my presence.  I have reviewed the chart and discharge instructions (if applicable) and agree that the record reflects my personal performance and is accurate and complete. 03/04/2022   I,Amber Collins,acting as a scribe for Bobby Held, DO.,have documented all  relevant documentation on the behalf of Bobby Held, DO,as directed by  Bobby Held, DO while in the presence of Bobby Held, DO.   Bobby Held, DO

## 2022-03-04 NOTE — Patient Instructions (Addendum)
Use mucinex otc and you can add flonase nasal spray to if needed Con't zyrtec (certrizine) and I have sent astelin to the pharmacy Do not fill the antibiotic unless symptoms worsen     Upper Respiratory Infection, Adult An upper respiratory infection (URI) is a common viral infection of the nose, throat, and upper air passages that lead to the lungs. The most common type of URI is the common cold. URIs usually get better on their own, without medical treatment. What are the causes? A URI is caused by a virus. You may catch a virus by: Breathing in droplets from an infected person's cough or sneeze. Touching something that has been exposed to the virus (is contaminated) and then touching your mouth, nose, or eyes. What increases the risk? You are more likely to get a URI if: You are very young or very old. You have close contact with others, such as at work, school, or a health care facility. You smoke. You have long-term (chronic) heart or lung disease. You have a weakened disease-fighting system (immune system). You have nasal allergies or asthma. You are experiencing a lot of stress. You have poor nutrition. What are the signs or symptoms? A URI usually involves some of the following symptoms: Runny or stuffy (congested) nose. Cough. Sneezing. Sore throat. Headache. Fatigue. Fever. Loss of appetite. Pain in your forehead, behind your eyes, and over your cheekbones (sinus pain). Muscle aches. Redness or irritation of the eyes. Pressure in the ears or face. How is this diagnosed? This condition may be diagnosed based on your medical history and symptoms, and a physical exam. Your health care provider may use a swab to take a mucus sample from your nose (nasal swab). This sample can be tested to determine what virus is causing the illness. How is this treated? URIs usually get better on their own within 7-10 days. Medicines cannot cure URIs, but your health care provider may  recommend certain medicines to help relieve symptoms, such as: Over-the-counter cold medicines. Cough suppressants. Coughing is a type of defense against infection that helps to clear the respiratory system, so take these medicines only as recommended by your health care provider. Fever-reducing medicines. Follow these instructions at home: Activity Rest as needed. If you have a fever, stay home from work or school until your fever is gone or until your health care provider says your URI cannot spread to other people (is no longer contagious). Your health care provider may have you wear a face mask to prevent your infection from spreading. Relieving symptoms Gargle with a mixture of salt and water 3-4 times a day or as needed. To make salt water, completely dissolve -1 tsp (3-6 g) of salt in 1 cup (237 mL) of warm water. Use a cool-mist humidifier to add moisture to the air. This can help you breathe more easily. Eating and drinking  Drink enough fluid to keep your urine pale yellow. Eat soups and other clear broths. General instructions  Take over-the-counter and prescription medicines only as told by your health care provider. These include cold medicines, fever reducers, and cough suppressants. Do not use any products that contain nicotine or tobacco. These products include cigarettes, chewing tobacco, and vaping devices, such as e-cigarettes. If you need help quitting, ask your health care provider. Stay away from secondhand smoke. Stay up to date on all immunizations, including the yearly (annual) flu vaccine. Keep all follow-up visits. This is important. How to prevent the spread of infection to others  URIs can be contagious. To prevent the infection from spreading: Wash your hands with soap and water for at least 20 seconds. If soap and water are not available, use hand sanitizer. Avoid touching your mouth, face, eyes, or nose. Cough or sneeze into a tissue or your sleeve or elbow  instead of into your hand or into the air.  Contact a health care provider if: You are getting worse instead of better. You have a fever or chills. Your mucus is brown or red. You have yellow or brown discharge coming from your nose. You have pain in your face, especially when you bend forward. You have swollen neck glands. You have pain while swallowing. You have white areas in the back of your throat. Get help right away if: You have shortness of breath that gets worse. You have severe or persistent: Headache. Ear pain. Sinus pain. Chest pain. You have chronic lung disease along with any of the following: Making high-pitched whistling sounds when you breathe, most often when you breathe out (wheezing). Prolonged cough (more than 14 days). Coughing up blood. A change in your usual mucus. You have a stiff neck. You have changes in your: Vision. Hearing. Thinking. Mood. These symptoms may be an emergency. Get help right away. Call 911. Do not wait to see if the symptoms will go away. Do not drive yourself to the hospital. Summary An upper respiratory infection (URI) is a common infection of the nose, throat, and upper air passages that lead to the lungs. A URI is caused by a virus. URIs usually get better on their own within 7-10 days. Medicines cannot cure URIs, but your health care provider may recommend certain medicines to help relieve symptoms. This information is not intended to replace advice given to you by your health care provider. Make sure you discuss any questions you have with your health care provider. Document Revised: 03/21/2021 Document Reviewed: 03/21/2021 Elsevier Patient Education  [place].

## 2022-03-04 NOTE — Assessment & Plan Note (Signed)
Use zyrtec and astelin and flonase  If no better in 1-2 weeks or if symptoms worsen --- can fill abx rx  F/u prn

## 2022-03-06 ENCOUNTER — Other Ambulatory Visit: Payer: Self-pay | Admitting: Family Medicine

## 2022-03-06 ENCOUNTER — Other Ambulatory Visit (HOSPITAL_COMMUNITY): Payer: Self-pay

## 2022-03-06 MED ORDER — AZITHROMYCIN 250 MG PO TABS: ORAL_TABLET | ORAL | 0 refills | Status: DC

## 2022-03-06 MED ORDER — AZITHROMYCIN 250 MG PO TABS
ORAL_TABLET | ORAL | 0 refills | Status: DC
Start: 2022-03-06 — End: 2022-03-06
  Filled 2022-03-06: qty 6, 5d supply, fill #0

## 2022-03-15 ENCOUNTER — Other Ambulatory Visit: Payer: Self-pay

## 2022-03-15 ENCOUNTER — Other Ambulatory Visit (HOSPITAL_COMMUNITY): Payer: Self-pay

## 2022-03-15 ENCOUNTER — Telehealth: Payer: Self-pay | Admitting: Family Medicine

## 2022-03-15 MED ORDER — CYANOCOBALAMIN 1000 MCG/ML IJ SOLN
INTRAMUSCULAR | 0 refills | Status: DC
  Filled 2022-03-15: qty 4, 28d supply, fill #0

## 2022-03-15 MED ORDER — CYANOCOBALAMIN 1000 MCG/ML IJ SOLN
1000.0000 ug | Freq: Once | INTRAMUSCULAR | 0 refills | Status: AC
  Filled 2022-03-15: qty 1, 1d supply, fill #0
  Filled 2022-06-01: qty 1, 30d supply, fill #0

## 2022-03-15 MED ORDER — CYANOCOBALAMIN 1000 MCG/ML IJ SOLN
1000.0000 ug | INTRAMUSCULAR | 1 refills | Status: DC
  Filled 2022-03-15: qty 3, 90d supply, fill #0

## 2022-03-15 MED ORDER — "SYRINGE 25G X 1"" 3 ML MISC"
1 refills | Status: DC
  Filled 2022-03-15: qty 10, 10d supply, fill #0
  Filled 2022-07-09: qty 10, 10d supply, fill #1

## 2022-03-15 NOTE — Telephone Encounter (Signed)
Left detailed message that rxs have been sent in.

## 2022-03-15 NOTE — Telephone Encounter (Signed)
Pt stated his wife can do his b-12 shots and he wants them to go to [facility].

## 2022-03-20 ENCOUNTER — Other Ambulatory Visit: Payer: Self-pay

## 2022-03-20 DIAGNOSIS — R739 Hyperglycemia, unspecified: Secondary | ICD-10-CM

## 2022-03-21 ENCOUNTER — Other Ambulatory Visit (INDEPENDENT_AMBULATORY_CARE_PROVIDER_SITE_OTHER): Payer: No Typology Code available for payment source

## 2022-03-21 DIAGNOSIS — R739 Hyperglycemia, unspecified: Secondary | ICD-10-CM

## 2022-03-21 LAB — BASIC METABOLIC PANEL
BUN: 21 mg/dL (ref 6–23)
CO2: 29 mEq/L (ref 19–32)
Calcium: 9.3 mg/dL (ref 8.4–10.5)
Chloride: 104 mEq/L (ref 96–112)
Creatinine, Ser: 1.32 mg/dL (ref 0.40–1.50)
GFR: 60.74 mL/min (ref >60.00)
Glucose, Bld: 112 mg/dL — ABNORMAL HIGH (ref 70–99)
Potassium: 4.5 mEq/L (ref 3.5–5.1)
Sodium: 141 mEq/L (ref 135–145)

## 2022-03-21 LAB — HEMOGLOBIN A1C: Hgb A1c MFr Bld: 6.1 % (ref 4.6–6.5)

## 2022-03-31 ENCOUNTER — Other Ambulatory Visit: Payer: Self-pay | Admitting: Family Medicine

## 2022-03-31 DIAGNOSIS — R739 Hyperglycemia, unspecified: Secondary | ICD-10-CM

## 2022-03-31 DIAGNOSIS — E782 Mixed hyperlipidemia: Secondary | ICD-10-CM

## 2022-04-03 ENCOUNTER — Other Ambulatory Visit (HOSPITAL_COMMUNITY): Payer: Self-pay

## 2022-04-03 ENCOUNTER — Other Ambulatory Visit: Payer: Self-pay | Admitting: Family Medicine

## 2022-04-03 MED ORDER — ROSUVASTATIN CALCIUM 10 MG PO TABS
10.0000 mg | ORAL_TABLET | Freq: Every day | ORAL | 2 refills | Status: DC
  Filled 2022-04-03: qty 90, 90d supply, fill #0
  Filled 2022-07-09: qty 90, 90d supply, fill #1
  Filled 2022-10-17: qty 90, 90d supply, fill #0

## 2022-04-19 NOTE — Addendum Note (Signed)
Addended by: Kelle Darting A on: 04/19/2022 07:44 AM   Modules accepted: Orders

## 2022-04-22 ENCOUNTER — Other Ambulatory Visit (INDEPENDENT_AMBULATORY_CARE_PROVIDER_SITE_OTHER): Payer: No Typology Code available for payment source

## 2022-04-22 DIAGNOSIS — R739 Hyperglycemia, unspecified: Secondary | ICD-10-CM | POA: Diagnosis not present

## 2022-04-22 DIAGNOSIS — E782 Mixed hyperlipidemia: Secondary | ICD-10-CM

## 2022-04-22 LAB — HEMOGLOBIN A1C: Hgb A1c MFr Bld: 6.1 % (ref 4.6–6.5)

## 2022-04-22 LAB — COMPREHENSIVE METABOLIC PANEL
ALT: 14 U/L (ref 0–53)
AST: 16 U/L (ref 0–37)
Albumin: 4.2 g/dL (ref 3.5–5.2)
Alkaline Phosphatase: 55 U/L (ref 39–117)
BUN: 17 mg/dL (ref 6–23)
CO2: 30 mEq/L (ref 19–32)
Calcium: 8.9 mg/dL (ref 8.4–10.5)
Chloride: 103 mEq/L (ref 96–112)
Creatinine, Ser: 1.12 mg/dL (ref 0.40–1.50)
GFR: 73.93 mL/min (ref >60.00)
Glucose, Bld: 107 mg/dL — ABNORMAL HIGH (ref 70–99)
Potassium: 4.6 mEq/L (ref 3.5–5.1)
Sodium: 139 mEq/L (ref 135–145)
Total Bilirubin: 0.7 mg/dL (ref 0.2–1.2)
Total Protein: 6.4 g/dL (ref 6.0–8.3)

## 2022-04-22 LAB — LIPID PANEL
Cholesterol: 101 mg/dL (ref 0–200)
HDL: 44.4 mg/dL (ref >39.00)
LDL Cholesterol: 42 mg/dL (ref 0–99)
NonHDL: 56.38
Total CHOL/HDL Ratio: 2
Triglycerides: 74 mg/dL (ref 0.0–149.0)
VLDL: 14.8 mg/dL (ref 0.0–40.0)

## 2022-04-25 ENCOUNTER — Encounter: Payer: Self-pay | Admitting: Family Medicine

## 2022-04-25 ENCOUNTER — Other Ambulatory Visit (HOSPITAL_COMMUNITY): Payer: Self-pay

## 2022-04-25 ENCOUNTER — Other Ambulatory Visit: Payer: Self-pay

## 2022-04-25 MED ORDER — CYANOCOBALAMIN 1000 MCG/ML IJ SOLN
1000.0000 ug | INTRAMUSCULAR | 0 refills | Status: DC
  Filled 2022-04-25: qty 1, 28d supply, fill #0

## 2022-05-03 ENCOUNTER — Other Ambulatory Visit (HOSPITAL_BASED_OUTPATIENT_CLINIC_OR_DEPARTMENT_OTHER): Payer: Self-pay

## 2022-05-03 ENCOUNTER — Other Ambulatory Visit (HOSPITAL_COMMUNITY): Payer: Self-pay

## 2022-05-20 ENCOUNTER — Encounter: Payer: Self-pay | Admitting: Family Medicine

## 2022-05-20 ENCOUNTER — Other Ambulatory Visit (HOSPITAL_COMMUNITY): Payer: Self-pay

## 2022-05-20 ENCOUNTER — Ambulatory Visit (INDEPENDENT_AMBULATORY_CARE_PROVIDER_SITE_OTHER): Payer: No Typology Code available for payment source | Admitting: Family Medicine

## 2022-05-20 VITALS — BP 118/88 | HR 61 | Temp 98.0°F | Resp 18 | Ht 70.0 in | Wt 182.6 lb

## 2022-05-20 DIAGNOSIS — J189 Pneumonia, unspecified organism: Secondary | ICD-10-CM | POA: Diagnosis not present

## 2022-05-20 MED ORDER — AZITHROMYCIN 250 MG PO TABS
ORAL_TABLET | ORAL | 0 refills | Status: AC
  Filled 2022-05-20: qty 6, 5d supply, fill #0

## 2022-05-20 MED ORDER — PROMETHAZINE-DM 6.25-15 MG/5ML PO SYRP
5.0000 mL | ORAL_SOLUTION | Freq: Four times a day (QID) | ORAL | 0 refills | Status: DC | PRN
  Filled 2022-05-20: qty 118, 6d supply, fill #0

## 2022-05-20 NOTE — Progress Notes (Addendum)
Chief Complaint  Patient presents with   Cough    Pt states having cough for 3 weeks but has gotten worse over the last few days. Pt states taking 2 COVID test and both were negative, about 2 weeks ago. Pt states OTC meds are not helping.     Bobby Gonzalez here for URI complaints.  Duration: 3 weeks  Associated symptoms: rhinorrhea and productive cough Denies: sinus congestion, sinus pain, itchy watery eyes, ear pain, ear drainage, sore throat, wheezing, shortness of breath, myalgia, and fevers Treatment to date: Mucinex, dextromethorphan Sick contacts: No Tested neg for covid x 2.  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 radiation and chemo 05-13-12(Dr. Lamonte Sakai)   Cardiac murmur 09/29/2020   Carotid atherosclerosis 03/01/2021   Chronic kidney disease    right renal mass 7.3x6.7x6.8cm renal cell ca   Dehydration 05/13/2012   Difficult intubation    POSSIBLE DIFFICULT INTUBATION--S/P RADIATION FOR CANCER LEFT TONSIL--EATING BUT THROAT STILL SORE AND PT HAS THICK MUCUS   Diverticula of colon 03/10/12   multiple rectosigmoid colonic diverticula/ ct abd/pelvis   Dizziness 11/13/2020   Dry mouth 05/16/2014   Essential hypertension 10/11/2019   Gastro-esophageal reflux disease without esophagitis    GERD (gastroesophageal reflux disease)    Hearing loss of right ear 05/29/2020   History of cancer tonsil 09/20/2016   History of head and neck radiation 09/20/2016   History of radiation therapy 03/30/12-05/15/12   left tonsil   HTN (hypertension) 06-25-12   at first start of chemo for squamous cell cancer of neck-left   Hyperlipidemia 05/21/2021   Hypothyroid 02/15/2013   Inguinal hernia 10/23/2012   Malignant tumor of kidney ([place]) 09/27/2020   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[place]) 09/27/2020   Multiple lung nodules on CT 05/16/2014   Pain     JOINT PAINS UPPER BODY AFTER RADIATION TREATMENTS   Personal history of malignant neoplasm of unspecified site of lip, oral cavity, and pharynx 09/27/2020   Personal history of other malignant neoplasm of kidney 09/27/2020   Phlegm in throat 05/13/2012   Preventative health care 05/16/2014   Preventive measure 05/16/2014   Renal cell carcinoma ([place]) 09/09/2012   Renal mass 03/30/2012   Sensorineural hearing loss, bilateral 09/20/2016   Solitary kidney, acquired 09/29/2020   Solitary pulmonary nodule 09/27/2020   Syncope 09/29/2020   Tinnitus aurium, right 05/29/2020   Tonsil cancer ([place]) 02/2012   SCCa of Left tonsil-S/P biopsy on 02/17/12.    Objective BP 118/88 (BP Location: Left Arm, Patient Position: Sitting, Cuff Size: Normal)   Pulse 61   Temp 98 F (36.7 C) (Oral)   Resp 18   Ht '5\' 10"'$  (1.778 m)   Wt 182 lb 9.6 oz (82.8 kg)   SpO2 96%   BMI 26.20 kg/m  General: Awake, alert, appears stated age HEENT: AT, [place], ears patent b/l and TM's neg, nares patent w/o discharge, pharynx pink and without exudates, MMM Neck: No masses or asymmetry Heart: RRR Lungs: coarse breath sounds in LLL field, otherwise clear, no accessory muscle use Psych: Age appropriate judgment and insight, normal mood and affect  Walking pneumonia - Plan: azithromycin (ZITHROMAX) 250 MG tablet, promethazine-dextromethorphan (PROMETHAZINE-DM) 6.25-15 MG/5ML syrup  Continue to push fluids, practice good hand hygiene, cover mouth when coughing. F/u prn. If starting to experience fevers,  shaking, or shortness of breath, seek immediate care. Pt voiced understanding and agreement to the plan.  [place], DO 05/20/22 11:36 AM

## 2022-05-20 NOTE — Patient Instructions (Signed)
Continue to push fluids, practice good hand hygiene, and cover your mouth if you cough. ? ?If you start having fevers, shaking or shortness of breath, seek immediate care. ? ?OK to take Tylenol 1000 mg (2 extra strength tabs) or 975 mg (3 regular strength tabs) every 6 hours as needed. ? ?Let us know if you need anything. ?

## 2022-05-31 ENCOUNTER — Telehealth: Payer: Self-pay | Admitting: Family Medicine

## 2022-05-31 ENCOUNTER — Other Ambulatory Visit (HOSPITAL_COMMUNITY): Payer: Self-pay

## 2022-05-31 MED ORDER — CYANOCOBALAMIN 1000 MCG/ML IJ SOLN
1000.0000 ug | INTRAMUSCULAR | 0 refills | Status: DC
  Filled 2022-05-31 – 2022-07-09 (×2): qty 3, 90d supply, fill #0

## 2022-05-31 NOTE — Telephone Encounter (Signed)
Medication: cyanocobalamin (VITAMIN B12) 1000 MCG/ML injection   Has the patient contacted their pharmacy? No.  Preferred Pharmacy:  [facility]   [address] [place] [address]  Phone:  [phone]  Fax:  [phone]

## 2022-05-31 NOTE — Telephone Encounter (Signed)
Refill sent.

## 2022-06-01 ENCOUNTER — Other Ambulatory Visit (HOSPITAL_COMMUNITY): Payer: Self-pay

## 2022-06-03 ENCOUNTER — Other Ambulatory Visit (HOSPITAL_COMMUNITY): Payer: Self-pay

## 2022-06-11 ENCOUNTER — Other Ambulatory Visit (HOSPITAL_COMMUNITY): Payer: Self-pay

## 2022-06-11 ENCOUNTER — Other Ambulatory Visit: Payer: Self-pay | Admitting: Family Medicine

## 2022-06-11 MED ORDER — PILOCARPINE HCL 5 MG PO TABS
5.0000 mg | ORAL_TABLET | Freq: Three times a day (TID) | ORAL | 3 refills | Status: DC
  Filled 2022-06-11: qty 90, 30d supply, fill #0
  Filled 2022-07-09: qty 90, 30d supply, fill #1
  Filled 2022-09-26: qty 90, 30d supply, fill #2
  Filled 2022-11-19: qty 90, 30d supply, fill #3

## 2022-06-12 ENCOUNTER — Ambulatory Visit (INDEPENDENT_AMBULATORY_CARE_PROVIDER_SITE_OTHER): Payer: No Typology Code available for payment source | Admitting: Vascular Surgery

## 2022-06-12 ENCOUNTER — Ambulatory Visit (HOSPITAL_COMMUNITY)
Admission: RE | Admit: 2022-06-12 | Discharge: 2022-06-12 | Disposition: A | Discharge status: Home / Self Care | Payer: No Typology Code available for payment source | Source: Ambulatory Visit | Attending: Vascular Surgery | Admitting: Vascular Surgery

## 2022-06-12 ENCOUNTER — Encounter: Payer: Self-pay | Admitting: Vascular Surgery

## 2022-06-12 VITALS — BP 154/93 | HR 52 | Temp 98.3°F | Resp 20 | Ht 70.0 in | Wt 181.0 lb

## 2022-06-12 DIAGNOSIS — I6522 Occlusion and stenosis of left carotid artery: Secondary | ICD-10-CM | POA: Insufficient documentation

## 2022-06-12 NOTE — Progress Notes (Signed)
Patient ID: Bobby Gonzalez, male   DOB: 1966-05-10, 56 y.o.   MRN: [id number]  Reason for Consult: Follow-up   Referred by Carollee Herter, Alferd Apa, *  Subjective:     HPI:  Bobby Gonzalez is a 56 y.o. male with history of left carotid artery stenosis secondary to radiation and hypertension.  He is a lifelong non-smoker continues to work in [employer] he has had some issues with turning his head and feeling dizzy and this is actually increased recently.  He does not have any frank stroke, TIA or amaurosis.  He continues on aspirin and a statin.  He is here today with carotid duplex for further evaluation.  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 radiation and chemo 05-13-12(Dr. Lamonte Sakai)   Cardiac murmur 09/29/2020   Carotid atherosclerosis 03/01/2021   Chronic kidney disease    right renal mass 7.3x6.7x6.8cm renal cell ca   Dehydration 05/13/2012   Difficult intubation    POSSIBLE DIFFICULT INTUBATION--S/P RADIATION FOR CANCER LEFT TONSIL--EATING BUT THROAT STILL SORE AND PT HAS THICK MUCUS   Diverticula of colon 03/10/12   multiple rectosigmoid colonic diverticula/ ct abd/pelvis   Dizziness 11/13/2020   Dry mouth 05/16/2014   Essential hypertension 10/11/2019   Gastro-esophageal reflux disease without esophagitis    GERD (gastroesophageal reflux disease)    Hearing loss of right ear 05/29/2020   History of cancer tonsil 09/20/2016   History of head and neck radiation 09/20/2016   History of radiation therapy 03/30/12-05/15/12   left tonsil   HTN (hypertension) 06-25-12   at first start of chemo for squamous cell cancer of neck-left   Hyperlipidemia 05/21/2021   Hypothyroid 02/15/2013   Inguinal hernia 10/23/2012   Malignant tumor of kidney ([place]) 09/27/2020   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[place]) 09/27/2020   Multiple lung  nodules on CT 05/16/2014   Pain    JOINT PAINS UPPER BODY AFTER RADIATION TREATMENTS   Personal history of malignant neoplasm of unspecified site of lip, oral cavity, and pharynx 09/27/2020   Personal history of other malignant neoplasm of kidney 09/27/2020   Phlegm in throat 05/13/2012   Preventative health care 05/16/2014   Preventive measure 05/16/2014   Renal cell carcinoma ([place]) 09/09/2012   Renal mass 03/30/2012   Sensorineural hearing loss, bilateral 09/20/2016   Solitary kidney, acquired 09/29/2020   Solitary pulmonary nodule 09/27/2020   Syncope 09/29/2020   Tinnitus aurium, right 05/29/2020   Tonsil cancer ([place]) 02/2012   SCCa of Left tonsil-S/P biopsy on 02/17/12.   Family History  Problem Relation Age of Onset   Stroke Father    Cancer Father        Prostate   Hypertension Father    Thyroid disease Brother    Hypertension Brother    Throat cancer Brother    Colon cancer Neg Hx    Esophageal cancer Neg Hx    Rectal cancer Neg Hx    Stomach cancer Neg Hx    Past Surgical History:  Procedure Laterality Date   DIRECT LARYNGOSCOPY     S/P DL, Biopsy-Dr. Constance Holster. Pathology postive for SCCa of Left Tonsil   GASTROSTOMY TUBE PLACEMENT  03/02/12   INGUINAL HERNIA REPAIR Left 11/04/2012   Procedure: HERNIA REPAIR INGUINAL ADULT;  Surgeon: Joyice Faster. Cornett, MD;  Location:  [facility];  Service: General;  Laterality: Left;   INSERTION OF MESH Left 11/04/2012   Procedure: INSERTION OF MESH;  Surgeon: Joyice Faster. Cornett, MD;  Location: [facility];  Service: General;  Laterality: Left;   LAPAROSCOPIC NEPHRECTOMY  06/29/2012   Procedure: LAPAROSCOPIC NEPHRECTOMY;  Surgeon: Dutch Gray, MD;  Location: [facility];  Service: Urology;  Laterality: Right;   MULTIPLE TOOTH EXTRACTIONS  03/04/12   with DR. Mohorn   PEG TUBE PLACEMENT  06-25-12   remains in place abdomen-not using at present.   PEG TUBE REMOVAL  10/2012   tonsil biopsy  02/17/12   SCCa left tomnsil,HPV positive,spread to l cervical node   WISDOM TOOTH  EXTRACTION      Short Social History:  Social History   Tobacco Use   Smoking status: Never   Smokeless tobacco: Never  Substance Use Topics   Alcohol use: No    Comment: Never drank alocohol.    Allergies  Allergen Reactions   Banana Shortness Of Breath   Penicillins     Current Outpatient Medications  Medication Sig Dispense Refill   aspirin EC 81 MG tablet Take 81 mg by mouth daily.     azelastine (ASTELIN) 0.1 % nasal spray Place 1 spray into both nostrils 2 (two) times daily. Use in each nostril as directed 30 mL 12   cetirizine (ZYRTEC) 10 MG tablet Take 10 mg by mouth daily.     cyanocobalamin (VITAMIN B12) 1000 MCG/ML injection Inject 1 mL (1,000 mcg total) into the muscle every 30 (thirty) days. 10 mL 0   levothyroxine (SYNTHROID) 88 MCG tablet TAKE 1 TABLET BY MOUTH DAILY 30 tablet 2   levothyroxine (SYNTHROID) 88 MCG tablet Take 1 tablet (88 mcg total) by mouth daily before breakfast. 90 tablet 3   Multiple Vitamin (MULTIVITAMIN) tablet Take 1 tablet by mouth daily.     pilocarpine (SALAGEN) 5 MG tablet Take 1 tablet by mouth 3 (three) times daily. 90 tablet 3   promethazine-dextromethorphan (PROMETHAZINE-DM) 6.25-15 MG/5ML syrup Take 5 mLs by mouth 4 (four) times daily as needed for cough. 118 mL 0   rosuvastatin (CRESTOR) 10 MG tablet Take 1 tablet (10 mg total) by mouth daily. 90 tablet 2   Syringe/Needle, Disp, (SYRINGE 3CC/25GX1") 25G X 1" 3 ML MISC Use as directed with b12 injections 10 each 1   valsartan (DIOVAN) 40 MG tablet Take 1 tablet (40 mg total) by mouth daily. 90 tablet 3   No current facility-administered medications for this visit.    Review of Systems  Constitutional:  Constitutional negative. HENT: HENT negative.  Eyes: Eyes negative.  Respiratory: Respiratory negative.  Cardiovascular: Cardiovascular negative.  GI: Gastrointestinal negative.  Musculoskeletal: Musculoskeletal negative.  Skin: Skin negative.  Neurological: Positive for  dizziness.  Hematologic: Hematologic/lymphatic negative.  Psychiatric: Psychiatric negative.        Objective:  Objective   Vitals:   06/12/22 1550 06/12/22 1551  BP: (!) 153/100 (!) 154/93  Pulse: (!) 52   Resp: 20   Temp: 98.3 F (36.8 C)   SpO2: 98%   Weight: 181 lb (82.1 kg)   Height: '5\' 10"'$  (1.778 m)    Body mass index is 25.97 kg/m.  Physical Exam HENT:     Head: Normocephalic.     Mouth/Throat:     Mouth: Mucous membranes are moist.  Neck:     Vascular: No carotid bruit.     Comments: Skin on the left neck is thickened Cardiovascular:  Rate and Rhythm: Normal rate.     Pulses: Normal pulses.  Abdominal:     General: Abdomen is flat.  Musculoskeletal:        General: Normal range of motion.     Cervical back: Normal range of motion.     Right lower leg: No edema.     Left lower leg: No edema.  Skin:    General: Skin is warm and dry.     Capillary Refill: Capillary refill takes less than 2 seconds.  Neurological:     General: No focal deficit present.     Mental Status: He is alert.     Data: Right Carotid Findings:  +----------+--------+--------+--------+------------------+------------+            PSV cm/sEDV cm/sStenosisPlaque DescriptionComments      +----------+--------+--------+--------+------------------+------------+  CCA Prox  106     29                                              +----------+--------+--------+--------+------------------+------------+  CCA Mid   116     31                                              +----------+--------+--------+--------+------------------+------------+  CCA Distal161     48      >50%                      slight curve  +----------+--------+--------+--------+------------------+------------+  ICA Prox  213     39      1-39%   homogeneous                     +----------+--------+--------+--------+------------------+------------+  ICA Mid   77      32                                               +----------+--------+--------+--------+------------------+------------+  ICA Distal82      35                                              +----------+--------+--------+--------+------------------+------------+  ECA       178     21              heterogenous                    +----------+--------+--------+--------+------------------+------------+   +----------+--------+-------+----------------+-------------------+            PSV cm/sEDV cmsDescribe        Arm Pressure (mmHG)  +----------+--------+-------+----------------+-------------------+  [id number]     17     Multiphasic, [id number]                  +----------+--------+-------+----------------+-------------------+   +---------+--------+--+--------+--+---------+  VertebralPSV cm/s53EDV cm/s23Antegrade  +---------+--------+--+--------+--+---------+       Left Carotid Findings:  +----------+--------+--------+--------+------------------+--------+            PSV cm/sEDV cm/sStenosisPlaque DescriptionComments  +----------+--------+--------+--------+------------------+--------+  CCA Prox  113     29                                          +----------+--------+--------+--------+------------------+--------+  CCA Mid   118     34                                          +----------+--------+--------+--------+------------------+--------+  CCA Distal97      32                                          +----------+--------+--------+--------+------------------+--------+  ICA Prox  337     147     80-99%  homogeneous                 +----------+--------+--------+--------+------------------+--------+  ICA Mid   243     89                                          +----------+--------+--------+--------+------------------+--------+  ICA Distal136     45                                           +----------+--------+--------+--------+------------------+--------+  ECA       175     30                                          +----------+--------+--------+--------+------------------+--------+   +----------+--------+--------+----------------+-------------------+            PSV cm/sEDV cm/sDescribe        Arm Pressure (mmHG)  +----------+--------+--------+----------------+-------------------+  Subclavian120     5       Multiphasic, [id number]                  +----------+--------+--------+----------------+-------------------+   +---------+--------+--+--------+--+---------+  VertebralPSV cm/s55EDV cm/s19Antegrade  +---------+--------+--+--------+--+---------+           Summary:  Right Carotid: Velocities in the right ICA are consistent with a 1-39%  stenosis.   Left Carotid: Velocities in the left ICA are consistent with a 80-99%  stenosis.   Vertebrals:  Bilateral vertebral arteries demonstrate antegrade flow.  Subclavians: Normal flow hemodynamics were seen in bilateral subclavian               arteries.      Assessment/Plan:    56 year old male with history of left neck radiation and high-grade carotid artery stenosis which seems to have increased by recent velocities.  By previous CT scan appeared to be a candidate for transcarotid artery stenting and we will get repeat CT scan to consider this in the future if in fact the level of stenosis is confirmed.  We discussed the signs and symptoms of stroke and he demonstrates good understanding I will see him back after CT angio.     Waynetta Sandy MD [facility]

## 2022-06-24 ENCOUNTER — Other Ambulatory Visit: Payer: Self-pay

## 2022-06-24 DIAGNOSIS — I6522 Occlusion and stenosis of left carotid artery: Secondary | ICD-10-CM

## 2022-06-25 ENCOUNTER — Other Ambulatory Visit: Payer: Self-pay

## 2022-07-01 ENCOUNTER — Ambulatory Visit (HOSPITAL_COMMUNITY)
Admission: RE | Admit: 2022-07-01 | Discharge: 2022-07-01 | Disposition: A | Discharge status: Home / Self Care | Payer: No Typology Code available for payment source | Source: Ambulatory Visit | Attending: Vascular Surgery | Admitting: Vascular Surgery

## 2022-07-01 DIAGNOSIS — I6522 Occlusion and stenosis of left carotid artery: Secondary | ICD-10-CM | POA: Diagnosis present

## 2022-07-01 MED ORDER — IOHEXOL 350 MG/ML SOLN
75.0000 mL | Freq: Once | INTRAVENOUS | Status: AC | PRN
  Administered 2022-07-01: 75 mL via INTRAVENOUS

## 2022-07-09 ENCOUNTER — Other Ambulatory Visit (HOSPITAL_COMMUNITY): Payer: Self-pay

## 2022-07-10 ENCOUNTER — Other Ambulatory Visit (HOSPITAL_COMMUNITY): Payer: Self-pay

## 2022-07-10 ENCOUNTER — Ambulatory Visit (INDEPENDENT_AMBULATORY_CARE_PROVIDER_SITE_OTHER): Payer: No Typology Code available for payment source | Admitting: Vascular Surgery

## 2022-07-10 ENCOUNTER — Encounter: Payer: Self-pay | Admitting: Vascular Surgery

## 2022-07-10 VITALS — BP 135/75 | HR 64 | Temp 98.3°F | Resp 20 | Ht 70.0 in | Wt 183.0 lb

## 2022-07-10 DIAGNOSIS — I6522 Occlusion and stenosis of left carotid artery: Secondary | ICD-10-CM | POA: Diagnosis not present

## 2022-07-10 MED ORDER — CLOPIDOGREL BISULFATE 75 MG PO TABS
75.0000 mg | ORAL_TABLET | Freq: Every day | ORAL | 6 refills | Status: DC
  Filled 2022-07-10: qty 30, 30d supply, fill #0
  Filled 2022-08-07: qty 30, 30d supply, fill #1
  Filled 2022-09-11: qty 30, 30d supply, fill #2
  Filled 2022-10-17: qty 30, 30d supply, fill #3
  Filled 2022-11-09: qty 30, 30d supply, fill #4
  Filled 2022-12-12: qty 30, 30d supply, fill #5
  Filled 2023-01-13: qty 30, 30d supply, fill #6

## 2022-07-10 NOTE — Progress Notes (Signed)
Patient ID: Bobby Gonzalez, male   DOB: 1966-03-15, 55 y.o.   MRN: [id number]  Reason for Consult: Follow-up   Referred by Carollee Herter, Alferd Apa, *  Subjective:     HPI:  Bobby Gonzalez is a 56 y.o. male with history of left carotid stenosis secondary to radiation.  He remains asymptomatic.  He is on aspirin and statin.  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 radiation and chemo 05-13-12(Dr. Lamonte Sakai)   Cardiac murmur 09/29/2020   Carotid atherosclerosis 03/01/2021   Chronic kidney disease    right renal mass 7.3x6.7x6.8cm renal cell ca   Dehydration 05/13/2012   Difficult intubation    POSSIBLE DIFFICULT INTUBATION--S/P RADIATION FOR CANCER LEFT TONSIL--EATING BUT THROAT STILL SORE AND PT HAS THICK MUCUS   Diverticula of colon 03/10/12   multiple rectosigmoid colonic diverticula/ ct abd/pelvis   Dizziness 11/13/2020   Dry mouth 05/16/2014   Essential hypertension 10/11/2019   Gastro-esophageal reflux disease without esophagitis    GERD (gastroesophageal reflux disease)    Hearing loss of right ear 05/29/2020   History of cancer tonsil 09/20/2016   History of head and neck radiation 09/20/2016   History of radiation therapy 03/30/12-05/15/12   left tonsil   HTN (hypertension) 06-25-12   at first start of chemo for squamous cell cancer of neck-left   Hyperlipidemia 05/21/2021   Hypothyroid 02/15/2013   Inguinal hernia 10/23/2012   Malignant tumor of kidney ([place]) 09/27/2020   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[place]) 09/27/2020   Multiple lung nodules on CT 05/16/2014   Pain    JOINT PAINS UPPER BODY AFTER RADIATION TREATMENTS   Personal history of malignant neoplasm of unspecified site of lip, oral cavity, and pharynx 09/27/2020   Personal history of other malignant neoplasm of kidney 09/27/2020   Phlegm in throat 05/13/2012   Preventative  health care 05/16/2014   Preventive measure 05/16/2014   Renal cell carcinoma ([place]) 09/09/2012   Renal mass 03/30/2012   Sensorineural hearing loss, bilateral 09/20/2016   Solitary kidney, acquired 09/29/2020   Solitary pulmonary nodule 09/27/2020   Syncope 09/29/2020   Tinnitus aurium, right 05/29/2020   Tonsil cancer ([place]) 02/2012   SCCa of Left tonsil-S/P biopsy on 02/17/12.   Family History  Problem Relation Age of Onset   Stroke Father    Cancer Father        Prostate   Hypertension Father    Thyroid disease Brother    Hypertension Brother    Throat cancer Brother    Colon cancer Neg Hx    Esophageal cancer Neg Hx    Rectal cancer Neg Hx    Stomach cancer Neg Hx    Past Surgical History:  Procedure Laterality Date   DIRECT LARYNGOSCOPY     S/P DL, Biopsy-Dr. Constance Holster. Pathology postive for SCCa of Left Tonsil   GASTROSTOMY TUBE PLACEMENT  03/02/12   INGUINAL HERNIA REPAIR Left 11/04/2012   Procedure: HERNIA REPAIR INGUINAL ADULT;  Surgeon: Joyice Faster. Cornett, MD;  Location: [facility];  Service: General;  Laterality: Left;   INSERTION OF MESH Left 11/04/2012   Procedure: INSERTION OF MESH;  Surgeon: Joyice Faster. Cornett, MD;  Location: [facility];  Service: General;  Laterality: Left;   LAPAROSCOPIC NEPHRECTOMY  06/29/2012   Procedure: LAPAROSCOPIC NEPHRECTOMY;  Surgeon: Dutch Gray, MD;  Location: [facility];  Service: Urology;  Laterality: Right;   MULTIPLE TOOTH EXTRACTIONS  03/04/12   with DR. Mohorn   PEG TUBE PLACEMENT  06-25-12   remains in place abdomen-not using at present.   PEG TUBE REMOVAL  10/2012   tonsil biopsy  02/17/12   SCCa left tomnsil,HPV positive,spread to l cervical node   WISDOM TOOTH EXTRACTION      Short Social History:  Social History   Tobacco Use   Smoking status: Never   Smokeless tobacco: Never  Substance Use Topics   Alcohol use: No    Comment: Never drank alocohol.    Allergies  Allergen Reactions   Banana Shortness Of Breath   Penicillins     Current  Outpatient Medications  Medication Sig Dispense Refill   aspirin EC 81 MG tablet Take 81 mg by mouth daily.     azelastine (ASTELIN) 0.1 % nasal spray Place 1 spray into both nostrils 2 (two) times daily. Use in each nostril as directed 30 mL 12   cetirizine (ZYRTEC) 10 MG tablet Take 10 mg by mouth daily.     clopidogrel (PLAVIX) 75 MG tablet Take 1 tablet (75 mg total) by mouth daily. 30 tablet 6   cyanocobalamin (VITAMIN B12) 1000 MCG/ML injection Inject 1 mL (1,000 mcg total) into the muscle every 30 (thirty) days. 10 mL 0   levothyroxine (SYNTHROID) 88 MCG tablet TAKE 1 TABLET BY MOUTH DAILY 30 tablet 2   levothyroxine (SYNTHROID) 88 MCG tablet Take 1 tablet (88 mcg total) by mouth daily before breakfast. 90 tablet 3   Multiple Vitamin (MULTIVITAMIN) tablet Take 1 tablet by mouth daily.     pilocarpine (SALAGEN) 5 MG tablet Take 1 tablet by mouth 3 (three) times daily. 90 tablet 3   promethazine-dextromethorphan (PROMETHAZINE-DM) 6.25-15 MG/5ML syrup Take 5 mLs by mouth 4 (four) times daily as needed for cough. 118 mL 0   rosuvastatin (CRESTOR) 10 MG tablet Take 1 tablet (10 mg total) by mouth daily. 90 tablet 2   Syringe/Needle, Disp, (SYRINGE 3CC/25GX1") 25G X 1" 3 ML MISC Use as directed with b12 injections 10 each 1   valsartan (DIOVAN) 40 MG tablet Take 1 tablet (40 mg total) by mouth daily. 90 tablet 3   No current facility-administered medications for this visit.    Review of Systems  Constitutional:  Constitutional negative. HENT:       Left neck pain Eyes: Eyes negative.  Cardiovascular: Cardiovascular negative.  GI: Gastrointestinal negative.  Musculoskeletal: Musculoskeletal negative.  Skin: Skin negative.  Neurological: Neurological negative. Hematologic: Hematologic/lymphatic negative.  Psychiatric: Psychiatric negative.        Objective:  Objective   Vitals:   07/10/22 0816 07/10/22 0818  BP: 124/81 135/75  Pulse: 64   Resp: 20   Temp: 98.3 F (36.8 C)    SpO2: 98%   Weight: 183 lb (83 kg)   Height: '5\' 10"'$  (1.778 m)    Body mass index is 26.26 kg/m.  Physical Exam HENT:     Head: Normocephalic.     Comments: Left neck skin is somewhat tight    Mouth/Throat:     Mouth: Mucous membranes are moist.  Eyes:     Pupils: Pupils are equal, round, and reactive to light.  Cardiovascular:     Pulses: Normal pulses.  Abdominal:     General: Abdomen is flat.     Palpations: Abdomen is soft.  Musculoskeletal:        General: Normal range  of motion.     Right lower leg: No edema.     Left lower leg: No edema.  Skin:    General: Skin is warm.     Capillary Refill: Capillary refill takes less than 2 seconds.  Neurological:     Mental Status: He is alert.  Psychiatric:        Mood and Affect: Mood normal.        Behavior: Behavior normal.        Thought Content: Thought content normal.        Judgment: Judgment normal.     Data: CT IMPRESSION: Unchanged 55-60% proximal left ICA stenosis. New small atheromatous ulceration/excavation at the posterior aspect of the left carotid bulb.     Assessment/Plan:    56 year old male with what appears to be high-grade stenosis of the proximal ICA secondary to radiation.  We will follow this with ultrasound and velocities demonstrate greater than 80% stenosis. He is asymptomatic from this.  I discussed with him medical management versus surgical intervention which would be best served with stenting.  He has agreed to proceed.  I have sent Plavix to his pharmacy he will initiate this and we can plan for 3 months of Plavix post procedurally.  Patient will call to schedule in the near future.     Waynetta Sandy MD [facility]

## 2022-07-30 ENCOUNTER — Other Ambulatory Visit: Payer: Self-pay

## 2022-07-30 DIAGNOSIS — I6522 Occlusion and stenosis of left carotid artery: Secondary | ICD-10-CM

## 2022-08-05 ENCOUNTER — Encounter: Payer: Self-pay | Admitting: Family Medicine

## 2022-08-05 ENCOUNTER — Ambulatory Visit (INDEPENDENT_AMBULATORY_CARE_PROVIDER_SITE_OTHER): Payer: No Typology Code available for payment source | Admitting: Family Medicine

## 2022-08-05 VITALS — BP 120/70 | HR 61 | Temp 98.4°F | Resp 18 | Ht 70.0 in | Wt 186.2 lb

## 2022-08-05 DIAGNOSIS — K4090 Unilateral inguinal hernia, without obstruction or gangrene, not specified as recurrent: Secondary | ICD-10-CM

## 2022-08-05 DIAGNOSIS — E538 Deficiency of other specified B group vitamins: Secondary | ICD-10-CM | POA: Diagnosis not present

## 2022-08-05 LAB — CBC WITH DIFFERENTIAL/PLATELET
Basophils Absolute: 0 10*3/uL (ref 0.0–0.1)
Basophils Relative: 0.2 % (ref 0.0–3.0)
Eosinophils Absolute: 0.3 10*3/uL (ref 0.0–0.7)
Eosinophils Relative: 4.9 % (ref 0.0–5.0)
HCT: 46.1 % (ref 39.0–52.0)
Hemoglobin: 15.7 g/dL (ref 13.0–17.0)
Lymphocytes Relative: 12 % (ref 12.0–46.0)
Lymphs Abs: 0.9 10*3/uL (ref 0.7–4.0)
MCHC: 34 g/dL (ref 30.0–36.0)
MCV: 91.2 fl (ref 78.0–100.0)
Monocytes Absolute: 0.6 10*3/uL (ref 0.1–1.0)
Monocytes Relative: 8.5 % (ref 3.0–12.0)
Neutro Abs: 5.3 10*3/uL (ref 1.4–7.7)
Neutrophils Relative %: 74.4 % (ref 43.0–77.0)
Platelets: 208 10*3/uL (ref 150.0–400.0)
RBC: 5.05 Mil/uL (ref 4.22–5.81)
RDW: 13 % (ref 11.5–15.5)
WBC: 7.2 10*3/uL (ref 4.0–10.5)

## 2022-08-05 LAB — VITAMIN B12: Vitamin B-12: 1241 pg/mL — ABNORMAL HIGH (ref 211–911)

## 2022-08-05 NOTE — Assessment & Plan Note (Signed)
Refer to surgery  If pain starts having pain --- go to er

## 2022-08-05 NOTE — Patient Instructions (Signed)
Inguinal Hernia, Adult An inguinal hernia develops when fat or the intestines push through a weak spot in a muscle where the leg meets the lower abdomen (groin). This creates a bulge. This kind of hernia could also be: In the scrotum, if you are male. In folds of skin around the vagina, if you are male. There are three types of inguinal hernias: Hernias that can be pushed back into the abdomen (are reducible). This type rarely causes pain. Hernias that are not reducible (are incarcerated). Hernias that are not reducible and lose their blood supply (are strangulated). This type of hernia requires emergency surgery. What are the causes? This condition is caused by having a weak spot in the muscles or tissues in your groin. This develops over time. The hernia may poke through the weak spot when you suddenly strain your lower abdominal muscles, such as when you: Lift a heavy object. Strain to have a bowel movement. Constipation can lead to straining. Cough. What increases the risk? This condition is more likely to develop in: Males. Pregnant females. People who: Are overweight. Work in jobs that require long periods of standing or heavy lifting. Have had an inguinal hernia before. Smoke or have lung disease. These factors can lead to long-term (chronic) coughing. What are the signs or symptoms? Symptoms may depend on the size of the hernia. Often, a small inguinal hernia has no symptoms. Symptoms of a larger hernia may include: A bulge in the groin area. This is easier to see when standing. It might not be visible when lying down. Pain or burning in the groin. This may get worse when lifting, straining, or coughing. A dull ache or a feeling of pressure in the groin. An unusual bulge in the scrotum, in males. Symptoms of a strangulated inguinal hernia may include: A bulge in your groin that is very painful and tender to the touch. A bulge that turns red or purple. Fever, nausea, and  vomiting. Inability to have a bowel movement or to pass gas. How is this diagnosed? This condition is diagnosed based on your symptoms, your medical history, and a physical exam. Your health care provider may feel your groin area and ask you to cough. How is this treated? Treatment depends on the size of your hernia and whether you have symptoms. If you do not have symptoms, your health care provider may have you watch your hernia carefully and have you come in for follow-up visits. If your hernia is large or if you have symptoms, you may need surgery to repair the hernia. Follow these instructions at home: Lifestyle Avoid lifting heavy objects. Avoid standing for long periods of time. Do not use any products that contain nicotine or tobacco. These products include cigarettes, chewing tobacco, and vaping devices, such as e-cigarettes. If you need help quitting, ask your health care provider. Maintain a healthy weight. Preventing constipation You may need to take these actions to prevent or treat constipation: Drink enough fluid to keep your urine pale yellow. Take over-the-counter or prescription medicines. Eat foods that are high in fiber, such as beans, whole grains, and fresh fruits and vegetables. Limit foods that are high in fat and processed sugars, such as fried or sweet foods. General instructions You may try to push the hernia back in place by very gently pressing on it while lying down. Do not try to force the bulge back in if it will not push in easily. Watch your hernia for any changes in shape, size, or   color. Get help right away if you notice any changes. Take over-the-counter and prescription medicines only as told by your health care provider. Keep all follow-up visits. This is important. Contact a health care provider if: You have a fever or chills. You develop new symptoms. Your symptoms get worse. Get help right away if: You have pain in your groin that suddenly gets  worse. You have a bulge in your groin that: Suddenly gets bigger and does not get smaller. Becomes red or purple or painful to the touch. You are a man and you have a sudden pain in your scrotum, or the size of your scrotum suddenly changes. You cannot push the hernia back in place by very gently pressing on it when you are lying down. You have nausea or vomiting that does not go away. You have a fast heartbeat. You cannot have a bowel movement or pass gas. These symptoms may represent a serious problem that is an emergency. Do not wait to see if the symptoms will go away. Get medical help right away. Call your local emergency services (911 in the [place] Summary An inguinal hernia develops when fat or the intestines push through a weak spot in a muscle where your leg meets your lower abdomen (groin). This condition is caused by having a weak spot in muscles or tissues in your groin. Symptoms may depend on the size of the hernia, and they may include pain or swelling in your groin. A small inguinal hernia often has no symptoms. Treatment may not be needed if you do not have symptoms. If you have symptoms or a large hernia, you may need surgery to repair the hernia. Avoid lifting heavy objects. Also, avoid standing for long periods of time. This information is not intended to replace advice given to you by your health care provider. Make sure you discuss any questions you have with your health care provider. Document Revised: 04/18/2020 Document Reviewed: 04/18/2020 Elsevier Patient Education  2023 Elsevier Inc.  

## 2022-08-05 NOTE — Progress Notes (Signed)
Subjective:   By signing my name below, I, Bobby Gonzalez, attest that this documentation has been prepared under the direction and in the presence of Ann Held DO 08/05/2022    Patient ID: Bobby Gonzalez, male    DOB: 1966/01/11, 56 y.o.   MRN: [id number]  No chief complaint on file.   HPI Patient is in today for an office visit  He complains of a swollen right groin area. He states prior to symptoms appearing, he noticed his testicle area was swollen. He had a cold about a week ago and states that when he coughs, there is pain in the area. He notes of lump in the groin area on the morning of 07/28/2022.    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 radiation and chemo 05-13-12(Dr. Lamonte Sakai)   Cardiac murmur 09/29/2020   Carotid atherosclerosis 03/01/2021   Chronic kidney disease    right renal mass 7.3x6.7x6.8cm renal cell ca   Dehydration 05/13/2012   Difficult intubation    POSSIBLE DIFFICULT INTUBATION--S/P RADIATION FOR CANCER LEFT TONSIL--EATING BUT THROAT STILL SORE AND PT HAS THICK MUCUS   Diverticula of colon 03/10/12   multiple rectosigmoid colonic diverticula/ ct abd/pelvis   Dizziness 11/13/2020   Dry mouth 05/16/2014   Essential hypertension 10/11/2019   Gastro-esophageal reflux disease without esophagitis    GERD (gastroesophageal reflux disease)    Hearing loss of right ear 05/29/2020   History of cancer tonsil 09/20/2016   History of head and neck radiation 09/20/2016   History of radiation therapy 03/30/12-05/15/12   left tonsil   HTN (hypertension) 06-25-12   at first start of chemo for squamous cell cancer of neck-left   Hyperlipidemia 05/21/2021   Hypothyroid 02/15/2013   Inguinal hernia 10/23/2012   Malignant tumor of kidney ([place]) 09/27/2020   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[place]) 09/27/2020   Multiple lung  nodules on CT 05/16/2014   Pain    JOINT PAINS UPPER BODY AFTER RADIATION TREATMENTS   Personal history of malignant neoplasm of unspecified site of lip, oral cavity, and pharynx 09/27/2020   Personal history of other malignant neoplasm of kidney 09/27/2020   Phlegm in throat 05/13/2012   Preventative health care 05/16/2014   Preventive measure 05/16/2014   Renal cell carcinoma ([place]) 09/09/2012   Renal mass 03/30/2012   Sensorineural hearing loss, bilateral 09/20/2016   Solitary kidney, acquired 09/29/2020   Solitary pulmonary nodule 09/27/2020   Syncope 09/29/2020   Tinnitus aurium, right 05/29/2020   Tonsil cancer ([place]) 02/2012   SCCa of Left tonsil-S/P biopsy on 02/17/12.    Past Surgical History:  Procedure Laterality Date   DIRECT LARYNGOSCOPY     S/P DL, Biopsy-Dr. Constance Holster. Pathology postive for SCCa of Left Tonsil   GASTROSTOMY TUBE PLACEMENT  03/02/12   INGUINAL HERNIA REPAIR Left 11/04/2012   Procedure: HERNIA REPAIR INGUINAL ADULT;  Surgeon: Joyice Faster. Cornett, MD;  Location: [facility];  Service: General;  Laterality: Left;   INSERTION OF MESH Left 11/04/2012   Procedure: INSERTION OF MESH;  Surgeon: Joyice Faster. Cornett, MD;  Location: [facility];  Service: General;  Laterality: Left;   LAPAROSCOPIC NEPHRECTOMY  06/29/2012   Procedure: LAPAROSCOPIC NEPHRECTOMY;  Surgeon: Dutch Gray, MD;  Location: [facility];  Service: Urology;  Laterality: Right;   MULTIPLE TOOTH EXTRACTIONS  03/04/12   with DR. Mohorn   PEG TUBE PLACEMENT  06-25-12   remains in place abdomen-not using at present.   PEG TUBE REMOVAL  10/2012   tonsil biopsy  02/17/12   SCCa left tomnsil,HPV positive,spread to l cervical node   WISDOM TOOTH EXTRACTION      Family History  Problem Relation Age of Onset   Stroke Father    Cancer Father        Prostate   Hypertension Father    Thyroid disease Brother    Hypertension Brother    Throat cancer Brother    Colon cancer Neg Hx    Esophageal cancer Neg Hx    Rectal cancer Neg Hx    Stomach  cancer Neg Hx     Social History   Socioeconomic History   Marital status: Married    Spouse name: Not on file   Number of children: 2   Years of education: Not on file   Highest education level: Associate degree: academic program  Occupational History    Employer: [employer], maintenance    Comment: Immunologist  Tobacco Use   Smoking status: Never   Smokeless tobacco: Never  Vaping Use   Vaping Use: Never used  Substance and Sexual Activity   Alcohol use: No    Comment: Never drank alocohol.   Drug use: No   Sexual activity: Yes  Other Topics Concern   Not on file  Social History Narrative   The patient is married and has 2 daughters.Patient has never been a smoker.Patient denies use of smokeless tobacco.Patient has never drank alcohol.   Right handed   Caffeine: no coffee or soda. Tea in the weekend, drinks mainly flavored water due to only having 1 kidney   Social Determinants of Health   Financial Resource Strain: Not on file  Food Insecurity: Not on file  Transportation Needs: Not on file  Physical Activity: Not on file  Stress: Not on file  Social Connections: Not on file  Intimate Partner Violence: Not on file    Outpatient Medications Prior to Visit  Medication Sig Dispense Refill   aspirin EC 81 MG tablet Take 81 mg by mouth daily.     azelastine (ASTELIN) 0.1 % nasal spray Place 1 spray into both nostrils 2 (two) times daily. Use in each nostril as directed 30 mL 12   cetirizine (ZYRTEC) 10 MG tablet Take 10 mg by mouth daily.     clopidogrel (PLAVIX) 75 MG tablet Take 1 tablet (75 mg total) by mouth daily. 30 tablet 6   cyanocobalamin (VITAMIN B12) 1000 MCG/ML injection Inject 1 mL (1,000 mcg total) into the muscle every 30 (thirty) days. 10 mL 0   levothyroxine (SYNTHROID) 88 MCG tablet TAKE 1 TABLET BY MOUTH DAILY 30 tablet 2   levothyroxine (SYNTHROID) 88 MCG tablet Take 1 tablet (88 mcg total) by mouth daily before breakfast. 90 tablet 3   Multiple  Vitamin (MULTIVITAMIN) tablet Take 1 tablet by mouth daily.     pilocarpine (SALAGEN) 5 MG tablet Take 1 tablet by mouth 3 (three) times daily. 90 tablet 3   promethazine-dextromethorphan (PROMETHAZINE-DM) 6.25-15 MG/5ML syrup Take 5 mLs by mouth 4 (four) times daily as needed for cough. 118 mL 0   rosuvastatin (CRESTOR) 10 MG tablet Take 1 tablet (10 mg total) by mouth daily. 90 tablet 2   Syringe/Needle, Disp, (SYRINGE 3CC/25GX1") 25G X 1" 3 ML MISC Use as directed with b12 injections 10 each 1  valsartan (DIOVAN) 40 MG tablet Take 1 tablet (40 mg total) by mouth daily. 90 tablet 3   No facility-administered medications prior to visit.    Allergies  Allergen Reactions   Banana Shortness Of Breath   Penicillins     Review of Systems  Constitutional:  Negative for chills, fever and malaise/fatigue.  HENT:  Negative for congestion and hearing loss.   Eyes:  Negative for discharge.  Respiratory:  Negative for cough, sputum production and shortness of breath.   Cardiovascular:  Negative for chest pain, palpitations and leg swelling.  Gastrointestinal:  Negative for abdominal pain, blood in stool, constipation, diarrhea, heartburn, nausea and vomiting.  Genitourinary:  Negative for dysuria, frequency, hematuria and urgency.       (+) Pain (Groin Area)   Musculoskeletal:  Negative for back pain, falls and myalgias.  Skin:  Negative for rash.  Neurological:  Negative for dizziness, sensory change, loss of consciousness, weakness and headaches.  Endo/Heme/Allergies:  Negative for environmental allergies. Does not bruise/bleed easily.  Psychiatric/Behavioral:  Negative for depression and suicidal ideas. The patient is not nervous/anxious and does not have insomnia.        Objective:    Physical Exam Vitals and nursing note reviewed.  Constitutional:      General: He is not in acute distress.    Appearance: Normal appearance. He is not ill-appearing.  HENT:     Head: Normocephalic  and atraumatic.     Right Ear: External ear normal.     Left Ear: External ear normal.  Eyes:     Extraocular Movements: Extraocular movements intact.     Pupils: Pupils are equal, round, and reactive to light.  Cardiovascular:     Rate and Rhythm: Normal rate and regular rhythm.     Heart sounds: Normal heart sounds. No murmur heard.    No gallop.  Pulmonary:     Effort: Pulmonary effort is normal. No respiratory distress.     Breath sounds: Normal breath sounds. No wheezing or rales.  Abdominal:     Tenderness: There is no abdominal tenderness. There is no right CVA tenderness, left CVA tenderness, guarding or rebound.     Hernia: A hernia is present. Hernia is present in the right inguinal area.  Skin:    General: Skin is warm and dry.  Neurological:     Mental Status: He is alert and oriented to person, place, and time.  Psychiatric:        Judgment: Judgment normal.     There were no vitals taken for this visit. Wt Readings from Last 3 Encounters:  07/10/22 183 lb (83 kg)  06/12/22 181 lb (82.1 kg)  05/20/22 182 lb 9.6 oz (82.8 kg)    Diabetic Foot Exam - Simple   No data filed    Lab Results  Component Value Date   WBC 6.6 03/04/2022   HGB 15.0 03/04/2022   HCT 45.0 03/04/2022   PLT 150.0 03/04/2022   GLUCOSE 107 (H) 04/22/2022   CHOL 101 04/22/2022   TRIG 74.0 04/22/2022   HDL 44.40 04/22/2022   LDLCALC 42 04/22/2022   ALT 14 04/22/2022   AST 16 04/22/2022   NA 139 04/22/2022   K 4.6 04/22/2022   CL 103 04/22/2022   CREATININE 1.12 04/22/2022   BUN 17 04/22/2022   CO2 30 04/22/2022   TSH 1.58 03/04/2022   PSA 0.19 11/19/2021   INR 0.94 03/02/2012   HGBA1C 6.1 04/22/2022    Lab Results  Component Value Date   TSH 1.58 03/04/2022   Lab Results  Component Value Date   WBC 6.6 03/04/2022   HGB 15.0 03/04/2022   HCT 45.0 03/04/2022   MCV 92.1 03/04/2022   PLT 150.0 03/04/2022   Lab Results  Component Value Date   NA 139 04/22/2022   K 4.6  04/22/2022   CHLORIDE 106 02/10/2017   CO2 30 04/22/2022   GLUCOSE 107 (H) 04/22/2022   BUN 17 04/22/2022   CREATININE 1.12 04/22/2022   BILITOT 0.7 04/22/2022   ALKPHOS 55 04/22/2022   AST 16 04/22/2022   ALT 14 04/22/2022   PROT 6.4 04/22/2022   ALBUMIN 4.2 04/22/2022   CALCIUM 8.9 04/22/2022   ANIONGAP 7 02/11/2018   EGFR 60 10/04/2021   GFR 73.93 04/22/2022   Lab Results  Component Value Date   CHOL 101 04/22/2022   Lab Results  Component Value Date   HDL 44.40 04/22/2022   Lab Results  Component Value Date   LDLCALC 42 04/22/2022   Lab Results  Component Value Date   TRIG 74.0 04/22/2022   Lab Results  Component Value Date   CHOLHDL 2 04/22/2022   Lab Results  Component Value Date   HGBA1C 6.1 04/22/2022       Assessment & Plan:   Problem List Items Addressed This Visit   None  No orders of the defined types were placed in this encounter.   I, Bobby Gonzalez, personally preformed the services described in this documentation.  All medical record entries made by the scribe were at my direction and in my presence.  I have reviewed the chart and discharge instructions (if applicable) and agree that the record reflects my personal performance and is accurate and complete. 08/05/2022   I,Amber Collins,acting as a scribe for Ann Held, DO.,have documented all relevant documentation on the behalf of Ann Held, DO,as directed by  Ann Held, DO while in the presence of Ann Held, DO.    [employer]

## 2022-08-07 ENCOUNTER — Other Ambulatory Visit (HOSPITAL_COMMUNITY): Payer: Self-pay

## 2022-08-09 ENCOUNTER — Encounter: Payer: Self-pay | Admitting: Family Medicine

## 2022-08-15 ENCOUNTER — Other Ambulatory Visit (HOSPITAL_COMMUNITY): Payer: Self-pay

## 2022-08-21 NOTE — Pre-Procedure Instructions (Signed)
Surgical Instructions    Your procedure is scheduled on Thursday, January 4th.  Report to [facility] Main Entrance "A" at 05:30 A.M., then check in with the Admitting office.  Call this number if you have problems the morning of surgery:  [phone]   If you have any questions prior to your surgery date call [phone]: Open Monday-Friday 8am-4pm    Remember:  Do not eat or drink after midnight the night before your surgery     Take these medicines the morning of surgery with A SIP OF WATER  azelastine (ASTELIN) nasal spray  cetirizine (ZYRTEC)  clopidogrel (PLAVIX)  aspirin  levothyroxine (SYNTHROID)  pilocarpine (SALAGEN)  rosuvastatin (CRESTOR)    As of today, STOP taking any Aleve, Naproxen, Ibuprofen, Motrin, Advil, Goody's, BC's, all herbal medications, fish oil, and all vitamins.                     Do NOT Smoke (Tobacco/Vaping) for 24 hours prior to your procedure.  If you use a CPAP at night, you may bring your mask/headgear for your overnight stay.   Contacts, glasses, piercing's, hearing aid's, dentures or partials may not be worn into surgery, please bring cases for these belongings.    For patients admitted to the hospital, discharge time will be determined by your treatment team.   Patients discharged the day of surgery will not be allowed to drive home, and someone needs to stay with them for 24 hours.  SURGICAL WAITING ROOM VISITATION Patients having surgery or a procedure may have no more than 2 support people in the waiting area - these visitors may rotate.   Children under the age of 26 must have an adult with them who is not the patient. If the patient needs to stay at the hospital during part of their recovery, the visitor guidelines for inpatient rooms apply. Pre-op nurse will coordinate an appropriate time for 1 support person to accompany patient in pre-op.  This support person may not rotate.   Please refer to the [facility] website for the  visitor guidelines for Inpatients (after your surgery is over and you are in a regular room).    Special instructions:   [facility]- Preparing For Surgery  Before surgery, you can play an important role. Because skin is not sterile, your skin needs to be as free of germs as possible. You can reduce the number of germs on your skin by washing with CHG (chlorahexidine gluconate) Soap before surgery.  CHG is an antiseptic cleaner which kills germs and bonds with the skin to continue killing germs even after washing.    Oral Hygiene is also important to reduce your risk of infection.  Remember - BRUSH YOUR TEETH THE MORNING OF SURGERY WITH YOUR REGULAR TOOTHPASTE  Please do not use if you have an allergy to CHG or antibacterial soaps. If your skin becomes reddened/irritated stop using the CHG.  Do not shave (including legs and underarms) for at least 48 hours prior to first CHG shower. It is OK to shave your face.  Please follow these instructions carefully.   Shower the NIGHT BEFORE SURGERY and the MORNING OF SURGERY  If you chose to wash your hair, wash your hair first as usual with your normal shampoo.  After you shampoo, rinse your hair and body thoroughly to remove the shampoo.  Use CHG Soap as you would any other liquid soap. You can apply CHG directly to the skin and wash gently with a  scrungie or a clean washcloth.   Apply the CHG Soap to your body ONLY FROM THE NECK DOWN.  Do not use on open wounds or open sores. Avoid contact with your eyes, ears, mouth and genitals (private parts). Wash Face and genitals (private parts)  with your normal soap.   Wash thoroughly, paying special attention to the area where your surgery will be performed.  Thoroughly rinse your body with warm water from the neck down.  DO NOT shower/wash with your normal soap after using and rinsing off the CHG Soap.  Pat yourself dry with a CLEAN TOWEL.  Wear CLEAN PAJAMAS to bed the night before  surgery  Place CLEAN SHEETS on your bed the night before your surgery  DO NOT SLEEP WITH PETS.   Day of Surgery: Take a shower with CHG soap. Do not wear jewelry  Do not wear lotions, powders, colognes, or deodorant. Men may shave face and neck. Do not bring valuables to the hospital. [facility] is not responsible for any belongings or valuables.  Wear Clean/Comfortable clothing the morning of surgery Remember to brush your teeth WITH YOUR REGULAR TOOTHPASTE.   Please read over the following fact sheets that you were given.    If you received a COVID test during your pre-op visit  it is requested that you wear a mask when out in public, stay away from anyone that may not be feeling well and notify your surgeon if you develop symptoms. If you have been in contact with anyone that has tested positive in the last 10 days please notify you surgeon.

## 2022-08-22 ENCOUNTER — Encounter (HOSPITAL_COMMUNITY): Payer: Self-pay

## 2022-08-22 ENCOUNTER — Other Ambulatory Visit: Payer: Self-pay

## 2022-08-22 ENCOUNTER — Telehealth: Payer: Self-pay | Admitting: Cardiology

## 2022-08-22 ENCOUNTER — Encounter (HOSPITAL_COMMUNITY)
Admission: RE | Admit: 2022-08-22 | Discharge: 2022-08-22 | Disposition: A | Discharge status: Home / Self Care | Payer: No Typology Code available for payment source | Source: Ambulatory Visit | Attending: Vascular Surgery | Admitting: Vascular Surgery

## 2022-08-22 VITALS — BP 130/85 | HR 72 | Temp 97.6°F | Resp 18 | Ht 70.0 in | Wt 185.7 lb

## 2022-08-22 DIAGNOSIS — I129 Hypertensive chronic kidney disease with stage 1 through stage 4 chronic kidney disease, or unspecified chronic kidney disease: Secondary | ICD-10-CM | POA: Insufficient documentation

## 2022-08-22 DIAGNOSIS — E039 Hypothyroidism, unspecified: Secondary | ICD-10-CM | POA: Diagnosis not present

## 2022-08-22 DIAGNOSIS — R011 Cardiac murmur, unspecified: Secondary | ICD-10-CM | POA: Diagnosis not present

## 2022-08-22 DIAGNOSIS — I6522 Occlusion and stenosis of left carotid artery: Secondary | ICD-10-CM | POA: Diagnosis not present

## 2022-08-22 DIAGNOSIS — N189 Chronic kidney disease, unspecified: Secondary | ICD-10-CM | POA: Insufficient documentation

## 2022-08-22 DIAGNOSIS — Z01818 Encounter for other preprocedural examination: Secondary | ICD-10-CM | POA: Diagnosis present

## 2022-08-22 DIAGNOSIS — E785 Hyperlipidemia, unspecified: Secondary | ICD-10-CM | POA: Insufficient documentation

## 2022-08-22 DIAGNOSIS — I10 Essential (primary) hypertension: Secondary | ICD-10-CM

## 2022-08-22 HISTORY — DX: Pneumonia, unspecified organism: J18.9

## 2022-08-22 LAB — CBC
HCT: 47.8 % (ref 39.0–52.0)
Hemoglobin: 15.9 g/dL (ref 13.0–17.0)
MCH: 30.5 pg (ref 26.0–34.0)
MCHC: 33.3 g/dL (ref 30.0–36.0)
MCV: 91.6 fL (ref 80.0–100.0)
Platelets: 236 10*3/uL (ref 150–400)
RBC: 5.22 MIL/uL (ref 4.22–5.81)
RDW: 12.2 % (ref 11.5–15.5)
WBC: 8.8 10*3/uL (ref 4.0–10.5)
nRBC: 0 % (ref 0.0–0.2)

## 2022-08-22 LAB — URINALYSIS, ROUTINE W REFLEX MICROSCOPIC
Bilirubin Urine: NEGATIVE
Glucose, UA: NEGATIVE mg/dL
Hgb urine dipstick: NEGATIVE
Ketones, ur: NEGATIVE mg/dL
Leukocytes,Ua: NEGATIVE
Nitrite: NEGATIVE
Protein, ur: NEGATIVE mg/dL
Specific Gravity, Urine: 1.003 — ABNORMAL LOW (ref 1.005–1.030)
pH: 6 (ref 5.0–8.0)

## 2022-08-22 LAB — APTT: aPTT: 32 seconds (ref 24–36)

## 2022-08-22 LAB — COMPREHENSIVE METABOLIC PANEL
ALT: 20 U/L (ref 0–44)
AST: 25 U/L (ref 15–41)
Albumin: 4.3 g/dL (ref 3.5–5.0)
Alkaline Phosphatase: 58 U/L (ref 38–126)
Anion gap: 6 (ref 5–15)
BUN: 14 mg/dL (ref 6–20)
CO2: 27 mmol/L (ref 22–32)
Calcium: 9.6 mg/dL (ref 8.9–10.3)
Chloride: 103 mmol/L (ref 98–111)
Creatinine, Ser: 1.16 mg/dL (ref 0.61–1.24)
GFR, Estimated: >60 mL/min (ref >60)
Glucose, Bld: 87 mg/dL (ref 70–99)
Potassium: 4 mmol/L (ref 3.5–5.1)
Sodium: 136 mmol/L (ref 135–145)
Total Bilirubin: 0.6 mg/dL (ref 0.3–1.2)
Total Protein: 7 g/dL (ref 6.5–8.1)

## 2022-08-22 LAB — SURGICAL PCR SCREEN
MRSA, PCR: NEGATIVE
Staphylococcus aureus: POSITIVE — AB

## 2022-08-22 LAB — PROTIME-INR
INR: 1 (ref 0.8–1.2)
Prothrombin Time: 13.2 seconds (ref 11.4–15.2)

## 2022-08-22 LAB — TYPE AND SCREEN
ABO/RH(D): O POS
Antibody Screen: NEGATIVE

## 2022-08-22 NOTE — Anesthesia Preprocedure Evaluation (Addendum)
Anesthesia Evaluation  Patient identified by MRN, date of birth, ID band Patient awake    Reviewed: Allergy & Precautions, NPO status , Patient's Chart, lab work & pertinent test results  History of Anesthesia Complications (+) DIFFICULT AIRWAY and history of anesthetic complications (POSSIBLE DIFFICULT INTUBATION--S/P RADIATION FOR CANCER LEFT TONSIL)  Airway Mallampati: II  TM Distance: <3 FB Neck ROM: Full    Dental  (+) Dental Advisory Given, Missing   Pulmonary neg pulmonary ROS   Pulmonary exam normal breath sounds clear to auscultation       Cardiovascular hypertension, Pt. on medications (-) angina + Peripheral Vascular Disease (Left carotid artery stenosis)  (-) Past MI Normal cardiovascular exam Rhythm:Regular Rate:Normal  Echo 10/30/20: IMPRESSIONS  1. Left ventricular ejection fraction, by estimation, is 60 to 65%. The  left ventricle has normal function. The left ventricle has no regional  wall motion abnormalities. There is moderate concentric left ventricular  hypertrophy. Left ventricular  diastolic parameters are consistent with Grade II diastolic dysfunction  (pseudonormalization). Elevated left atrial pressure.  2. Right ventricular systolic function is normal. The right ventricular  size is normal. There is normal pulmonary artery systolic pressure.  3. The mitral valve is normal in structure. No evidence of mitral valve  regurgitation. No evidence of mitral stenosis.  4. The aortic valve is tricuspid. Aortic valve regurgitation is not  visualized. No aortic stenosis is present.  5. The inferior vena cava is normal in size with greater than 50%  respiratory variability, suggesting right atrial pressure of 3 mmHg.     Neuro/Psych negative neurological ROS  negative psych ROS   GI/Hepatic Neg liver ROS,GERD  ,,  Endo/Other  Hypothyroidism    Renal/GU Renal InsufficiencyRenal disease (RCC s/p right  nephrectomy)     Musculoskeletal negative musculoskeletal ROS (+)    Abdominal   Peds  Hematology  (+) Blood dyscrasia (Plavix)   Anesthesia Other Findings   Reproductive/Obstetrics                             Anesthesia Physical Anesthesia Plan  ASA: 4  Anesthesia Plan: General   Post-op Pain Management: Ofirmev IV (intra-op)*   Induction: Intravenous  PONV Risk Score and Plan: 2 and Midazolam, Dexamethasone and Ondansetron  Airway Management Planned: Oral ETT and Video Laryngoscope Planned  Additional Equipment: Arterial line  Intra-op Plan:   Post-operative Plan: Extubation in OR  Informed Consent: I have reviewed the patients History and Physical, chart, labs and discussed the procedure including the risks, benefits and alternatives for the proposed anesthesia with the patient or authorized representative who has indicated his/her understanding and acceptance.     Dental advisory given  Plan Discussed with: CRNA  Anesthesia Plan Comments: (PAT note written 08/22/2022 by Myra Gianotti, PA-C.  )       Anesthesia Quick Evaluation

## 2022-08-22 NOTE — Progress Notes (Signed)
PCP - [facility] Cardiologist - Sunny Schlein Revankar   PPM/ICD - denies   Chest x-ray - denies EKG - 08/22/22 Stress Test - denies ECHO - 10/30/20 Cardiac Cath - denies  Sleep Study - denies   Continue to take plavix and aspirin.   ERAS Protcol -no   COVID TEST- not needed   Anesthesia review: yes, cardiac clearance needed?   Patient denies shortness of breath, fever, cough and chest pain at PAT appointment   All instructions explained to the patient, with a verbal understanding of the material. Patient agrees to go over the instructions while at home for a better understanding. Patient also instructed to self quarantine after being tested for COVID-19. The opportunity to ask questions was provided.

## 2022-08-22 NOTE — Telephone Encounter (Signed)
Patient states that he was told to call our office to let Dr. Geraldo Pitter know that the patient is having surgery. Please advise

## 2022-08-22 NOTE — Telephone Encounter (Signed)
Left vm for pt to callback 

## 2022-08-22 NOTE — Telephone Encounter (Signed)
Left vm to callback. Recommendation sent in [place].

## 2022-08-22 NOTE — Telephone Encounter (Signed)
Pt states that he is scheduled for surgery to get a stent in his left carotid. Please advise if you need to see the pt.

## 2022-08-22 NOTE — Progress Notes (Addendum)
Anesthesia Chart Review:  Case: [id number] Date/Time: 09/05/22 0715   Procedure: Transcarotid Artery Revascularization (Left)   Anesthesia type: General   Pre-op diagnosis: Left carotid artery stenosis   Location: [facility] ROOM 16 / [facility]   Surgeons: Waynetta Sandy, MD       DISCUSSION: Patient is a 56 year old male scheduled for the above procedure.  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 Gurney Maxin following recovery TCAR). He was flagged as a potential DIFFICULT INTUBATION given neck radiation history (03/30/12-05/15/12). Per  06/29/12 anesthesia intubation record: "Grade 3; Self; Endotracheal Tube; MAC, 4; Oral; 23 mm; Cuffed, Air, Min.occ.pres.; 1; Stylet; Direct Visualization."   He was first evaluated by cardiologist Dr. Geraldo Pitter on 09/29/20 for evaluation of syncopal episode. Echo and event monitor ordered.  February 2022 event monitor was overall unremarkable HR 44-162, average 72 bpm, 6 brief runs of SVT, rare PACs/PVCs. February 2022 echo showed normal LV/RV systolic function, grade II diastolic dysfunction, moderate concentric LVH, normal PASP, no significant valvular disease. He was later referred to vascular surgeon Dr. Donzetta Matters after he was found to have left ICA stenosis, likely from previous neck radiation. It does not sound like they think his carotid stenosis was related to his syncopal event. Last visit with Dr. Geraldo Pitter was on 09/12/21, secondary prevention stressed for vascular disease. Valsartan added for HTN, statin continued. He did call and let Dr. Geraldo Pitter know about plans for left TCAR, and no  additional recommendations given (see telephone communications).   Left ICA stenosis was 80-99% by 06/12/22 [place]. The above procedure recommended. He was started on ASA and Plavix. Anesthesia team to evaluate on the day of surgery.    VS: BP 130/85   Pulse 72   Temp 36.4 C (Oral)   Resp 18   Ht _0  (1.778 m)   Wt 84.2 kg   SpO2 98%   BMI 26.65 kg/m    PROVIDERS: Ann Held, DO is PCP  Jyl Heinz, MD is cardiologist. Last visit 09/12/21.  Donato Heinz, MD is nephrologist. 06/28/22 office note is scanned under Media tab. Stable CKD stage 2-3 due to solitary kidney.  [name], MD is ENT Star Age, MD is neurologist   LABS: Labs reviewed: Acceptable for surgery. A1c 6.1% 04/22/22.  (all labs ordered are listed, but only abnormal results are displayed)  Labs Reviewed  SURGICAL PCR SCREEN - Abnormal; Notable for the following components:      Result Value   Staphylococcus aureus POSITIVE (*)    All other components within normal limits  URINALYSIS, ROUTINE W REFLEX MICROSCOPIC - Abnormal; Notable for the following components:   Color, Urine COLORLESS (*)    Specific Gravity, Urine 1.003 (*)    All other components within normal limits  CBC  COMPREHENSIVE METABOLIC PANEL  PROTIME-INR  APTT  TYPE AND SCREEN    IMAGES: CTA Neck 07/01/22: IMPRESSION: Unchanged 55-60% proximal left ICA stenosis. New small atheromatous ulceration/excavation at the posterior aspect of the left carotid bulb.   MRI Brain 03/13/21: Normal MRI brain without contrast.    EKG: 08/22/22: Sinus rhythm with marked sinus arrhythmia Otherwise normal ECG When compared with ECG of 25-Jun-2012 15:06,  No significant change since Confirmed by Adrian Prows ([id number]) on 08/22/2022 12:29:18 PM   CV: US Carotid 06/12/22: Summary:  - Right Carotid: Velocities in the right ICA are consistent with a 1-39% stenosis.  - Left Carotid: Velocities in the left ICA are consistent with a  80-99% stenosis.  - Vertebrals: Bilateral vertebral arteries demonstrate antegrade flow.  - Subclavians: Normal flow hemodynamics were seen in bilateral subclavian arteries.    Echo 10/30/20: IMPRESSIONS   1. Left ventricular ejection fraction, by estimation, is 60 to 65%. The  left ventricle has normal function. The left ventricle has no regional  wall motion abnormalities. There is moderate concentric left ventricular  hypertrophy. Left ventricular  diastolic parameters are consistent with Grade II diastolic dysfunction  (pseudonormalization). Elevated left atrial pressure.   2. Right ventricular systolic function is normal. The right ventricular  size is normal. There is normal pulmonary artery systolic pressure.   3. The mitral valve is normal in structure. No evidence of mitral valve  regurgitation. No evidence of mitral stenosis.   4. The aortic valve is tricuspid. Aortic valve regurgitation is not  visualized. No aortic stenosis is present.   5. The inferior vena cava is normal in size with greater than 50%  respiratory variability, suggesting right atrial pressure of 3 mmHg.    Long term monitor 09/29/20-10/13/20: Patient had a min HR of 44 bpm, max HR of 162 bpm, and avg HR of 72 bpm. Predominant underlying rhythm was Sinus Rhythm. 6 Supraventricular Tachycardia runs occurred, the run with the fastest interval lasting 4 beats with a max rate of 162 bpm, the  longest lasting 9 beats with an avg rate of 141 bpm. Isolated SVEs were rare (<1.0%), SVE Couplets were rare (<1.0%), and SVE Triplets were rare (<1.0%). Isolated VEs were rare (<1.0%), VE Couplets were rare (<1.0%), and no VE Triplets were present.  Impression: 1. Mildly abnormal but largely unremarkable monitoring.   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  radiation and chemo 05-13-12(Dr. Lamonte Sakai)   Cardiac murmur 09/29/2020   Carotid atherosclerosis 03/01/2021   Chronic kidney disease    right renal mass 7.3x6.7x6.8cm renal cell ca   Dehydration 05/13/2012   Difficult intubation    POSSIBLE DIFFICULT INTUBATION--S/P RADIATION FOR CANCER LEFT TONSIL--EATING BUT THROAT STILL SORE AND PT HAS THICK MUCUS   Diverticula of colon 03/10/2012   multiple rectosigmoid colonic diverticula/ ct abd/pelvis   Dizziness 11/13/2020   Dry mouth 05/16/2014   Essential hypertension 10/11/2019   Gastro-esophageal reflux disease without esophagitis    GERD (gastroesophageal reflux disease)    Hearing loss of right ear 05/29/2020   History of cancer tonsil 09/20/2016   History of head and neck radiation 09/20/2016   History of radiation therapy 03/30/12-05/15/12   left tonsil   HTN (hypertension) 06/25/2012   at first start of chemo for squamous cell cancer of neck-left   Hyperlipidemia 05/21/2021   Hypothyroid 02/15/2013   Inguinal hernia 10/23/2012   Malignant tumor of kidney ([place]) 09/27/2020   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[place]) 09/27/2020   Multiple lung nodules on CT 05/16/2014   Pain    JOINT PAINS UPPER BODY AFTER RADIATION TREATMENTS   Personal history of malignant neoplasm of unspecified site of lip, oral cavity, and pharynx 09/27/2020   Personal history of other  malignant neoplasm of kidney 09/27/2020   Phlegm in throat 05/13/2012   Pneumonia    Preventative health care 05/16/2014   Preventive measure 05/16/2014   Renal cell carcinoma ([place]) 09/09/2012   Renal mass 03/30/2012   Sensorineural hearing loss, bilateral 09/20/2016   Solitary kidney, acquired 09/29/2020   Solitary pulmonary nodule 09/27/2020   Syncope 09/29/2020   Tinnitus aurium, right 05/29/2020   Tonsil cancer ([place]) 02/2012   SCCa of Left tonsil-S/P biopsy on 02/17/12.    Past Surgical History:  Procedure Laterality Date   DIRECT LARYNGOSCOPY     S/P DL, Biopsy-Dr. Constance Holster. Pathology  postive for SCCa of Left Tonsil   GASTROSTOMY TUBE PLACEMENT  03/02/12   INGUINAL HERNIA REPAIR Left 11/04/2012   Procedure: HERNIA REPAIR INGUINAL ADULT;  Surgeon: Joyice Faster. Cornett, MD;  Location: [facility];  Service: General;  Laterality: Left;   INSERTION OF MESH Left 11/04/2012   Procedure: INSERTION OF MESH;  Surgeon: Joyice Faster. Cornett, MD;  Location: [facility];  Service: General;  Laterality: Left;   LAPAROSCOPIC NEPHRECTOMY  06/29/2012   Procedure: LAPAROSCOPIC NEPHRECTOMY;  Surgeon: Dutch Gray, MD;  Location: [facility];  Service: Urology;  Laterality: Right;   MULTIPLE TOOTH EXTRACTIONS  03/04/12   with DR. Mohorn   PEG TUBE PLACEMENT  06-25-12   remains in place abdomen-not using at present.   PEG TUBE REMOVAL  10/2012   tonsil biopsy  02/17/12   SCCa left tomnsil,HPV positive,spread to l cervical node   WISDOM TOOTH EXTRACTION      MEDICATIONS:  aspirin EC 81 MG tablet   azelastine (ASTELIN) 0.1 % nasal spray   cetirizine (ZYRTEC) 10 MG tablet   clopidogrel (PLAVIX) 75 MG tablet   cyanocobalamin (VITAMIN B12) 1000 MCG/ML injection   levothyroxine (SYNTHROID) 88 MCG tablet   levothyroxine (SYNTHROID) 88 MCG tablet   Multiple Vitamin (MULTIVITAMIN) tablet   pilocarpine (SALAGEN) 5 MG tablet   promethazine-dextromethorphan (PROMETHAZINE-DM) 6.25-15 MG/5ML syrup   rosuvastatin (CRESTOR) 10 MG tablet   valsartan (DIOVAN) 40 MG tablet   No current facility-administered medications for this encounter.    Myra Gianotti, PA-C Surgical Short Stay/Anesthesiology [facility] Phone [phone] [facility] Phone [phone] 08/22/2022 6:13 PM

## 2022-08-30 ENCOUNTER — Other Ambulatory Visit: Payer: Self-pay

## 2022-08-31 ENCOUNTER — Other Ambulatory Visit (HOSPITAL_COMMUNITY): Payer: Self-pay

## 2022-09-05 ENCOUNTER — Inpatient Hospital Stay (HOSPITAL_COMMUNITY): Payer: 59

## 2022-09-05 ENCOUNTER — Other Ambulatory Visit: Payer: Self-pay

## 2022-09-05 ENCOUNTER — Inpatient Hospital Stay (HOSPITAL_COMMUNITY): Payer: 59 | Admitting: Vascular Surgery

## 2022-09-05 ENCOUNTER — Encounter (HOSPITAL_COMMUNITY)
Admission: RE | Disposition: A | Discharge status: Home / Self Care | Payer: Self-pay | Source: Home / Self Care | Attending: Vascular Surgery

## 2022-09-05 ENCOUNTER — Inpatient Hospital Stay (HOSPITAL_COMMUNITY)
Admission: RE | Admit: 2022-09-05 | Discharge: 2022-09-06 | DRG: 036 | Disposition: A | Discharge status: Home / Self Care | Payer: 59 | Attending: Vascular Surgery | Admitting: Vascular Surgery

## 2022-09-05 ENCOUNTER — Encounter (HOSPITAL_COMMUNITY): Payer: Self-pay | Admitting: Vascular Surgery

## 2022-09-05 ENCOUNTER — Inpatient Hospital Stay (HOSPITAL_COMMUNITY): Payer: 59 | Admitting: Anesthesiology

## 2022-09-05 DIAGNOSIS — H903 Sensorineural hearing loss, bilateral: Secondary | ICD-10-CM | POA: Diagnosis present

## 2022-09-05 DIAGNOSIS — Z85528 Personal history of other malignant neoplasm of kidney: Secondary | ICD-10-CM | POA: Diagnosis not present

## 2022-09-05 DIAGNOSIS — I739 Peripheral vascular disease, unspecified: Secondary | ICD-10-CM

## 2022-09-05 DIAGNOSIS — Z85818 Personal history of malignant neoplasm of other sites of lip, oral cavity, and pharynx: Secondary | ICD-10-CM

## 2022-09-05 DIAGNOSIS — Z7902 Long term (current) use of antithrombotics/antiplatelets: Secondary | ICD-10-CM

## 2022-09-05 DIAGNOSIS — I6529 Occlusion and stenosis of unspecified carotid artery: Principal | ICD-10-CM | POA: Diagnosis present

## 2022-09-05 DIAGNOSIS — Z923 Personal history of irradiation: Secondary | ICD-10-CM | POA: Diagnosis not present

## 2022-09-05 DIAGNOSIS — Z79890 Hormone replacement therapy (postmenopausal): Secondary | ICD-10-CM | POA: Diagnosis not present

## 2022-09-05 DIAGNOSIS — Z7982 Long term (current) use of aspirin: Secondary | ICD-10-CM | POA: Diagnosis not present

## 2022-09-05 DIAGNOSIS — E039 Hypothyroidism, unspecified: Secondary | ICD-10-CM

## 2022-09-05 DIAGNOSIS — I6522 Occlusion and stenosis of left carotid artery: Secondary | ICD-10-CM

## 2022-09-05 DIAGNOSIS — Z91018 Allergy to other foods: Secondary | ICD-10-CM | POA: Diagnosis not present

## 2022-09-05 DIAGNOSIS — I10 Essential (primary) hypertension: Secondary | ICD-10-CM

## 2022-09-05 DIAGNOSIS — Z880 Allergy status to penicillin: Secondary | ICD-10-CM | POA: Diagnosis not present

## 2022-09-05 DIAGNOSIS — Z905 Acquired absence of kidney: Secondary | ICD-10-CM | POA: Diagnosis not present

## 2022-09-05 DIAGNOSIS — Z8249 Family history of ischemic heart disease and other diseases of the circulatory system: Secondary | ICD-10-CM

## 2022-09-05 DIAGNOSIS — K219 Gastro-esophageal reflux disease without esophagitis: Secondary | ICD-10-CM | POA: Diagnosis present

## 2022-09-05 DIAGNOSIS — Z8349 Family history of other endocrine, nutritional and metabolic diseases: Secondary | ICD-10-CM

## 2022-09-05 DIAGNOSIS — E785 Hyperlipidemia, unspecified: Secondary | ICD-10-CM | POA: Diagnosis present

## 2022-09-05 DIAGNOSIS — Z79899 Other long term (current) drug therapy: Secondary | ICD-10-CM | POA: Diagnosis not present

## 2022-09-05 DIAGNOSIS — Z808 Family history of malignant neoplasm of other organs or systems: Secondary | ICD-10-CM

## 2022-09-05 HISTORY — DX: Occlusion and stenosis of unspecified carotid artery: I65.29

## 2022-09-05 HISTORY — PX: ULTRASOUND GUIDANCE FOR VASCULAR ACCESS: SHX6516

## 2022-09-05 HISTORY — DX: Occlusion and stenosis of left carotid artery: I65.22

## 2022-09-05 HISTORY — PX: TRANSCAROTID ARTERY REVASCULARIZATIONÂ: SHX6778

## 2022-09-05 LAB — CBC
HCT: 45.4 % (ref 39.0–52.0)
Hemoglobin: 15.7 g/dL (ref 13.0–17.0)
MCH: 31 pg (ref 26.0–34.0)
MCHC: 34.6 g/dL (ref 30.0–36.0)
MCV: 89.5 fL (ref 80.0–100.0)
Platelets: 216 10*3/uL (ref 150–400)
RBC: 5.07 MIL/uL (ref 4.22–5.81)
RDW: 12.2 % (ref 11.5–15.5)
WBC: 7.1 10*3/uL (ref 4.0–10.5)
nRBC: 0 % (ref 0.0–0.2)

## 2022-09-05 LAB — POCT ACTIVATED CLOTTING TIME: Activated Clotting Time: 309 seconds

## 2022-09-05 LAB — CREATININE, SERUM
Creatinine, Ser: 1.28 mg/dL — ABNORMAL HIGH (ref 0.61–1.24)
GFR, Estimated: >60 mL/min (ref >60)

## 2022-09-05 SURGERY — TRANSCAROTID ARTERY REVASCULARIZATION (TCAR)
Anesthesia: General | Site: Neck | Laterality: Right

## 2022-09-05 MED ORDER — PHENYLEPHRINE 80 MCG/ML (10ML) SYRINGE FOR IV PUSH (FOR BLOOD PRESSURE SUPPORT)
PREFILLED_SYRINGE | INTRAVENOUS | Status: AC
  Filled 2022-09-05: qty 10

## 2022-09-05 MED ORDER — LIDOCAINE HCL (PF) 1 % IJ SOLN
INTRAMUSCULAR | Status: AC
  Filled 2022-09-05: qty 30

## 2022-09-05 MED ORDER — LACTATED RINGERS IV SOLN: INTRAVENOUS | Status: DC | PRN

## 2022-09-05 MED ORDER — PHENYLEPHRINE HCL-NACL 20-0.9 MG/250ML-% IV SOLN
INTRAVENOUS | Status: DC | PRN
  Administered 2022-09-05: 50 ug/min via INTRAVENOUS

## 2022-09-05 MED ORDER — ROCURONIUM BROMIDE 10 MG/ML (PF) SYRINGE
PREFILLED_SYRINGE | INTRAVENOUS | Status: AC
  Filled 2022-09-05: qty 10

## 2022-09-05 MED ORDER — MAGNESIUM SULFATE 2 GM/50ML IV SOLN: 2.0000 g | Freq: Every day | INTRAVENOUS | Status: DC | PRN

## 2022-09-05 MED ORDER — HEPARIN SODIUM (PORCINE) 5000 UNIT/ML IJ SOLN
5000.0000 [IU] | Freq: Three times a day (TID) | INTRAMUSCULAR | Status: DC
  Administered 2022-09-06: 5000 [IU] via SUBCUTANEOUS
  Filled 2022-09-05: qty 1

## 2022-09-05 MED ORDER — SODIUM CHLORIDE 0.9 % IV SOLN: INTRAVENOUS | Status: DC

## 2022-09-05 MED ORDER — CLOPIDOGREL BISULFATE 75 MG PO TABS
75.0000 mg | ORAL_TABLET | Freq: Every day | ORAL | Status: DC
  Administered 2022-09-06: 75 mg via ORAL
  Filled 2022-09-05: qty 1

## 2022-09-05 MED ORDER — PHENOL 1.4 % MT LIQD: 1.0000 | OROMUCOSAL | Status: DC | PRN

## 2022-09-05 MED ORDER — PILOCARPINE HCL 5 MG PO TABS
5.0000 mg | ORAL_TABLET | Freq: Three times a day (TID) | ORAL | Status: DC
  Administered 2022-09-05 – 2022-09-06 (×3): 5 mg via ORAL
  Filled 2022-09-05 (×3): qty 1

## 2022-09-05 MED ORDER — DOCUSATE SODIUM 100 MG PO CAPS: 100.0000 mg | ORAL_CAPSULE | Freq: Every day | ORAL | Status: DC

## 2022-09-05 MED ORDER — PROMETHAZINE HCL 25 MG/ML IJ SOLN: 6.2500 mg | INTRAMUSCULAR | Status: DC | PRN

## 2022-09-05 MED ORDER — ORAL CARE MOUTH RINSE: 15.0000 mL | OROMUCOSAL | Status: DC | PRN

## 2022-09-05 MED ORDER — PANTOPRAZOLE SODIUM 40 MG PO TBEC
40.0000 mg | DELAYED_RELEASE_TABLET | Freq: Every day | ORAL | Status: DC
  Administered 2022-09-05: 40 mg via ORAL
  Filled 2022-09-05: qty 1

## 2022-09-05 MED ORDER — LEVOTHYROXINE SODIUM 88 MCG PO TABS
88.0000 ug | ORAL_TABLET | Freq: Every day | ORAL | Status: DC
  Administered 2022-09-05 – 2022-09-06 (×2): 88 ug via ORAL
  Filled 2022-09-05 (×2): qty 1

## 2022-09-05 MED ORDER — 0.9 % SODIUM CHLORIDE (POUR BTL) OPTIME
TOPICAL | Status: DC | PRN
  Administered 2022-09-05: 1000 mL

## 2022-09-05 MED ORDER — ACETAMINOPHEN 650 MG RE SUPP: 325.0000 mg | RECTAL | Status: DC | PRN

## 2022-09-05 MED ORDER — FENTANYL CITRATE (PF) 100 MCG/2ML IJ SOLN: 25.0000 ug | INTRAMUSCULAR | Status: DC | PRN

## 2022-09-05 MED ORDER — VANCOMYCIN HCL IN DEXTROSE 1-5 GM/200ML-% IV SOLN
1000.0000 mg | Freq: Two times a day (BID) | INTRAVENOUS | Status: AC
  Administered 2022-09-05 – 2022-09-06 (×2): 1000 mg via INTRAVENOUS
  Filled 2022-09-05: qty 200

## 2022-09-05 MED ORDER — LABETALOL HCL 5 MG/ML IV SOLN: 10.0000 mg | INTRAVENOUS | Status: DC | PRN

## 2022-09-05 MED ORDER — LIDOCAINE 2% (20 MG/ML) 5 ML SYRINGE
INTRAMUSCULAR | Status: AC
  Filled 2022-09-05: qty 10

## 2022-09-05 MED ORDER — HEMOSTATIC AGENTS (NO CHARGE) OPTIME
TOPICAL | Status: DC | PRN
  Administered 2022-09-05: 1 via TOPICAL

## 2022-09-05 MED ORDER — HEPARIN SODIUM (PORCINE) 1000 UNIT/ML IJ SOLN
INTRAMUSCULAR | Status: DC | PRN
  Administered 2022-09-05: 10000 [IU] via INTRAVENOUS

## 2022-09-05 MED ORDER — METOPROLOL TARTRATE 5 MG/5ML IV SOLN: 2.0000 mg | INTRAVENOUS | Status: DC | PRN

## 2022-09-05 MED ORDER — ONDANSETRON HCL 4 MG/2ML IJ SOLN
INTRAMUSCULAR | Status: AC
  Filled 2022-09-05: qty 2

## 2022-09-05 MED ORDER — MIDAZOLAM HCL 2 MG/2ML IJ SOLN
INTRAMUSCULAR | Status: DC | PRN
  Administered 2022-09-05: 1 mg via INTRAVENOUS

## 2022-09-05 MED ORDER — PROTAMINE SULFATE 10 MG/ML IV SOLN
INTRAVENOUS | Status: AC
  Filled 2022-09-05: qty 5

## 2022-09-05 MED ORDER — EPHEDRINE SULFATE-NACL 50-0.9 MG/10ML-% IV SOSY
PREFILLED_SYRINGE | INTRAVENOUS | Status: DC | PRN
  Administered 2022-09-05: 5 mg via INTRAVENOUS
  Administered 2022-09-05 (×2): 10 mg via INTRAVENOUS
  Administered 2022-09-05: 5 mg via INTRAVENOUS

## 2022-09-05 MED ORDER — ROCURONIUM BROMIDE 10 MG/ML (PF) SYRINGE
PREFILLED_SYRINGE | INTRAVENOUS | Status: DC | PRN
  Administered 2022-09-05: 50 mg via INTRAVENOUS
  Administered 2022-09-05: 20 mg via INTRAVENOUS

## 2022-09-05 MED ORDER — ORAL CARE MOUTH RINSE: 15.0000 mL | Freq: Once | OROMUCOSAL | Status: AC

## 2022-09-05 MED ORDER — ONDANSETRON HCL 4 MG/2ML IJ SOLN
INTRAMUSCULAR | Status: DC | PRN
  Administered 2022-09-05: 4 mg via INTRAVENOUS

## 2022-09-05 MED ORDER — PROTAMINE SULFATE 10 MG/ML IV SOLN
INTRAVENOUS | Status: DC | PRN
  Administered 2022-09-05: 50 mg via INTRAVENOUS

## 2022-09-05 MED ORDER — CHLORHEXIDINE GLUCONATE CLOTH 2 % EX PADS: 6.0000 | MEDICATED_PAD | Freq: Once | CUTANEOUS | Status: DC

## 2022-09-05 MED ORDER — IRBESARTAN 75 MG PO TABS
37.5000 mg | ORAL_TABLET | Freq: Every day | ORAL | Status: DC
  Administered 2022-09-05 – 2022-09-06 (×2): 37.5 mg via ORAL
  Filled 2022-09-05 (×3): qty 0.5

## 2022-09-05 MED ORDER — OXYCODONE-ACETAMINOPHEN 5-325 MG PO TABS: 1.0000 | ORAL_TABLET | Freq: Four times a day (QID) | ORAL | Status: DC | PRN

## 2022-09-05 MED ORDER — DEXAMETHASONE SODIUM PHOSPHATE 10 MG/ML IJ SOLN
INTRAMUSCULAR | Status: DC | PRN
  Administered 2022-09-05: 10 mg via INTRAVENOUS

## 2022-09-05 MED ORDER — FENTANYL CITRATE (PF) 250 MCG/5ML IJ SOLN
INTRAMUSCULAR | Status: DC | PRN
  Administered 2022-09-05: 100 ug via INTRAVENOUS
  Administered 2022-09-05 (×3): 50 ug via INTRAVENOUS

## 2022-09-05 MED ORDER — HYDRALAZINE HCL 20 MG/ML IJ SOLN: 5.0000 mg | INTRAMUSCULAR | Status: DC | PRN

## 2022-09-05 MED ORDER — ASPIRIN 81 MG PO TBEC
81.0000 mg | DELAYED_RELEASE_TABLET | Freq: Every day | ORAL | Status: DC
  Administered 2022-09-06: 81 mg via ORAL
  Filled 2022-09-05: qty 1

## 2022-09-05 MED ORDER — HEPARIN 6000 UNIT IRRIGATION SOLUTION
Status: AC
  Filled 2022-09-05: qty 500

## 2022-09-05 MED ORDER — DEXAMETHASONE SODIUM PHOSPHATE 10 MG/ML IJ SOLN
INTRAMUSCULAR | Status: AC
  Filled 2022-09-05: qty 1

## 2022-09-05 MED ORDER — EPHEDRINE 5 MG/ML INJ
INTRAVENOUS | Status: AC
  Filled 2022-09-05: qty 10

## 2022-09-05 MED ORDER — PHENYLEPHRINE 80 MCG/ML (10ML) SYRINGE FOR IV PUSH (FOR BLOOD PRESSURE SUPPORT)
PREFILLED_SYRINGE | INTRAVENOUS | Status: DC | PRN
  Administered 2022-09-05 (×4): 80 ug via INTRAVENOUS

## 2022-09-05 MED ORDER — ACETAMINOPHEN 325 MG PO TABS: 325.0000 mg | ORAL_TABLET | ORAL | Status: DC | PRN

## 2022-09-05 MED ORDER — LIDOCAINE 2% (20 MG/ML) 5 ML SYRINGE
INTRAMUSCULAR | Status: DC | PRN
  Administered 2022-09-05: 80 mg via INTRAVENOUS

## 2022-09-05 MED ORDER — ALUM & MAG HYDROXIDE-SIMETH 200-200-20 MG/5ML PO SUSP: 15.0000 mL | ORAL | Status: DC | PRN

## 2022-09-05 MED ORDER — MORPHINE SULFATE (PF) 2 MG/ML IV SOLN: 2.0000 mg | INTRAVENOUS | Status: DC | PRN

## 2022-09-05 MED ORDER — MIDAZOLAM HCL 2 MG/2ML IJ SOLN
INTRAMUSCULAR | Status: AC
  Filled 2022-09-05: qty 2

## 2022-09-05 MED ORDER — SODIUM CHLORIDE 0.9 % IV SOLN: 500.0000 mL | Freq: Once | INTRAVENOUS | Status: DC | PRN

## 2022-09-05 MED ORDER — ROSUVASTATIN CALCIUM 5 MG PO TABS: 10.0000 mg | ORAL_TABLET | Freq: Every day | ORAL | Status: DC

## 2022-09-05 MED ORDER — POTASSIUM CHLORIDE CRYS ER 20 MEQ PO TBCR: 20.0000 meq | EXTENDED_RELEASE_TABLET | Freq: Every day | ORAL | Status: DC | PRN

## 2022-09-05 MED ORDER — ONDANSETRON HCL 4 MG/2ML IJ SOLN: 4.0000 mg | Freq: Four times a day (QID) | INTRAMUSCULAR | Status: DC | PRN

## 2022-09-05 MED ORDER — CHLORHEXIDINE GLUCONATE CLOTH 2 % EX PADS
6.0000 | MEDICATED_PAD | Freq: Once | CUTANEOUS | Status: DC
  Administered 2022-09-05: 6 via TOPICAL

## 2022-09-05 MED ORDER — GLYCOPYRROLATE PF 0.2 MG/ML IJ SOSY
PREFILLED_SYRINGE | INTRAMUSCULAR | Status: AC
  Filled 2022-09-05: qty 2

## 2022-09-05 MED ORDER — IODIXANOL 320 MG/ML IV SOLN
INTRAVENOUS | Status: DC | PRN
  Administered 2022-09-05: 25 mL via INTRA_ARTERIAL

## 2022-09-05 MED ORDER — CHLORHEXIDINE GLUCONATE 0.12 % MT SOLN
15.0000 mL | Freq: Once | OROMUCOSAL | Status: AC
  Administered 2022-09-05: 15 mL via OROMUCOSAL
  Filled 2022-09-05: qty 15

## 2022-09-05 MED ORDER — GLYCOPYRROLATE 0.2 MG/ML IJ SOLN
INTRAMUSCULAR | Status: DC | PRN
  Administered 2022-09-05 (×2): .2 mg via INTRAVENOUS

## 2022-09-05 MED ORDER — SUGAMMADEX SODIUM 200 MG/2ML IV SOLN
INTRAVENOUS | Status: DC | PRN
  Administered 2022-09-05: 200 mg via INTRAVENOUS

## 2022-09-05 MED ORDER — LACTATED RINGERS IV SOLN: INTRAVENOUS | Status: DC

## 2022-09-05 MED ORDER — FENTANYL CITRATE (PF) 250 MCG/5ML IJ SOLN
INTRAMUSCULAR | Status: AC
  Filled 2022-09-05: qty 5

## 2022-09-05 MED ORDER — VANCOMYCIN HCL IN DEXTROSE 1-5 GM/200ML-% IV SOLN
1000.0000 mg | INTRAVENOUS | Status: AC
  Administered 2022-09-05: 1000 mg via INTRAVENOUS
  Filled 2022-09-05: qty 200

## 2022-09-05 MED ORDER — HEPARIN 6000 UNIT IRRIGATION SOLUTION
Status: DC | PRN
  Administered 2022-09-05: 1

## 2022-09-05 MED ORDER — GUAIFENESIN-DM 100-10 MG/5ML PO SYRP: 15.0000 mL | ORAL_SOLUTION | ORAL | Status: DC | PRN

## 2022-09-05 MED ORDER — HEPARIN SODIUM (PORCINE) 1000 UNIT/ML IJ SOLN
INTRAMUSCULAR | Status: AC
  Filled 2022-09-05: qty 10

## 2022-09-05 MED ORDER — PROPOFOL 10 MG/ML IV BOLUS
INTRAVENOUS | Status: DC | PRN
  Administered 2022-09-05: 130 mg via INTRAVENOUS

## 2022-09-05 SURGICAL SUPPLY — 47 items
BAG BANDED W/RUBBER/TAPE 36X54 (MISCELLANEOUS) ×2 IMPLANT
BAG COUNTER SPONGE SURGICOUNT (BAG) ×2 IMPLANT
BALLN STERLING RX 4X30X80 (BALLOONS) ×2
BALLOON STERLING RX 4X30X80 (BALLOONS) IMPLANT
CANISTER SUCT 3000ML PPV (MISCELLANEOUS) ×2 IMPLANT
CATH ROBINSON RED A/P 18FR (CATHETERS) IMPLANT
CLIP LIGATING EXTRA MED SLVR (CLIP) ×2 IMPLANT
CLIP LIGATING EXTRA SM BLUE (MISCELLANEOUS) ×2 IMPLANT
COVER DOME SNAP 22 D (MISCELLANEOUS) ×2 IMPLANT
COVER PROBE W GEL 5X96 (DRAPES) ×2 IMPLANT
DERMABOND ADVANCED .7 DNX12 (GAUZE/BANDAGES/DRESSINGS) ×2 IMPLANT
DRAPE FEMORAL ANGIO 80X135IN (DRAPES) ×2 IMPLANT
ELECT REM PT RETURN 9FT ADLT (ELECTROSURGICAL) ×2
ELECTRODE REM PT RTRN 9FT ADLT (ELECTROSURGICAL) ×2 IMPLANT
GLOVE BIO SURGEON STRL SZ7.5 (GLOVE) ×2 IMPLANT
GOWN STRL REUS W/ TWL LRG LVL3 (GOWN DISPOSABLE) ×4 IMPLANT
GOWN STRL REUS W/ TWL XL LVL3 (GOWN DISPOSABLE) ×2 IMPLANT
GOWN STRL REUS W/TWL LRG LVL3 (GOWN DISPOSABLE) ×4
GOWN STRL REUS W/TWL XL LVL3 (GOWN DISPOSABLE) ×2
GUIDEWIRE ENROUTE 0.014 (WIRE) ×2 IMPLANT
INTRODUCER KIT GALT 7CM (INTRODUCER) ×2
KIT BASIN OR (CUSTOM PROCEDURE TRAY) ×2 IMPLANT
KIT ENCORE 26 ADVANTAGE (KITS) ×2 IMPLANT
KIT INTRODUCER GALT 7 (INTRODUCER) ×2 IMPLANT
KIT TURNOVER KIT B (KITS) ×2 IMPLANT
NDL HYPO 25GX1X1/2 BEV (NEEDLE) IMPLANT
NEEDLE HYPO 25GX1X1/2 BEV (NEEDLE) IMPLANT
PACK CAROTID (CUSTOM PROCEDURE TRAY) ×2 IMPLANT
POSITIONER HEAD DONUT 9IN (MISCELLANEOUS) ×2 IMPLANT
POWDER SURGICEL 3.0 GRAM (HEMOSTASIS) IMPLANT
SET MICROPUNCTURE 5F STIFF (MISCELLANEOUS) ×2 IMPLANT
STENT TRANSCAROTID SYSTEM 9X40 (Permanent Stent) IMPLANT
SUT MNCRL AB 4-0 PS2 18 (SUTURE) ×2 IMPLANT
SUT PROLENE 5 0 C 1 24 (SUTURE) ×2 IMPLANT
SUT SILK 2 0 PERMA HAND 18 BK (SUTURE) ×2 IMPLANT
SUT SILK 3 0 (SUTURE)
SUT SILK 3-0 18XBRD TIE 12 (SUTURE) IMPLANT
SUT VIC AB 3-0 SH 27 (SUTURE) ×2
SUT VIC AB 3-0 SH 27X BRD (SUTURE) ×2 IMPLANT
SYR 10ML LL (SYRINGE) ×6 IMPLANT
SYR 20ML LL LF (SYRINGE) ×2 IMPLANT
SYR CONTROL 10ML LL (SYRINGE) IMPLANT
SYSTEM TRANSCAROTID NEUROPRTCT (MISCELLANEOUS) ×2 IMPLANT
TOWEL GREEN STERILE (TOWEL DISPOSABLE) ×2 IMPLANT
TRANSCAROTID NEUROPROTECT SYS (MISCELLANEOUS) ×2
WATER STERILE IRR 1000ML POUR (IV SOLUTION) ×2 IMPLANT
WIRE BENTSON .035X145CM (WIRE) ×2 IMPLANT

## 2022-09-05 NOTE — Transfer of Care (Signed)
Immediate Anesthesia Transfer of Care Note  Patient: Bobby Gonzalez  Procedure(s) Performed: Left Transcarotid Artery Revascularization (Left: Neck) ULTRASOUND GUIDANCE FOR VASCULAR ACCESS RIGHT FEMORAL ARTERY (Right: Groin)  Patient Location: PACU  Anesthesia Type:General  Level of Consciousness: awake, alert , and oriented  Airway & Oxygen Therapy: Patient Spontanous Breathing and Patient connected to nasal cannula oxygen  Post-op Assessment: Report given to RN, Post -op Vital signs reviewed and stable, and Patient moving all extremities X 4  Post vital signs: Reviewed and stable  Last Vitals:  Vitals Value Taken Time  BP 130/82 09/05/22 0931  Temp    Pulse 81 09/05/22 0934  Resp 9 09/05/22 0934  SpO2 100 % 09/05/22 0934  Vitals shown include unvalidated device data.  Last Pain:  Vitals:   09/05/22 0615  TempSrc:   PainSc: 0-No pain         Complications: No notable events documented.

## 2022-09-05 NOTE — Anesthesia Postprocedure Evaluation (Signed)
Anesthesia Post Note  Patient: Bobby Gonzalez  Procedure(s) Performed: Left Transcarotid Artery Revascularization (Left: Neck) ULTRASOUND GUIDANCE FOR VASCULAR ACCESS RIGHT FEMORAL ARTERY (Right: Groin)     Patient location during evaluation: PACU Anesthesia Type: General Level of consciousness: awake and alert Pain management: pain level controlled Vital Signs Assessment: post-procedure vital signs reviewed and stable Respiratory status: spontaneous breathing, nonlabored ventilation, respiratory function stable and patient connected to nasal cannula oxygen Cardiovascular status: blood pressure returned to baseline and stable Postop Assessment: no apparent nausea or vomiting Anesthetic complications: no   No notable events documented.  Last Vitals:  Vitals:   09/05/22 0954 09/05/22 1000  BP:  120/70  Pulse: 76 76  Resp: 12 10  Temp:  36.4 C  SpO2: 99% 95%    Last Pain:  Vitals:   09/05/22 1000  TempSrc:   PainSc: [place]

## 2022-09-05 NOTE — H&P (Signed)
HPI:   Bobby Gonzalez is a 57 y.o. male with history of left carotid stenosis secondary to radiation.  He remains asymptomatic.  He is on aspirin and statin.       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 radiation and chemo 05-13-12(Dr. Lamonte Sakai)   Cardiac murmur 09/29/2020   Carotid atherosclerosis 03/01/2021   Chronic kidney disease      right renal mass 7.3x6.7x6.8cm renal cell ca   Dehydration 05/13/2012   Difficult intubation      POSSIBLE DIFFICULT INTUBATION--S/P RADIATION FOR CANCER LEFT TONSIL--EATING BUT THROAT STILL SORE AND PT HAS THICK MUCUS   Diverticula of colon 03/10/12    multiple rectosigmoid colonic diverticula/ ct abd/pelvis   Dizziness 11/13/2020   Dry mouth 05/16/2014   Essential hypertension 10/11/2019   Gastro-esophageal reflux disease without esophagitis     GERD (gastroesophageal reflux disease)     Hearing loss of right ear 05/29/2020   History of cancer tonsil 09/20/2016   History of head and neck radiation 09/20/2016   History of radiation therapy 03/30/12-05/15/12    left tonsil   HTN (hypertension) 06-25-12    at first start of chemo for squamous cell cancer of neck-left   Hyperlipidemia 05/21/2021   Hypothyroid 02/15/2013   Inguinal hernia 10/23/2012   Malignant tumor of kidney ([place]) 09/27/2020   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[place]) 09/27/2020   Multiple lung nodules on CT 05/16/2014   Pain      JOINT PAINS UPPER BODY AFTER RADIATION TREATMENTS   Personal history of malignant neoplasm of unspecified site of lip, oral cavity, and pharynx 09/27/2020   Personal history of other malignant neoplasm of kidney 09/27/2020   Phlegm in throat 05/13/2012   Preventative health care 05/16/2014   Preventive measure 05/16/2014   Renal cell carcinoma ([place]) 09/09/2012   Renal mass 03/30/2012   Sensorineural hearing loss,  bilateral 09/20/2016   Solitary kidney, acquired 09/29/2020   Solitary pulmonary nodule 09/27/2020   Syncope 09/29/2020   Tinnitus aurium, right 05/29/2020   Tonsil cancer ([place]) 02/2012    SCCa of Left tonsil-S/P biopsy on 02/17/12.         Family History  Problem Relation Age of Onset   Stroke Father     Cancer Father          Prostate   Hypertension Father     Thyroid disease Brother     Hypertension Brother     Throat cancer Brother     Colon cancer Neg Hx     Esophageal cancer Neg Hx     Rectal cancer Neg Hx     Stomach cancer Neg Hx           Past Surgical History:  Procedure Laterality Date   DIRECT LARYNGOSCOPY        S/P DL, Biopsy-Dr. Constance Holster. Pathology postive for SCCa of Left Tonsil   GASTROSTOMY TUBE PLACEMENT   03/02/12   INGUINAL HERNIA REPAIR Left 11/04/2012    Procedure: HERNIA REPAIR INGUINAL ADULT;  Surgeon: Joyice Faster. Cornett, MD;  Location: [facility];  Service: General;  Laterality: Left;   INSERTION OF MESH Left 11/04/2012    Procedure: INSERTION OF MESH;  Surgeon: Joyice Faster. Cornett, MD;  Location: [facility];  Service: General;  Laterality: Left;  LAPAROSCOPIC NEPHRECTOMY   06/29/2012    Procedure: LAPAROSCOPIC NEPHRECTOMY;  Surgeon: Dutch Gray, MD;  Location: [facility];  Service: Urology;  Laterality: Right;   MULTIPLE TOOTH EXTRACTIONS   03/04/12    with DR. Mohorn   PEG TUBE PLACEMENT   06-25-12    remains in place abdomen-not using at present.   PEG TUBE REMOVAL   10/2012   tonsil biopsy   02/17/12    SCCa left tomnsil,HPV positive,spread to l cervical node   WISDOM TOOTH EXTRACTION          Short Social History:  Social History         Tobacco Use   Smoking status: Never   Smokeless tobacco: Never  Substance Use Topics   Alcohol use: No      Comment: Never drank alocohol.          Allergies  Allergen Reactions   Banana Shortness Of Breath   Penicillins              Current Outpatient Medications  Medication Sig Dispense Refill   aspirin EC 81 MG tablet  Take 81 mg by mouth daily.       azelastine (ASTELIN) 0.1 % nasal spray Place 1 spray into both nostrils 2 (two) times daily. Use in each nostril as directed 30 mL 12   cetirizine (ZYRTEC) 10 MG tablet Take 10 mg by mouth daily.       clopidogrel (PLAVIX) 75 MG tablet Take 1 tablet (75 mg total) by mouth daily. 30 tablet 6   cyanocobalamin (VITAMIN B12) 1000 MCG/ML injection Inject 1 mL (1,000 mcg total) into the muscle every 30 (thirty) days. 10 mL 0   levothyroxine (SYNTHROID) 88 MCG tablet TAKE 1 TABLET BY MOUTH DAILY 30 tablet 2   levothyroxine (SYNTHROID) 88 MCG tablet Take 1 tablet (88 mcg total) by mouth daily before breakfast. 90 tablet 3   Multiple Vitamin (MULTIVITAMIN) tablet Take 1 tablet by mouth daily.       pilocarpine (SALAGEN) 5 MG tablet Take 1 tablet by mouth 3 (three) times daily. 90 tablet 3   promethazine-dextromethorphan (PROMETHAZINE-DM) 6.25-15 MG/5ML syrup Take 5 mLs by mouth 4 (four) times daily as needed for cough. 118 mL 0   rosuvastatin (CRESTOR) 10 MG tablet Take 1 tablet (10 mg total) by mouth daily. 90 tablet 2   Syringe/Needle, Disp, (SYRINGE 3CC/25GX1") 25G X 1" 3 ML MISC Use as directed with b12 injections 10 each 1   valsartan (DIOVAN) 40 MG tablet Take 1 tablet (40 mg total) by mouth daily. 90 tablet 3    No current facility-administered medications for this visit.      Review of Systems  Constitutional:  Constitutional negative. HENT:       Left neck pain Eyes: Eyes negative.  Cardiovascular: Cardiovascular negative.  GI: Gastrointestinal negative.  Musculoskeletal: Musculoskeletal negative.  Skin: Skin negative.  Neurological: Neurological negative. Hematologic: Hematologic/lymphatic negative.  Psychiatric: Psychiatric negative.          Objective:    Vitals:   09/05/22 0553 09/05/22 0615  BP: (!) 137/90   Pulse: (!) 50 60  Resp: 18   Temp: 97.9 F (36.6 C)   SpO2: 96% 95%      Physical Exam HENT:     Head: Normocephalic.      Comments: Left neck skin is somewhat tight    Mouth/Throat:     Mouth: Mucous membranes are moist.  Eyes:     Pupils: Pupils are  equal, round, and reactive to light.  Cardiovascular:     Pulses: Normal pulses.  Abdominal:     General: Abdomen is flat.     Palpations: Abdomen is soft.  Musculoskeletal:        General: Normal range of motion.     Right lower leg: No edema.     Left lower leg: No edema.  Skin:    General: Skin is warm.     Capillary Refill: Capillary refill takes less than 2 seconds.  Neurological:     Mental Status: He is alert.  Psychiatric:        Mood and Affect: Mood normal.        Behavior: Behavior normal.        Thought Content: Thought content normal.        Judgment: Judgment normal.        Data: CT IMPRESSION: Unchanged 55-60% proximal left ICA stenosis. New small atheromatous ulceration/excavation at the posterior aspect of the left carotid bulb.      Assessment/Plan:    57 year old male with what appears to be high-grade stenosis of the proximal ICA secondary to radiation.  Plan for left TCAR today in OR.        Antion Andres C. Donzetta Matters, MD [facility] Office: [phone] Pager: [phone]

## 2022-09-05 NOTE — Anesthesia Procedure Notes (Signed)
Procedure Name: Intubation Date/Time: 09/05/2022 7:46 AM  Performed by: Mariea Clonts, CRNAPre-anesthesia Checklist: Patient identified, Emergency Drugs available, Suction available and Patient being monitored Patient Re-evaluated:Patient Re-evaluated prior to induction Oxygen Delivery Method: Circle System Utilized Preoxygenation: Pre-oxygenation with 100% oxygen Induction Type: IV induction Ventilation: Mask ventilation without difficulty Laryngoscope Size: Glidescope and 4 Grade View: Grade II Tube type: Oral Tube size: 7.5 mm Number of attempts: 1 Airway Equipment and Method: Stylet and Oral airway Placement Confirmation: ETT inserted through vocal cords under direct vision, positive ETCO2 and breath sounds checked- equal and bilateral Tube secured with: Tape Dental Injury: Teeth and Oropharynx as per pre-operative assessment

## 2022-09-05 NOTE — Anesthesia Procedure Notes (Signed)
Arterial Line Insertion Start/End1/12/2022 7:15 AM, 09/05/2022 7:20 AM Performed by: CRNA  Patient location: Pre-op. Preanesthetic checklist: patient identified, IV checked, site marked, risks and benefits discussed, surgical consent, monitors and equipment checked, pre-op evaluation, timeout performed and anesthesia consent Lidocaine 1% used for infiltration Right, radial was placed Catheter size: 20 G Hand hygiene performed  and maximum sterile barriers used   Attempts: 1 Procedure performed without using ultrasound guided technique. Following insertion, dressing applied and Biopatch. Post procedure assessment: normal  Post procedure complications: local hematoma. Patient tolerated the procedure well with no immediate complications.

## 2022-09-05 NOTE — Op Note (Signed)
Patient name: BLAYDE BACIGALUPI        MRN: [id number]        DOB: 1966/04/06          Sex: male   1/4/202 Pre-operative Diagnosis: asymptomatic left ica stenosis Post-operative diagnosis:  Same Surgeon:  Erlene Quan C. Donzetta Matters, MD Assistant: Vicente Serene, PA Procedure Performed: 1.  Ultrasound-guided cannulation right common femoral vein for placement 8 [place] return sheath 2.  Left transcarotid artery stenting 9 x 40 mm EnRoute stent with flow reversal neuro protection   Indications: 57 year old male with history of neck irradiation on the left with stenosis of the distal cervical ICA now indicated for transcarotid artery stenting.   Findings: There was a very tight stenosis in the distal cervical ICA and a small outpouching of the proximal ICA.  The stent landed just at the takeoff of the ICA and ECA was patent.  I elected not to stent further down into the left common carotid artery given the small size of the common carotid.              Procedure:  The patient was identified in the holding area and taken to the operating room where general anesthesia was induced.  The extremity draped in the left neck bilateral groins fashion, antibiotics were minister, was called.  Ultrasound was used to identify the right common femoral vein this was patent and compressible.  The vein was then cannulated with micropuncture needle followed by wire and sheath.  I placed a Bentson wire followed by an 8 [place] sheath.  This was affixed to the skin with silk suture.  Attention was then turned to the left neck.  Vertical incision was made between the 2 of the sternocleidomastoid.  Dissection first of the clavicle in between the 2 incisions approximated with a management plan identified J.  There was significant scar tissue there.  This was retracted carefully laterally.  I dissected deeper to The Pulm Artery and This Was Encircled with Vessel Loop and Umbilical Tape and the Patient Was Fully Heparinized and ACT  Returned Greater Than 100, I Placed a U-Stitch 5-0 ProleneThe Common Carotid Artery Distally in the Wound Bed and Then Cannulated This with Micropuncture Needle Followed by Wire and Sheath to 3 Cm.  I Then Performed Angiogram Identified the Bifurcation.  I Then Placed a Stiff J-Wire up to the Level of the Bifurcation and Then Placed the 8 French Flow Reversal Sheath and This Was Affixed to the Places after ACT Was Confirmed Flow Reversal Was Then Initiated and Confirmed.  After ACT was confirmed we then clamped the common carotid artery and and crossed the lesion with a 1 4 wire this was primarily balloon with a 4 x 30 mm balloon and then primarily stented with a 9 x 40 stent.  After 2 minutes of washout performed angiography in 2 views which demonstrated 0% residual stenosis.  I elected not to stent further into the common carotid artery.  The wire was removed clamp was removed and flow reversal discontinued after total of 8 minutes.  We then removed the sheath and sensor for the 0 Prolene suture.  Doppler confirmed strong flow within the common carotid artery.  50 mg of protamine ministered and the sheath in the right groin pressure was held obtained.  We then obtained hemostasis in the left neck incision irrigated this wound and closed the platysma with skin with Monocryl.  Dermabond placed at the skin level.  Patient was  awakened from anesthesia and was noted to be neurologically intact and was transferred to recovery area in stable condition.  All counts were correct at completion.  EBL: 50 cc  Contrast: 25 cc       Adekunle Rohrbach C. Donzetta Matters, MD [facility] Office: [phone] Pager: [phone]

## 2022-09-05 NOTE — Discharge Instructions (Signed)
   [facility]  Discharge Instructions   Carotid Surgery  Please refer to the following instructions for your post-procedure care. Your surgeon or physician assistant will discuss any changes with you.  Activity  You are encouraged to walk as much as you can. You can slowly return to normal activities but must avoid strenuous activity and heavy lifting until your doctor tell you it's okay. Avoid activities such as vacuuming or swinging a golf club. You can drive after one week if you are comfortable and you are no longer taking prescription pain medications. It is normal to feel tired for serval weeks after your surgery. It is also normal to have difficulty with sleep habits, eating, and bowel movements after surgery. These will go away with time.  Bathing/Showering  Shower daily after you go home. Do not soak in a bathtub, hot tub, or swim until the incision heals completely.  Incision Care  Shower every day. Clean your incision with mild soap and water. Pat the area dry with a clean towel. You do not need a bandage unless otherwise instructed. Do not apply any ointments or creams to your incision. You may have skin glue on your incision. Do not peel it off. It will come off on its own in about one week. Your incision may feel thickened and raised for several weeks after your surgery. This is normal and the skin will soften over time.   For Men Only: It's okay to shave around the incision but do not shave the incision itself for 2 weeks. It is common to have numbness under your chin that could last for several months.  Diet  Resume your normal diet. There are no special food restrictions following this procedure. A low fat/low cholesterol diet is recommended for all patients with vascular disease. In order to heal from your surgery, it is CRITICAL to get adequate nutrition. Your body requires vitamins, minerals, and protein. Vegetables are the best source of  vitamins and minerals. Vegetables also provide the perfect balance of protein. Processed food has little nutritional value, so try to avoid this.  Medications  Resume taking all of your medications unless your doctor or physician assistant tells you not to. If your incision is causing pain, you may take over-the- counter pain relievers such as acetaminophen (Tylenol). If you were prescribed a stronger pain medication, please be aware these medications can cause nausea and constipation. Prevent nausea by taking the medication with a snack or meal. Avoid constipation by drinking plenty of fluids and eating foods with a high amount of fiber, such as fruits, vegetables, and grains.   Do not take Tylenol if you are taking prescription pain medications.  Follow Up  Our office will schedule a follow up appointment 2-3 weeks following discharge.  Please call us immediately for any of the following conditions  . Increased pain, redness, drainage (pus) from your incision site. . Fever of 101 degrees or higher. . If you should develop stroke (slurred speech, difficulty swallowing, weakness on one side of your body, loss of vision) you should call 911 and go to the nearest emergency room. .  Reduce your risk of vascular disease:  . Stop smoking. If you would like help call [facility] at 1-800-QUIT-NOW (1-800-784-8669) or [facility] at 336-586-4000. . Manage your cholesterol . Maintain a desired weight . Control your diabetes . Keep your blood pressure down .  If you have any questions, please call the office at [phone]. 

## 2022-09-06 ENCOUNTER — Encounter (HOSPITAL_COMMUNITY): Payer: Self-pay | Admitting: Vascular Surgery

## 2022-09-06 LAB — CBC
HCT: 42.1 % (ref 39.0–52.0)
Hemoglobin: 14.5 g/dL (ref 13.0–17.0)
MCH: 30.7 pg (ref 26.0–34.0)
MCHC: 34.4 g/dL (ref 30.0–36.0)
MCV: 89 fL (ref 80.0–100.0)
Platelets: 261 10*3/uL (ref 150–400)
RBC: 4.73 MIL/uL (ref 4.22–5.81)
RDW: 12.2 % (ref 11.5–15.5)
WBC: 13.8 10*3/uL — ABNORMAL HIGH (ref 4.0–10.5)
nRBC: 0 % (ref 0.0–0.2)

## 2022-09-06 LAB — LIPID PANEL
Cholesterol: 88 mg/dL (ref 0–200)
HDL: 40 mg/dL — ABNORMAL LOW (ref >40)
LDL Cholesterol: 35 mg/dL (ref 0–99)
Total CHOL/HDL Ratio: 2.2 RATIO
Triglycerides: 67 mg/dL (ref <150)
VLDL: 13 mg/dL (ref 0–40)

## 2022-09-06 LAB — BASIC METABOLIC PANEL
Anion gap: 9 (ref 5–15)
BUN: 13 mg/dL (ref 6–20)
CO2: 23 mmol/L (ref 22–32)
Calcium: 8.3 mg/dL — ABNORMAL LOW (ref 8.9–10.3)
Chloride: 104 mmol/L (ref 98–111)
Creatinine, Ser: 1.09 mg/dL (ref 0.61–1.24)
GFR, Estimated: >60 mL/min (ref >60)
Glucose, Bld: 114 mg/dL — ABNORMAL HIGH (ref 70–99)
Potassium: 3.8 mmol/L (ref 3.5–5.1)
Sodium: 136 mmol/L (ref 135–145)

## 2022-09-06 NOTE — Progress Notes (Addendum)
  Progress Note    09/06/2022 7:48 AM 1 Day Post-Op  Subjective:  no neuro events overnight.  Swallowing without difficulty   Vitals:   09/06/22 0409 09/06/22 0733  BP: 114/63 118/77  Pulse: 60 (!) 59  Resp: 12 19  Temp: 98.4 F (36.9 C) 98 F (36.7 C)  SpO2: 97% 99%   Physical Exam: Lungs:  non labored Incisions:  L neck and R groin c/d/i Extremities:  moving all extremities well Neurologic: A&O; CN grossly intact  CBC    Component Value Date/Time   WBC 13.8 (H) 09/06/2022 0435   RBC 4.73 09/06/2022 0435   HGB 14.5 09/06/2022 0435   HGB 15.8 05/17/2015 0818   HCT 42.1 09/06/2022 0435   HCT 46.4 05/17/2015 0818   PLT 261 09/06/2022 0435   PLT 176 05/17/2015 0818   MCV 89.0 09/06/2022 0435   MCV 91.0 05/17/2015 0818   MCH 30.7 09/06/2022 0435   MCHC 34.4 09/06/2022 0435   RDW 12.2 09/06/2022 0435   RDW 12.7 05/17/2015 0818   LYMPHSABS 0.9 08/05/2022 1347   LYMPHSABS 0.6 (L) 05/17/2015 0818   MONOABS 0.6 08/05/2022 1347   MONOABS 0.4 05/17/2015 0818   EOSABS 0.3 08/05/2022 1347   EOSABS 0.2 05/17/2015 0818   BASOSABS 0.0 08/05/2022 1347   BASOSABS 0.0 05/17/2015 0818    BMET    Component Value Date/Time   NA 136 09/06/2022 0435   NA 138 10/04/2021 0844   NA 140 02/10/2017 0909   K 3.8 09/06/2022 0435   K 4.7 02/10/2017 0909   CL 104 09/06/2022 0435   CL 106 02/12/2013 1414   CO2 23 09/06/2022 0435   CO2 28 02/10/2017 0909   GLUCOSE 114 (H) 09/06/2022 0435   GLUCOSE 98 02/10/2017 0909   GLUCOSE 119 (H) 02/12/2013 1414   BUN 13 09/06/2022 0435   BUN 19 10/04/2021 0844   BUN 13.4 02/10/2017 0909   CREATININE 1.09 09/06/2022 0435   CREATININE 1.12 05/15/2020 0929   CREATININE 1.2 02/10/2017 0909   CALCIUM 8.3 (L) 09/06/2022 0435   CALCIUM 9.2 02/10/2017 0909   GFRNONAA >60 09/06/2022 0435   GFRNONAA >60 02/11/2018 1023   GFRNONAA 71 01/23/2015 1651   GFRAA >60 02/11/2018 1023   GFRAA 82 01/23/2015 1651    INR    Component Value Date/Time    INR 1.0 08/22/2022 1112     Intake/Output Summary (Last 24 hours) at 09/06/2022 0748 Last data filed at 09/05/2022 1500 Gross per 24 hour  Intake 1598.06 ml  Output 250 ml  Net 1348.06 ml     Assessment/Plan:  57 y.o. male is s/p L TCAR 1 Day Post-Op   Neuro exam remains at baseline L neck incision without hematoma Plavix, asa, and statin Ok for discharge home; office will arrange duplex in 1 month   Dagoberto Ligas, PA-C [facility] [phone] 09/06/2022 7:48 AM  I have independently interviewed and examined patient and agree with PA assessment and plan above.  Patient without issues as noted above and okay for discharge today on aspirin, Plavix and statin.  Ideally would have Plavix on board for at least 6 weeks postoperatively prior to any discontinuation for hernia surgery and I discussed this with the patient he demonstrates good understanding.  I will see him back in 1 month at which time we can have further discussion regarding future operations.  Shamanda Len C. Donzetta Matters, MD [facility] Office: [phone] Pager: [phone]

## 2022-09-06 NOTE — Progress Notes (Signed)
PHARMACIST LIPID MONITORING   Bobby Gonzalez is a 57 y.o. male admitted on 09/05/2022 with left asymptomatic ICA stenosis s/p stenting.  Pharmacy has been consulted to optimize lipid-lowering therapy with the indication of secondary prevention for clinical ASCVD.  Recent Labs:  Lipid Panel (last 6 months):   Lab Results  Component Value Date   CHOL 88 09/06/2022   TRIG 67 09/06/2022   HDL 40 (L) 09/06/2022   CHOLHDL 2.2 09/06/2022   VLDL 13 09/06/2022   LDLCALC 35 09/06/2022    Hepatic function panel (last 6 months):   Lab Results  Component Value Date   AST 25 08/22/2022   ALT 20 08/22/2022   ALKPHOS 58 08/22/2022   BILITOT 0.6 08/22/2022    SCr (since admission):   Serum creatinine: 1.09 mg/dL 09/06/22 0435 Estimated creatinine clearance: 79.1 mL/min  Current therapy and lipid therapy tolerance Current lipid-lowering therapy: Crestor 10 mg daily Previous lipid-lowering therapies (if applicable): N/A Documented or reported allergies or intolerances to lipid-lowering therapies (if applicable): none noted in EMR  Assessment:   Patient prefers no changes in lipid-lowering therapy at this time due to LDL is below goal (35 mg/dL)  Plan:    1.Statin intensity (high intensity recommended for all patients regardless of the LDL):  No statin changes. The patient is already on a high intensity statin.  2.Add ezetimibe (if any one of the following):   Not indicated at this time.  3.Refer to lipid clinic:   No  4.Follow-up with:  Primary care provider - Carollee Herter, [place], DO  5.Follow-up labs after discharge:  No changes in lipid therapy, repeat a lipid panel in one year.       Vaughan Basta BS, PharmD, BCPS Clinical Pharmacist 09/06/2022 9:13 AM  Contact: [phone] after 3 PM  "Be curious, not judgmental..." -Jamal Maes

## 2022-09-06 NOTE — Discharge Summary (Signed)
Discharge Summary     Bobby Gonzalez 1966-04-30 57 y.o. male  [id number]  Admission Date: 09/05/2022  Discharge Date: 09/06/22  Physician: Dr. Donzetta Matters  Admission Diagnosis: Carotid stenosis [I65.29] Asymptomatic carotid artery stenosis, left [I65.22]  Discharge Day services:   See progress note 09/06/2022  Hospital Course:  Bobby Gonzalez is a 57 year old male who underwent left-sided transcarotid artery revascularization on 09/05/2022 by Dr. Donzetta Matters due to high-grade asymptomatic stenosis of the left ICA.  He tolerated the procedure well and was admitted to the hospital postoperatively.  POD #1 he did not experience any strokelike symptoms since his surgery.  He is ambulating well and ready for discharge home.  Left neck incision appears to be healing well without any hematoma.  He will continue his aspirin, Plavix, statin daily.  He will follow-up in office in 1 month with a carotid duplex.  He did not require any narcotic pain medication postoperatively thus he will not be provided a prescription.  Discharge instructions were reviewed with the patient and his wife and they voiced understanding.  He was discharged home in stable condition.   Recent Labs    09/06/22 0435  NA 136  K 3.8  CL 104  CO2 23  GLUCOSE 114*  BUN 13  CALCIUM 8.3*   Recent Labs    09/05/22 1132 09/06/22 0435  WBC 7.1 13.8*  HGB 15.7 14.5  HCT 45.4 42.1  PLT 216 261   No results for input(s): "INR" in the last 72 hours.     Discharge Diagnosis:  Carotid stenosis [I65.29] Asymptomatic carotid artery stenosis, left [I65.22]  Secondary Diagnosis: Patient Active Problem List   Diagnosis Date Noted   Carotid stenosis 09/05/2022   Asymptomatic carotid artery stenosis, left 09/05/2022   Other fatigue 03/04/2022   Viral upper respiratory tract infection 03/04/2022   Hyperlipidemia 05/21/2021   Carotid atherosclerosis 03/01/2021   GERD (gastroesophageal reflux disease)    Dizziness 11/13/2020    Syncope 09/29/2020   Solitary kidney, acquired 09/29/2020   Cardiac murmur 09/29/2020   Malignant tumor of kidney ([place]) 09/27/2020   Personal history of malignant neoplasm of unspecified site of lip, oral cavity, and pharynx 09/27/2020   Personal history of other malignant neoplasm of kidney 09/27/2020   Acquired hypothyroidism 09/27/2020   Solitary pulmonary nodule 09/27/2020   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[place]) 09/27/2020   Cancer ([place])    Difficult intubation    Gastro-esophageal reflux disease without esophagitis    History of radiation therapy    Pain    Hearing loss of right ear 05/29/2020   Tinnitus aurium, right 05/29/2020   Essential hypertension 10/11/2019   History of cancer tonsil 09/20/2016   History of head and neck radiation 09/20/2016   Sensorineural hearing loss, bilateral 09/20/2016   Multiple lung nodules on CT 05/16/2014   Dry mouth 05/16/2014   Preventative health care 05/16/2014   Preventive measure 05/16/2014   Hypothyroid 02/15/2013   Inguinal hernia 10/23/2012   Renal cell carcinoma ([place]) 09/09/2012   HTN (hypertension) 06/25/2012   Dehydration 05/13/2012   Phlegm in throat 05/13/2012   Renal mass 03/30/2012   Allergy    Chronic kidney disease    Diverticula of colon 03/10/2012   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 radiation  and chemo 05-13-12(Dr. Lamonte Sakai)   Cardiac murmur 09/29/2020   Carotid atherosclerosis 03/01/2021   Chronic kidney disease    right renal mass 7.3x6.7x6.8cm renal cell ca   Dehydration 05/13/2012   Difficult intubation    POSSIBLE DIFFICULT INTUBATION--S/P RADIATION FOR CANCER LEFT TONSIL--EATING BUT THROAT STILL SORE AND PT HAS THICK MUCUS   Diverticula of colon 03/10/2012   multiple rectosigmoid colonic diverticula/ ct  abd/pelvis   Dizziness 11/13/2020   Dry mouth 05/16/2014   Essential hypertension 10/11/2019   Gastro-esophageal reflux disease without esophagitis    GERD (gastroesophageal reflux disease)    Hearing loss of right ear 05/29/2020   History of cancer tonsil 09/20/2016   History of head and neck radiation 09/20/2016   History of radiation therapy 03/30/12-05/15/12   left tonsil   HTN (hypertension) 06/25/2012   at first start of chemo for squamous cell cancer of neck-left   Hyperlipidemia 05/21/2021   Hypothyroid 02/15/2013   Inguinal hernia 10/23/2012   Inguinal hernia    right   Malignant tumor of kidney ([place]) 09/27/2020   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) 09/27/2020   Multiple lung nodules on CT 05/16/2014   Pain    JOINT PAINS UPPER BODY AFTER RADIATION TREATMENTS   Personal history of malignant neoplasm of unspecified site of lip, oral cavity, and pharynx 09/27/2020   Personal history of other malignant neoplasm of kidney 09/27/2020   Phlegm in throat 05/13/2012   Pneumonia    Preventative health care 05/16/2014   Preventive measure 05/16/2014   Renal cell carcinoma ([place]) 09/09/2012   Renal mass 03/30/2012   Sensorineural hearing loss, bilateral 09/20/2016   Solitary kidney, acquired 09/29/2020   Solitary pulmonary nodule 09/27/2020   Syncope 09/29/2020   Tinnitus aurium, right 05/29/2020   Tonsil cancer ([place]) 02/2012   SCCa of Left tonsil-S/P biopsy on 02/17/12.    Allergies as of 09/06/2022       Reactions   Banana Shortness Of Breath, Rash, Other (See Comments)   Sneezing    Penicillins Other (See Comments)   Unknown per Pt         Medication List     TAKE these medications    aspirin EC 81 MG tablet Take 81 mg by mouth daily.   azelastine 0.1 % nasal spray Commonly known as: ASTELIN Place 1 spray into both nostrils 2 (two) times daily. Use in each nostril as directed   cetirizine 10 MG tablet Commonly known as: ZYRTEC Take 10 mg by mouth daily.    clopidogrel 75 MG tablet Commonly known as: PLAVIX Take 1 tablet (75 mg total) by mouth daily.   cyanocobalamin 1000 MCG/ML injection Commonly known as: VITAMIN B12 Inject 1 mL (1,000 mcg total) into the muscle every 30 (thirty) days.   levothyroxine 88 MCG tablet Commonly known as: SYNTHROID TAKE 1 TABLET BY MOUTH DAILY What changed: how much to take   levothyroxine 88 MCG tablet Commonly known as: SYNTHROID Take 1 tablet (88 mcg total) by mouth daily before breakfast. What changed: Another medication with the same name was changed. Make sure you understand how and when to take each.   multivitamin tablet Take 1 tablet by mouth daily.   pilocarpine 5 MG tablet Commonly known as: SALAGEN Take 1 tablet by mouth 3 (three) times daily.   promethazine-dextromethorphan 6.25-15 MG/5ML syrup Commonly known as: PROMETHAZINE-DM Take 5 mLs by mouth 4 (four) times daily as needed for cough.   rosuvastatin 10 MG tablet Commonly known as: CRESTOR Take  1 tablet (10 mg total) by mouth daily.   valsartan 40 MG tablet Commonly known as: Diovan Take 1 tablet (40 mg total) by mouth daily.         Discharge Instructions:   [facility] Discharge Instructions Carotid Endarterectomy (CEA)  Please refer to the following instructions for your post-procedure care. Your surgeon or physician assistant will discuss any changes with you.  Activity  You are encouraged to walk as much as you can. You can slowly return to normal activities but must avoid strenuous activity and heavy lifting until your doctor tell you it's OK. Avoid activities such as vacuuming or swinging a golf club. You can drive after one week if you are comfortable and you are no longer taking prescription pain medications. It is normal to feel tired for serval weeks after your surgery. It is also normal to have difficulty with sleep habits, eating, and bowel movements after surgery. These  will go away with time.  Bathing/Showering  You may shower after you come home. Do not soak in a bathtub, hot tub, or swim until the incision heals completely.  Incision Care  Shower every day. Clean your incision with mild soap and water. Pat the area dry with a clean towel. You do not need a bandage unless otherwise instructed. Do not apply any ointments or creams to your incision. You may have skin glue on your incision. Do not peel it off. It will come off on its own in about one week. Your incision may feel thickened and raised for several weeks after your surgery. This is normal and the skin will soften over time. For Men Only: It's OK to shave around the incision but do not shave the incision itself for 2 weeks. It is common to have numbness under your chin that could last for several months.  Diet  Resume your normal diet. There are no special food restrictions following this procedure. A low fat/low cholesterol diet is recommended for all patients with vascular disease. In order to heal from your surgery, it is CRITICAL to get adequate nutrition. Your body requires vitamins, minerals, and protein. Vegetables are the best source of vitamins and minerals. Vegetables also provide the perfect balance of protein. Processed food has little nutritional value, so try to avoid this.  Medications  Resume taking all of your medications unless your doctor or physician assistant tells you not to.  If your incision is causing pain, you may take over-the- counter pain relievers such as acetaminophen (Tylenol). If you were prescribed a stronger pain medication, please be aware these medications can cause nausea and constipation.  Prevent nausea by taking the medication with a snack or meal. Avoid constipation by drinking plenty of fluids and eating foods with a high amount of fiber, such as fruits, vegetables, and grains. Do not take Tylenol if you are taking prescription pain medications.  Follow  Up  Our office will schedule a follow up appointment 2-3 weeks following discharge.  Please call us immediately for any of the following conditions  Increased pain, redness, drainage (pus) from your incision site. Fever of 101 degrees or higher. If you should develop stroke (slurred speech, difficulty swallowing, weakness on one side of your body, loss of vision) you should call 911 and go to the nearest emergency room.  Reduce your risk of vascular disease:  Stop smoking. If you would like help call [facility] at 1-800-QUIT-NOW 585-394-2461) or [place] at (803)637-7528. Manage your cholesterol  Maintain a desired weight Control your diabetes Keep your blood pressure down  If you have any questions, please call the office at [phone].   Disposition: home  Patient's condition: is Good  Follow up: 1. [facility] 4 weeks.   Dagoberto Ligas, PA-C [facility] [phone]   --- For [facility] Registry use ---   Modified Rankin score at D/C (0-6): 0  IV medication needed for:  1. Hypertension: No 2. Hypotension: No  Post-op Complications: No  1. Post-op CVA or TIA: No  If yes: Event classification (right eye, left eye, right cortical, left cortical, verterobasilar, other):   If yes: Timing of event (intra-op, <6 hrs post-op, >=6 hrs post-op, unknown):   2. CN injury: No  If yes: CN  injuried   3. Myocardial infarction: No  If yes: Dx by (EKG or clinical, Troponin):   4.  CHF: No  5.  Dysrhythmia (new): No  6. Wound infection: No  7. Reperfusion symptoms: No  8. Return to OR: No  If yes: return to OR for (bleeding, neurologic, other CEA incision, other):   Discharge medications: Statin use:  Yes ASA use:  Yes   Beta blocker use:  No ACE-Inhibitor use:  No  ARB use:  Yes CCB use: No P2Y12 Antagonist use: Yes, [ x] Plavix, '[ ]'$  Plasugrel, '[ ]'$  Ticlopinine, '[ ]'$  Ticagrelor, '[ ]'$  Other, '[ ]'$  No for medical reason, '[ ]'$  Non-compliant, '[ ]'$   Not-indicated Anti-coagulant use:  No, '[ ]'$  Warfarin, '[ ]'$  Rivaroxaban, '[ ]'$  Dabigatran,

## 2022-09-11 ENCOUNTER — Other Ambulatory Visit (HOSPITAL_COMMUNITY): Payer: Self-pay

## 2022-09-11 ENCOUNTER — Other Ambulatory Visit: Payer: Self-pay | Admitting: Cardiology

## 2022-09-11 MED ORDER — VALSARTAN 40 MG PO TABS
40.0000 mg | ORAL_TABLET | Freq: Every day | ORAL | 0 refills | Status: DC
  Filled 2022-09-11: qty 30, 30d supply, fill #0

## 2022-09-12 ENCOUNTER — Other Ambulatory Visit (HOSPITAL_COMMUNITY): Payer: Self-pay

## 2022-09-18 ENCOUNTER — Other Ambulatory Visit: Payer: Self-pay | Admitting: *Deleted

## 2022-09-18 DIAGNOSIS — I6522 Occlusion and stenosis of left carotid artery: Secondary | ICD-10-CM

## 2022-09-27 ENCOUNTER — Other Ambulatory Visit (HOSPITAL_COMMUNITY): Payer: Self-pay

## 2022-10-01 NOTE — Progress Notes (Unsigned)
  HISTORY AND PHYSICAL     CC:  follow up. Requesting Provider:  Carollee Herter, Alferd Apa, *  HPI: This is a 57 y.o. male here for follow up for carotid artery stenosis.  Pt is s/p left TCAR for asymptomatic carotid artery stenosis (secondary to radiation) on 09/05/2022 by Dr. Donzetta Matters.    Pt returns today for follow up.    Pt denies any amaurosis fugax, speech difficulties, weakness, numbness, paralysis or clumsiness or facial droop.   He is pleased with how his incision has healed.  He states it had a sharp stitch but this has gone away. He states that he was having dizziness with turning his head to the left prior to surgery but this has improved.      PHYSICAL EXAMINATION:  Today's Vitals   10/02/22 0954  BP: 126/86  Pulse: (!) 57  Temp: 97.7 F (36.5 C)  TempSrc: Temporal  SpO2: 99%  Height: 6' (1.829 m)   Body mass index is 25 kg/m.   General:  WDWN in NAD; vital signs documented above Gait: Not observed Pulmonary: normal non-labored breathing Neuro:  in tact Incision:  left neck incision has healed niced.    Non-Invasive Vascular Imaging:   Carotid Duplex on 10/02/2022 Summary:  Right Carotid: Elevated velocities in the distal CCA and proximal ICA without   evidence of significant plaque morphology to support stenosis.   Left Carotid: Hemodynamically significant plaque >50% visualized in the proximal   CCA. Patent left ICA stent without evidence of stenosis.   Vertebrals:  Bilateral vertebral arteries demonstrate antegrade flow.  Subclavians: Right subclavian artery was stenotic. Normal flow hemodynamics were  seen in the left subclavian artery.   Previous Carotid duplex on 06/12/2022: Right: 1-39% ICA stenosis Left:   80-99% ICA stenosis    ASSESSMENT/PLAN:: 57 y.o. male here for follow up carotid artery stenosis and hx of  left TCAR for asymptomatic carotid artery stenosis on 09/05/2022 by Dr. Donzetta Matters.   -duplex today reveals left ICA stent is patent without  stenosis and a plaque is present in the left CCA. and the right ICA and CCA are without significant plaque. Discussed duplex findings with Dr. Donzetta Matters and given pt is asymptomatic, will have him return in 9 months with duplex. -discussed with pt he is okay to slowly resume regular activities.  Discussed waiting another couple of weeks before getting massage to let incision fully heal.  His incision looks great today. -discussed s/s of stroke with pt and he understands should he develop any of these sx, he will go to the nearest ER or call 911. -pt will f/u in 9 months with carotid duplex -pt will call sooner should he have any issues. -continue statin/asa/plavix   Leontine Locket, [facility] [phone]  Clinic MD:  Donzetta Matters

## 2022-10-02 ENCOUNTER — Ambulatory Visit (HOSPITAL_COMMUNITY)
Admission: RE | Admit: 2022-10-02 | Discharge: 2022-10-02 | Disposition: A | Discharge status: Home / Self Care | Payer: 59 | Source: Ambulatory Visit | Attending: Vascular Surgery | Admitting: Vascular Surgery

## 2022-10-02 ENCOUNTER — Other Ambulatory Visit: Payer: Self-pay

## 2022-10-02 ENCOUNTER — Encounter: Payer: Self-pay | Admitting: Physician Assistant

## 2022-10-02 ENCOUNTER — Ambulatory Visit (INDEPENDENT_AMBULATORY_CARE_PROVIDER_SITE_OTHER): Payer: 59 | Admitting: Physician Assistant

## 2022-10-02 VITALS — BP 126/86 | HR 57 | Temp 97.7°F | Ht 72.0 in

## 2022-10-02 DIAGNOSIS — I6522 Occlusion and stenosis of left carotid artery: Secondary | ICD-10-CM | POA: Diagnosis not present

## 2022-10-17 ENCOUNTER — Other Ambulatory Visit (HOSPITAL_COMMUNITY): Payer: Self-pay

## 2022-10-29 ENCOUNTER — Other Ambulatory Visit (HOSPITAL_COMMUNITY): Payer: Self-pay

## 2022-10-29 ENCOUNTER — Other Ambulatory Visit: Payer: Self-pay | Admitting: Cardiology

## 2022-10-29 MED ORDER — VALSARTAN 40 MG PO TABS
40.0000 mg | ORAL_TABLET | Freq: Every day | ORAL | 0 refills | Status: DC
  Filled 2022-10-29: qty 15, 15d supply, fill #0

## 2022-11-09 ENCOUNTER — Other Ambulatory Visit (HOSPITAL_COMMUNITY): Payer: Self-pay

## 2022-11-09 ENCOUNTER — Other Ambulatory Visit: Payer: Self-pay | Admitting: Cardiology

## 2022-11-11 ENCOUNTER — Other Ambulatory Visit (HOSPITAL_COMMUNITY): Payer: Self-pay

## 2022-11-11 MED ORDER — VALSARTAN 40 MG PO TABS
40.0000 mg | ORAL_TABLET | Freq: Every day | ORAL | 0 refills | Status: DC
  Filled 2022-11-11: qty 15, 15d supply, fill #0

## 2022-11-18 ENCOUNTER — Other Ambulatory Visit (HOSPITAL_COMMUNITY): Payer: Self-pay

## 2022-11-19 ENCOUNTER — Other Ambulatory Visit: Payer: Self-pay | Admitting: Family Medicine

## 2022-11-19 DIAGNOSIS — E039 Hypothyroidism, unspecified: Secondary | ICD-10-CM

## 2022-11-20 ENCOUNTER — Other Ambulatory Visit (HOSPITAL_COMMUNITY): Payer: Self-pay

## 2022-11-20 MED ORDER — LEVOTHYROXINE SODIUM 88 MCG PO TABS
88.0000 ug | ORAL_TABLET | Freq: Every day | ORAL | 0 refills | Status: DC
  Filled 2022-11-20: qty 30, 30d supply, fill #0

## 2022-11-25 ENCOUNTER — Other Ambulatory Visit: Payer: Self-pay

## 2022-11-25 ENCOUNTER — Encounter: Payer: Self-pay | Admitting: Family Medicine

## 2022-11-25 ENCOUNTER — Ambulatory Visit (INDEPENDENT_AMBULATORY_CARE_PROVIDER_SITE_OTHER): Payer: 59 | Admitting: Family Medicine

## 2022-11-25 ENCOUNTER — Other Ambulatory Visit (HOSPITAL_COMMUNITY): Payer: Self-pay

## 2022-11-25 VITALS — BP 148/90 | HR 56 | Temp 97.8°F | Resp 18 | Ht 72.0 in | Wt 189.6 lb

## 2022-11-25 DIAGNOSIS — Z23 Encounter for immunization: Secondary | ICD-10-CM | POA: Diagnosis not present

## 2022-11-25 DIAGNOSIS — E039 Hypothyroidism, unspecified: Secondary | ICD-10-CM

## 2022-11-25 DIAGNOSIS — I10 Essential (primary) hypertension: Secondary | ICD-10-CM

## 2022-11-25 DIAGNOSIS — I6522 Occlusion and stenosis of left carotid artery: Secondary | ICD-10-CM

## 2022-11-25 DIAGNOSIS — I6529 Occlusion and stenosis of unspecified carotid artery: Secondary | ICD-10-CM | POA: Diagnosis not present

## 2022-11-25 DIAGNOSIS — E538 Deficiency of other specified B group vitamins: Secondary | ICD-10-CM

## 2022-11-25 DIAGNOSIS — E782 Mixed hyperlipidemia: Secondary | ICD-10-CM | POA: Diagnosis not present

## 2022-11-25 DIAGNOSIS — N183 Chronic kidney disease, stage 3 unspecified: Secondary | ICD-10-CM

## 2022-11-25 DIAGNOSIS — Z0000 Encounter for general adult medical examination without abnormal findings: Secondary | ICD-10-CM | POA: Diagnosis not present

## 2022-11-25 DIAGNOSIS — H538 Other visual disturbances: Secondary | ICD-10-CM

## 2022-11-25 DIAGNOSIS — R739 Hyperglycemia, unspecified: Secondary | ICD-10-CM | POA: Diagnosis not present

## 2022-11-25 LAB — LIPID PANEL
Cholesterol: 100 mg/dL (ref 0–200)
HDL: 43.7 mg/dL (ref >39.00)
LDL Cholesterol: 44 mg/dL (ref 0–99)
NonHDL: 56.4
Total CHOL/HDL Ratio: 2
Triglycerides: 64 mg/dL (ref 0.0–149.0)
VLDL: 12.8 mg/dL (ref 0.0–40.0)

## 2022-11-25 LAB — COMPREHENSIVE METABOLIC PANEL
ALT: 30 U/L (ref 0–53)
AST: 26 U/L (ref 0–37)
Albumin: 4.4 g/dL (ref 3.5–5.2)
Alkaline Phosphatase: 65 U/L (ref 39–117)
BUN: 11 mg/dL (ref 6–23)
CO2: 31 mEq/L (ref 19–32)
Calcium: 9.4 mg/dL (ref 8.4–10.5)
Chloride: 103 mEq/L (ref 96–112)
Creatinine, Ser: 1.19 mg/dL (ref 0.40–1.50)
GFR: 68.45 mL/min (ref >60.00)
Glucose, Bld: 106 mg/dL — ABNORMAL HIGH (ref 70–99)
Potassium: 4.5 mEq/L (ref 3.5–5.1)
Sodium: 140 mEq/L (ref 135–145)
Total Bilirubin: 0.5 mg/dL (ref 0.2–1.2)
Total Protein: 6.6 g/dL (ref 6.0–8.3)

## 2022-11-25 LAB — CBC WITH DIFFERENTIAL/PLATELET
Basophils Absolute: 0 10*3/uL (ref 0.0–0.1)
Basophils Relative: 0.3 % (ref 0.0–3.0)
Eosinophils Absolute: 0.2 10*3/uL (ref 0.0–0.7)
Eosinophils Relative: 5.6 % — ABNORMAL HIGH (ref 0.0–5.0)
HCT: 47.2 % (ref 39.0–52.0)
Hemoglobin: 15.8 g/dL (ref 13.0–17.0)
Lymphocytes Relative: 17.8 % (ref 12.0–46.0)
Lymphs Abs: 0.7 10*3/uL (ref 0.7–4.0)
MCHC: 33.5 g/dL (ref 30.0–36.0)
MCV: 92 fl (ref 78.0–100.0)
Monocytes Absolute: 0.5 10*3/uL (ref 0.1–1.0)
Monocytes Relative: 12.8 % — ABNORMAL HIGH (ref 3.0–12.0)
Neutro Abs: 2.4 10*3/uL (ref 1.4–7.7)
Neutrophils Relative %: 63.5 % (ref 43.0–77.0)
Platelets: 228 10*3/uL (ref 150.0–400.0)
RBC: 5.13 Mil/uL (ref 4.22–5.81)
RDW: 13.4 % (ref 11.5–15.5)
WBC: 3.7 10*3/uL — ABNORMAL LOW (ref 4.0–10.5)

## 2022-11-25 LAB — HEMOGLOBIN A1C: Hgb A1c MFr Bld: 5.9 % (ref 4.6–6.5)

## 2022-11-25 MED ORDER — ROSUVASTATIN CALCIUM 10 MG PO TABS
10.0000 mg | ORAL_TABLET | Freq: Every day | ORAL | 3 refills | Status: DC
  Filled 2022-11-25 – 2023-01-05 (×2): qty 90, 90d supply, fill #0
  Filled 2023-04-14: qty 90, 90d supply, fill #1
  Filled 2023-07-13: qty 90, 90d supply, fill #2
  Filled 2023-10-15: qty 90, 90d supply, fill #3

## 2022-11-25 NOTE — Assessment & Plan Note (Signed)
Check tsh 

## 2022-11-25 NOTE — Assessment & Plan Note (Addendum)
Ghm utd Check labs  See AVS  Health Maintenance Due  Topic Date Due   Zoster Vaccines- Shingrix (1 of 2) Never done

## 2022-11-25 NOTE — Assessment & Plan Note (Signed)
Poorly controlled will alter medications, encouraged DASH diet, minimize caffeine and obtain adequate sleep. Report concerning symptoms and follow up as directed and as needed ---- pt states bp runs better at home  Return to office 2-3 weeks to check bp

## 2022-11-25 NOTE — Assessment & Plan Note (Signed)
Encourage heart healthy diet such as MIND or DASH diet, increase exercise, avoid trans fats, simple carbohydrates and processed foods, consider a krill or fish or flaxseed oil cap daily.  °

## 2022-11-25 NOTE — Progress Notes (Addendum)
Subjective:   By signing my name below, I, Jamey Reas, attest that this documentation has been prepared under the direction and in the presence of Ann Held, DO. 11/25/2022   Patient ID: Bobby Gonzalez, male    DOB: 04-Feb-1966, 57 y.o.   MRN: [id number]  Chief Complaint  Patient presents with   Annual Exam    Pt states fasting     HPI Patient is in today for a comprehensive physical exam.   Blood Pressure  His blood pressure is elevated during this visit. He typically has a systolic blood pressure of 115 when he checks it at home. He reports only  having a systolic blood pressure reading over 120 one day during this month. BP Readings from Last 3 Encounters:  11/25/22 (!) 148/90  10/02/22 126/86  09/06/22 118/77   Hernia -   He has a hernia in the RLQ of his abdomen. He has an upcoming appointment to monitor it  Rosuvastatin He is requesting a refill on 10 mg rosuvastatin during this visit.   Allergies  He takes zyrtec daily PO to mange his allergies and reports no new  flare ups while taking it.    Recent Surgeries  He had a left carotid stent placed on 09/05/2022.   Kidneys  He has an upcoming appointment with a nephrologist.   Immunizations He is interested in receiving the shingles immunization in the future. He is UTD on the first two Covid-19 vaccinations.  Colonoscopy Last colonoscopy completed on 04/27/2018.Two 5 mm polyps found in the rectum and mild diverticulosis found in the left colon, otherwise findings are normal. Repeat in 5 years.  Vision He is not UTD on vision.   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 radiation and chemo 05-13-12(Dr. Lamonte Sakai)   Cardiac murmur 09/29/2020   Carotid atherosclerosis 03/01/2021   Chronic kidney disease    right renal mass 7.3x6.7x6.8cm renal  cell ca   Dehydration 05/13/2012   Difficult intubation    POSSIBLE DIFFICULT INTUBATION--S/P RADIATION FOR CANCER LEFT TONSIL--EATING BUT THROAT STILL SORE AND PT HAS THICK MUCUS   Diverticula of colon 03/10/2012   multiple rectosigmoid colonic diverticula/ ct abd/pelvis   Dizziness 11/13/2020   Dry mouth 05/16/2014   Essential hypertension 10/11/2019   Gastro-esophageal reflux disease without esophagitis    GERD (gastroesophageal reflux disease)    Hearing loss of right ear 05/29/2020   History of cancer tonsil 09/20/2016   History of head and neck radiation 09/20/2016   History of radiation therapy 03/30/12-05/15/12   left tonsil   HTN (hypertension) 06/25/2012   at first start of chemo for squamous cell cancer of neck-left   Hyperlipidemia 05/21/2021   Hypothyroid 02/15/2013   Inguinal hernia 10/23/2012   Inguinal hernia    right   Malignant tumor of kidney ([place]) 09/27/2020   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[place]) 09/27/2020   Multiple lung nodules on CT 05/16/2014   Pain    JOINT PAINS UPPER BODY AFTER RADIATION TREATMENTS   Personal history of malignant neoplasm of unspecified site of lip, oral cavity, and pharynx 09/27/2020   Personal history of other malignant neoplasm of kidney 09/27/2020   Phlegm in throat 05/13/2012   Pneumonia    Preventative health care 05/16/2014   Preventive measure 05/16/2014   Renal  cell carcinoma ([place]) 09/09/2012   Renal mass 03/30/2012   Sensorineural hearing loss, bilateral 09/20/2016   Solitary kidney, acquired 09/29/2020   Solitary pulmonary nodule 09/27/2020   Syncope 09/29/2020   Tinnitus aurium, right 05/29/2020   Tonsil cancer ([place]) 02/2012   SCCa of Left tonsil-S/P biopsy on 02/17/12.    Past Surgical History:  Procedure Laterality Date   DIRECT LARYNGOSCOPY     S/P DL, Biopsy-Dr. Constance Holster. Pathology postive for SCCa of Left Tonsil   GASTROSTOMY TUBE PLACEMENT  03/02/12   INGUINAL HERNIA REPAIR Left 11/04/2012   Procedure: HERNIA REPAIR  INGUINAL ADULT;  Surgeon: Joyice Faster. Cornett, MD;  Location: [facility];  Service: General;  Laterality: Left;   INSERTION OF MESH Left 11/04/2012   Procedure: INSERTION OF MESH;  Surgeon: Joyice Faster. Cornett, MD;  Location: [facility];  Service: General;  Laterality: Left;   LAPAROSCOPIC NEPHRECTOMY  06/29/2012   Procedure: LAPAROSCOPIC NEPHRECTOMY;  Surgeon: Dutch Gray, MD;  Location: [facility];  Service: Urology;  Laterality: Right;   MULTIPLE TOOTH EXTRACTIONS  03/04/12   with DR. Mohorn   PEG TUBE PLACEMENT  06-25-12   remains in place abdomen-not using at present.   PEG TUBE REMOVAL  10/2012   tonsil biopsy  02/17/12   SCCa left tomnsil,HPV positive,spread to l cervical node   TRANSCAROTID ARTERY REVASCULARIZATION  Left 09/05/2022   Procedure: Left Transcarotid Artery Revascularization;  Surgeon: Waynetta Sandy, MD;  Location: [place];  Service: Vascular;  Laterality: Left;   ULTRASOUND GUIDANCE FOR VASCULAR ACCESS Right 09/05/2022   Procedure: ULTRASOUND GUIDANCE FOR VASCULAR ACCESS RIGHT FEMORAL ARTERY;  Surgeon: Waynetta Sandy, MD;  Location[facility];  Service: Vascular;  Laterality: Right;   WISDOM TOOTH EXTRACTION      Family History  Problem Relation Age of Onset   Stroke Father    Cancer Father        Prostate   Hypertension Father    Thyroid disease Brother    Hypertension Brother    Throat cancer Brother    Colon cancer Neg Hx    Esophageal cancer Neg Hx    Rectal cancer Neg Hx    Stomach cancer Neg Hx     Social History   Socioeconomic History   Marital status: Married    Spouse name: Not on file   Number of children: 2   Years of education: Not on file   Highest education level: Associate degree: academic program  Occupational History    Employer: [employer], maintenance    Comment: Immunologist  Tobacco Use   Smoking status: Never   Smokeless tobacco: Never  Vaping Use   Vaping Use: Never used  Substance and Sexual Activity   Alcohol use: No    Comment:  Never drank alcohol   Drug use: No   Sexual activity: Yes  Other Topics Concern   Not on file  Social History Narrative   The patient is married and has 2 daughters.Patient has never been a smoker.Patient denies use of smokeless tobacco.Patient has never drank alcohol.   Right handed   Caffeine: no coffee or soda. Tea in the weekend, drinks mainly flavored water due to only having 1 kidney   Social Determinants of Health   Financial Resource Strain: Not on file  Food Insecurity: Not on file  Transportation Needs: Not on file  Physical Activity: Not on file  Stress: Not on file  Social Connections: Not on file  Intimate Partner Violence: Not on  file    Outpatient Medications Prior to Visit  Medication Sig Dispense Refill   aspirin EC 81 MG tablet Take 81 mg by mouth daily.     cetirizine (ZYRTEC) 10 MG tablet Take 10 mg by mouth daily.     clopidogrel (PLAVIX) 75 MG tablet Take 1 tablet (75 mg total) by mouth daily. 30 tablet 6   levothyroxine (SYNTHROID) 88 MCG tablet Take 1 tablet (88 mcg total) by mouth daily before breakfast. 30 tablet 0   Multiple Vitamin (MULTIVITAMIN) tablet Take 1 tablet by mouth daily.     pilocarpine (SALAGEN) 5 MG tablet Take 1 tablet by mouth 3 (three) times daily. 90 tablet 3   valsartan (DIOVAN) 40 MG tablet Take 1 tablet (40 mg total) by mouth daily. Patient needs an appointment for further refills. 3 rd/final attempt 15 tablet 0   rosuvastatin (CRESTOR) 10 MG tablet Take 1 tablet (10 mg total) by mouth daily. 90 tablet 2   No facility-administered medications prior to visit.    Allergies  Allergen Reactions   Banana Shortness Of Breath, Rash and Other (See Comments)    Sneezing    Penicillins Other (See Comments)    Unknown per Pt     Review of Systems  Constitutional:  Negative for fever and malaise/fatigue.  HENT:  Negative for congestion.   Eyes:  Negative for blurred vision.  Respiratory:  Negative for shortness of breath.    Cardiovascular:  Negative for chest pain, palpitations and leg swelling.  Gastrointestinal:  Negative for abdominal pain, blood in stool and nausea.  Genitourinary:  Negative for dysuria and frequency.  Musculoskeletal:  Negative for falls.  Skin:  Negative for rash.  Neurological:  Negative for dizziness, loss of consciousness and headaches.  Endo/Heme/Allergies:  Negative for environmental allergies.  Psychiatric/Behavioral:  Negative for depression. The patient is not nervous/anxious.        Objective:    Physical Exam Vitals and nursing note reviewed.  Constitutional:      General: He is not in acute distress.    Appearance: Normal appearance. He is well-developed.  HENT:     Head: Normocephalic and atraumatic.     Right Ear: Tympanic membrane, ear canal and external ear normal.     Left Ear: Tympanic membrane, ear canal and external ear normal.  Eyes:     Extraocular Movements: Extraocular movements intact.     Right eye: No nystagmus.     Left eye: No nystagmus.     Pupils: Pupils are equal, round, and reactive to light.  Neck:     Thyroid: No thyromegaly.  Cardiovascular:     Rate and Rhythm: Normal rate and regular rhythm.     Heart sounds: Normal heart sounds. No murmur heard.    No gallop.     Comments: Manual bp recheck 148/90 Recheck with patient's bp machine 150/99 Pulmonary:     Effort: Pulmonary effort is normal. No respiratory distress.     Breath sounds: Normal breath sounds. No wheezing or rales.  Chest:     Chest wall: No tenderness.  Abdominal:     General: Bowel sounds are normal. There is no distension.     Palpations: Abdomen is soft.     Tenderness: There is no abdominal tenderness. There is no guarding or rebound.     Hernia: A hernia (RLQ) is present.     Comments: Reducible hernia,  R inguinal Pt wears a belt to help during the day-- he saw surgeon  If worsens he will f/u surgery   Musculoskeletal:        General: Normal range of motion.      Cervical back: Normal range of motion and neck supple.     Right hip: Tenderness present. Normal range of motion. Normal strength.     Left hip: Tenderness present. Normal range of motion. Normal strength.     Right foot: Bony tenderness present. No swelling.     Left foot: Bony tenderness present. No swelling.     Comments: (+) 5/5 strength in upper and lower extremities   Lymphadenopathy:     Cervical: No cervical adenopathy.  Skin:    General: Skin is warm and dry.  Neurological:     General: No focal deficit present.     Mental Status: He is alert and oriented to person, place, and time.     Deep Tendon Reflexes:     Reflex Scores:      Patellar reflexes are 2+ on the right side and 2+ on the left side. Psychiatric:        Mood and Affect: Mood normal.        Behavior: Behavior normal.        Thought Content: Thought content normal.        Judgment: Judgment normal.     BP (!) 148/90   Pulse (!) 56   Temp 97.8 F (36.6 C) (Oral)   Resp 18   Ht 6' (1.829 m)   Wt 189 lb 9.6 oz (86 kg)   SpO2 98%   BMI 25.71 kg/m  Wt Readings from Last 3 Encounters:  11/25/22 189 lb 9.6 oz (86 kg)  09/05/22 184 lb 4.9 oz (83.6 kg)  08/22/22 185 lb 11.2 oz (84.2 kg)       Assessment & Plan:  Preventative health care Assessment & Plan: Ghm utd Check labs  See AVS  Health Maintenance Due  Topic Date Due   Zoster Vaccines- Shingrix (1 of 2) Never done     Orders: -     Rosuvastatin Calcium; Take 1 tablet (10 mg total) by mouth daily.  Dispense: 90 tablet; Refill: 3 -     Lipid panel -     PSA -     TSH -     Comprehensive metabolic panel -     CBC with Differential/Platelet -     Vitamin B12 -     Hemoglobin A1c  Hyperglycemia -     Hemoglobin A1c  Mixed hyperlipidemia Assessment & Plan: Encourage heart healthy diet such as MIND or DASH diet, increase exercise, avoid trans fats, simple carbohydrates and processed foods, consider a krill or fish or flaxseed oil  cap daily.    Orders: -     Rosuvastatin Calcium; Take 1 tablet (10 mg total) by mouth daily.  Dispense: 90 tablet; Refill: 3 -     Lipid panel -     Comprehensive metabolic panel  123456 deficiency -     CBC with Differential/Platelet -     Vitamin B12  Hypothyroidism, unspecified type Assessment & Plan: Check tsh  Orders: -     TSH -     CBC with Differential/Platelet  Primary hypertension Assessment & Plan: Poorly controlled will alter medications, encouraged DASH diet, minimize caffeine and obtain adequate sleep. Report concerning symptoms and follow up as directed and as needed ---- pt states bp runs better at home  Return to office 2-3 weeks to check bp  Stage 3 chronic kidney disease, unspecified whether stage 3a or 3b CKD ([place]) -     Comprehensive metabolic panel  Carotid atherosclerosis, unspecified laterality -     Lipid panel -     Comprehensive metabolic panel  Stenosis of left carotid artery -     Lipid panel -     Comprehensive metabolic panel  Need for shingles vaccine -     Varicella-zoster vaccine IM; Future  Blurry vision -     Ambulatory referral to Ophthalmology    I, Ann Held, DO, personally preformed the services described in this documentation.  All medical record entries made by the scribe were at my direction and in my presence.  I have reviewed the chart and discharge instructions (if applicable) and agree that the record reflects my personal performance and is accurate and complete. 11/25/2022  Ann Held, DO   I,Kennedy C Lynn,acting as a scribe for Ann Held, DO.,have documented all relevant documentation on the behalf of Ann Held, DO,as directed by  Ann Held, DO while in the presence of Ann Held, DO.

## 2022-11-25 NOTE — Patient Instructions (Signed)

## 2022-11-26 ENCOUNTER — Telehealth: Payer: Self-pay

## 2022-11-26 DIAGNOSIS — J189 Pneumonia, unspecified organism: Secondary | ICD-10-CM

## 2022-11-26 HISTORY — DX: Pneumonia, unspecified organism: J18.9

## 2022-11-26 LAB — TSH: TSH: 3.87 u[IU]/mL (ref 0.35–5.50)

## 2022-11-26 LAB — VITAMIN B12: Vitamin B-12: 450 pg/mL (ref 211–911)

## 2022-11-26 LAB — PSA: PSA: 0.21 ng/mL (ref 0.10–4.00)

## 2022-11-26 NOTE — Telephone Encounter (Signed)
Pt has appt 12/05/22 with Dr. Geraldo Pitter. I will update all parties involved.

## 2022-11-26 NOTE — Telephone Encounter (Signed)
Primary Cardiologist:Rajan R Revankar, MD  Chart reviewed as part of pre-operative protocol coverage. Because of Bobby Gonzalez's past medical history and time since last visit, he/she will require a follow-up visit in order to better assess preoperative cardiovascular risk.  Pre-op covering staff: -Patient has an appointment with Dr. Geraldo Pitter on 4/4 at which time clearance will be addressed.  Appointment notes have been updated. - Please contact requesting surgeon's office via preferred method (i.e, phone, fax) to inform them of need for appointment prior to surgery.   Emmaline Life, NP-C  11/26/2022, 4:31 PM [address] Office [phone] Fax [phone]

## 2022-11-26 NOTE — Telephone Encounter (Signed)
   Pre-operative Risk Assessment    Patient Name: Bobby Gonzalez  DOB: 15-Mar-1966 MRN: [id number]      Request for Surgical Clearance    Procedure:   inguinal hernia surgery  Date of Surgery:  Clearance TBD                                 Surgeon:  Dr. Gurney Maxin Surgeon's Group or Practice Name:  [facility] Phone number:  [phone] Fax number:  [phone] Attention Emeline Gins, CMA   Type of Clearance Requested:   - Pharmacy:  Hold Clopidogrel (Plavix) please advise   Type of Anesthesia:  General    Additional requests/questions:    Gretchen Short   11/26/2022, 3:01 PM

## 2022-11-27 ENCOUNTER — Encounter: Payer: Self-pay | Admitting: Family Medicine

## 2022-11-28 ENCOUNTER — Ambulatory Visit: Payer: 59

## 2022-11-28 ENCOUNTER — Other Ambulatory Visit (HOSPITAL_COMMUNITY): Payer: Self-pay

## 2022-11-28 MED ORDER — VALSARTAN 80 MG PO TABS
80.0000 mg | ORAL_TABLET | Freq: Every day | ORAL | 1 refills | Status: DC
  Filled 2022-11-28: qty 90, 90d supply, fill #0
  Filled 2023-02-16: qty 90, 90d supply, fill #1

## 2022-12-05 ENCOUNTER — Encounter: Payer: Self-pay | Admitting: Cardiology

## 2022-12-05 ENCOUNTER — Ambulatory Visit: Payer: 59 | Attending: Cardiology | Admitting: Cardiology

## 2022-12-05 VITALS — BP 120/82 | HR 67 | Ht 71.0 in | Wt 189.1 lb

## 2022-12-05 DIAGNOSIS — I10 Essential (primary) hypertension: Secondary | ICD-10-CM

## 2022-12-05 DIAGNOSIS — I6523 Occlusion and stenosis of bilateral carotid arteries: Secondary | ICD-10-CM

## 2022-12-05 DIAGNOSIS — E782 Mixed hyperlipidemia: Secondary | ICD-10-CM

## 2022-12-05 DIAGNOSIS — Z905 Acquired absence of kidney: Secondary | ICD-10-CM

## 2022-12-05 NOTE — Patient Instructions (Signed)
Medication Instructions:  Your physician recommends that you continue on your current medications as directed. Please refer to the Current Medication list given to you today.  *If you need a refill on your cardiac medications before your next appointment, please call your pharmacy*   Lab Work: Your physician recommends that you return for lab work in: the next few days for BMP. [facility] lab is located on the 3rd floor, [address]. Hours are Monday - Friday 8 am to 4 pm, closed 11:30 am to 1:00 pm. You do NOT need an appointment.   If you have labs (blood work) drawn today and your tests are completely normal, you will receive your results only by: [place] (if you have MyChart) OR A paper copy in the mail If you have any lab test that is abnormal or we need to change your treatment, we will call you to review the results.   Testing/Procedures: None ordered   Follow-Up: At [facility], you and your health needs are our priority.  As part of our continuing mission to provide you with exceptional heart care, we have created designated Provider Care Teams.  These Care Teams include your primary Cardiologist (physician) and Advanced Practice Providers (APPs -  Physician Assistants and Nurse Practitioners) who all work together to provide you with the care you need, when you need it.  We recommend signing up for the patient portal called "MyChart".  Sign up information is provided on this After Visit Summary.  MyChart is used to connect with patients for Virtual Visits (Telemedicine).  Patients are able to view lab/test results, encounter notes, upcoming appointments, etc.  Non-urgent messages can be sent to your provider as well.   To learn more about what you can do with MyChart, go to [email or web address].    Your next appointment:   9 month(s)  The format for your next appointment:   In Person  Provider:   Jyl Heinz, MD    Other  Instructions none  Important Information About Sugar

## 2022-12-05 NOTE — Progress Notes (Signed)
Cardiology Office Note:    Date:  12/05/2022   ID:  Bobby Gonzalez, DOB 04/03/1966, MRN [id number]  PCP:  Carollee Herter, Alferd Apa, DO  Cardiologist:  Jenean Lindau, MD   Referring MD: Carollee Herter, Alferd Apa, *    ASSESSMENT:    1. Atherosclerosis of both carotid arteries   2. Essential hypertension   3. Solitary kidney, acquired   4. Mixed hyperlipidemia    PLAN:    In order of problems listed above:  Atherosclerotic vascular disease and carotid cerebrovascular disease: Secondary prevention stressed with the patient.  Importance of compliance with diet medication stressed any vocalized understanding.  He has excellent effort tolerance.  He exercises regularly. Essential hypertension: Blood pressure stable and diet was emphasized.  His ARB was increased by primary care.  In view of solitary kidney I will do a Chem-7 in a week.  His blood pressures are stable.  Lifestyle modification and salt intake issues were discussed. Mixed dyslipidemia: On lipid-lowering medications.  Lipids reviewed and discussed with him and they are fine. Patient will be seen in follow-up appointment in 6 months or earlier if the patient has any concerns.    Medication Adjustments/Labs and Tests Ordered: Current medicines are reviewed at length with the patient today.  Concerns regarding medicines are outlined above.  No orders of the defined types were placed in this encounter.  No orders of the defined types were placed in this encounter.    No chief complaint on file.    History of Present Illness:    Bobby Gonzalez is a 57 y.o. male.  Patient has past medical history of carotid atherosclerosis post intervention, essential hypertension, dyslipidemia.  He denies any problems at this time and takes care of activities of daily living.  No chest pain orthopnea or PND.  He has a solitary kidney.  At the time of my evaluation, the patient is alert awake oriented and in no distress.  He exercises on a  regular basis.  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 radiation and chemo 05-13-12(Dr. Lamonte Sakai)   Cancer of other and ill-defined sites within the digestive organs and peritoneum 01/18/2022   Cardiac murmur 09/29/2020   Carotid atherosclerosis 03/01/2021   Carotid stenosis 09/05/2022   Chronic kidney disease    right renal mass 7.3x6.7x6.8cm renal cell ca   Dehydration 05/13/2012   Difficult intubation    POSSIBLE DIFFICULT INTUBATION--S/P RADIATION FOR CANCER LEFT TONSIL--EATING BUT THROAT STILL SORE AND PT HAS THICK MUCUS   Diverticula of colon 03/10/2012   multiple rectosigmoid colonic diverticula/ ct abd/pelvis   Dizziness 11/13/2020   Dry mouth 05/16/2014   Essential hypertension 10/11/2019   Gastro-esophageal reflux disease without esophagitis    GERD (gastroesophageal reflux disease)    Hearing loss of right ear 05/29/2020   History of cancer tonsil 09/20/2016   History of head and neck radiation 09/20/2016   History of radiation therapy 03/30/12-05/15/12   left tonsil   HTN (hypertension) 06/25/2012   at first start of chemo for squamous cell cancer of neck-left   Hyperlipidemia 05/21/2021   Hypothyroid 02/15/2013   Inguinal hernia 10/23/2012   Inguinal hernia    right   Malignant tumor of kidney 09/27/2020   Malignant tumor of tonsil 09/27/2020  Multiple lung nodules on CT 05/16/2014   Other fatigue 03/04/2022   Pain    JOINT PAINS UPPER BODY AFTER RADIATION TREATMENTS   Personal history of malignant neoplasm of unspecified site of lip, oral cavity, and pharynx 09/27/2020   Personal history of other malignant neoplasm of kidney 09/27/2020   Phlegm in throat 05/13/2012   Pneumonia    Preventative health care 05/16/2014   Preventive measure 05/16/2014    Renal cell carcinoma 09/09/2012   Renal mass 03/30/2012   Sensorineural hearing loss, bilateral 09/20/2016   Solitary kidney, acquired 09/29/2020   Solitary pulmonary nodule 09/27/2020   Syncope 09/29/2020   Tinnitus aurium, right 05/29/2020   Tonsil cancer 02/2012   SCCa of Left tonsil-S/P biopsy on 02/17/12.   Viral upper respiratory tract infection 03/04/2022    Past Surgical History:  Procedure Laterality Date   DIRECT LARYNGOSCOPY     S/P DL, Biopsy-Dr. Constance Holster. Pathology postive for SCCa of Left Tonsil   GASTROSTOMY TUBE PLACEMENT  03/02/12   INGUINAL HERNIA REPAIR Left 11/04/2012   Procedure: HERNIA REPAIR INGUINAL ADULT;  Surgeon: Joyice Faster. Cornett, MD;  Location: [facility];  Service: General;  Laterality: Left;   INSERTION OF MESH Left 11/04/2012   Procedure: INSERTION OF MESH;  Surgeon: Joyice Faster. Cornett, MD;  Location: [facility];  Service: General;  Laterality: Left;   LAPAROSCOPIC NEPHRECTOMY  06/29/2012   Procedure: LAPAROSCOPIC NEPHRECTOMY;  Surgeon: Dutch Gray, MD;  Location: [facility];  Service: Urology;  Laterality: Right;   MULTIPLE TOOTH EXTRACTIONS  03/04/12   with DR. Mohorn   PEG TUBE PLACEMENT  06-25-12   remains in place abdomen-not using at present.   PEG TUBE REMOVAL  10/2012   tonsil biopsy  02/17/12   SCCa left tomnsil,HPV positive,spread to l cervical node   TRANSCAROTID ARTERY REVASCULARIZATION  Left 09/05/2022   Procedure: Left Transcarotid Artery Revascularization;  Surgeon: Waynetta Sandy, MD;  Location: [place];  Service: Vascular;  Laterality: Left;   ULTRASOUND GUIDANCE FOR VASCULAR ACCESS Right 09/05/2022   Procedure: ULTRASOUND GUIDANCE FOR VASCULAR ACCESS RIGHT FEMORAL ARTERY;  Surgeon: Waynetta Sandy, MD;  Location: [place] [place];  Service: Vascular;  Laterality: Right;   WISDOM TOOTH EXTRACTION      Current Medications: Current Meds  Medication Sig   aspirin EC 81 MG tablet Take 81 mg by mouth daily.   cetirizine (ZYRTEC) 10 MG tablet Take 10 mg by  mouth daily.   clopidogrel (PLAVIX) 75 MG tablet Take 1 tablet (75 mg total) by mouth daily.   levothyroxine (SYNTHROID) 88 MCG tablet Take 1 tablet (88 mcg total) by mouth daily before breakfast.   Multiple Vitamin (MULTIVITAMIN) tablet Take 1 tablet by mouth daily.   pilocarpine (SALAGEN) 5 MG tablet Take 1 tablet by mouth 3 (three) times daily.   rosuvastatin (CRESTOR) 10 MG tablet Take 1 tablet (10 mg total) by mouth daily.   valsartan (DIOVAN) 80 MG tablet Take 1 tablet (80 mg total) by mouth daily.     Allergies:   Banana and Penicillins   Social History   Socioeconomic History   Marital status: Married    Spouse name: Not on file   Number of children: 2   Years of education: Not on file   Highest education level: Associate degree: academic program  Occupational History    Employer: [employer]    Comment: Immunologist  Tobacco Use   Smoking status: Never   Smokeless tobacco: Never  Vaping Use  Vaping Use: Never used  Substance and Sexual Activity   Alcohol use: No    Comment: Never drank alcohol   Drug use: No   Sexual activity: Yes  Other Topics Concern   Not on file  Social History Narrative   The patient is married and has 2 daughters.Patient has never been a smoker.Patient denies use of smokeless tobacco.Patient has never drank alcohol.   Right handed   Caffeine: no coffee or soda. Tea in the weekend, drinks mainly flavored water due to only having 1 kidney   Social Determinants of Health   Financial Resource Strain: Not on file  Food Insecurity: Not on file  Transportation Needs: Not on file  Physical Activity: Not on file  Stress: Not on file  Social Connections: Not on file     Family History: The patient's family history includes Cancer in his father; Hypertension in his brother and father; Stroke in his father; Throat cancer in his brother; Thyroid disease in his brother. There is no history of Colon cancer, Esophageal cancer, Rectal cancer,  or Stomach cancer.  ROS:   Please see the history of present illness.    All other systems reviewed and are negative.  EKGs/Labs/Other Studies Reviewed:    The following studies were reviewed today: I discussed my findings with the patient at length.  EKG reveals sinus rhythm and nonspecific ST-T changes.   Recent Labs: 11/25/2022: ALT 30; BUN 11; Creatinine, Ser 1.19; Hemoglobin 15.8; Platelets 228.0; Potassium 4.5; Sodium 140; TSH 3.87  Recent Lipid Panel    Component Value Date/Time   CHOL 100 11/25/2022 0859   CHOL 105 10/04/2021 0844   TRIG 64.0 11/25/2022 0859   HDL 43.70 11/25/2022 0859   HDL 48 10/04/2021 0844   CHOLHDL 2 11/25/2022 0859   VLDL 12.8 11/25/2022 0859   LDLCALC 44 11/25/2022 0859   LDLCALC 45 10/04/2021 0844   LDLCALC 61 05/15/2020 0929    Physical Exam:    VS:  BP 120/82   Pulse 67   Ht 5\' 11"  (1.803 m)   Wt 189 lb 1.9 oz (85.8 kg)   SpO2 97%   BMI 26.38 kg/m     Wt Readings from Last 3 Encounters:  12/05/22 189 lb 1.9 oz (85.8 kg)  11/25/22 189 lb 9.6 oz (86 kg)  09/05/22 184 lb 4.9 oz (83.6 kg)     GEN: Patient is in no acute distress HEENT: Normal NECK: No JVD; No carotid bruits LYMPHATICS: No lymphadenopathy CARDIAC: Hear sounds regular, 2/6 systolic murmur at the apex. RESPIRATORY:  Clear to auscultation without rales, wheezing or rhonchi  ABDOMEN: Soft, non-tender, non-distended MUSCULOSKELETAL:  No edema; No deformity  SKIN: Warm and dry NEUROLOGIC:  Alert and oriented x 3 PSYCHIATRIC:  Normal affect   Signed, Jenean Lindau, MD  12/05/2022 9:59 AM    [facility]

## 2022-12-11 NOTE — Telephone Encounter (Signed)
I have informed surgery scheduler Adelina Mings that we are awaiting input from Dr. Tomie China. I will fax notes as FYI awaiting input from MD. Once pt has been cleared we will fax over final clearance notes.

## 2022-12-11 NOTE — Telephone Encounter (Signed)
   Name: Bobby Gonzalez  DOB: April 20, 1966  MRN: [id number]   Primary Cardiologist: Garwin Brothers, MD  Chart reviewed as part of pre-operative protocol coverage. Bobby Gonzalez was last seen on 12/05/2022 by Dr. Tomie China.  Per correspondence with Dr. Tomie China: "I remember speaking to this patient.  He does landscape work.  He is very active and has excellent effort tolerance.  I called him again to check and confirm this and my impression is the same.  Based on this he is not at high risk for coronary events during the aforementioned surgery.  Meticulous hemodynamic monitoring will further reduce the risk of coronary events."  Therefore, based on ACC/AHA guidelines, the patient would be at acceptable risk for the planned procedure without further cardiovascular testing.   Per office protocol, he may hold Plavix for 5 days prior to procedure and should resume as soon as hemodynamically stable postoperatively.   I will route this recommendation to the requesting party via Epic fax function and remove from pre-op pool. Please call with questions.  Carlos Levering, NP 12/11/2022, 5:01 PM

## 2022-12-11 NOTE — Telephone Encounter (Signed)
Dr. Tomie China,   You saw this patient on 12/05/2022. Will you please comment on clearance for inguinal hernia surgery?  Please route your response to [facility] Preop. I will communicate with requesting office once you have given recommendations.   Thank you!  Carlos Levering, NP

## 2022-12-11 NOTE — Telephone Encounter (Signed)
Caller is following-up on status of patient's clearance. 

## 2022-12-11 NOTE — Telephone Encounter (Signed)
I will forward to pre op APP and Dr. Tomie China to see if pt has been cleared. Pre op was noted on appt notes. I do not see that clearance and recommendations were addressed in clearance.

## 2022-12-17 DIAGNOSIS — C641 Malignant neoplasm of right kidney, except renal pelvis: Secondary | ICD-10-CM | POA: Diagnosis not present

## 2022-12-17 DIAGNOSIS — N2581 Secondary hyperparathyroidism of renal origin: Secondary | ICD-10-CM | POA: Diagnosis not present

## 2022-12-17 DIAGNOSIS — I129 Hypertensive chronic kidney disease with stage 1 through stage 4 chronic kidney disease, or unspecified chronic kidney disease: Secondary | ICD-10-CM | POA: Diagnosis not present

## 2022-12-17 DIAGNOSIS — N183 Chronic kidney disease, stage 3 unspecified: Secondary | ICD-10-CM | POA: Diagnosis not present

## 2022-12-19 LAB — LAB REPORT - SCANNED
Creatinine, Random Urine: 29.8
Total Protein, Urine: <4

## 2022-12-22 ENCOUNTER — Other Ambulatory Visit: Payer: Self-pay | Admitting: Family Medicine

## 2022-12-22 DIAGNOSIS — E039 Hypothyroidism, unspecified: Secondary | ICD-10-CM

## 2022-12-23 ENCOUNTER — Other Ambulatory Visit (HOSPITAL_COMMUNITY): Payer: Self-pay

## 2022-12-23 MED ORDER — LEVOTHYROXINE SODIUM 88 MCG PO TABS
88.0000 ug | ORAL_TABLET | Freq: Every day | ORAL | 1 refills | Status: DC
  Filled 2022-12-23: qty 90, 90d supply, fill #0
  Filled 2023-03-24: qty 90, 90d supply, fill #1

## 2022-12-28 ENCOUNTER — Other Ambulatory Visit: Payer: Self-pay | Admitting: Family Medicine

## 2022-12-30 ENCOUNTER — Other Ambulatory Visit (HOSPITAL_COMMUNITY): Payer: Self-pay

## 2022-12-30 MED ORDER — PILOCARPINE HCL 5 MG PO TABS
5.0000 mg | ORAL_TABLET | Freq: Three times a day (TID) | ORAL | 3 refills | Status: DC
  Filled 2022-12-30: qty 90, 30d supply, fill #0
  Filled 2023-02-02: qty 90, 30d supply, fill #1
  Filled 2023-03-14: qty 90, 30d supply, fill #2
  Filled 2023-04-14: qty 90, 30d supply, fill #3

## 2023-01-06 ENCOUNTER — Other Ambulatory Visit (HOSPITAL_COMMUNITY): Payer: Self-pay

## 2023-01-06 ENCOUNTER — Other Ambulatory Visit: Payer: Self-pay

## 2023-01-29 ENCOUNTER — Ambulatory Visit: Payer: Self-pay | Admitting: General Surgery

## 2023-02-03 ENCOUNTER — Encounter: Payer: Self-pay | Admitting: Gastroenterology

## 2023-02-16 ENCOUNTER — Other Ambulatory Visit: Payer: Self-pay | Admitting: Vascular Surgery

## 2023-02-17 ENCOUNTER — Other Ambulatory Visit: Payer: Self-pay

## 2023-02-18 ENCOUNTER — Other Ambulatory Visit (HOSPITAL_COMMUNITY): Payer: Self-pay

## 2023-02-19 ENCOUNTER — Other Ambulatory Visit (HOSPITAL_COMMUNITY): Payer: Self-pay

## 2023-02-19 MED ORDER — CLOPIDOGREL BISULFATE 75 MG PO TABS
75.0000 mg | ORAL_TABLET | Freq: Every day | ORAL | 6 refills | Status: DC
  Filled 2023-02-19: qty 30, 30d supply, fill #0
  Filled 2023-03-14 – 2023-03-15 (×2): qty 30, 30d supply, fill #1
  Filled 2023-04-14: qty 30, 30d supply, fill #2
  Filled 2023-05-12: qty 30, 30d supply, fill #3
  Filled 2023-06-16: qty 30, 30d supply, fill #4
  Filled 2023-07-11: qty 30, 30d supply, fill #5
  Filled 2023-08-12: qty 30, 30d supply, fill #6

## 2023-02-27 ENCOUNTER — Ambulatory Visit: Admit: 2023-02-27 | Payer: 59 | Admitting: General Surgery

## 2023-02-27 SURGERY — REPAIR, HERNIA, INGUINAL, ADULT
Anesthesia: General | Laterality: Right

## 2023-03-14 ENCOUNTER — Other Ambulatory Visit: Payer: Self-pay

## 2023-03-14 ENCOUNTER — Other Ambulatory Visit (HOSPITAL_COMMUNITY): Payer: Self-pay

## 2023-05-12 ENCOUNTER — Other Ambulatory Visit (HOSPITAL_COMMUNITY): Payer: Self-pay

## 2023-05-12 ENCOUNTER — Other Ambulatory Visit: Payer: Self-pay | Admitting: Family Medicine

## 2023-05-12 ENCOUNTER — Other Ambulatory Visit: Payer: Self-pay

## 2023-05-12 MED ORDER — PILOCARPINE HCL 5 MG PO TABS
5.0000 mg | ORAL_TABLET | Freq: Three times a day (TID) | ORAL | 3 refills | Status: DC
  Filled 2023-05-12: qty 90, 30d supply, fill #0
  Filled 2023-07-07: qty 90, 30d supply, fill #1
  Filled 2023-08-12: qty 90, 30d supply, fill #2
  Filled 2023-09-22: qty 90, 30d supply, fill #3

## 2023-05-19 ENCOUNTER — Other Ambulatory Visit (HOSPITAL_COMMUNITY): Payer: Self-pay

## 2023-05-30 ENCOUNTER — Other Ambulatory Visit (HOSPITAL_COMMUNITY): Payer: Self-pay

## 2023-05-30 ENCOUNTER — Other Ambulatory Visit: Payer: Self-pay | Admitting: Family Medicine

## 2023-05-30 MED ORDER — VALSARTAN 80 MG PO TABS
80.0000 mg | ORAL_TABLET | Freq: Every day | ORAL | 0 refills | Status: DC
  Filled 2023-05-30: qty 90, 90d supply, fill #0

## 2023-06-12 ENCOUNTER — Other Ambulatory Visit (HOSPITAL_COMMUNITY): Payer: Self-pay

## 2023-06-12 ENCOUNTER — Encounter: Payer: Self-pay | Admitting: Family Medicine

## 2023-06-12 ENCOUNTER — Ambulatory Visit: Payer: 59 | Admitting: Family Medicine

## 2023-06-12 VITALS — BP 130/80 | HR 53 | Temp 98.2°F | Resp 18 | Ht 71.0 in | Wt 195.4 lb

## 2023-06-12 DIAGNOSIS — Z23 Encounter for immunization: Secondary | ICD-10-CM | POA: Diagnosis not present

## 2023-06-12 DIAGNOSIS — I10 Essential (primary) hypertension: Secondary | ICD-10-CM | POA: Diagnosis not present

## 2023-06-12 DIAGNOSIS — J069 Acute upper respiratory infection, unspecified: Secondary | ICD-10-CM

## 2023-06-12 DIAGNOSIS — E538 Deficiency of other specified B group vitamins: Secondary | ICD-10-CM | POA: Diagnosis not present

## 2023-06-12 DIAGNOSIS — E782 Mixed hyperlipidemia: Secondary | ICD-10-CM | POA: Diagnosis not present

## 2023-06-12 DIAGNOSIS — E039 Hypothyroidism, unspecified: Secondary | ICD-10-CM | POA: Diagnosis not present

## 2023-06-12 DIAGNOSIS — R051 Acute cough: Secondary | ICD-10-CM | POA: Insufficient documentation

## 2023-06-12 HISTORY — DX: Acute cough: R05.1

## 2023-06-12 LAB — COMPREHENSIVE METABOLIC PANEL
ALT: 20 U/L (ref 0–53)
AST: 19 U/L (ref 0–37)
Albumin: 4.3 g/dL (ref 3.5–5.2)
Alkaline Phosphatase: 62 U/L (ref 39–117)
BUN: 13 mg/dL (ref 6–23)
CO2: 32 meq/L (ref 19–32)
Calcium: 9.8 mg/dL (ref 8.4–10.5)
Chloride: 104 meq/L (ref 96–112)
Creatinine, Ser: 1.09 mg/dL (ref 0.40–1.50)
GFR: 75.77 mL/min (ref >60.00)
Glucose, Bld: 102 mg/dL — ABNORMAL HIGH (ref 70–99)
Potassium: 4.5 meq/L (ref 3.5–5.1)
Sodium: 142 meq/L (ref 135–145)
Total Bilirubin: 0.6 mg/dL (ref 0.2–1.2)
Total Protein: 6.3 g/dL (ref 6.0–8.3)

## 2023-06-12 LAB — CBC WITH DIFFERENTIAL/PLATELET
Basophils Absolute: 0 10*3/uL (ref 0.0–0.1)
Basophils Relative: 0.2 % (ref 0.0–3.0)
Eosinophils Absolute: 0.3 10*3/uL (ref 0.0–0.7)
Eosinophils Relative: 5.5 % — ABNORMAL HIGH (ref 0.0–5.0)
HCT: 47.8 % (ref 39.0–52.0)
Hemoglobin: 15.8 g/dL (ref 13.0–17.0)
Lymphocytes Relative: 13.9 % (ref 12.0–46.0)
Lymphs Abs: 0.8 10*3/uL (ref 0.7–4.0)
MCHC: 32.9 g/dL (ref 30.0–36.0)
MCV: 93.8 fL (ref 78.0–100.0)
Monocytes Absolute: 0.6 10*3/uL (ref 0.1–1.0)
Monocytes Relative: 10.6 % (ref 3.0–12.0)
Neutro Abs: 3.8 10*3/uL (ref 1.4–7.7)
Neutrophils Relative %: 69.8 % (ref 43.0–77.0)
Platelets: 237 10*3/uL (ref 150.0–400.0)
RBC: 5.1 Mil/uL (ref 4.22–5.81)
RDW: 13.2 % (ref 11.5–15.5)
WBC: 5.4 10*3/uL (ref 4.0–10.5)

## 2023-06-12 LAB — LIPID PANEL
Cholesterol: 103 mg/dL (ref 0–200)
HDL: 45.1 mg/dL (ref >39.00)
LDL Cholesterol: 43 mg/dL (ref 0–99)
NonHDL: 57.96
Total CHOL/HDL Ratio: 2
Triglycerides: 76 mg/dL (ref 0.0–149.0)
VLDL: 15.2 mg/dL (ref 0.0–40.0)

## 2023-06-12 LAB — VITAMIN B12: Vitamin B-12: 446 pg/mL (ref 211–911)

## 2023-06-12 LAB — POC COVID19 BINAXNOW: SARS Coronavirus 2 Ag: NEGATIVE

## 2023-06-12 LAB — TSH: TSH: 2.94 u[IU]/mL (ref 0.35–5.50)

## 2023-06-12 MED ORDER — AZELASTINE HCL 0.1 % NA SOLN
1.0000 | Freq: Two times a day (BID) | NASAL | 12 refills | Status: DC
Start: 2023-06-12 — End: 2024-06-23
  Filled 2023-06-12: qty 30, 90d supply, fill #0
  Filled 2024-03-19: qty 30, 90d supply, fill #1

## 2023-06-12 MED ORDER — VALSARTAN 80 MG PO TABS
80.0000 mg | ORAL_TABLET | Freq: Every day | ORAL | 3 refills | Status: DC
  Filled 2023-06-12 – 2023-08-28 (×2): qty 90, 90d supply, fill #0
  Filled 2023-12-03: qty 90, 90d supply, fill #1
  Filled 2024-02-19: qty 90, 90d supply, fill #2
  Filled 2024-05-21: qty 90, 90d supply, fill #3

## 2023-06-12 NOTE — Assessment & Plan Note (Signed)
Encourage heart healthy diet such as MIND or DASH diet, increase exercise, avoid trans fats, simple carbohydrates and processed foods, consider a krill or fish or flaxseed oil cap daily.  °

## 2023-06-12 NOTE — Assessment & Plan Note (Signed)
Check labs  Con't synthroid 

## 2023-06-12 NOTE — Assessment & Plan Note (Signed)
Astelin nasal spray Con't zyrtec Covid neg Call or return to office if no improvement

## 2023-06-12 NOTE — Assessment & Plan Note (Signed)
Well controlled, no changes to meds. Encouraged heart healthy diet such as the DASH diet and exercise as tolerated.  °

## 2023-06-12 NOTE — Patient Instructions (Signed)

## 2023-06-12 NOTE — Progress Notes (Signed)
Established Patient Office Visit  Subjective   Patient ID: Bobby Gonzalez, male    DOB: 06-07-1966  Age: 57 y.o. MRN: [id number]  Chief Complaint  Patient presents with   Hypothyroidism   Hypertension   Hyperlipidemia   Follow-up   Cough    X1 week, no OTC meds aside from allergy medication   ' HPI Discussed the use of AI scribe software for clinical note transcription with the patient, who gave verbal consent to proceed.  History of Present Illness   The patient, with a history of hypertension managed with valsartan, presents with a cough of 4-5 days duration. He describes the cough as productive with clear sputum. He denies fever, sore throat, and nasal congestion. He has been taking daily Zyrtec for presumed allergies.  In addition, the patient reports recent difficulty with weight management, with a current weight of 195 lbs. Despite dietary modifications, he has not been able to reduce his weight below 185 lbs this year. He expresses concern that his medications may be contributing to his weight gain.  The patient also reports elevated blood pressure readings over the past 1-2 months, with recent readings around 125-130 mmHg, up from previous readings around 110 mmHg.  Furthermore, the patient reports chronic shoulder pain, describing it as a constant ache that disrupts his sleep. He has tried various topical treatments and regular massages, but the pain persists.  Lastly, the patient is scheduled for an open hernia repair surgery in the near future. He expresses curiosity about the possibility of laparoscopic surgery, as a family member had a positive experience with this approach.      Patient Active Problem List   Diagnosis Date Noted   Acute cough 06/12/2023   Pneumonia 11/26/2022   Carotid stenosis 09/05/2022   Asymptomatic carotid artery stenosis, left 09/05/2022   Other fatigue 03/04/2022   Viral upper respiratory tract infection 03/04/2022   Cancer of other and  ill-defined sites within the digestive organs and peritoneum (HCC) 01/18/2022   Hyperlipidemia 05/21/2021   Carotid atherosclerosis 03/01/2021   GERD (gastroesophageal reflux disease)    Dizziness 11/13/2020   Syncope 09/29/2020   Solitary kidney, acquired 09/29/2020   Cardiac murmur 09/29/2020   Malignant tumor of kidney (HCC) 09/27/2020   Personal history of malignant neoplasm of unspecified site of lip, oral cavity, and pharynx 09/27/2020   Personal history of other malignant neoplasm of kidney 09/27/2020   Acquired hypothyroidism 09/27/2020   Solitary pulmonary nodule 09/27/2020   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   Cancer (HCC)    Difficult intubation    Gastro-esophageal reflux disease without esophagitis    History of radiation therapy    Pain    Hearing loss of right ear 05/29/2020   Tinnitus aurium, right 05/29/2020   Essential hypertension 10/11/2019   History of cancer tonsil 09/20/2016   History of head and neck radiation 09/20/2016   Sensorineural hearing loss, bilateral 09/20/2016   Multiple lung nodules on CT 05/16/2014   Dry mouth 05/16/2014   Preventative health care 05/16/2014   Preventive measure 05/16/2014   Hypothyroid 02/15/2013   Inguinal hernia 10/23/2012   Renal cell carcinoma (HCC) 09/09/2012   HTN (hypertension) 06/25/2012   Dehydration 05/13/2012   Phlegm in throat 05/13/2012   Renal mass 03/30/2012   Allergy    Chronic kidney disease    Diverticula of colon 03/10/2012   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 radiation and chemo 05-13-12(Dr. Gaylyn Rong)   Cancer of other and ill-defined sites within the digestive organs and peritoneum (HCC) 01/18/2022   Cardiac murmur 09/29/2020   Carotid  atherosclerosis 03/01/2021   Carotid stenosis 09/05/2022   Chronic kidney disease    right renal mass 7.3x6.7x6.8cm renal cell ca   Dehydration 05/13/2012   Difficult intubation    POSSIBLE DIFFICULT INTUBATION--S/P RADIATION FOR CANCER LEFT TONSIL--EATING BUT THROAT STILL SORE AND PT HAS THICK MUCUS   Diverticula of colon 03/10/2012   multiple rectosigmoid colonic diverticula/ ct abd/pelvis   Dizziness 11/13/2020   Dry mouth 05/16/2014   Essential hypertension 10/11/2019   Gastro-esophageal reflux disease without esophagitis    GERD (gastroesophageal reflux disease)    Hearing loss of right ear 05/29/2020   History of cancer tonsil 09/20/2016   History of head and neck radiation 09/20/2016   History of radiation therapy 03/30/12-05/15/12   left tonsil   HTN (hypertension) 06/25/2012   at first start of chemo for squamous cell cancer of neck-left   Hyperlipidemia 05/21/2021   Hypothyroid 02/15/2013   Inguinal hernia 10/23/2012   Inguinal hernia    right   Malignant tumor of kidney (HCC) 09/27/2020   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   Multiple lung nodules on CT 05/16/2014   Other fatigue 03/04/2022   Pain    JOINT PAINS UPPER BODY AFTER RADIATION TREATMENTS   Personal history of malignant neoplasm of unspecified site of lip, oral cavity, and pharynx 09/27/2020   Personal history of other malignant neoplasm of kidney 09/27/2020   Phlegm in throat 05/13/2012   Pneumonia    Preventative health care 05/16/2014   Preventive measure 05/16/2014   Renal cell carcinoma (HCC) 09/09/2012   Renal mass 03/30/2012   Sensorineural hearing loss, bilateral 09/20/2016   Solitary kidney, acquired 09/29/2020   Solitary pulmonary nodule 09/27/2020   Syncope 09/29/2020   Tinnitus aurium, right 05/29/2020   Tonsil cancer (HCC) 02/2012   SCCa of Left tonsil-S/P biopsy on 02/17/12.   Viral upper respiratory tract infection 03/04/2022   Past Surgical History:  Procedure Laterality Date    DIRECT LARYNGOSCOPY     S/P DL, Biopsy-Dr. Pollyann Kennedy. Pathology postive for SCCa of Left Tonsil   GASTROSTOMY TUBE PLACEMENT  03/02/12   INGUINAL HERNIA REPAIR Left 11/04/2012   Procedure: HERNIA REPAIR INGUINAL ADULT;  Surgeon: Clovis Pu. Cornett, MD;  Location: [facility];  Service: General;  Laterality: Left;   INSERTION OF MESH Left 11/04/2012   Procedure: INSERTION OF MESH;  Surgeon: Clovis Pu. Cornett, MD;  Location: [facility];  Service: General;  Laterality: Left;   LAPAROSCOPIC NEPHRECTOMY  06/29/2012   Procedure: LAPAROSCOPIC NEPHRECTOMY;  Surgeon: Crecencio Mc, MD;  Location: [facility];  Service: Urology;  Laterality: Right;   MULTIPLE TOOTH EXTRACTIONS  03/04/12   with DR. Mohorn   PEG TUBE PLACEMENT  06-25-12   remains in place abdomen-not using at present.   PEG TUBE REMOVAL  10/2012   tonsil biopsy  02/17/12   SCCa left tomnsil,HPV positive,spread to l cervical node   TRANSCAROTID ARTERY REVASCULARIZATION  Left 09/05/2022   Procedure: Left Transcarotid Artery Revascularization;  Surgeon: Maeola Harman, MD;  Location: [facility] OR;  Service: Vascular;  Laterality: Left;   ULTRASOUND GUIDANCE FOR VASCULAR ACCESS Right 09/05/2022  Procedure: ULTRASOUND GUIDANCE FOR VASCULAR ACCESS RIGHT FEMORAL ARTERY;  Surgeon: Maeola Harman, MD;  Location: [facility] OR;  Service: Vascular;  Laterality: Right;   WISDOM TOOTH EXTRACTION     Social History   Tobacco Use   Smoking status: Never   Smokeless tobacco: Never  Vaping Use   Vaping status: Never Used  Substance Use Topics   Alcohol use: No    Comment: Never drank alcohol   Drug use: No   Social History   Socioeconomic History   Marital status: Married    Spouse name: Not on file   Number of children: 2   Years of education: Not on file   Highest education level: Associate degree: academic program  Occupational History    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   Right handed   Caffeine: no coffee or soda. Tea in the weekend, drinks mainly flavored water due to only having 1 kidney   Social Determinants of Health   Financial Resource Strain: Not on file  Food Insecurity: Not on file  Transportation Needs: Not on file  Physical Activity: Not on file  Stress: Not on file  Social Connections: Not on file  Intimate Partner Violence: Not on file   Family Status  Relation Name Status   Father  Alive   Mother  Deceased at age 85       osteoporosis complictions   Brother  Deceased   Brother  Alive   Neg Hx  (Not Specified)  No partnership data on file   Family History  Problem Relation Age of Onset   Stroke Father    Cancer Father        Prostate   Hypertension Father    Thyroid disease Brother    Hypertension Brother    Throat cancer Brother    Colon cancer Neg Hx    Esophageal cancer Neg Hx    Rectal cancer Neg Hx    Stomach cancer Neg Hx    Allergies  Allergen Reactions   Banana Shortness Of Breath, Rash and Other (See Comments)    Sneezing    Penicillins Other (See Comments)    Unknown per Pt       ROS    Objective:     BP 130/80 (BP Location: Left Arm, Patient Position: Sitting, Cuff Size: Normal)   Pulse (!) 53   Temp 98.2 F (36.8 C) (Oral)   Resp 18   Ht 5\' 11"  (1.803 m)   Wt 195 lb 6.4 oz (88.6 kg)   SpO2 99%   BMI 27.25 kg/m  BP Readings from Last 3 Encounters:  06/12/23 130/80  12/05/22 120/82  11/25/22 (!) 148/90   Wt Readings from Last 3 Encounters:  06/12/23 195 lb 6.4 oz (88.6 kg)  12/05/22 189 lb 1.9 oz (85.8 kg)   11/25/22 189 lb 9.6 oz (86 kg)   SpO2 Readings from Last 3 Encounters:  06/12/23 99%  12/05/22 97%  11/25/22  98%      Physical Exam   Results for orders placed or performed in visit on 06/12/23  POC COVID-19  Result Value Ref Range   SARS Coronavirus 2 Ag Negative Negative    Last CBC Lab Results  Component Value Date   WBC 3.7 (L) 11/25/2022   HGB 15.8 11/25/2022   HCT 47.2 11/25/2022   MCV 92.0 11/25/2022   MCH 30.7 09/06/2022   RDW 13.4 11/25/2022   PLT 228.0 11/25/2022   Last metabolic panel Lab Results  Component Value Date   GLUCOSE 106 (H) 11/25/2022   NA 140 11/25/2022   K 4.5 11/25/2022   CL 103 11/25/2022   CO2 31 11/25/2022   BUN 11 11/25/2022   CREATININE 1.19 11/25/2022   GFR 68.45 11/25/2022   CALCIUM 9.4 11/25/2022   PROT 6.6 11/25/2022   ALBUMIN 4.4 11/25/2022   BILITOT 0.5 11/25/2022   ALKPHOS 65 11/25/2022   AST 26 11/25/2022   ALT 30 11/25/2022   ANIONGAP 9 09/06/2022   Last lipids Lab Results  Component Value Date   CHOL 100 11/25/2022   HDL 43.70 11/25/2022   LDLCALC 44 11/25/2022   TRIG 64.0 11/25/2022   CHOLHDL 2 11/25/2022   Last hemoglobin A1c Lab Results  Component Value Date   HGBA1C 5.9 11/25/2022   Last thyroid functions Lab Results  Component Value Date   TSH 3.87 11/25/2022   T4TOTAL 8.7 05/21/2021   Last vitamin D Lab Results  Component Value Date   VD25OH 40.14 03/04/2022   Last vitamin B12 and Folate Lab Results  Component Value Date   VITAMINB12 450 11/25/2022      The ASCVD Risk score (Arnett DK, et al., 2019) failed to calculate for the following reasons:   The valid total cholesterol range is 130 to 320 mg/dL    Assessment & Plan:   Problem List Items Addressed This Visit       Unprioritized   Viral upper respiratory tract infection    Astelin nasal spray Con't zyrtec Covid neg Call or return to office if no improvement       Relevant Medications   azelastine (ASTELIN) 0.1 %  nasal spray   Hypothyroid    Check labs  Con't synthroid      Relevant Orders   TSH   Hyperlipidemia - Primary    Encourage heart healthy diet such as MIND or DASH diet, increase exercise, avoid trans fats, simple carbohydrates and processed foods, consider a krill or fish or flaxseed oil cap daily.        Relevant Medications   valsartan (DIOVAN) 80 MG tablet   Other Relevant Orders   Comprehensive metabolic panel   Lipid panel   HTN (hypertension)    Well controlled, no changes to meds. Encouraged heart healthy diet such as the DASH diet and exercise as tolerated.        Relevant Medications   valsartan (DIOVAN) 80 MG tablet   Acute cough   Relevant Orders   POC COVID-19 (Completed)   Other Visit Diagnoses     B12 deficiency       Relevant Orders   CBC with Differential/Platelet   Vitamin B12   Need for influenza vaccination       Relevant Orders   Flu vaccine trivalent PF, 6mos and older(Flulaval,Afluria,Fluarix,Fluzone) (Completed)     Assessment and Plan    Cough Productive cough for 4-5 days with clear sputum. No fever or sore throat. Negative COVID-19 test. -  Add acetylcysteine to current allergy regimen. -Continue Zyrtec daily.  Weight Gain Recent difficulty with weight management, despite dietary changes. Concerns about possible medication side effects. -Check thyroid function to assess for possible hypothyroidism.  Hypertension Recent increase in blood pressure readings, up to 130/80 from previous average of 110/70. -Continue current antihypertensive medication (Valsartan). -Refill Valsartan prescription at [facility].  Musculoskeletal Pain Chronic shoulder and neck pain, not relieved by massage or topical analgesics. -Refer to chiropractor (Dr. Christell Faith) for evaluation and possible treatment.  Inguinal Hernia Scheduled for open repair on 08/25/2023. -No changes to current plan.  General Health Maintenance -Administer influenza vaccine today.       C   Return in about 6 months (around 12/11/2023), or if symptoms worsen or fail to improve, for fasting, annual exam.    Donato Schultz, DO

## 2023-06-16 ENCOUNTER — Other Ambulatory Visit (HOSPITAL_COMMUNITY): Payer: Self-pay

## 2023-07-03 ENCOUNTER — Other Ambulatory Visit: Payer: Self-pay | Admitting: Nephrology

## 2023-07-03 DIAGNOSIS — C641 Malignant neoplasm of right kidney, except renal pelvis: Secondary | ICD-10-CM | POA: Diagnosis not present

## 2023-07-03 DIAGNOSIS — N183 Chronic kidney disease, stage 3 unspecified: Secondary | ICD-10-CM

## 2023-07-03 DIAGNOSIS — N2581 Secondary hyperparathyroidism of renal origin: Secondary | ICD-10-CM | POA: Diagnosis not present

## 2023-07-03 DIAGNOSIS — I129 Hypertensive chronic kidney disease with stage 1 through stage 4 chronic kidney disease, or unspecified chronic kidney disease: Secondary | ICD-10-CM | POA: Diagnosis not present

## 2023-07-07 ENCOUNTER — Other Ambulatory Visit: Payer: Self-pay | Admitting: Family Medicine

## 2023-07-07 ENCOUNTER — Other Ambulatory Visit: Payer: Self-pay

## 2023-07-07 ENCOUNTER — Other Ambulatory Visit (HOSPITAL_COMMUNITY): Payer: Self-pay

## 2023-07-07 DIAGNOSIS — E039 Hypothyroidism, unspecified: Secondary | ICD-10-CM

## 2023-07-07 MED ORDER — LEVOTHYROXINE SODIUM 88 MCG PO TABS
88.0000 ug | ORAL_TABLET | Freq: Every day | ORAL | 1 refills | Status: DC
  Filled 2023-07-07: qty 90, 90d supply, fill #0

## 2023-07-09 ENCOUNTER — Ambulatory Visit: Payer: 59 | Admitting: Physician Assistant

## 2023-07-09 ENCOUNTER — Ambulatory Visit (HOSPITAL_COMMUNITY)
Admission: RE | Admit: 2023-07-09 | Discharge: 2023-07-09 | Disposition: A | Discharge status: Home / Self Care | Payer: 59 | Source: Ambulatory Visit | Attending: Vascular Surgery | Admitting: Vascular Surgery

## 2023-07-09 VITALS — BP 143/90 | HR 58 | Temp 98.0°F | Ht 71.0 in | Wt 190.4 lb

## 2023-07-09 DIAGNOSIS — I6522 Occlusion and stenosis of left carotid artery: Secondary | ICD-10-CM

## 2023-07-09 NOTE — Progress Notes (Signed)
Office Note   History of Present Illness   Bobby Gonzalez is a 57 y.o. (03-29-1966) male who presents for surveillance of carotid artery stenosis. He has a history of left TCAR on 09/05/2022 by Dr.Cain. This was done for asymptomatic critical left ICA stenosis.  The patient returns today for follow up. He denies any recent CVA or TIA diagnosis. He also denies any recent strokelike symptoms such as slurred speech, facial droop, sudden visual changes, or sudden weakness/numbness.  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  Current Outpatient Medications  Medication Sig Dispense Refill   aspirin EC 81 MG tablet Take 81 mg by mouth daily.     azelastine (ASTELIN) 0.1 % nasal spray Place 1 spray into both nostrils 2 (two) times daily as directed 30 mL 12   cetirizine (ZYRTEC) 10 MG tablet Take 10 mg by mouth daily.     clopidogrel (PLAVIX) 75 MG tablet Take 1 tablet (75 mg total) by mouth daily. 30 tablet 6   levothyroxine (SYNTHROID) 88 MCG tablet Take 1 tablet (88 mcg total) by mouth daily before breakfast. 90 tablet 1   Multiple Vitamin (MULTIVITAMIN) tablet Take 1 tablet by mouth daily.     pilocarpine (SALAGEN) 5 MG tablet Take 1 tablet (5 mg total) by mouth 3 (three) times daily. 90 tablet 3   rosuvastatin (CRESTOR) 10 MG tablet Take 1 tablet (10 mg total) by mouth daily. 90 tablet 3   valsartan (DIOVAN) 80 MG tablet Take 1 tablet (80 mg total) by mouth daily. 90 tablet 3   No current facility-administered medications for this visit.    REVIEW OF SYSTEMS (negative unless checked):   Cardiac:  []  Chest pain or chest pressure? []  Shortness of breath upon activity? []  Shortness of breath when lying flat? []  Irregular heart rhythm?  Vascular:  []  Pain in calf, thigh, or hip brought on by walking? []  Pain in feet at night that wakes you up from your sleep? []  Blood clot in your veins? []  Leg swelling?  Pulmonary:  []  Oxygen at home? []   Productive cough? []  Wheezing?  Neurologic:  []  Sudden weakness in arms or legs? []  Sudden numbness in arms or legs? []  Sudden onset of difficult speaking or slurred speech? []  Temporary loss of vision in one eye? []  Problems with dizziness?  Gastrointestinal:  []  Blood in stool? []  Vomited blood?  Genitourinary:  []  Burning when urinating? []  Blood in urine?  Psychiatric:  []  Major depression  Hematologic:  []  Bleeding problems? []  Problems with blood clotting?  Dermatologic:  []  Rashes or ulcers?  Constitutional:  []  Fever or chills?  Ear/Nose/Throat:  []  Change in hearing? []  Nose bleeds? []  Sore throat?  Musculoskeletal:  []  Back pain? []  Joint pain? []  Muscle pain?   Physical Examination   Vitals:   07/09/23 0843  BP: (!) 143/90  Pulse: (!) 58  Temp: 98 F (36.7 C)  SpO2: 96%  Weight: 190 lb 6.4 oz (86.4 kg)  Height: 5\' 11"  (1.803 m)   Body mass index is 26.56 kg/m.  General:  WDWN in NAD; vital signs documented above Gait: Not observed HENT: WNL, normocephalic Pulmonary: normal non-labored breathing , without rales, rhonchi,  wheezing Cardiac: regular, without carotid bruit Abdomen: soft, NT, no masses Skin: without rashes Vascular Exam/Pulses: palpable radial pulses bilaterally Extremities: without ischemic changes, without gangrene , without cellulitis; without open wounds;  Musculoskeletal: no muscle wasting or atrophy  Neurologic: A&O  X 3;  No focal weakness or paresthesias are detected Psychiatric:  The pt has Normal affect.  Non-Invasive Vascular Imaging   Bilateral Carotid Duplex (07/09/2023):  R ICA stenosis:   none R VA:  patent and antegrade L ICA stenosis:   patent stent L VA:  patent and antegrade   Medical Decision Making   Bobby Gonzalez is a 57 y.o. male who presents for surveillance of carotid artery stenosis  Based on the patient's vascular studies, their carotid artery stenosis is unchanged. His right ICA has  no stenosis. His left carotid stent is patent without restenosis He denies any strokelike symptoms such as slurred speech, facial droop, sudden visual changes, or sudden weakness/numbness. He also denies any claudication, tissue loss, or rest pain He has palpable and equal radial pulses bilaterally. He has palpable DP pulses  He will continue his asa, plavix, and statin and follow up with our office in 1 year with carotid duplex   Bobby Dubonnet PA-C [facility] Office: [phone]  Clinic MD: Randie Heinz

## 2023-07-10 ENCOUNTER — Other Ambulatory Visit (HOSPITAL_COMMUNITY): Payer: Self-pay

## 2023-07-15 ENCOUNTER — Other Ambulatory Visit (HOSPITAL_COMMUNITY): Payer: Self-pay

## 2023-07-24 ENCOUNTER — Other Ambulatory Visit: Payer: Self-pay

## 2023-07-24 DIAGNOSIS — I6522 Occlusion and stenosis of left carotid artery: Secondary | ICD-10-CM

## 2023-07-25 ENCOUNTER — Ambulatory Visit
Admission: RE | Admit: 2023-07-25 | Discharge: 2023-07-25 | Disposition: A | Discharge status: Home / Self Care | Payer: 59 | Source: Ambulatory Visit | Attending: Nephrology | Admitting: Nephrology

## 2023-07-25 DIAGNOSIS — N1831 Chronic kidney disease, stage 3a: Secondary | ICD-10-CM | POA: Diagnosis not present

## 2023-07-25 DIAGNOSIS — Z85528 Personal history of other malignant neoplasm of kidney: Secondary | ICD-10-CM | POA: Diagnosis not present

## 2023-07-25 DIAGNOSIS — N183 Chronic kidney disease, stage 3 unspecified: Secondary | ICD-10-CM

## 2023-07-25 DIAGNOSIS — Z905 Acquired absence of kidney: Secondary | ICD-10-CM | POA: Diagnosis not present

## 2023-08-04 ENCOUNTER — Ambulatory Visit (INDEPENDENT_AMBULATORY_CARE_PROVIDER_SITE_OTHER): Payer: 59 | Admitting: Family Medicine

## 2023-08-04 ENCOUNTER — Other Ambulatory Visit (HOSPITAL_BASED_OUTPATIENT_CLINIC_OR_DEPARTMENT_OTHER): Payer: Self-pay

## 2023-08-04 ENCOUNTER — Encounter: Payer: Self-pay | Admitting: Family Medicine

## 2023-08-04 ENCOUNTER — Ambulatory Visit (HOSPITAL_BASED_OUTPATIENT_CLINIC_OR_DEPARTMENT_OTHER)
Admission: RE | Admit: 2023-08-04 | Discharge: 2023-08-04 | Disposition: A | Discharge status: Home / Self Care | Payer: 59 | Source: Ambulatory Visit | Attending: Family Medicine | Admitting: Family Medicine

## 2023-08-04 VITALS — BP 148/100 | HR 76 | Temp 98.5°F | Resp 18 | Ht 71.0 in | Wt 197.0 lb

## 2023-08-04 DIAGNOSIS — J40 Bronchitis, not specified as acute or chronic: Secondary | ICD-10-CM | POA: Insufficient documentation

## 2023-08-04 DIAGNOSIS — R918 Other nonspecific abnormal finding of lung field: Secondary | ICD-10-CM | POA: Diagnosis not present

## 2023-08-04 DIAGNOSIS — R059 Cough, unspecified: Secondary | ICD-10-CM | POA: Diagnosis not present

## 2023-08-04 MED ORDER — DOXYCYCLINE HYCLATE 100 MG PO TABS
100.0000 mg | ORAL_TABLET | Freq: Two times a day (BID) | ORAL | 0 refills | Status: DC
  Filled 2023-08-04: qty 20, 10d supply, fill #0

## 2023-08-04 MED ORDER — PROMETHAZINE-DM 6.25-15 MG/5ML PO SYRP
5.0000 mL | ORAL_SOLUTION | Freq: Four times a day (QID) | ORAL | 0 refills | Status: DC | PRN
  Filled 2023-08-04: qty 118, 6d supply, fill #0

## 2023-08-04 MED ORDER — PREDNISONE 10 MG PO TABS
ORAL_TABLET | ORAL | 0 refills | Status: AC
  Filled 2023-08-04: qty 20, 12d supply, fill #0

## 2023-08-04 NOTE — Progress Notes (Signed)
Established Patient Office Visit  Subjective   Patient ID: Bobby Gonzalez, male    DOB: 1966-06-06  Age: 57 y.o. MRN: [id number]  Chief Complaint  Patient presents with   Cough    Pt states sxs going on since last month, some productive cough, chest tightness. Pt states only having a fever on day one. Negative COVID x2     HPI Discussed the use of AI scribe software for clinical note transcription with the patient, who gave verbal consent to proceed.  History of Present Illness   The patient, with a history of hypertension, presents with a persistent cough and cold symptoms that began after working in a dusty environment. The cough onset was sudden, occurring the same night he was exposed to the dust, and was initially associated with a fever of 101 degrees. The cough has been productive, with the patient noting the expectoration of mucus and some 'cold.' The cough has been severe enough to cause chest discomfort and has been disturbing his sleep. The patient also reports some chest tightness. He has been self-medicating with over-the-counter [facility] Daytime and Nighttime cold medicines, but stopped due to concerns about elevated blood pressure. The patient also has a hernia, which is scheduled for surgical repair, and the coughing has been exacerbating the discomfort from this condition.      Patient Active Problem List   Diagnosis Date Noted   Acute cough 06/12/2023   Pneumonia 11/26/2022   Carotid stenosis 09/05/2022   Asymptomatic carotid artery stenosis, left 09/05/2022   Other fatigue 03/04/2022   Viral upper respiratory tract infection 03/04/2022   Cancer of other and ill-defined sites within the digestive organs and peritoneum (HCC) 01/18/2022   Hyperlipidemia 05/21/2021   Carotid atherosclerosis 03/01/2021   GERD (gastroesophageal reflux disease)    Dizziness 11/13/2020   Syncope 09/29/2020   Solitary kidney, acquired 09/29/2020   Cardiac murmur 09/29/2020   Malignant tumor  of kidney (HCC) 09/27/2020   Personal history of malignant neoplasm of unspecified site of lip, oral cavity, and pharynx 09/27/2020   Personal history of other malignant neoplasm of kidney 09/27/2020   Acquired hypothyroidism 09/27/2020   Solitary pulmonary nodule 09/27/2020   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   Cancer (HCC)    Difficult intubation    Gastro-esophageal reflux disease without esophagitis    History of radiation therapy    Pain    Hearing loss of right ear 05/29/2020   Tinnitus aurium, right 05/29/2020   Essential hypertension 10/11/2019   History of cancer tonsil 09/20/2016   History of head and neck radiation 09/20/2016   Sensorineural hearing loss, bilateral 09/20/2016   Multiple lung nodules on CT 05/16/2014   Dry mouth 05/16/2014   Preventative health care 05/16/2014   Preventive measure 05/16/2014   Hypothyroid 02/15/2013   Inguinal hernia 10/23/2012   Renal cell carcinoma (HCC) 09/09/2012   HTN (hypertension) 06/25/2012   Dehydration 05/13/2012   Phlegm in throat 05/13/2012   Renal mass 03/30/2012   Allergy    Chronic kidney disease    Diverticula of colon 03/10/2012   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 radiation and chemo 05-13-12(Dr. Gaylyn Rong)   Cancer of other and ill-defined sites within the  digestive organs and peritoneum (HCC) 01/18/2022   Cardiac murmur 09/29/2020   Carotid atherosclerosis 03/01/2021   Carotid stenosis 09/05/2022   Chronic kidney disease    right renal mass 7.3x6.7x6.8cm renal cell ca   Dehydration 05/13/2012   Difficult intubation    POSSIBLE DIFFICULT INTUBATION--S/P RADIATION FOR CANCER LEFT TONSIL--EATING BUT THROAT STILL SORE AND PT HAS THICK MUCUS   Diverticula  of colon 03/10/2012   multiple rectosigmoid colonic diverticula/ ct abd/pelvis   Dizziness 11/13/2020   Dry mouth 05/16/2014   Essential hypertension 10/11/2019   Gastro-esophageal reflux disease without esophagitis    GERD (gastroesophageal reflux disease)    Hearing loss of right ear 05/29/2020   History of cancer tonsil 09/20/2016   History of head and neck radiation 09/20/2016   History of radiation therapy 03/30/12-05/15/12   left tonsil   HTN (hypertension) 06/25/2012   at first start of chemo for squamous cell cancer of neck-left   Hyperlipidemia 05/21/2021   Hypothyroid 02/15/2013   Inguinal hernia 10/23/2012   Inguinal hernia    right   Malignant tumor of kidney (HCC) 09/27/2020   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   Multiple lung nodules on CT 05/16/2014   Other fatigue 03/04/2022   Pain    JOINT PAINS UPPER BODY AFTER RADIATION TREATMENTS   Personal history of malignant neoplasm of unspecified site of lip, oral cavity, and pharynx 09/27/2020   Personal history of other malignant neoplasm of kidney 09/27/2020   Phlegm in throat 05/13/2012   Pneumonia    Preventative health care 05/16/2014   Preventive measure 05/16/2014   Renal cell carcinoma (HCC) 09/09/2012   Renal mass 03/30/2012   Sensorineural hearing loss, bilateral 09/20/2016   Solitary kidney, acquired 09/29/2020   Solitary pulmonary nodule 09/27/2020   Syncope 09/29/2020   Tinnitus aurium, right 05/29/2020   Tonsil cancer (HCC) 02/2012   SCCa of Left tonsil-S/P biopsy on 02/17/12.   Viral upper respiratory tract infection 03/04/2022   Past Surgical History:  Procedure Laterality Date   DIRECT LARYNGOSCOPY     S/P DL, Biopsy-Dr. Pollyann Kennedy. Pathology postive for SCCa of Left Tonsil   GASTROSTOMY TUBE PLACEMENT  03/02/12   INGUINAL HERNIA REPAIR Left 11/04/2012   Procedure: HERNIA REPAIR INGUINAL ADULT;  Surgeon: Clovis Pu. Cornett, MD;  Location: [facility];  Service: General;  Laterality: Left;   INSERTION  OF MESH Left 11/04/2012   Procedure: INSERTION OF MESH;  Surgeon: Clovis Pu. Cornett, MD;  Location: [facility];  Service: General;  Laterality: Left;   LAPAROSCOPIC NEPHRECTOMY  06/29/2012   Procedure: LAPAROSCOPIC NEPHRECTOMY;  Surgeon: Crecencio Mc, MD;  Location: [facility];  Service: Urology;  Laterality: Right;   MULTIPLE TOOTH EXTRACTIONS  03/04/12   with DR. Mohorn   PEG TUBE PLACEMENT  06-25-12   remains in place abdomen-not using at present.   PEG TUBE REMOVAL  10/2012   tonsil biopsy  02/17/12   SCCa left tomnsil,HPV positive,spread to l cervical node   TRANSCAROTID ARTERY REVASCULARIZATION  Left 09/05/2022   Procedure: Left Transcarotid Artery Revascularization;  Surgeon: Maeola Harman, MD;  Location: [facility] OR;  Service: Vascular;  Laterality: Left;   ULTRASOUND GUIDANCE FOR VASCULAR ACCESS Right 09/05/2022   Procedure: ULTRASOUND GUIDANCE FOR VASCULAR ACCESS RIGHT FEMORAL ARTERY;  Surgeon: Maeola Harman, MD;  Location: [facility] OR;  Service: Vascular;  Laterality: Right;   WISDOM TOOTH EXTRACTION     Social History   Tobacco Use   Smoking status: Never   Smokeless tobacco:  Never  Vaping Use   Vaping status: Never Used  Substance Use Topics   Alcohol use: No    Comment: Never drank alcohol   Drug use: No   Social History   Socioeconomic History   Marital status: Married    Spouse name: Not on file   Number of children: 2   Years of education: Not on file   Highest education level: Associate degree: academic program  Occupational History    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   Right handed   Caffeine: no coffee or soda. Tea in the weekend, drinks mainly flavored water due to only having 1 kidney   Social Determinants of Health   Financial Resource Strain: Not on file  Food Insecurity: Not on file  Transportation Needs: Not on file  Physical Activity: Not on file  Stress: Not on file  Social Connections: Not on file  Intimate Partner Violence: Not on file   Family Status  Relation Name Status   Father  Alive   Mother  Deceased at age 51       osteoporosis complictions   Brother  Deceased   Brother  Alive   Neg Hx  (Not Specified)  No partnership data on file   Family History  Problem Relation Age of Onset   Stroke Father    Cancer Father        Prostate   Hypertension Father    Thyroid disease Brother    Hypertension Brother    Throat cancer Brother    Colon cancer Neg Hx    Esophageal cancer Neg Hx    Rectal cancer Neg Hx    Stomach cancer Neg Hx    Allergies  Allergen Reactions   Banana Shortness Of Breath, Rash and Other (See Comments)    Sneezing    Penicillins Other (See Comments)    Unknown per Pt       Review of Systems  Constitutional:  Negative for fever and malaise/fatigue.  HENT:  Positive for congestion.   Eyes:  Negative for blurred vision.  Respiratory:  Positive for cough, sputum production and shortness of breath.   Cardiovascular:  Negative for chest pain, palpitations and leg swelling.  Gastrointestinal:  Negative for vomiting.  Musculoskeletal:  Negative for back pain.  Skin:  Negative for rash.  Neurological:  Negative for loss of consciousness and headaches.      Objective:     BP (!) 148/100 (BP Location: Left Arm, Patient Position: Sitting, Cuff Size: Normal)   Pulse 76   Temp 98.5 F (36.9 C) (Oral)   Resp 18   Ht 5\' 11"  (1.803 m)   Wt 197 lb (89.4 kg)   SpO2 97%   BMI 27.48 kg/m  BP Readings from Last 3 Encounters:  08/04/23  (!) 148/100  07/09/23 (!) 143/90  06/12/23 130/80   Wt Readings from Last 3 Encounters:  08/04/23 197 lb (89.4 kg)  07/09/23 190 lb 6.4  oz (86.4 kg)  06/12/23 195 lb 6.4 oz (88.6 kg)   SpO2 Readings from Last 3 Encounters:  08/04/23 97%  07/09/23 96%  06/12/23 99%      Physical Exam Vitals and nursing note reviewed.  Constitutional:      General: He is not in acute distress.    Appearance: Normal appearance. He is well-developed.  HENT:     Head: Normocephalic and atraumatic.     Right Ear: Tympanic membrane, ear canal and external ear normal. There is no impacted cerumen.     Left Ear: Tympanic membrane, ear canal and external ear normal. There is no impacted cerumen.     Nose: Nose normal.     Mouth/Throat:     Mouth: Mucous membranes are moist.     Pharynx: Oropharynx is clear. No oropharyngeal exudate or posterior oropharyngeal erythema.  Eyes:     General: No scleral icterus.       Right eye: No discharge.        Left eye: No discharge.     Conjunctiva/sclera: Conjunctivae normal.     Pupils: Pupils are equal, round, and reactive to light.  Neck:     Thyroid: No thyromegaly.     Vascular: No JVD.  Cardiovascular:     Rate and Rhythm: Normal rate and regular rhythm.     Heart sounds: Normal heart sounds. No murmur heard. Pulmonary:     Effort: Pulmonary effort is normal. No respiratory distress.     Breath sounds: Rhonchi present.  Abdominal:     General: Bowel sounds are normal. There is no distension.     Palpations: Abdomen is soft. There is no mass.     Tenderness: There is no abdominal tenderness. There is no guarding or rebound.  Musculoskeletal:        General: Normal range of motion.     Cervical back: Normal range of motion and neck supple.     Right lower leg: No edema.     Left lower leg: No edema.  Lymphadenopathy:     Cervical: No cervical adenopathy.  Skin:    General: Skin is warm and dry.     Findings: No erythema or rash.  Neurological:      Mental Status: He is alert and oriented to person, place, and time.     Cranial Nerves: No cranial nerve deficit.     Motor: No abnormal muscle tone.     Deep Tendon Reflexes: Reflexes are normal and symmetric. Reflexes normal.  Psychiatric:        Mood and Affect: Mood normal.        Behavior: Behavior normal.        Thought Content: Thought content normal.        Judgment: Judgment normal.      No results found for any visits on 08/04/23.  Last CBC Lab Results  Component Value Date   WBC 5.4 06/12/2023   HGB 15.8 06/12/2023   HCT 47.8 06/12/2023   MCV 93.8 06/12/2023   MCH 30.7 09/06/2022   RDW 13.2 06/12/2023   PLT 237.0 06/12/2023   Last metabolic panel Lab Results  Component Value Date   GLUCOSE 102 (H) 06/12/2023   NA 142 06/12/2023   K 4.5 06/12/2023   CL 104 06/12/2023   CO2 32 06/12/2023   BUN 13 06/12/2023   CREATININE 1.09 06/12/2023   GFR 75.77 06/12/2023   CALCIUM 9.8 06/12/2023   PROT 6.3 06/12/2023   ALBUMIN 4.3  06/12/2023   BILITOT 0.6 06/12/2023   ALKPHOS 62 06/12/2023   AST 19 06/12/2023   ALT 20 06/12/2023   ANIONGAP 9 09/06/2022   Last lipids Lab Results  Component Value Date   CHOL 103 06/12/2023   HDL 45.10 06/12/2023   LDLCALC 43 06/12/2023   TRIG 76.0 06/12/2023   CHOLHDL 2 06/12/2023   Last hemoglobin A1c Lab Results  Component Value Date   HGBA1C 5.9 11/25/2022   Last thyroid functions Lab Results  Component Value Date   TSH 2.94 06/12/2023   T4TOTAL 8.7 05/21/2021   Last vitamin D Lab Results  Component Value Date   VD25OH 40.14 03/04/2022   Last vitamin B12 and Folate Lab Results  Component Value Date   VITAMINB12 446 06/12/2023      The ASCVD Risk score (Arnett DK, et al., 2019) failed to calculate for the following reasons:   The valid total cholesterol range is 130 to 320 mg/dL    Assessment & Plan:   Problem List Items Addressed This Visit   None Visit Diagnoses     Bronchitis    -  Primary    Relevant Medications   doxycycline (VIBRA-TABS) 100 MG tablet   promethazine-dextromethorphan (PROMETHAZINE-DM) 6.25-15 MG/5ML syrup   predniSONE (DELTASONE) 10 MG tablet   Other Relevant Orders   DG Chest 2 View     Assessment and Plan    Acute Bronchitis Since November 23, he has experienced persistent cough and cold symptoms, including a productive cough, chest pain, sleep disturbance, and an initial fever of 101F. A physical exam revealed rumbling sounds on his left chest. The differential diagnosis includes pneumonia, necessitating a chest x-ray. We discussed the risks of untreated bronchitis potentially leading to pneumonia and the benefits of prednisone and cough syrup. We will prescribe prednisone and cough syrup (C, C, C, C, C, Cycline), order a chest x-ray, and advise him to avoid decongestants due to hypertension. A recheck of his blood pressure is also planned.  Hypertension His blood pressure readings were elevated at 160/100 mmHg and 148/100 mmHg, likely exacerbated by [facility] daytime and nighttime medications containing decongestants. We discussed the risks of sustained hypertension and potential cardiovascular complications, advising the discontinuation of these medications. A recheck of his blood pressure is advised.  Inguinal Hernia His coughing exacerbates discomfort from an inguinal hernia. Surgery is scheduled for December 23, contingent on resolving the bronchitis. We discussed the importance of resolving bronchitis to proceed with surgery and the risks of delay. We will proceed with the scheduled surgery on December 23 if he is not sick.  Follow-up We advised him to call if there is no improvement before the end of the week and to follow up on chest x-ray results.        No follow-ups on file.    Donato Schultz, DO

## 2023-08-05 ENCOUNTER — Encounter: Payer: Self-pay | Admitting: Family Medicine

## 2023-08-05 NOTE — Progress Notes (Signed)
Sent message, via epic in basket, requesting orders in epic from surgeon.  

## 2023-08-08 NOTE — Progress Notes (Signed)
Second request for pre op orders in [facility]: Spoke with Joni Reining

## 2023-08-10 NOTE — Progress Notes (Signed)
DIFFICULT INTUBATION- Tonsillar cancer / radiation and surgery (had glidescope on 09-05-22 TCAR, no difficulty mentioned).    COVID Vaccine received:  []  No [x]  Yes Date of any COVID positive Test in last 90 days:  PCP - Seabron Spates, DO    Cardiologist - Belva Crome, MD (LOV 12-05-2022) Vascular-  Lemar Livings, MD [facility] [place], [place]  Theron Arista 07-09-2023)  ENT- Serena Colonel, MD   Chest x-ray - 08-04-2023  2v  Epic EKG - 12-05-2022  Epic  Stress Test -  ECHO -10-30-2020  Epic  Cardiac Cath -   PCR screen: []  Ordered & Completed []   No Order but Needs PROFEND     [x]   N/A for this surgery  Surgery Plan:  [x]  Ambulatory   []  Outpatient in bed  []  Admit Anesthesia:    []  General  []  Spinal  [x]   Choice []   MAC  Bowel Prep - [x]  No  []   Yes ______  Pacemaker / ICD device [x]  No []  Yes   Spinal Cord Stimulator:[x]  No []  Yes       History of Sleep Apnea? [x]  No []  Yes   CPAP used?- [x]  No []  Yes    Does the patient monitor blood sugar?   [x]  N/A   []  No []  Yes  Patient has: [x]  NO Hx DM   []  Pre-DM   []  DM1  []   DM2 Last A1c was 5.9 normal  on  11-25-22     Blood Thinner / Instructions:  PLavix   Hold x 5 days per Aspirin Instructions:  ASA 81 mg  ERAS Protocol Ordered: []  No  []  Yes PRE-SURGERY []  ENSURE  []  G2   []  No Drink Ordered  Patient is to be NPO after:   NO ORDERS AVAILABLE   Dental hx: []  Dentures:  []  N/A      []  Bridge or Partial:                   []  Loose or Damaged teeth:   Comments: NO ORDERS,  Tamika 1 IB and spoke with Joni Reining on 08-08-23.   Activity level: Patient is able / unable to climb a flight of stairs without difficulty; []  No CP  []  No SOB, but would have ___   Patient can / can not perform ADLs without assistance.   Anesthesia review: ?Difficult intubation s/p Tonsil surgery / radiation for squamous cell cancer, s/p Right nephrectomy for renal cell cancer 06-29-2012- has CKD now, Heart Murmur (ECHO 10-30-2020), HOH-  GERD, s/p left TCAR  09-05-2022  Patient denies shortness of breath, fever, cough and chest pain at PAT appointment.  Patient verbalized understanding and agreement to the Pre-Surgical Instructions that were given to them at this PAT appointment. Patient was also educated of the need to review these PAT instructions again prior to his surgery.I reviewed the appropriate phone numbers to call if they have any and questions or concerns.

## 2023-08-10 NOTE — Patient Instructions (Signed)
SURGICAL WAITING ROOM VISITATION Patients having surgery or a procedure may have no more than 2 support people in the waiting area - these visitors may rotate in the visitor waiting room.   Due to an increase in RSV and influenza rates and associated hospitalizations, children ages 13 and under may not visit patients in [facility] hospitals. If the patient needs to stay at the hospital during part of their recovery, the visitor guidelines for inpatient rooms apply.  PRE-OP VISITATION  Pre-op nurse will coordinate an appropriate time for 1 support person to accompany the patient in pre-op.  This support person may not rotate.  This visitor will be contacted when the time is appropriate for the visitor to come back in the pre-op area.  Please refer to the [facility] website for the visitor guidelines for Inpatients (after your surgery is over and you are in a regular room).  You are not required to quarantine at this time prior to your surgery. However, you must do this: Hand Hygiene often Do NOT share personal items Notify your provider if you are in close contact with someone who has COVID or you develop fever 100.4 or greater, new onset of sneezing, cough, sore throat, shortness of breath or body aches.  If you test positive for Covid or have been in contact with anyone that has tested positive in the last 10 days please notify you surgeon.    Your procedure is scheduled on: Monday  August 25, 2023    Report to [facility] Main Entrance: [address]sen entrance where the [employer] is available.   Report to admitting at: 05:15    AM  Call this number if you have any questions or problems the morning of surgery [phone]  DO NOT EAT OR DRINK ANYTHING AFTER MIDNIGHT THE NIGHT PRIOR TO YOUR SURGERY / PROCEDURE.   FOLLOW  ANY ADDITIONAL PRE OP INSTRUCTIONS YOU RECEIVED FROM YOUR SURGEON'S OFFICE!!!   Oral Hygiene is also important to reduce your risk of infection.         Remember - BRUSH YOUR TEETH THE MORNING OF SURGERY WITH YOUR REGULAR TOOTHPASTE  Do NOT smoke after Midnight the night before surgery.  STOP TAKING all Vitamins, Herbs and supplements 1 week before your surgery.   Take ONLY these medicines the morning of surgery with A SIP OF WATER: cetirizine (Zyrtec), Levothyroxine,   You may use your Astelin Nasal spray if needed.                     You may not have any metal on your body including  jewelry, and body piercing  Do not wear lotions, powders, cologne, or deodorant  Men may shave face and neck.  Contacts, Hearing Aids, dentures or bridgework may not be worn into surgery. DENTURES WILL BE REMOVED PRIOR TO SURGERY PLEASE DO NOT APPLY "Poly grip" OR ADHESIVES!!!  Patients discharged on the day of surgery will not be allowed to drive home.  Someone NEEDS to stay with you for the first 24 hours after anesthesia.  Do not bring your home medications to the hospital. The Pharmacy will dispense medications listed on your medication list to you during your admission in the Hospital.  Please read over the following fact sheets you were given: IF YOU HAVE QUESTIONS ABOUT YOUR PRE-OP INSTRUCTIONS, PLEASE CALL [phone].   [facility] - Preparing for Surgery Before surgery, you can play an important role.  Because skin is not sterile, your  skin needs to be as free of germs as possible.  You can reduce the number of germs on your skin by washing with CHG (chlorahexidine gluconate) soap before surgery.  CHG is an antiseptic cleaner which kills germs and bonds with the skin to continue killing germs even after washing. Please DO NOT use if you have an allergy to CHG or antibacterial soaps.  If your skin becomes reddened/irritated stop using the CHG and inform your nurse when you arrive at Short Stay. Do not shave (including legs and underarms) for at least 48 hours prior to the first CHG shower.  You may shave your face/neck.  Please follow these  instructions carefully:  1.  Shower with CHG Soap the night before surgery and the  morning of surgery.  2.  If you choose to wash your hair, wash your hair first as usual with your normal  shampoo.  3.  After you shampoo, rinse your hair and body thoroughly to remove the shampoo.                             4.  Use CHG as you would any other liquid soap.  You can apply chg directly to the skin and wash.  Gently with a scrungie or clean washcloth.  5.  Apply the CHG Soap to your body ONLY FROM THE NECK DOWN.   Do not use on face/ open                           Wound or open sores. Avoid contact with eyes, ears mouth and genitals (private parts).                       Wash face,  Genitals (private parts) with your normal soap.             6.  Wash thoroughly, paying special attention to the area where your  surgery  will be performed.  7.  Thoroughly rinse your body with warm water from the neck down.  8.  DO NOT shower/wash with your normal soap after using and rinsing off the CHG Soap.            9.  Pat yourself dry with a clean towel.            10.  Wear clean pajamas.            11.  Place clean sheets on your bed the night of your first shower and do not  sleep with pets.  ON THE DAY OF SURGERY : Do not apply any lotions/deodorants the morning of surgery.  Please wear clean clothes to the hospital/surgery center.    FAILURE TO FOLLOW THESE INSTRUCTIONS MAY RESULT IN THE CANCELLATION OF YOUR SURGERY  PATIENT SIGNATURE_________________________________  NURSE SIGNATURE__________________________________  ________________________________________________________________________

## 2023-08-12 ENCOUNTER — Other Ambulatory Visit: Payer: Self-pay

## 2023-08-12 ENCOUNTER — Ambulatory Visit: Payer: 59 | Attending: Cardiology | Admitting: Cardiology

## 2023-08-12 ENCOUNTER — Encounter (HOSPITAL_COMMUNITY): Payer: Self-pay

## 2023-08-12 ENCOUNTER — Encounter: Payer: Self-pay | Admitting: Cardiology

## 2023-08-12 ENCOUNTER — Encounter (HOSPITAL_COMMUNITY)
Admission: RE | Admit: 2023-08-12 | Discharge: 2023-08-12 | Disposition: A | Discharge status: Home / Self Care | Payer: 59 | Source: Ambulatory Visit | Attending: General Surgery | Admitting: General Surgery

## 2023-08-12 ENCOUNTER — Telehealth: Payer: Self-pay

## 2023-08-12 ENCOUNTER — Ambulatory Visit: Payer: 59 | Admitting: Cardiology

## 2023-08-12 VITALS — BP 124/76 | HR 63 | Ht 70.0 in | Wt 193.0 lb

## 2023-08-12 VITALS — BP 142/105 | HR 58 | Temp 98.4°F | Resp 16 | Ht 71.0 in | Wt 190.0 lb

## 2023-08-12 DIAGNOSIS — Z905 Acquired absence of kidney: Secondary | ICD-10-CM | POA: Diagnosis not present

## 2023-08-12 DIAGNOSIS — E782 Mixed hyperlipidemia: Secondary | ICD-10-CM

## 2023-08-12 DIAGNOSIS — Z01812 Encounter for preprocedural laboratory examination: Secondary | ICD-10-CM | POA: Insufficient documentation

## 2023-08-12 DIAGNOSIS — I6522 Occlusion and stenosis of left carotid artery: Secondary | ICD-10-CM

## 2023-08-12 DIAGNOSIS — Z01818 Encounter for other preprocedural examination: Secondary | ICD-10-CM

## 2023-08-12 DIAGNOSIS — I10 Essential (primary) hypertension: Secondary | ICD-10-CM

## 2023-08-12 DIAGNOSIS — Z01810 Encounter for preprocedural cardiovascular examination: Secondary | ICD-10-CM

## 2023-08-12 HISTORY — DX: Unspecified osteoarthritis, unspecified site: M19.90

## 2023-08-12 HISTORY — DX: Encounter for preprocedural cardiovascular examination: Z01.810

## 2023-08-12 LAB — CBC
HCT: 48.5 % (ref 39.0–52.0)
Hemoglobin: 16.1 g/dL (ref 13.0–17.0)
MCH: 30.9 pg (ref 26.0–34.0)
MCHC: 33.2 g/dL (ref 30.0–36.0)
MCV: 93.1 fL (ref 80.0–100.0)
Platelets: 316 10*3/uL (ref 150–400)
RBC: 5.21 MIL/uL (ref 4.22–5.81)
RDW: 12.6 % (ref 11.5–15.5)
WBC: 7.1 10*3/uL (ref 4.0–10.5)
nRBC: 0 % (ref 0.0–0.2)

## 2023-08-12 LAB — BASIC METABOLIC PANEL
Anion gap: 8 (ref 5–15)
BUN: 17 mg/dL (ref 6–20)
CO2: 26 mmol/L (ref 22–32)
Calcium: 9.2 mg/dL (ref 8.9–10.3)
Chloride: 102 mmol/L (ref 98–111)
Creatinine, Ser: 1.26 mg/dL — ABNORMAL HIGH (ref 0.61–1.24)
GFR, Estimated: >60 mL/min (ref >60)
Glucose, Bld: 99 mg/dL (ref 70–99)
Potassium: 4.2 mmol/L (ref 3.5–5.1)
Sodium: 136 mmol/L (ref 135–145)

## 2023-08-12 NOTE — Telephone Encounter (Signed)
Pt called regarding holding his AC's prior to hernia repair later this month. I spoke with Joni Reining at Dr. Mylinda Latina office who said they received cardiology medication hold and did not need anything from [place]. Pt is aware.

## 2023-08-12 NOTE — Progress Notes (Signed)
Cardiology Office Note:    Date:  08/12/2023   ID:  Bobby Gonzalez, DOB 1966-03-20, MRN [id number]  PCP:  Zola Button, Grayling Congress, DO  Cardiologist:  Garwin Brothers, MD   Referring MD: Zola Button, Grayling Congress, *       ASSESSMENT:    1. Preop cardiovascular exam   2. Asymptomatic carotid artery stenosis, left   3. Essential hypertension   4. Mixed hyperlipidemia   5. Solitary kidney, acquired    PLAN:    In order of problems listed above:  Atherosclerotic vascular disease: Secondary prevention stressed with the patient.  Importance of compliance with diet medication stressed and vocalized understanding.  He is an active gentleman with good effort tolerance. Preoperative cardiovascular risk assessment: Patient has good effort tolerance.  He is planning to undergo hernia surgery.  In view of the above evaluation he is not at high risk for coronary events during the aforementioned surgery.  Medical hemodynamic monitoring will further reduce the risk of coronary events.  He is dual antiplatelet therapy issue has to be handled by his vascular surgeon and he is going to give them a call. Mixed dyslipidemia: On lipid-lowering medications lipids reviewed from [facility] sheet and found to be fine. Patient will be seen in follow-up appointment in 6 months or earlier if the patient has any concerns.    Medication Adjustments/Labs and Tests Ordered: Current medicines are reviewed at length with the patient today.  Concerns regarding medicines are outlined above.  No orders of the defined types were placed in this encounter.  No orders of the defined types were placed in this encounter.    No chief complaint on file.    History of Present Illness:    Bobby Gonzalez is a 56 y.o. male.  Patient has past medical history of carotid stenting on the left side for stenosis, mixed dyslipidemia and essential hypertension.  He denies any problems at this time and takes care of activities of daily  living.  He does yard work and has been active all the season without any symptoms.  No chest pain orthopnea or PND.  At the time of my evaluation, the patient is alert awake oriented and in no distress.  He is planning to undergo hernia surgery. 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 radiation and chemo 05-13-12(Dr. Gaylyn Rong)   Cancer of other and ill-defined sites within the digestive organs and peritoneum (HCC) 01/18/2022   Cardiac murmur 09/29/2020   Carotid atherosclerosis 03/01/2021   Carotid stenosis 09/05/2022   Chronic kidney disease    right renal mass 7.3x6.7x6.8cm renal cell ca   Dehydration 05/13/2012   Difficult intubation    POSSIBLE DIFFICULT INTUBATION--S/P RADIATION FOR CANCER LEFT TONSIL--EATING BUT THROAT STILL SORE AND PT HAS THICK MUCUS   Diverticula of colon 03/10/2012   multiple rectosigmoid colonic diverticula/ ct abd/pelvis   Dizziness 11/13/2020   Dry mouth 05/16/2014   Essential hypertension 10/11/2019   Gastro-esophageal reflux disease without esophagitis    GERD (gastroesophageal reflux disease)    Hearing loss of right ear 05/29/2020   History of cancer tonsil 09/20/2016   History of head and neck radiation 09/20/2016   History of radiation therapy 03/30/12-05/15/12  left tonsil   HTN (hypertension) 06/25/2012   at first start of chemo for squamous cell cancer of neck-left   Hyperlipidemia 05/21/2021   Hypothyroid 02/15/2013   Inguinal hernia 10/23/2012   Inguinal hernia    right   Malignant tumor of kidney (HCC) 09/27/2020   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   Multiple lung nodules on CT 05/16/2014   Other fatigue 03/04/2022   Pain    JOINT PAINS UPPER BODY AFTER RADIATION TREATMENTS    Personal history of malignant neoplasm of unspecified site of lip, oral cavity, and pharynx 09/27/2020   Personal history of other malignant neoplasm of kidney 09/27/2020   Phlegm in throat 05/13/2012   Pneumonia    Preventative health care 05/16/2014   Preventive measure 05/16/2014   Renal cell carcinoma (HCC) 09/09/2012   Renal mass 03/30/2012   Sensorineural hearing loss, bilateral 09/20/2016   Solitary kidney, acquired 09/29/2020   Solitary pulmonary nodule 09/27/2020   Syncope 09/29/2020   Tinnitus aurium, right 05/29/2020   Tonsil cancer (HCC) 02/2012   SCCa of Left tonsil-S/P biopsy on 02/17/12.   Viral upper respiratory tract infection 03/04/2022    Past Surgical History:  Procedure Laterality Date   DIRECT LARYNGOSCOPY     S/P DL, Biopsy-Dr. Pollyann Kennedy. Pathology postive for SCCa of Left Tonsil   GASTROSTOMY TUBE PLACEMENT  03/02/12   INGUINAL HERNIA REPAIR Left 11/04/2012   Procedure: HERNIA REPAIR INGUINAL ADULT;  Surgeon: Clovis Pu. Cornett, MD;  Location: [facility];  Service: General;  Laterality: Left;   INSERTION OF MESH Left 11/04/2012   Procedure: INSERTION OF MESH;  Surgeon: Clovis Pu. Cornett, MD;  Location: [facility];  Service: General;  Laterality: Left;   LAPAROSCOPIC NEPHRECTOMY  06/29/2012   Procedure: LAPAROSCOPIC NEPHRECTOMY;  Surgeon: Crecencio Mc, MD;  Location: [facility];  Service: Urology;  Laterality: Right;   MULTIPLE TOOTH EXTRACTIONS  03/04/12   with DR. Mohorn   PEG TUBE PLACEMENT  06-25-12   remains in place abdomen-not using at present.   PEG TUBE REMOVAL  10/2012   tonsil biopsy  02/17/12   SCCa left tomnsil,HPV positive,spread to l cervical node   TRANSCAROTID ARTERY REVASCULARIZATION  Left 09/05/2022   Procedure: Left Transcarotid Artery Revascularization;  Surgeon: Maeola Harman, MD;  Location: [facility] OR;  Service: Vascular;  Laterality: Left;   ULTRASOUND GUIDANCE FOR VASCULAR ACCESS Right 09/05/2022   Procedure: ULTRASOUND GUIDANCE FOR VASCULAR ACCESS RIGHT  FEMORAL ARTERY;  Surgeon: Maeola Harman, MD;  Location: [facility] OR;  Service: Vascular;  Laterality: Right;   WISDOM TOOTH EXTRACTION      Current Medications: Current Meds  Medication Sig   aspirin EC 81 MG tablet Take 81 mg by mouth daily.   azelastine (ASTELIN) 0.1 % nasal spray Place 1 spray into both nostrils 2 (two) times daily as directed   cetirizine (ZYRTEC) 10 MG tablet Take 10 mg by mouth daily.   clopidogrel (PLAVIX) 75 MG tablet Take 1 tablet (75 mg total) by mouth daily.   doxycycline (VIBRA-TABS) 100 MG tablet Take 1 tablet (100 mg total) by mouth 2 (two) times daily.   levothyroxine (SYNTHROID) 88 MCG tablet Take 1 tablet (88 mcg total) by mouth daily before breakfast.   Multiple Vitamin (MULTIVITAMIN) tablet Take 1 tablet by mouth daily.   pilocarpine (SALAGEN) 5 MG tablet Take 1 tablet (5 mg total) by mouth 3 (three) times daily.   predniSONE (DELTASONE) 10 MG tablet Take 3 tablets (30 mg total) by mouth daily  for 3 days, THEN 2 tablets (20 mg total) daily for 3 days, THEN 1 tablet (10 mg total) daily for 3 days, THEN 0.5 tablets (5 mg total) daily for 3 days.   rosuvastatin (CRESTOR) 10 MG tablet Take 1 tablet (10 mg total) by mouth daily.   valsartan (DIOVAN) 80 MG tablet Take 1 tablet (80 mg total) by mouth daily.     Allergies:   Banana and Penicillins   Social History   Socioeconomic History   Marital status: Married    Spouse name: Not on file   Number of children: 2   Years of education: Not on file   Highest education level: Associate degree: academic program  Occupational History    Employer: [employer]    Comment: Research scientist (medical)  Tobacco Use   Smoking status: Never   Smokeless tobacco: Never  Vaping Use   Vaping status: Never Used  Substance and Sexual Activity   Alcohol use: No    Comment: Never drank alcohol   Drug use: No   Sexual activity: Yes  Other Topics Concern   Not on file  Social History Narrative   The patient is  married and has 2 daughters.Patient has never been a smoker.Patient denies use of smokeless tobacco.Patient has never drank alcohol.   Right handed   Caffeine: no coffee or soda. Tea in the weekend, drinks mainly flavored water due to only having 1 kidney   Social Determinants of Health   Financial Resource Strain: Not on file  Food Insecurity: Not on file  Transportation Needs: Not on file  Physical Activity: Not on file  Stress: Not on file  Social Connections: Not on file     Family History: The patient's family history includes Cancer in his father; Hypertension in his brother and father; Stroke in his father; Throat cancer in his brother; Thyroid disease in his brother. There is no history of Colon cancer, Esophageal cancer, Rectal cancer, or Stomach cancer.  ROS:   Please see the history of present illness.    All other systems reviewed and are negative.  EKGs/Labs/Other Studies Reviewed:    The following studies were reviewed today: I discussed my findings with the patient at length   Recent Labs: 06/12/2023: ALT 20; TSH 2.94 08/12/2023: BUN 17; Creatinine, Ser 1.26; Hemoglobin 16.1; Platelets 316; Potassium 4.2; Sodium 136  Recent Lipid Panel    Component Value Date/Time   CHOL 103 06/12/2023 0932   CHOL 105 10/04/2021 0844   TRIG 76.0 06/12/2023 0932   HDL 45.10 06/12/2023 0932   HDL 48 10/04/2021 0844   CHOLHDL 2 06/12/2023 0932   VLDL 15.2 06/12/2023 0932   LDLCALC 43 06/12/2023 0932   LDLCALC 45 10/04/2021 0844   LDLCALC 61 05/15/2020 0929    Physical Exam:    VS:  BP 124/76   Pulse 63   Ht 5\' 10"  (1.778 m)   Wt 193 lb 0.6 oz (87.6 kg)   SpO2 97%   BMI 27.70 kg/m     Wt Readings from Last 3 Encounters:  08/12/23 193 lb 0.6 oz (87.6 kg)  08/12/23 190 lb (86.2 kg)  08/04/23 197 lb (89.4 kg)     GEN: Patient is in no acute distress HEENT: Normal NECK: No JVD; No carotid bruits LYMPHATICS: No lymphadenopathy CARDIAC: Hear sounds regular, 2/6  systolic murmur at the apex. RESPIRATORY:  Clear to auscultation without rales, wheezing or rhonchi  ABDOMEN: Soft, non-tender, non-distended MUSCULOSKELETAL:  No edema; No deformity  SKIN: Warm and dry NEUROLOGIC:  Alert and oriented x 3 PSYCHIATRIC:  Normal affect   Signed, Garwin Brothers, MD  08/12/2023 3:51 PM     [facility]

## 2023-08-12 NOTE — Patient Instructions (Signed)

## 2023-08-16 ENCOUNTER — Other Ambulatory Visit (HOSPITAL_COMMUNITY): Payer: Self-pay

## 2023-08-22 ENCOUNTER — Ambulatory Visit: Payer: Self-pay | Admitting: General Surgery

## 2023-08-22 ENCOUNTER — Encounter (HOSPITAL_COMMUNITY): Payer: Self-pay | Admitting: General Surgery

## 2023-08-22 NOTE — Anesthesia Preprocedure Evaluation (Signed)
Anesthesia Evaluation  Patient identified by MRN, date of birth, ID band Patient awake    Reviewed: Allergy & Precautions, NPO status , Patient's Chart, lab work & pertinent test results  History of Anesthesia Complications (+) DIFFICULT AIRWAY and history of anesthetic complications  Airway Mallampati: II  TM Distance: >3 FB Neck ROM: Limited    Dental  (+) Missing, Dental Advisory Given   Pulmonary pneumonia, resolved Pulmonary nodules   Pulmonary exam normal breath sounds clear to auscultation       Cardiovascular hypertension, Pt. on medications + Peripheral Vascular Disease  Normal cardiovascular exam+ Valvular Problems/Murmurs  Rhythm:Regular Rate:Normal  S/P left carotid revascularization with stent placement   Neuro/Psych negative neurological ROS  negative psych ROS   GI/Hepatic ,GERD  ,,Hx/o tonsillar Ca with metastasis S/P tonsillectomy ChemoRx and RT Hx/o multiple teeth extractions   Endo/Other  Hypothyroidism  Hyperlipidemi  Renal/GU Renal diseaseHx/o right renal cell Ca - S/P nephrectomy Metastasis to peritoneum  negative genitourinary   Musculoskeletal  (+) Arthritis , Osteoarthritis,  Right Inguinal hernia   Abdominal   Peds  Hematology Plavix therapy- last dose   Anesthesia Other Findings   Reproductive/Obstetrics                              Anesthesia Physical Anesthesia Plan  ASA: 3  Anesthesia Plan: General   Post-op Pain Management: Regional block* and Minimal or no pain anticipated   Induction: Intravenous  PONV Risk Score and Plan: 4 or greater and Treatment may vary due to age or medical condition, Midazolam, Ondansetron and Dexamethasone  Airway Management Planned: Oral ETT and Video Laryngoscope Planned  Additional Equipment: None  Intra-op Plan:   Post-operative Plan: Extubation in OR  Informed Consent: I have reviewed the patients History and  Physical, chart, labs and discussed the procedure including the risks, benefits and alternatives for the proposed anesthesia with the patient or authorized representative who has indicated his/her understanding and acceptance.     Dental advisory given  Plan Discussed with: CRNA and Anesthesiologist  Anesthesia Plan Comments:          Anesthesia Quick Evaluation

## 2023-08-22 NOTE — Progress Notes (Signed)
Anesthesia Chart Review   Case: [id number] Date/Time: 08/25/23 0705   Procedure: OPEN RIGHT INGUINAL HERNIA REPAIR WITH MESH (Right) - GEN OR LMA   Anesthesia type: Choice   Pre-op diagnosis: RIGHT INGUINAL HERNIA   [facility]   Surgeons: Kinsinger, De Blanch, MD       DISCUSSION:57 y.o. with h/o HTN, CKD, s/p left carotid stent 09/05/2022, right inguinal hernia scheduled for above procedure 08/25/2023 with Dr. Feliciana Rossetti.   Pt last seen by cardiology 08/12/2023. Per OV note, "Preoperative cardiovascular risk assessment: Patient has good effort tolerance. He is planning to undergo hernia surgery. In view of the above evaluation he is not at high risk for coronary events during the aforementioned surgery. Medical hemodynamic monitoring will further reduce the risk of coronary events. He is dual antiplatelet therapy issue has to be handled by his vascular surgeon and he is going to give them a call. "  Difficult intubation- Tonsillar cancer / radiation and surgery (had glidescope on 09-05-22 TCAR, no complications noted).  VS: BP (!) 142/105 Comment: right arm sitting  Pulse (!) 58   Temp 36.9 C (Oral)   Resp 16   Ht 5\' 11"  (1.803 m)   Wt 86.2 kg   SpO2 97%   BMI 26.50 kg/m   PROVIDERS: Donato Schultz, DO is PCP    LABS: Labs reviewed: Acceptable for surgery. (all labs ordered are listed, but only abnormal results are displayed)  Labs Reviewed  BASIC METABOLIC PANEL - Abnormal; Notable for the following components:      Result Value   Creatinine, Ser 1.26 (*)    All other components within normal limits  CBC     IMAGES:   EKG:   CV: Echo 10/30/2020  1. Left ventricular ejection fraction, by estimation, is 60 to 65%. The  left ventricle has normal function. The left ventricle has no regional  wall motion abnormalities. There is moderate concentric left ventricular  hypertrophy. Left ventricular  diastolic parameters are consistent with  Grade II diastolic dysfunction  (pseudonormalization). Elevated left atrial pressure.   2. Right ventricular systolic function is normal. The right ventricular  size is normal. There is normal pulmonary artery systolic pressure.   3. The mitral valve is normal in structure. No evidence of mitral valve  regurgitation. No evidence of mitral stenosis.   4. The aortic valve is tricuspid. Aortic valve regurgitation is not  visualized. No aortic stenosis is present.   5. The inferior vena cava is normal in size with greater than 50%  respiratory variability, suggesting right atrial pressure of 3 mmHg.  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 radiation and chemo 05-13-12(Dr. Gaylyn Rong)   Cancer of other and ill-defined sites within the digestive organs and peritoneum (HCC) 01/18/2022   Cardiac murmur 09/29/2020   Carotid atherosclerosis 03/01/2021   Carotid stenosis 09/05/2022   Chronic kidney disease    right renal mass 7.3x6.7x6.8cm renal cell ca   Dehydration 05/13/2012   Difficult intubation    POSSIBLE DIFFICULT INTUBATION--S/P RADIATION FOR CANCER LEFT TONSIL--EATING BUT THROAT STILL SORE AND PT HAS THICK MUCUS   Diverticula of colon 03/10/2012   multiple rectosigmoid colonic diverticula/ ct  abd/pelvis   Dizziness 11/13/2020   Dry mouth 05/16/2014   Essential hypertension 10/11/2019   Gastro-esophageal reflux disease without esophagitis    GERD (gastroesophageal reflux disease)    Hearing loss of right ear 05/29/2020   History of cancer tonsil 09/20/2016   History of head and neck radiation 09/20/2016   History of radiation therapy 03/30/12-05/15/12   left tonsil   HTN (hypertension) 06/25/2012   at first start of chemo for squamous  cell cancer of neck-left   Hyperlipidemia 05/21/2021   Hypothyroid 02/15/2013   Inguinal hernia 10/23/2012   Inguinal hernia    right   Malignant tumor of kidney (HCC) 09/27/2020   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   Multiple lung nodules on CT 05/16/2014   Other fatigue 03/04/2022   Pain    JOINT PAINS UPPER BODY AFTER RADIATION TREATMENTS   Personal history of malignant neoplasm of unspecified site of lip, oral cavity, and pharynx 09/27/2020   Personal history of other malignant neoplasm of kidney 09/27/2020   Phlegm in throat 05/13/2012   Pneumonia    Preventative health care 05/16/2014   Preventive measure 05/16/2014   Renal cell carcinoma (HCC) 09/09/2012   Renal mass 03/30/2012   Sensorineural hearing loss, bilateral 09/20/2016   Solitary kidney, acquired 09/29/2020   Solitary pulmonary nodule 09/27/2020   Syncope 09/29/2020   Tinnitus aurium, right 05/29/2020   Tonsil cancer (HCC) 02/2012   SCCa of Left tonsil-S/P biopsy on 02/17/12.   Viral upper respiratory tract infection 03/04/2022    Past Surgical History:  Procedure Laterality Date   DIRECT LARYNGOSCOPY     S/P DL, Biopsy-Dr. Pollyann Kennedy. Pathology postive for SCCa of Left Tonsil   GASTROSTOMY TUBE PLACEMENT  03/02/12   INGUINAL HERNIA REPAIR Left 11/04/2012   Procedure: HERNIA REPAIR INGUINAL ADULT;  Surgeon: Clovis Pu. Cornett, MD;  Location: [facility];  Service: General;  Laterality: Left;   INSERTION OF MESH Left 11/04/2012   Procedure: INSERTION OF MESH;  Surgeon: Clovis Pu. Cornett, MD;  Location: [facility];  Service: General;  Laterality: Left;   LAPAROSCOPIC NEPHRECTOMY  06/29/2012   Procedure: LAPAROSCOPIC NEPHRECTOMY;  Surgeon: Crecencio Mc, MD;  Location: [facility];  Service: Urology;  Laterality: Right;   MULTIPLE TOOTH EXTRACTIONS  03/04/12   with DR. Mohorn   PEG TUBE PLACEMENT  06-25-12   remains in place abdomen-not using at present.   PEG TUBE REMOVAL  10/2012   tonsil biopsy  02/17/12   SCCa left tomnsil,HPV  positive,spread to l cervical node   TRANSCAROTID ARTERY REVASCULARIZATION  Left 09/05/2022   Procedure: Left Transcarotid Artery Revascularization;  Surgeon: Maeola Harman, MD;  Location: [facility] OR;  Service: Vascular;  Laterality: Left;   ULTRASOUND GUIDANCE FOR VASCULAR ACCESS Right 09/05/2022   Procedure: ULTRASOUND GUIDANCE FOR VASCULAR ACCESS RIGHT FEMORAL ARTERY;  Surgeon: Maeola Harman, MD;  Location: [facility] OR;  Service: Vascular;  Laterality: Right;   WISDOM TOOTH EXTRACTION      MEDICATIONS:  aspirin EC 81 MG tablet   azelastine (ASTELIN) 0.1 % nasal spray   cetirizine (ZYRTEC) 10 MG tablet   clopidogrel (PLAVIX) 75 MG tablet   doxycycline (VIBRA-TABS) 100 MG tablet   levothyroxine (SYNTHROID) 88 MCG tablet   Multiple Vitamin (MULTIVITAMIN) tablet   pilocarpine (SALAGEN) 5 MG tablet   rosuvastatin (CRESTOR) 10 MG tablet   valsartan (DIOVAN) 80 MG tablet   No current facility-administered medications for this encounter.     Jodell Cipro Ward, PA-C [facility] (  [phone]      

## 2023-08-25 ENCOUNTER — Ambulatory Visit (HOSPITAL_BASED_OUTPATIENT_CLINIC_OR_DEPARTMENT_OTHER): Payer: 59 | Admitting: Certified Registered"

## 2023-08-25 ENCOUNTER — Encounter (HOSPITAL_COMMUNITY)
Admission: RE | Disposition: A | Discharge status: Home / Self Care | Payer: Self-pay | Source: Home / Self Care | Attending: General Surgery

## 2023-08-25 ENCOUNTER — Other Ambulatory Visit: Payer: Self-pay

## 2023-08-25 ENCOUNTER — Encounter (HOSPITAL_COMMUNITY): Payer: Self-pay | Admitting: General Surgery

## 2023-08-25 ENCOUNTER — Ambulatory Visit (HOSPITAL_COMMUNITY)
Admission: RE | Admit: 2023-08-25 | Discharge: 2023-08-25 | Disposition: A | Discharge status: Home / Self Care | Payer: 59 | Attending: General Surgery | Admitting: General Surgery

## 2023-08-25 ENCOUNTER — Ambulatory Visit (HOSPITAL_COMMUNITY): Payer: Self-pay | Admitting: Certified Registered"

## 2023-08-25 ENCOUNTER — Other Ambulatory Visit (HOSPITAL_COMMUNITY): Payer: Self-pay

## 2023-08-25 DIAGNOSIS — E039 Hypothyroidism, unspecified: Secondary | ICD-10-CM

## 2023-08-25 DIAGNOSIS — G8918 Other acute postprocedural pain: Secondary | ICD-10-CM | POA: Diagnosis not present

## 2023-08-25 DIAGNOSIS — K4090 Unilateral inguinal hernia, without obstruction or gangrene, not specified as recurrent: Secondary | ICD-10-CM | POA: Diagnosis not present

## 2023-08-25 DIAGNOSIS — Z905 Acquired absence of kidney: Secondary | ICD-10-CM | POA: Insufficient documentation

## 2023-08-25 DIAGNOSIS — I6522 Occlusion and stenosis of left carotid artery: Secondary | ICD-10-CM | POA: Insufficient documentation

## 2023-08-25 DIAGNOSIS — I739 Peripheral vascular disease, unspecified: Secondary | ICD-10-CM | POA: Insufficient documentation

## 2023-08-25 DIAGNOSIS — Z85528 Personal history of other malignant neoplasm of kidney: Secondary | ICD-10-CM | POA: Diagnosis not present

## 2023-08-25 DIAGNOSIS — I129 Hypertensive chronic kidney disease with stage 1 through stage 4 chronic kidney disease, or unspecified chronic kidney disease: Secondary | ICD-10-CM

## 2023-08-25 DIAGNOSIS — N189 Chronic kidney disease, unspecified: Secondary | ICD-10-CM

## 2023-08-25 DIAGNOSIS — I10 Essential (primary) hypertension: Secondary | ICD-10-CM | POA: Diagnosis not present

## 2023-08-25 HISTORY — PX: INGUINAL HERNIA REPAIR: SHX194

## 2023-08-25 SURGERY — REPAIR, HERNIA, INGUINAL, ADULT
Anesthesia: General | Laterality: Right

## 2023-08-25 MED ORDER — MIDAZOLAM HCL 2 MG/2ML IJ SOLN
INTRAMUSCULAR | Status: DC | PRN
  Administered 2023-08-25: 2 mg via INTRAVENOUS

## 2023-08-25 MED ORDER — DEXAMETHASONE SODIUM PHOSPHATE 10 MG/ML IJ SOLN
INTRAMUSCULAR | Status: AC
  Filled 2023-08-25: qty 1

## 2023-08-25 MED ORDER — BUPIVACAINE LIPOSOME 1.3 % IJ SUSP
INTRAMUSCULAR | Status: AC
  Filled 2023-08-25: qty 20

## 2023-08-25 MED ORDER — EPHEDRINE 5 MG/ML INJ
INTRAVENOUS | Status: AC
  Filled 2023-08-25: qty 5

## 2023-08-25 MED ORDER — OXYCODONE HCL 5 MG/5ML PO SOLN: 5.0000 mg | Freq: Once | ORAL | Status: AC | PRN

## 2023-08-25 MED ORDER — ROCURONIUM BROMIDE 10 MG/ML (PF) SYRINGE
PREFILLED_SYRINGE | INTRAVENOUS | Status: AC
  Filled 2023-08-25: qty 10

## 2023-08-25 MED ORDER — HYDROMORPHONE HCL 1 MG/ML IJ SOLN
0.2500 mg | INTRAMUSCULAR | Status: DC | PRN
Start: 2023-08-25 — End: 2023-08-25
  Administered 2023-08-25: 0.5 mg via INTRAVENOUS
  Administered 2023-08-25: 0.25 mg via INTRAVENOUS

## 2023-08-25 MED ORDER — ONDANSETRON HCL 4 MG/2ML IJ SOLN: 4.0000 mg | Freq: Once | INTRAMUSCULAR | Status: DC | PRN

## 2023-08-25 MED ORDER — OXYCODONE HCL 5 MG PO TABS
5.0000 mg | ORAL_TABLET | Freq: Once | ORAL | Status: AC | PRN
  Administered 2023-08-25: 5 mg via ORAL

## 2023-08-25 MED ORDER — BUPIVACAINE HCL (PF) 0.5 % IJ SOLN
INTRAMUSCULAR | Status: AC
  Filled 2023-08-25: qty 30

## 2023-08-25 MED ORDER — PROPOFOL 10 MG/ML IV BOLUS
INTRAVENOUS | Status: DC | PRN
  Administered 2023-08-25: 140 mg via INTRAVENOUS

## 2023-08-25 MED ORDER — CHLORHEXIDINE GLUCONATE 0.12 % MT SOLN
15.0000 mL | Freq: Once | OROMUCOSAL | Status: AC
  Administered 2023-08-25: 15 mL via OROMUCOSAL

## 2023-08-25 MED ORDER — PROPOFOL 10 MG/ML IV BOLUS
INTRAVENOUS | Status: AC
  Filled 2023-08-25: qty 20

## 2023-08-25 MED ORDER — OXYCODONE HCL 5 MG PO TABS
5.0000 mg | ORAL_TABLET | Freq: Four times a day (QID) | ORAL | 0 refills | Status: AC | PRN
  Filled 2023-08-25: qty 15, 4d supply, fill #0

## 2023-08-25 MED ORDER — BUPIVACAINE LIPOSOME 1.3 % IJ SUSP
INTRAMUSCULAR | Status: DC | PRN
  Administered 2023-08-25: 10 mL via PERINEURAL

## 2023-08-25 MED ORDER — DROPERIDOL 2.5 MG/ML IJ SOLN: 0.6250 mg | Freq: Once | INTRAMUSCULAR | Status: DC | PRN

## 2023-08-25 MED ORDER — CHLORHEXIDINE GLUCONATE CLOTH 2 % EX PADS: 6.0000 | MEDICATED_PAD | Freq: Once | CUTANEOUS | Status: DC

## 2023-08-25 MED ORDER — ORAL CARE MOUTH RINSE
15.0000 mL | Freq: Once | OROMUCOSAL | Status: AC
Start: 2023-08-25 — End: 2023-08-25

## 2023-08-25 MED ORDER — BUPIVACAINE HCL (PF) 0.5 % IJ SOLN
INTRAMUSCULAR | Status: DC | PRN
  Administered 2023-08-25: 20 mL via PERINEURAL

## 2023-08-25 MED ORDER — EPHEDRINE SULFATE-NACL 50-0.9 MG/10ML-% IV SOSY
PREFILLED_SYRINGE | INTRAVENOUS | Status: DC | PRN
  Administered 2023-08-25: 5 mg via INTRAVENOUS

## 2023-08-25 MED ORDER — OXYCODONE HCL 5 MG PO TABS
ORAL_TABLET | ORAL | Status: AC
  Filled 2023-08-25: qty 1

## 2023-08-25 MED ORDER — ROCURONIUM BROMIDE 10 MG/ML (PF) SYRINGE
PREFILLED_SYRINGE | INTRAVENOUS | Status: DC | PRN
  Administered 2023-08-25: 10 mg via INTRAVENOUS
  Administered 2023-08-25: 60 mg via INTRAVENOUS

## 2023-08-25 MED ORDER — LIDOCAINE HCL (PF) 2 % IJ SOLN
INTRAMUSCULAR | Status: AC
  Filled 2023-08-25: qty 5

## 2023-08-25 MED ORDER — ONDANSETRON HCL 4 MG/2ML IJ SOLN
INTRAMUSCULAR | Status: DC | PRN
  Administered 2023-08-25: 4 mg via INTRAVENOUS

## 2023-08-25 MED ORDER — ONDANSETRON HCL 4 MG/2ML IJ SOLN
INTRAMUSCULAR | Status: AC
  Filled 2023-08-25: qty 2

## 2023-08-25 MED ORDER — FENTANYL CITRATE (PF) 100 MCG/2ML IJ SOLN
INTRAMUSCULAR | Status: AC
  Filled 2023-08-25: qty 2

## 2023-08-25 MED ORDER — SUGAMMADEX SODIUM 200 MG/2ML IV SOLN
INTRAVENOUS | Status: DC | PRN
  Administered 2023-08-25: 200 mg via INTRAVENOUS

## 2023-08-25 MED ORDER — IBUPROFEN 800 MG PO TABS
800.0000 mg | ORAL_TABLET | Freq: Three times a day (TID) | ORAL | 0 refills | Status: DC | PRN
  Filled 2023-08-25: qty 15, 5d supply, fill #0

## 2023-08-25 MED ORDER — LACTATED RINGERS IV SOLN: INTRAVENOUS | Status: DC

## 2023-08-25 MED ORDER — CELECOXIB 200 MG PO CAPS
400.0000 mg | ORAL_CAPSULE | ORAL | Status: AC
  Administered 2023-08-25: 400 mg via ORAL
  Filled 2023-08-25: qty 2

## 2023-08-25 MED ORDER — DEXAMETHASONE SODIUM PHOSPHATE 10 MG/ML IJ SOLN
INTRAMUSCULAR | Status: DC | PRN
  Administered 2023-08-25: 4 mg via INTRAVENOUS

## 2023-08-25 MED ORDER — HYDROMORPHONE HCL 1 MG/ML IJ SOLN
INTRAMUSCULAR | Status: AC
  Administered 2023-08-25: 0.25 mg via INTRAVENOUS
  Filled 2023-08-25: qty 1

## 2023-08-25 MED ORDER — MIDAZOLAM HCL 2 MG/2ML IJ SOLN
INTRAMUSCULAR | Status: AC
  Filled 2023-08-25: qty 2

## 2023-08-25 MED ORDER — LIDOCAINE 2% (20 MG/ML) 5 ML SYRINGE
INTRAMUSCULAR | Status: DC | PRN
  Administered 2023-08-25: 40 mg via INTRAVENOUS

## 2023-08-25 MED ORDER — ACETAMINOPHEN 500 MG PO TABS
1000.0000 mg | ORAL_TABLET | ORAL | Status: AC
  Administered 2023-08-25: 1000 mg via ORAL
  Filled 2023-08-25: qty 2

## 2023-08-25 MED ORDER — CEFAZOLIN SODIUM-DEXTROSE 2-4 GM/100ML-% IV SOLN
2.0000 g | INTRAVENOUS | Status: AC
  Administered 2023-08-25: 2 g via INTRAVENOUS
  Filled 2023-08-25: qty 100

## 2023-08-25 MED ORDER — FENTANYL CITRATE (PF) 100 MCG/2ML IJ SOLN
INTRAMUSCULAR | Status: DC | PRN
  Administered 2023-08-25 (×2): 50 ug via INTRAVENOUS

## 2023-08-25 SURGICAL SUPPLY — 37 items
BAG COUNTER SPONGE SURGICOUNT (BAG) ×1 IMPLANT
BENZOIN TINCTURE PRP APPL 2/3 (GAUZE/BANDAGES/DRESSINGS) IMPLANT
BLADE SURG 15 STRL LF DISP TIS (BLADE) ×1 IMPLANT
CHLORAPREP W/TINT 26 (MISCELLANEOUS) ×1 IMPLANT
COVER SURGICAL LIGHT HANDLE (MISCELLANEOUS) ×1 IMPLANT
DERMABOND ADVANCED .7 DNX12 (GAUZE/BANDAGES/DRESSINGS) ×1 IMPLANT
DRAIN PENROSE 0.5X18 (DRAIN) IMPLANT
DRAPE LAPAROTOMY TRNSV 102X78 (DRAPES) ×1 IMPLANT
DRAPE UTILITY XL STRL (DRAPES) ×1 IMPLANT
DRSG TELFA PLUS 4X6 ADH ISLAND (GAUZE/BANDAGES/DRESSINGS) IMPLANT
ELECT REM PT RETURN 15FT ADLT (MISCELLANEOUS) ×1 IMPLANT
GAUZE SPONGE 4X4 12PLY STRL (GAUZE/BANDAGES/DRESSINGS) IMPLANT
GLOVE BIOGEL PI IND STRL 7.0 (GLOVE) IMPLANT
GLOVE SURG SS PI 7.0 STRL IVOR (GLOVE) ×1 IMPLANT
GOWN STRL REUS W/ TWL LRG LVL3 (GOWN DISPOSABLE) ×1 IMPLANT
GOWN STRL REUS W/ TWL XL LVL3 (GOWN DISPOSABLE) IMPLANT
KIT BASIN OR (CUSTOM PROCEDURE TRAY) ×1 IMPLANT
KIT TURNOVER KIT A (KITS) IMPLANT
MARKER SKIN DUAL TIP RULER LAB (MISCELLANEOUS) ×1 IMPLANT
MESH HERNIA 3X6 (Mesh General) IMPLANT
NDL HYPO 22X1.5 SAFETY MO (MISCELLANEOUS) ×1 IMPLANT
NEEDLE HYPO 22X1.5 SAFETY MO (MISCELLANEOUS) ×1
PACK BASIC VI WITH GOWN DISP (CUSTOM PROCEDURE TRAY) ×1 IMPLANT
PENCIL SMOKE EVACUATOR (MISCELLANEOUS) ×1 IMPLANT
SPIKE FLUID TRANSFER (MISCELLANEOUS) ×1 IMPLANT
SPONGE T-LAP 4X18 ~~LOC~~+RFID (SPONGE) ×1 IMPLANT
STRIP CLOSURE SKIN 1/2X4 (GAUZE/BANDAGES/DRESSINGS) IMPLANT
SUT MNCRL AB 4-0 PS2 18 (SUTURE) ×1 IMPLANT
SUT PDS AB 2-0 CT2 27 (SUTURE) ×1 IMPLANT
SUT PROLENE 2 0 CT2 30 (SUTURE) ×2 IMPLANT
SUT VIC AB 3-0 SH 18 (SUTURE) ×1 IMPLANT
SUT VIC AB 3-0 SH 27XBRD (SUTURE) IMPLANT
SUT VIC AB 3-0 SH 8-18 (SUTURE) IMPLANT
SYR BULB IRRIG 60ML STRL (SYRINGE) ×1 IMPLANT
SYR CONTROL 10ML LL (SYRINGE) ×1 IMPLANT
TOWEL OR 17X26 10 PK STRL BLUE (TOWEL DISPOSABLE) ×1 IMPLANT
YANKAUER SUCT BULB TIP 10FT TU (MISCELLANEOUS) IMPLANT

## 2023-08-25 NOTE — Anesthesia Procedure Notes (Addendum)
Procedure Name: Intubation Date/Time: 08/25/2023 7:45 AM  Performed by: Sindy Guadeloupe, CRNAPre-anesthesia Checklist: Patient identified, Emergency Drugs available, Suction available, Patient being monitored and Timeout performed Patient Re-evaluated:Patient Re-evaluated prior to induction Oxygen Delivery Method: Circle system utilized Preoxygenation: Pre-oxygenation with 100% oxygen Induction Type: IV induction Ventilation: Mask ventilation without difficulty Laryngoscope Size: 4 and Glidescope Grade View: Grade I Tube type: Oral Tube size: 7.5 mm Number of attempts: 1 Airway Equipment and Method: Stylet Placement Confirmation: ETT inserted through vocal cords under direct vision, positive ETCO2 and breath sounds checked- equal and bilateral Secured at: 22 cm Tube secured with: Tape Dental Injury: Teeth and Oropharynx as per pre-operative assessment

## 2023-08-25 NOTE — H&P (Signed)
Subjective   Chief Complaint: New Consultation (Right Inguinal Hernia )  History of Present Illness: Bobby Gonzalez is a 57 y.o. male who is seen today as an office consultation at the request of Dr. Laury Axon for evaluation of New Consultation (Right Inguinal Hernia ) .   He first noticed the hernia 2 months ago. Symptoms are pain especially with coughing and bending. He denies nausea or vomiting or bowel habit change.  He does not smoke He does not have diabetes He had a previous left open inguinal hernia repair  Review of Systems: A complete review of systems was obtained from the patient. I have reviewed this information and discussed as appropriate with the patient. See HPI as well for other ROS.  Review of Systems  Constitutional: Negative.  HENT: Negative.  Eyes: Negative.  Respiratory: Negative.  Cardiovascular: Negative.  Gastrointestinal: Positive for abdominal pain.  Genitourinary: Negative.  Musculoskeletal: Negative.  Skin: Negative.  Neurological: Negative.  Endo/Heme/Allergies: Negative.  Psychiatric/Behavioral: Negative.   Medical History: Past Medical History:  Diagnosis Date  Arthritis  GERD (gastroesophageal reflux disease)  Hyperlipidemia  Hypertension  Thyroid disease   There is no problem list on file for this patient.  Past Surgical History:  Procedure Laterality Date  LARYNGOSCOPY AND ESOPHAGOSCOPY Bilateral 02/17/2012  Dr. Margo Aye  LAPAROSCOPIC NEPHRECTOMY (Right) Right 06/29/2012  Dr. Luella Cook  HERNIA REPAIR INGUINAL ADULT (Left: Groin) INSERTION OF MESH 11/04/2012  Dr. Luisa Hart   Allergies  Allergen Reactions  Banana Rash and Shortness Of Breath  Unknown  Other reaction(s): Unknown  Sneezing  Penicillin Other (See Comments)  Unknown, was told as a child he was allergic   Current Outpatient Medications on File Prior to Visit  Medication Sig Dispense Refill  cetirizine (ZYRTEC) 10 MG tablet Take 10 mg by mouth once daily   clopidogreL (PLAVIX) 75 mg tablet Take by mouth  levothyroxine (SYNTHROID) 88 MCG tablet Take by mouth  pilocarpine (SALAGEN) 5 mg tablet Take 1 tablet by mouth 3 (three) times daily  rosuvastatin (CRESTOR) 10 MG tablet Take 1 tablet by mouth once daily  valsartan (DIOVAN) 40 MG tablet Take 40 mg by mouth once daily   No current facility-administered medications on file prior to visit.   Family History  Problem Relation Age of Onset  High blood pressure (Hypertension) Father  Stroke Father  High blood pressure (Hypertension) Brother  Thyroid disease Brother  Throat cancer Brother   Social History   Tobacco Use  Smoking Status Never  Smokeless Tobacco Never   Social History   Socioeconomic History  Marital status: Married  Tobacco Use  Smoking status: Never  Smokeless tobacco: Never  Substance and Sexual Activity  Alcohol use: Never  Drug use: Never   Objective:   Vitals:  08/22/22 1421  BP: (!) 140/80  Pulse: 81  Temp: 36.4 C (97.6 F)  SpO2: 98%  Weight: 83.9 kg (185 lb)  Height: 177.8 cm (5\' 10" )   Body mass index is 26.54 kg/m.  Physical Exam Constitutional:  Appearance: Normal appearance.  HENT:  Head: Normocephalic and atraumatic.  Pulmonary:  Effort: Pulmonary effort is normal.  Abdominal:  Comments: Moderate right inguinal hernia, no recurrent left inguinal hernia  Musculoskeletal:  General: Normal range of motion.  Cervical back: Normal range of motion.  Neurological:  General: No focal deficit present.  Mental Status: He is alert and oriented to person, place, and time. Mental status is at baseline.  Psychiatric:  Mood and Affect: Mood normal.  Behavior: Behavior  normal.  Thought Content: Thought content normal.     Labs, Imaging and Diagnostic Testing: I reviewed notes by Lemar Livings.  Assessment and Plan:  Diagnoses and all orders for this visit:  Stenosis of left carotid artery without cerebral infarction  Non-recurrent  unilateral inguinal hernia without obstruction or gangrene  We discussed etiology of hernias and how they can cause pain. We discussed options for inguinal hernia repair vs observation. We discussed details of the surgery of general anesthesia, surgical approach and incisions, dissecting the sack away from vas deference, testicular vessels and nerves and placement of mesh. We discussed risks of bleeding, infection, recurrence, injury to vas deference, testicular vessels, nerve injury, and chronic pain. He showed good understanding and wanted proceed with open right inguinal hernia repair as outpatient.  Due to the carotid artery stenosis, he may have a longer wait time before being able to proceed

## 2023-08-25 NOTE — Op Note (Signed)
Preop diagnosis: right inguinal hernia  Postop diagnosis: right inguinal hernia  Procedure: open Right inguinal hernia repair with mesh  Surgeon: Feliciana Rossetti, M.D.  Asst: none  Anesthesia: Gen.   Indications for procedure: Bobby Gonzalez is a 57 y.o. male with symptoms of pain and enlarging Right inguinal hernia(s). After discussing risks, alternatives and benefits he decided on open repair and was brought to day surgery for repair.  Description of procedure: The patient was brought into the operative suite, placed supine. Anesthesia was administered with endotracheal tube. Patient was strapped in place. The patient was prepped and draped in the usual sterile fashion.  The anterior superior iliac spine and pubic tubercle were identified on the Right side. An incision was made 1cm above the connecting line, representative of the location of the inguinal ligament. The subcutaneous tissue was bluntly dissected, scarpa's fascia was dissected away. The external abdominal oblique fascia was identified and sharply opened down to the external inguinal ring. The conjoint tendon and inguinal ligament were identified. The cord structures and sac were dissected free of the surrounding tissue in 360 degrees. A penrose drain was used to encircle the contents. The cremasteric fibers were dissected free of the contents of the cord and hernia sac. The cord structures (vessels and vas deferens) were identified and carefully dissected away from the hernia sac. The hernia sac was reduced and contained no visceral structures.The hernia sac was dissected down to the internal inguinal ring. Preperitoneal fat was identified showing appropriate dissection. The sac was then reduced into the preperitoneal space. There was a moderate indirect hernia and a small direct inguinal hernia. 2-0 PDS was used to appose the conjoint tendon to the inguinal ligament. A 3x6 Bard mesh was then used to close the defect and reinforce the  floor. The mesh was sutured to the lacunar ligament and inguinal ligament using a 2-0 prolene in running fashion. Next the superior edge of the mesh was sutured to the conjoined tendon using a 2-0 running Prolene. An additional 2-0 Prolene was used to suture the tail ends of the mesh together re-creating the deep ring. Cord structures are running in a neutral position through the mesh. Next the external abdominal oblique fascia was closed with a 2-0 Vicryl in interrupted fashion to re-create the external inguinal ring. Scarpa's fascia was closed with 3-0 Vicryl in running fashion. Skin was closed with a 4-0 Monocryl subcuticular stitch in running fashion. Dermabond place for dressing. Patient woke from anesthesia and brought to PACU in stable condition. All counts are correct.    Findings: right direct and indirect inguinal hernia  Specimen: none  Blood loss: 20 ml  Local anesthesia: none  Complications: none  Implant: 3 x 6 Bard mesh  Feliciana Rossetti, M.D. General, Bariatric, & Minimally Invasive Surgery [facility], [place] 8:58 AM 08/25/2023

## 2023-08-25 NOTE — Discharge Instructions (Signed)
[facility] _______[facility]  UMBILICAL OR INGUINAL HERNIA REPAIR: POST OP INSTRUCTIONS  Always review your discharge instruction sheet given to you by the facility where your surgery was performed. IF YOU HAVE DISABILITY OR FAMILY LEAVE FORMS, YOU MUST BRING THEM TO THE OFFICE FOR PROCESSING.   DO NOT GIVE THEM TO YOUR DOCTOR.  1. A  prescription for pain medication may be given to you upon discharge.  Take your pain medication as prescribed, if needed.  If narcotic pain medicine is not needed, then you may take acetaminophen (Tylenol) or ibuprofen (Advil) as needed. 2. Take your usually prescribed medications unless otherwise directed. If you need a refill on your pain medication, please contact your pharmacy.  They will contact our office to request authorization. Prescriptions will not be filled after 5 pm or on week-ends. 3. You should follow a light diet the first 24 hours after arrival home, such as soup and crackers, etc.  Be sure to include lots of fluids daily.  Resume your normal diet the day after surgery. 4.Most patients will experience some swelling and bruising around the umbilicus or in the groin and scrotum.  Ice packs and reclining will help.  Swelling and bruising can take several days to resolve.  6. It is common to experience some constipation if taking pain medication after surgery.  Increasing fluid intake and taking a stool softener (such as Colace) will usually help or prevent this problem from occurring.  A mild laxative (Milk of Magnesia or Miralax) should be taken according to package directions if there are no bowel movements after 48 hours. 7. Unless discharge instructions indicate otherwise, you may remove your bandages 24-48 hours after surgery, and you may shower at that time.  You may have steri-strips (small skin tapes) in place directly over the incision.  These strips should be left on the skin for 7-10 days.  If your surgeon used skin glue on the  incision, you may shower in 24 hours.  The glue will flake off over the next 2-3 weeks.  Any sutures or staples will be removed at the office during your follow-up visit. 8. ACTIVITIES:  You may resume regular (light) daily activities beginning the next day--such as daily self-care, walking, climbing stairs--gradually increasing activities as tolerated.  You may have sexual intercourse when it is comfortable.  Refrain from any heavy lifting or straining until approved by your doctor.  a.You may drive when you are no longer taking prescription pain medication, you can comfortably wear a seatbelt, and you can safely maneuver your car and apply brakes. b.RETURN TO WORK:   _____________________________________________  9.You should see your doctor in the office for a follow-up appointment approximately 2-3 weeks after your surgery.  Make sure that you call for this appointment within a day or two after you arrive home to insure a convenient appointment time. 10.OTHER INSTRUCTIONS: _________________________    _____________________________________  WHEN TO CALL YOUR DOCTOR: Fever over 101.0 Inability to urinate Nausea and/or vomiting Extreme swelling or bruising Continued bleeding from incision. Increased pain, redness, or drainage from the incision  The clinic staff is available to answer your questions during regular business hours.  Please don't hesitate to call and ask to speak to one of the nurses for clinical concerns.  If you have a medical emergency, go to the nearest emergency room or call 911.  A surgeon from [facility] is always on call at the hospital   [address], [address],   [place], [place]  [address] ?  [address] [address], [place], [place]   [address] [phone] ? [phone] ? FAX [phone] Web site: [email or web address]  

## 2023-08-25 NOTE — Anesthesia Procedure Notes (Addendum)
  Anesthesia Regional Block: TAP block   Pre-Anesthetic Checklist: , timeout performed,  Correct Patient, Correct Site, Correct Laterality,  Correct Procedure, Correct Position, site marked,  Risks and benefits discussed,  Surgical consent,  Pre-op evaluation,  At surgeon's request and post-op pain management  Laterality: Right  Prep: chloraprep       Needles:  Injection technique: Single-shot  Needle Type: Echogenic Stimulator Needle     Needle Length: 10cm  Needle Gauge: 21   Needle insertion depth: 8 cm   Additional Needles:   Procedures:,,,, ultrasound used (permanent image in chart),,    Narrative:  Start time: 08/25/2023 7:25 AM End time: 08/25/2023 7:30 AM Injection made incrementally with aspirations every 5 mL.  Performed by: Personally  Anesthesiologist: Mal Amabile, MD  Additional Notes: Timeout performed. Patient sedated. Relevant anatomy ID'd using [place]. Incremental 2-12ml injection of LA with frequent aspiration. Patient tolerated procedure well.

## 2023-08-25 NOTE — Transfer of Care (Signed)
Immediate Anesthesia Transfer of Care Note  Patient: Bobby Gonzalez  Procedure(s) Performed: OPEN RIGHT INGUINAL HERNIA REPAIR WITH MESH (Right)  Patient Location: PACU  Anesthesia Type:General  Level of Consciousness: drowsy and patient cooperative  Airway & Oxygen Therapy: Patient Spontanous Breathing and Patient connected to face mask oxygen  Post-op Assessment: Report given to RN and Post -op Vital signs reviewed and stable  Post vital signs: Reviewed and stable  Last Vitals:  Vitals Value Taken Time  BP 141/91 08/25/23 0913  Temp    Pulse 57 08/25/23 0914  Resp 11 08/25/23 0914  SpO2 100 % 08/25/23 0914  Vitals shown include unfiled device data.  Last Pain:  Vitals:   08/25/23 0619  TempSrc:   PainSc: 0-No pain         Complications: No notable events documented.

## 2023-08-25 NOTE — Anesthesia Postprocedure Evaluation (Signed)
Anesthesia Post Note  Patient: Bobby Gonzalez  Procedure(s) Performed: OPEN RIGHT INGUINAL HERNIA REPAIR WITH MESH (Right)     Patient location during evaluation: PACU Anesthesia Type: General Level of consciousness: awake and alert and oriented Pain management: pain level controlled Vital Signs Assessment: post-procedure vital signs reviewed and stable Respiratory status: spontaneous breathing, nonlabored ventilation and respiratory function stable Cardiovascular status: blood pressure returned to baseline and stable Postop Assessment: no apparent nausea or vomiting Anesthetic complications: no   No notable events documented.  Last Vitals:  Vitals:   08/25/23 0930 08/25/23 0940  BP: 118/68   Pulse: (!) 52 62  Resp: 12 11  Temp:    SpO2: 97% 97%    Last Pain:  Vitals:   08/25/23 0940  TempSrc:   PainSc: 4                  Bobby Uzelac A.

## 2023-08-26 ENCOUNTER — Encounter (HOSPITAL_COMMUNITY): Payer: Self-pay | Admitting: General Surgery

## 2023-08-28 ENCOUNTER — Other Ambulatory Visit (HOSPITAL_COMMUNITY): Payer: Self-pay

## 2023-08-28 ENCOUNTER — Encounter (HOSPITAL_COMMUNITY): Payer: Self-pay | Admitting: General Surgery

## 2023-08-29 ENCOUNTER — Other Ambulatory Visit (HOSPITAL_COMMUNITY): Payer: Self-pay

## 2023-09-01 ENCOUNTER — Telehealth: Payer: Self-pay

## 2023-09-01 DIAGNOSIS — E039 Hypothyroidism, unspecified: Secondary | ICD-10-CM

## 2023-09-01 MED ORDER — LEVOTHYROXINE SODIUM 88 MCG PO TABS: 88.0000 ug | ORAL_TABLET | Freq: Every day | ORAL | 0 refills | Status: DC

## 2023-09-01 NOTE — Addendum Note (Signed)
Addended by: Roxanne Gates on: 09/01/2023 03:29 PM   Modules accepted: Orders

## 2023-09-01 NOTE — Telephone Encounter (Signed)
Copied from CRM [id number]. Topic: Clinical - Medication Question >> Sep 01, 2023  7:55 AM Fonda Kinder J wrote: Reason for CRM: Pt states he is out of town and he left his levothyroxine (SYNTHROID) 88 MCG back home, he wants to know if he can be called in a quantity of 5 to the [facility] pharmacy in [place] [phone]. Pt is requesting a follow up [phone]

## 2023-09-02 NOTE — Telephone Encounter (Signed)
Rx sent 

## 2023-09-22 ENCOUNTER — Other Ambulatory Visit: Payer: Self-pay

## 2023-09-22 ENCOUNTER — Other Ambulatory Visit: Payer: Self-pay | Admitting: Vascular Surgery

## 2023-09-22 ENCOUNTER — Other Ambulatory Visit (HOSPITAL_COMMUNITY): Payer: Self-pay

## 2023-09-22 DIAGNOSIS — M9901 Segmental and somatic dysfunction of cervical region: Secondary | ICD-10-CM | POA: Diagnosis not present

## 2023-09-22 DIAGNOSIS — M531 Cervicobrachial syndrome: Secondary | ICD-10-CM | POA: Diagnosis not present

## 2023-09-22 DIAGNOSIS — M9902 Segmental and somatic dysfunction of thoracic region: Secondary | ICD-10-CM | POA: Diagnosis not present

## 2023-09-22 MED ORDER — CLOPIDOGREL BISULFATE 75 MG PO TABS
75.0000 mg | ORAL_TABLET | Freq: Every day | ORAL | 11 refills | Status: DC
  Filled 2023-09-22: qty 30, 30d supply, fill #0
  Filled 2023-10-15: qty 30, 30d supply, fill #1
  Filled 2023-11-17: qty 30, 30d supply, fill #2
  Filled 2023-12-16: qty 30, 30d supply, fill #3
  Filled 2024-01-11: qty 30, 30d supply, fill #4
  Filled 2024-02-15: qty 30, 30d supply, fill #5
  Filled 2024-03-17: qty 30, 30d supply, fill #6
  Filled 2024-04-14 (×2): qty 30, 30d supply, fill #7
  Filled 2024-05-21: qty 30, 30d supply, fill #8
  Filled 2024-06-23: qty 30, 30d supply, fill #9
  Filled 2024-07-19: qty 30, 30d supply, fill #10
  Filled 2024-08-17: qty 30, 30d supply, fill #11

## 2023-09-23 DIAGNOSIS — M9902 Segmental and somatic dysfunction of thoracic region: Secondary | ICD-10-CM | POA: Diagnosis not present

## 2023-09-23 DIAGNOSIS — M9901 Segmental and somatic dysfunction of cervical region: Secondary | ICD-10-CM | POA: Diagnosis not present

## 2023-09-23 DIAGNOSIS — M531 Cervicobrachial syndrome: Secondary | ICD-10-CM | POA: Diagnosis not present

## 2023-09-24 DIAGNOSIS — M9902 Segmental and somatic dysfunction of thoracic region: Secondary | ICD-10-CM | POA: Diagnosis not present

## 2023-09-24 DIAGNOSIS — M9901 Segmental and somatic dysfunction of cervical region: Secondary | ICD-10-CM | POA: Diagnosis not present

## 2023-09-24 DIAGNOSIS — M531 Cervicobrachial syndrome: Secondary | ICD-10-CM | POA: Diagnosis not present

## 2023-09-29 ENCOUNTER — Other Ambulatory Visit (HOSPITAL_COMMUNITY): Payer: Self-pay

## 2023-09-29 ENCOUNTER — Other Ambulatory Visit: Payer: Self-pay | Admitting: Family Medicine

## 2023-09-29 DIAGNOSIS — M9902 Segmental and somatic dysfunction of thoracic region: Secondary | ICD-10-CM | POA: Diagnosis not present

## 2023-09-29 DIAGNOSIS — E039 Hypothyroidism, unspecified: Secondary | ICD-10-CM

## 2023-09-29 DIAGNOSIS — M531 Cervicobrachial syndrome: Secondary | ICD-10-CM | POA: Diagnosis not present

## 2023-09-29 DIAGNOSIS — M9901 Segmental and somatic dysfunction of cervical region: Secondary | ICD-10-CM | POA: Diagnosis not present

## 2023-09-30 ENCOUNTER — Other Ambulatory Visit (HOSPITAL_COMMUNITY): Payer: Self-pay

## 2023-09-30 MED ORDER — LEVOTHYROXINE SODIUM 88 MCG PO TABS
88.0000 ug | ORAL_TABLET | Freq: Every day | ORAL | 1 refills | Status: DC
  Filled 2023-09-30: qty 90, 90d supply, fill #0
  Filled 2024-01-02: qty 30, 30d supply, fill #1
  Filled 2024-02-01: qty 30, 30d supply, fill #2
  Filled 2024-03-05: qty 30, 30d supply, fill #3

## 2023-10-01 DIAGNOSIS — M9902 Segmental and somatic dysfunction of thoracic region: Secondary | ICD-10-CM | POA: Diagnosis not present

## 2023-10-01 DIAGNOSIS — M9901 Segmental and somatic dysfunction of cervical region: Secondary | ICD-10-CM | POA: Diagnosis not present

## 2023-10-01 DIAGNOSIS — M531 Cervicobrachial syndrome: Secondary | ICD-10-CM | POA: Diagnosis not present

## 2023-10-04 ENCOUNTER — Other Ambulatory Visit (HOSPITAL_COMMUNITY): Payer: Self-pay

## 2023-10-06 DIAGNOSIS — M531 Cervicobrachial syndrome: Secondary | ICD-10-CM | POA: Diagnosis not present

## 2023-10-06 DIAGNOSIS — M9901 Segmental and somatic dysfunction of cervical region: Secondary | ICD-10-CM | POA: Diagnosis not present

## 2023-10-06 DIAGNOSIS — M9902 Segmental and somatic dysfunction of thoracic region: Secondary | ICD-10-CM | POA: Diagnosis not present

## 2023-10-08 ENCOUNTER — Telehealth: Payer: Self-pay

## 2023-10-08 DIAGNOSIS — M9901 Segmental and somatic dysfunction of cervical region: Secondary | ICD-10-CM | POA: Diagnosis not present

## 2023-10-08 DIAGNOSIS — M9902 Segmental and somatic dysfunction of thoracic region: Secondary | ICD-10-CM | POA: Diagnosis not present

## 2023-10-08 DIAGNOSIS — M531 Cervicobrachial syndrome: Secondary | ICD-10-CM | POA: Diagnosis not present

## 2023-10-08 NOTE — Telephone Encounter (Signed)
 Advice only:  -pt called triage nurse line inquiring about having any restrictions for exercise because he had a stent placed by Dr. Sheree 09/29/22. -on chart review, pt had a L TCAR for asymptomatic stenosis and does not have any restrictions based on that.  -pt identity verified x 2 identifiers and confirms understanding.  Pt advised to check with his other doctors as well.

## 2023-10-15 ENCOUNTER — Other Ambulatory Visit (HOSPITAL_COMMUNITY): Payer: Self-pay

## 2023-10-16 ENCOUNTER — Other Ambulatory Visit (HOSPITAL_COMMUNITY): Payer: Self-pay

## 2023-10-20 DIAGNOSIS — M531 Cervicobrachial syndrome: Secondary | ICD-10-CM | POA: Diagnosis not present

## 2023-10-20 DIAGNOSIS — M9901 Segmental and somatic dysfunction of cervical region: Secondary | ICD-10-CM | POA: Diagnosis not present

## 2023-10-20 DIAGNOSIS — M9902 Segmental and somatic dysfunction of thoracic region: Secondary | ICD-10-CM | POA: Diagnosis not present

## 2023-10-21 DIAGNOSIS — M9902 Segmental and somatic dysfunction of thoracic region: Secondary | ICD-10-CM | POA: Diagnosis not present

## 2023-10-21 DIAGNOSIS — M531 Cervicobrachial syndrome: Secondary | ICD-10-CM | POA: Diagnosis not present

## 2023-10-21 DIAGNOSIS — M9901 Segmental and somatic dysfunction of cervical region: Secondary | ICD-10-CM | POA: Diagnosis not present

## 2023-10-27 DIAGNOSIS — I129 Hypertensive chronic kidney disease with stage 1 through stage 4 chronic kidney disease, or unspecified chronic kidney disease: Secondary | ICD-10-CM | POA: Diagnosis not present

## 2023-10-27 DIAGNOSIS — N183 Chronic kidney disease, stage 3 unspecified: Secondary | ICD-10-CM | POA: Diagnosis not present

## 2023-10-27 DIAGNOSIS — N2581 Secondary hyperparathyroidism of renal origin: Secondary | ICD-10-CM | POA: Diagnosis not present

## 2023-10-27 DIAGNOSIS — C641 Malignant neoplasm of right kidney, except renal pelvis: Secondary | ICD-10-CM | POA: Diagnosis not present

## 2023-11-04 DIAGNOSIS — M531 Cervicobrachial syndrome: Secondary | ICD-10-CM | POA: Diagnosis not present

## 2023-11-04 DIAGNOSIS — M9901 Segmental and somatic dysfunction of cervical region: Secondary | ICD-10-CM | POA: Diagnosis not present

## 2023-11-04 DIAGNOSIS — M9902 Segmental and somatic dysfunction of thoracic region: Secondary | ICD-10-CM | POA: Diagnosis not present

## 2023-11-17 ENCOUNTER — Other Ambulatory Visit: Payer: Self-pay | Admitting: Family Medicine

## 2023-11-17 ENCOUNTER — Other Ambulatory Visit (HOSPITAL_COMMUNITY): Payer: Self-pay

## 2023-11-17 MED ORDER — PILOCARPINE HCL 5 MG PO TABS
5.0000 mg | ORAL_TABLET | Freq: Three times a day (TID) | ORAL | 0 refills | Status: DC
  Filled 2023-11-17: qty 90, 30d supply, fill #0

## 2023-11-24 DIAGNOSIS — M9902 Segmental and somatic dysfunction of thoracic region: Secondary | ICD-10-CM | POA: Diagnosis not present

## 2023-11-24 DIAGNOSIS — M9901 Segmental and somatic dysfunction of cervical region: Secondary | ICD-10-CM | POA: Diagnosis not present

## 2023-11-24 DIAGNOSIS — M531 Cervicobrachial syndrome: Secondary | ICD-10-CM | POA: Diagnosis not present

## 2023-12-08 ENCOUNTER — Other Ambulatory Visit (HOSPITAL_COMMUNITY): Payer: Self-pay

## 2023-12-08 ENCOUNTER — Encounter: Payer: Self-pay | Admitting: Family Medicine

## 2023-12-08 ENCOUNTER — Ambulatory Visit: Admitting: Family Medicine

## 2023-12-08 VITALS — BP 122/80 | HR 60 | Ht 70.0 in | Wt 199.6 lb

## 2023-12-08 DIAGNOSIS — R1313 Dysphagia, pharyngeal phase: Secondary | ICD-10-CM | POA: Diagnosis not present

## 2023-12-08 MED ORDER — PANTOPRAZOLE SODIUM 40 MG PO TBEC
40.0000 mg | DELAYED_RELEASE_TABLET | Freq: Every day | ORAL | 1 refills | Status: DC
  Filled 2023-12-08: qty 30, 30d supply, fill #0
  Filled 2024-01-02: qty 30, 30d supply, fill #1

## 2023-12-08 NOTE — Patient Instructions (Signed)
 If you do not hear anything about your referral in the next 1-2 weeks, call our office and ask for an update.  Chew carefully.   Avoid tougher meats if able.   Let us know if you need anything.

## 2023-12-08 NOTE — Progress Notes (Signed)
 Chief Complaint  Patient presents with   Abdominal Pain    Patient presents today for lower abdominal pain from previous surgery. He report trouble swallowing as well.    Subjective: Patient is a 58 y.o. male here for difficulty swallowing.  Going on for around a year. Seems to be getting worse. Has trouble getting food stuck in his lower neck region. Feels tight in that area. Liquids seem better. Not a smoker. Has never had a hx of this. No famhx.   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 radiation and chemo 05-13-12(Dr. Gaylyn Rong)   Cancer of other and ill-defined sites within the digestive organs and peritoneum (HCC) 01/18/2022   Cardiac murmur 09/29/2020   Carotid atherosclerosis 03/01/2021   Carotid stenosis 09/05/2022   Chronic kidney disease    right renal mass 7.3x6.7x6.8cm renal cell ca   Difficult intubation    POSSIBLE DIFFICULT INTUBATION--S/P RADIATION FOR CANCER LEFT TONSIL--EATING BUT THROAT STILL SORE AND PT HAS THICK MUCUS   Diverticula of colon 03/10/2012   multiple rectosigmoid colonic diverticula/ ct abd/pelvis   Dry mouth 05/16/2014   Essential hypertension 10/11/2019   GERD (gastroesophageal reflux disease)    Hearing loss of right ear 05/29/2020   History of cancer tonsil 09/20/2016   History of head and neck radiation 09/20/2016   History of radiation therapy 03/30/12-05/15/12   left tonsil   Hyperlipidemia 05/21/2021   Inguinal hernia 10/23/2012   Malignant tumor of kidney (HCC) 09/27/2020   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   Multiple lung nodules on CT 05/16/2014   Other fatigue 03/04/2022   Personal history of malignant neoplasm of unspecified site of lip, oral cavity, and pharynx 09/27/2020    Personal history of other malignant neoplasm of kidney 09/27/2020   Preventative health care 05/16/2014   Preventive measure 05/16/2014   Renal cell carcinoma (HCC) 09/09/2012   Sensorineural hearing loss, bilateral 09/20/2016   Solitary kidney, acquired 09/29/2020   Solitary pulmonary nodule 09/27/2020   Tinnitus aurium, right 05/29/2020   Tonsil cancer (HCC) 02/2012   SCCa of Left tonsil-S/P biopsy on 02/17/12.    Objective: BP 122/80   Pulse 60   Ht 5\' 10"  (1.778 m)   Wt 199 lb 9.6 oz (90.5 kg)   SpO2 97%   BMI 28.64 kg/m  General: Awake, appears stated age Mouth: MMM, no masses noted Neck: Supple, symmetric, no edema or ttp Lungs: No accessory muscle use Psych: Age appropriate judgment and insight, normal affect and mood  Assessment and Plan: Pharyngeal dysphagia - Plan: Ambulatory referral to ENT, pantoprazole (PROTONIX) 40 MG tablet  Refer ENT for possible scope. Trial PPI. +hx of reflux. Not a smoker. If neg, would consider GI referral, but does not appear to be in esophageal region. F/u w reg PCP as originally scheduled.  The patient voiced understanding and agreement to the plan.  Jilda Roche Cerulean, DO 12/08/23  10:08 AM

## 2023-12-22 DIAGNOSIS — M9902 Segmental and somatic dysfunction of thoracic region: Secondary | ICD-10-CM | POA: Diagnosis not present

## 2023-12-22 DIAGNOSIS — M5031 Other cervical disc degeneration,  high cervical region: Secondary | ICD-10-CM | POA: Diagnosis not present

## 2023-12-22 DIAGNOSIS — M9901 Segmental and somatic dysfunction of cervical region: Secondary | ICD-10-CM | POA: Diagnosis not present

## 2023-12-22 DIAGNOSIS — M531 Cervicobrachial syndrome: Secondary | ICD-10-CM | POA: Diagnosis not present

## 2023-12-25 DIAGNOSIS — Z85818 Personal history of malignant neoplasm of other sites of lip, oral cavity, and pharynx: Secondary | ICD-10-CM | POA: Diagnosis not present

## 2023-12-25 DIAGNOSIS — Z923 Personal history of irradiation: Secondary | ICD-10-CM | POA: Diagnosis not present

## 2023-12-25 DIAGNOSIS — R1314 Dysphagia, pharyngoesophageal phase: Secondary | ICD-10-CM | POA: Diagnosis not present

## 2023-12-26 ENCOUNTER — Encounter: Payer: Self-pay | Admitting: Otolaryngology

## 2023-12-26 ENCOUNTER — Other Ambulatory Visit: Payer: Self-pay | Admitting: Otolaryngology

## 2023-12-26 DIAGNOSIS — R1314 Dysphagia, pharyngoesophageal phase: Secondary | ICD-10-CM

## 2023-12-26 DIAGNOSIS — Z85818 Personal history of malignant neoplasm of other sites of lip, oral cavity, and pharynx: Secondary | ICD-10-CM

## 2023-12-26 DIAGNOSIS — Z923 Personal history of irradiation: Secondary | ICD-10-CM

## 2023-12-29 ENCOUNTER — Other Ambulatory Visit (HOSPITAL_BASED_OUTPATIENT_CLINIC_OR_DEPARTMENT_OTHER): Payer: Self-pay

## 2023-12-29 ENCOUNTER — Encounter: Payer: Self-pay | Admitting: Family Medicine

## 2023-12-29 ENCOUNTER — Ambulatory Visit: Admitting: Family Medicine

## 2023-12-29 ENCOUNTER — Ambulatory Visit (HOSPITAL_BASED_OUTPATIENT_CLINIC_OR_DEPARTMENT_OTHER)
Admission: RE | Admit: 2023-12-29 | Discharge: 2023-12-29 | Disposition: A | Discharge status: Home / Self Care | Source: Ambulatory Visit | Attending: Family Medicine | Admitting: Family Medicine

## 2023-12-29 VITALS — BP 112/80 | HR 82 | Temp 98.4°F | Resp 16 | Ht 70.0 in | Wt 191.4 lb

## 2023-12-29 DIAGNOSIS — R058 Other specified cough: Secondary | ICD-10-CM | POA: Diagnosis not present

## 2023-12-29 DIAGNOSIS — J40 Bronchitis, not specified as acute or chronic: Secondary | ICD-10-CM | POA: Diagnosis not present

## 2023-12-29 DIAGNOSIS — R1310 Dysphagia, unspecified: Secondary | ICD-10-CM | POA: Diagnosis not present

## 2023-12-29 DIAGNOSIS — R0602 Shortness of breath: Secondary | ICD-10-CM | POA: Diagnosis not present

## 2023-12-29 MED ORDER — AIRSUPRA 90-80 MCG/ACT IN AERO
2.0000 | INHALATION_SPRAY | Freq: Four times a day (QID) | RESPIRATORY_TRACT | 5 refills | Status: AC | PRN
  Filled 2023-12-29: qty 10.7, 30d supply, fill #0
  Filled 2024-01-06: qty 10.7, 25d supply, fill #0
  Filled 2024-01-06: qty 10.7, 30d supply, fill #0

## 2023-12-29 MED ORDER — PROMETHAZINE-DM 6.25-15 MG/5ML PO SYRP
5.0000 mL | ORAL_SOLUTION | Freq: Four times a day (QID) | ORAL | 0 refills | Status: AC | PRN
  Filled 2023-12-29: qty 118, 6d supply, fill #0

## 2023-12-29 MED ORDER — AZITHROMYCIN 250 MG PO TABS
ORAL_TABLET | ORAL | 0 refills | Status: AC
Start: 2023-12-29 — End: ?
  Filled 2023-12-29: qty 6, 5d supply, fill #0

## 2023-12-29 MED ORDER — PREDNISONE 10 MG PO TABS
ORAL_TABLET | ORAL | 0 refills | Status: AC
  Filled 2023-12-29: qty 20, 12d supply, fill #0

## 2023-12-29 NOTE — Progress Notes (Signed)
 Established Patient Office Visit  Subjective   Patient ID: Bobby Gonzalez, male    DOB: [birth year]  Age: 58 y.o. MRN: [id number]  Chief Complaint  Patient presents with   Cough    Sxs started Friday, productive cough, runny nose, sore throat (no pain with swallowing), Pt states using Nyquil.     HPI Discussed the use of AI scribe software for clinical note transcription with the patient, who gave verbal consent to proceed.  History of Present Illness Bobby Gonzalez "Bobby Gonzalez" is a 58 year old male who presents with a productive cough, runny nose, and sore throat.  He has been experiencing a productive cough, runny nose, and sore throat since Friday. Initially, he attributed these symptoms to allergies, possibly from grass and pollen. The cough is severe, with a 'rattle' and can last for 10 to 15 minutes at night, causing significant discomfort. He feels 'worn out' by the afternoon, which is unusual for him.  He has been using Nyquil since Saturday night to manage his symptoms. Additionally, he uses Zyrtec and Astelin  nasal spray twice daily, in the morning and at night, for his allergies. He experiences sinus pressure and a tight feeling in his chest, along with occasional wheezing. No history of asthma as a child.  The cough sometimes causes back pain, described as feeling like 'somebody punched me in the back.' He also has difficulty swallowing food and is scheduled for a swallow study tomorrow. Occasionally, he notices a small amount of green or dark sputum in the morning, but most of his nasal discharge is clear.  He is currently taking a generic form of Sertraline, though the dosage and frequency were not specified.   Patient Active Problem List   Diagnosis Date Noted   Preop cardiovascular exam 08/12/2023   Acute cough 06/12/2023   Pneumonia 11/26/2022   Carotid stenosis 09/05/2022   Asymptomatic carotid artery stenosis, left 09/05/2022   Other fatigue 03/04/2022   Viral  upper respiratory tract infection 03/04/2022   Cancer of other and ill-defined sites within the digestive organs and peritoneum (HCC) 01/18/2022   Hyperlipidemia 05/21/2021   Carotid atherosclerosis 03/01/2021   GERD (gastroesophageal reflux disease)    Dizziness 11/13/2020   Syncope 09/29/2020   Solitary kidney, acquired 09/29/2020   Cardiac murmur 09/29/2020   Malignant tumor of kidney (HCC) 09/27/2020   Personal history of malignant neoplasm of unspecified site of lip, oral cavity, and pharynx 09/27/2020   Personal history of other malignant neoplasm of kidney 09/27/2020   Acquired hypothyroidism 09/27/2020   Solitary pulmonary nodule 09/27/2020   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   Cancer (HCC)    Difficult intubation    Gastro-esophageal reflux disease without esophagitis    History of radiation therapy    Pain    Hearing loss of right ear 05/29/2020   Tinnitus aurium, right 05/29/2020   Essential hypertension 10/11/2019   History of cancer tonsil 09/20/2016   History of head and neck radiation 09/20/2016   Sensorineural hearing loss, bilateral 09/20/2016   Multiple lung nodules on CT 05/16/2014   Dry mouth 05/16/2014   Preventative health care 05/16/2014   Preventive measure 05/16/2014   Hypothyroid 02/15/2013   Inguinal hernia 10/23/2012   Renal cell carcinoma (HCC) 09/09/2012   HTN (hypertension) 06/25/2012   Dehydration 05/13/2012   Phlegm in throat 05/13/2012   Renal mass 03/30/2012   Allergy    Chronic kidney disease    Diverticula of colon 03/10/2012   Tonsil  cancer (HCC) 02/01/2012   Past Medical History:  Diagnosis Date   Acquired hypothyroidism 09/27/2020   Acute cough 06/12/2023   Allergy    pcns   Arthritis    Asymptomatic carotid artery stenosis, left 09/05/2022   Cancer (HCC) 02/17/12 bx   left tonsil squamous cell ca,HPV positive, ,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 radiation and chemo 05-13-12(Dr. Dossie Gayer)   Cancer of other and  ill-defined sites within the digestive organs and peritoneum (HCC) 01/18/2022   Cardiac murmur 09/29/2020   Carotid atherosclerosis 03/01/2021   Carotid stenosis 09/05/2022   Chronic kidney disease    right renal mass 7.3x6.7x6.8cm renal cell ca   Difficult intubation    POSSIBLE DIFFICULT INTUBATION--S/P RADIATION FOR CANCER LEFT TONSIL--EATING BUT THROAT STILL SORE AND PT HAS THICK MUCUS   Diverticula of colon 03/10/2012   multiple rectosigmoid colonic diverticula/ ct abd/pelvis   Dry mouth 05/16/2014   Essential hypertension 10/11/2019   GERD (gastroesophageal reflux disease)    Hearing loss of right ear 05/29/2020   History of cancer tonsil 09/20/2016   History of head and neck radiation 09/20/2016   History of radiation therapy 03/30/12-05/15/12   left tonsil   Hyperlipidemia 05/21/2021   Inguinal hernia 10/23/2012   Malignant tumor of kidney (HCC) 09/27/2020   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   Past Surgical History:  Procedure Laterality Date   DIRECT LARYNGOSCOPY     S/P DL, Biopsy-Dr. Donalee Fruits. Pathology postive for SCCa of Left Tonsil   GASTROSTOMY TUBE PLACEMENT  03/02/12   INGUINAL HERNIA REPAIR Left 11/04/2012   Procedure: HERNIA REPAIR INGUINAL ADULT;  Surgeon: Brandy Cal. Cornett, MD;  Location: [facility];  Service: General;  Laterality: Left;   INGUINAL HERNIA REPAIR Right  08/25/2023   Procedure: OPEN RIGHT INGUINAL HERNIA REPAIR WITH MESH;  Surgeon: Kinsinger, Alphonso Aschoff, MD;  Location: [facility];  Service: General;  Laterality: Right;  GEN OR LMA   INSERTION OF MESH Left 11/04/2012   Procedure: INSERTION OF MESH;  Surgeon: Brandy Cal. Cornett, MD;  Location: [facility];  Service: General;  Laterality: Left;   LAPAROSCOPIC NEPHRECTOMY  06/29/2012   Procedure: LAPAROSCOPIC NEPHRECTOMY;  Surgeon: Kristeen Peto, MD;  Location: [facility];  Service: Urology;  Laterality: Right;   MULTIPLE TOOTH EXTRACTIONS  03/04/12   with DR. Mohorn   PEG TUBE PLACEMENT  06-25-12   remains in place abdomen-not using at present.   PEG TUBE REMOVAL  10/2012   tonsil biopsy  02/17/12   SCCa left tomnsil,HPV positive,spread to l cervical node   TRANSCAROTID ARTERY REVASCULARIZATION  Left 09/05/2022   Procedure: Left Transcarotid Artery Revascularization;  Surgeon: Adine Hoof, MD;  Location: [facility];  Service: Vascular;  Laterality: Left;   ULTRASOUND GUIDANCE FOR VASCULAR ACCESS Right 09/05/2022   Procedure: ULTRASOUND GUIDANCE FOR VASCULAR ACCESS RIGHT FEMORAL ARTERY;  Surgeon: Adine Hoof, MD;  Location: [facility] OR;  Service: Vascular;  Laterality: Right;   WISDOM TOOTH EXTRACTION     Social History   Tobacco Use   Smoking status: Never   Smokeless tobacco: Never  Vaping Use   Vaping status: Never Used  Substance Use Topics   Alcohol use: No    Comment: Never drank alcohol   Drug use: No   Social History   Socioeconomic History   Marital status: Married    Spouse name: Not on file   Number of children: 2   Years of education: Not on file   Highest education level: Associate degree: academic program  Occupational History    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   Right handed   Caffeine: no coffee or soda. Tea in the weekend, drinks mainly flavored water due to only having 1 kidney   Social Drivers of Corporate investment banker Strain: Not on file  Food Insecurity: Not on file  Transportation Needs: Not on file  Physical Activity: Not on file  Stress: Not on file  Social Connections: Not on file  Intimate Partner Violence: Not on file   Family Status  Relation Name Status   Father  Alive   Mother  Deceased at age 16       osteoporosis complictions   Brother  Deceased   Brother  Alive   Neg Hx  (Not Specified)  No partnership data on file   Family History  Problem Relation Age of Onset   Stroke Father    Cancer Father        Prostate   Hypertension Father    Thyroid  disease Brother    Hypertension Brother    Throat cancer Brother    Colon cancer Neg Hx    Esophageal cancer Neg Hx    Rectal cancer Neg Hx    Stomach cancer Neg Hx    Allergies  Allergen Reactions   Banana Shortness Of Breath, Rash and Other (See Comments)    Sneezing    Penicillins Other (See Comments)    Unknown per Pt       Review of Systems  Constitutional:  Negative for fever and malaise/fatigue.  HENT:  Positive for congestion, nosebleeds and sinus pain.   Eyes:  Negative for blurred vision.  Respiratory:  Positive for cough, sputum production and wheezing. Negative for shortness of breath.   Cardiovascular:  Negative for chest pain, palpitations and leg swelling.  Gastrointestinal:  Negative for abdominal pain, blood in stool and nausea.  Genitourinary:  Negative for dysuria and frequency.  Musculoskeletal:  Negative for falls.  Skin:  Negative for rash.  Neurological:  Negative for dizziness, loss  of consciousness and headaches.  Endo/Heme/Allergies:  Negative for environmental allergies.  Psychiatric/Behavioral:  Negative for depression. The patient is not nervous/anxious.       Objective:     BP 112/80 (BP Location: Right Arm, Patient Position: Sitting, Cuff Size: Normal)   Pulse 82   Temp 98.4 F (36.9 C) (  Oral)   Resp 16   Ht 5\' 10"  (1.778 m)   Wt 191 lb 6.4 oz (86.8 kg)   SpO2 96%   BMI 27.46 kg/m  BP Readings from Last 3 Encounters:  12/29/23 112/80  12/08/23 122/80  08/25/23 136/87   Wt Readings from Last 3 Encounters:  12/29/23 191 lb 6.4 oz (86.8 kg)  12/08/23 199 lb 9.6 oz (90.5 kg)  08/25/23 193 lb 0.6 oz (87.6 kg)   SpO2 Readings from Last 3 Encounters:  12/29/23 96%  12/08/23 97%  08/25/23 93%      Physical Exam Vitals and nursing note reviewed.  Constitutional:      General: He is not in acute distress.    Appearance: Normal appearance. He is well-developed.  HENT:     Head: Normocephalic and atraumatic.     Right Ear: Tympanic membrane, ear canal and external ear normal. There is no impacted cerumen.     Left Ear: Tympanic membrane, ear canal and external ear normal. There is no impacted cerumen.     Nose: Nose normal.     Mouth/Throat:     Mouth: Mucous membranes are moist.     Pharynx: Oropharynx is clear. No oropharyngeal exudate or posterior oropharyngeal erythema.  Eyes:     General: No scleral icterus.       Right eye: No discharge.        Left eye: No discharge.     Conjunctiva/sclera: Conjunctivae normal.     Pupils: Pupils are equal, round, and reactive to light.  Neck:     Thyroid : No thyromegaly.     Vascular: No JVD.  Cardiovascular:     Rate and Rhythm: Normal rate and regular rhythm.     Heart sounds: Normal heart sounds. No murmur heard. Pulmonary:     Effort: Pulmonary effort is normal. No respiratory distress.     Breath sounds: Normal breath sounds.  Abdominal:     General: Bowel sounds are normal. There is no  distension.     Palpations: Abdomen is soft. There is no mass.     Tenderness: There is no abdominal tenderness. There is no guarding or rebound.  Musculoskeletal:        General: Normal range of motion.     Cervical back: Normal range of motion and neck supple.     Right lower leg: No edema.     Left lower leg: No edema.  Lymphadenopathy:     Cervical: No cervical adenopathy.  Skin:    General: Skin is warm and dry.     Findings: No erythema or rash.  Neurological:     Mental Status: He is alert and oriented to person, place, and time.     Cranial Nerves: No cranial nerve deficit.     Motor: No abnormal muscle tone.     Deep Tendon Reflexes: Reflexes are normal and symmetric. Reflexes normal.  Psychiatric:        Mood and Affect: Mood normal.        Behavior: Behavior normal.        Thought Content: Thought content normal.        Judgment: Judgment normal.      No results found for any visits on 12/29/23.  Last CBC Lab Results  Component Value Date   WBC 7.1 08/12/2023   HGB 16.1 08/12/2023   HCT 48.5 08/12/2023   MCV 93.1 08/12/2023   MCH 30.9 08/12/2023   RDW 12.6 08/12/2023   PLT  316 08/12/2023   Last metabolic panel Lab Results  Component Value Date   GLUCOSE 99 08/12/2023   NA 136 08/12/2023   K 4.2 08/12/2023   CL 102 08/12/2023   CO2 26 08/12/2023   BUN 17 08/12/2023   CREATININE 1.26 (H) 08/12/2023   GFRNONAA >60 08/12/2023   CALCIUM  9.2 08/12/2023   PROT 6.3 06/12/2023   ALBUMIN 4.3 06/12/2023   BILITOT 0.6 06/12/2023   ALKPHOS 62 06/12/2023   AST 19 06/12/2023   ALT 20 06/12/2023   ANIONGAP 8 08/12/2023   Last lipids Lab Results  Component Value Date   CHOL 103 06/12/2023   HDL 45.10 06/12/2023   LDLCALC 43 06/12/2023   TRIG 76.0 06/12/2023   CHOLHDL 2 06/12/2023   Last hemoglobin A1c Lab Results  Component Value Date   HGBA1C 5.9 11/25/2022   Last thyroid  functions Lab Results  Component Value Date   TSH 2.94 06/12/2023    T4TOTAL 8.7 05/21/2021   Last vitamin D  Lab Results  Component Value Date   VD25OH 40.14 03/04/2022   Last vitamin B12 and Folate Lab Results  Component Value Date   VITAMINB12 446 06/12/2023      The ASCVD Risk score (Arnett DK, et al., 2019) failed to calculate for the following reasons:   The valid total cholesterol range is 130 to 320 mg/dL    Assessment & Plan:   Problem List Items Addressed This Visit   None Visit Diagnoses       Bronchitis    -  Primary   Relevant Medications   azithromycin  (ZITHROMAX  Z-PAK) 250 MG tablet   Albuterol-Budesonide (AIRSUPRA) 90-80 MCG/ACT AERO   promethazine -dextromethorphan (PROMETHAZINE -DM) 6.25-15 MG/5ML syrup   predniSONE  (DELTASONE ) 10 MG tablet   Other Relevant Orders   DG Chest 2 View (Completed)     Assessment and Plan Assessment & Plan Cough with wheezing and sinus pressure   He presents with an acute cough, wheezing, and sinus pressure, likely due to an upper respiratory infection or allergy exacerbation. Symptoms include productive cough, rhinorrhea, pharyngitis, and sinus pressure, with wheezing and chest tightness. Occasional green or dark sputum is noted. No asthma history. Symptoms have persisted since Friday, causing significant afternoon fatigue. Current use of Nyquil, Zyrtec, and Astelin  provides limited relief. Prescribe an inhaler for use as needed, two puffs up to four times a day for chest tightness. Start oral corticosteroids to reduce pulmonary inflammation. Prescribe azithromycin  (Z-Pak) for potential bacterial infection. Prescribe cough syrup for nighttime use to aid rest. Order a chest x-ray to evaluate for possible pneumonia. Advise to notify if symptoms do not improve quickly, as corticosteroids should act rapidly.  Difficulty swallowing   He reports dysphagia and is scheduled for a swallow study involving a contrast substance to evaluate swallowing function. Advise him to contact the facility conducting the  study to discuss his current cough and determine if the procedure should be postponed.    No follow-ups on file.    Jaidence Geisler R Lowne Chase, DO

## 2023-12-30 ENCOUNTER — Ambulatory Visit
Admission: RE | Admit: 2023-12-30 | Discharge: 2023-12-30 | Disposition: A | Discharge status: Home / Self Care | Source: Ambulatory Visit | Attending: Otolaryngology | Admitting: Otolaryngology

## 2023-12-30 ENCOUNTER — Other Ambulatory Visit (HOSPITAL_BASED_OUTPATIENT_CLINIC_OR_DEPARTMENT_OTHER): Payer: Self-pay

## 2023-12-30 DIAGNOSIS — R1314 Dysphagia, pharyngoesophageal phase: Secondary | ICD-10-CM

## 2023-12-30 DIAGNOSIS — R1310 Dysphagia, unspecified: Secondary | ICD-10-CM | POA: Diagnosis not present

## 2023-12-30 DIAGNOSIS — Z85818 Personal history of malignant neoplasm of other sites of lip, oral cavity, and pharynx: Secondary | ICD-10-CM

## 2023-12-30 DIAGNOSIS — Z923 Personal history of irradiation: Secondary | ICD-10-CM

## 2024-01-02 ENCOUNTER — Other Ambulatory Visit (HOSPITAL_COMMUNITY): Payer: Self-pay

## 2024-01-02 ENCOUNTER — Other Ambulatory Visit: Payer: Self-pay | Admitting: Family Medicine

## 2024-01-02 ENCOUNTER — Other Ambulatory Visit: Payer: Self-pay

## 2024-01-02 MED ORDER — PILOCARPINE HCL 5 MG PO TABS
5.0000 mg | ORAL_TABLET | Freq: Three times a day (TID) | ORAL | 2 refills | Status: DC
  Filled 2024-01-02: qty 90, 30d supply, fill #0
  Filled 2024-02-01: qty 90, 30d supply, fill #1
  Filled 2024-03-02: qty 90, 30d supply, fill #2

## 2024-01-06 ENCOUNTER — Other Ambulatory Visit (HOSPITAL_BASED_OUTPATIENT_CLINIC_OR_DEPARTMENT_OTHER): Payer: Self-pay

## 2024-01-06 ENCOUNTER — Other Ambulatory Visit (HOSPITAL_COMMUNITY): Payer: Self-pay

## 2024-01-09 ENCOUNTER — Other Ambulatory Visit: Payer: Self-pay

## 2024-01-09 ENCOUNTER — Other Ambulatory Visit (HOSPITAL_COMMUNITY): Payer: Self-pay

## 2024-01-11 ENCOUNTER — Other Ambulatory Visit: Payer: Self-pay | Admitting: Family Medicine

## 2024-01-11 DIAGNOSIS — E782 Mixed hyperlipidemia: Secondary | ICD-10-CM

## 2024-01-11 DIAGNOSIS — Z0000 Encounter for general adult medical examination without abnormal findings: Secondary | ICD-10-CM

## 2024-01-12 ENCOUNTER — Other Ambulatory Visit: Payer: Self-pay

## 2024-01-12 ENCOUNTER — Other Ambulatory Visit (HOSPITAL_COMMUNITY): Payer: Self-pay

## 2024-01-12 MED ORDER — ROSUVASTATIN CALCIUM 10 MG PO TABS
10.0000 mg | ORAL_TABLET | Freq: Every day | ORAL | 1 refills | Status: DC
  Filled 2024-01-12: qty 90, 90d supply, fill #0
  Filled 2024-02-15 – 2024-04-14 (×3): qty 90, 90d supply, fill #1

## 2024-01-19 DIAGNOSIS — M9902 Segmental and somatic dysfunction of thoracic region: Secondary | ICD-10-CM | POA: Diagnosis not present

## 2024-01-19 DIAGNOSIS — M9901 Segmental and somatic dysfunction of cervical region: Secondary | ICD-10-CM | POA: Diagnosis not present

## 2024-01-19 DIAGNOSIS — M531 Cervicobrachial syndrome: Secondary | ICD-10-CM | POA: Diagnosis not present

## 2024-01-19 DIAGNOSIS — M5031 Other cervical disc degeneration,  high cervical region: Secondary | ICD-10-CM | POA: Diagnosis not present

## 2024-02-01 ENCOUNTER — Other Ambulatory Visit: Payer: Self-pay | Admitting: Family Medicine

## 2024-02-01 DIAGNOSIS — R1313 Dysphagia, pharyngeal phase: Secondary | ICD-10-CM

## 2024-02-02 ENCOUNTER — Other Ambulatory Visit: Payer: Self-pay

## 2024-02-02 ENCOUNTER — Other Ambulatory Visit (HOSPITAL_COMMUNITY): Payer: Self-pay

## 2024-02-02 MED ORDER — PANTOPRAZOLE SODIUM 40 MG PO TBEC
40.0000 mg | DELAYED_RELEASE_TABLET | Freq: Every day | ORAL | 1 refills | Status: DC
  Filled 2024-02-02: qty 30, 30d supply, fill #0
  Filled 2024-03-02: qty 30, 30d supply, fill #1

## 2024-02-16 ENCOUNTER — Other Ambulatory Visit (HOSPITAL_COMMUNITY): Payer: Self-pay

## 2024-02-16 DIAGNOSIS — M9902 Segmental and somatic dysfunction of thoracic region: Secondary | ICD-10-CM | POA: Diagnosis not present

## 2024-02-16 DIAGNOSIS — M531 Cervicobrachial syndrome: Secondary | ICD-10-CM | POA: Diagnosis not present

## 2024-02-16 DIAGNOSIS — M5031 Other cervical disc degeneration,  high cervical region: Secondary | ICD-10-CM | POA: Diagnosis not present

## 2024-02-16 DIAGNOSIS — M9901 Segmental and somatic dysfunction of cervical region: Secondary | ICD-10-CM | POA: Diagnosis not present

## 2024-02-17 DIAGNOSIS — M1990 Unspecified osteoarthritis, unspecified site: Secondary | ICD-10-CM | POA: Insufficient documentation

## 2024-02-18 ENCOUNTER — Encounter: Payer: Self-pay | Admitting: Cardiology

## 2024-02-18 ENCOUNTER — Ambulatory Visit: Attending: Cardiology | Admitting: Cardiology

## 2024-02-18 VITALS — BP 122/78 | HR 83 | Ht 70.0 in | Wt 181.0 lb

## 2024-02-18 DIAGNOSIS — E782 Mixed hyperlipidemia: Secondary | ICD-10-CM

## 2024-02-18 DIAGNOSIS — I10 Essential (primary) hypertension: Secondary | ICD-10-CM | POA: Diagnosis not present

## 2024-02-18 DIAGNOSIS — I6529 Occlusion and stenosis of unspecified carotid artery: Secondary | ICD-10-CM | POA: Diagnosis not present

## 2024-02-18 NOTE — Progress Notes (Signed)
 Cardiology Office Note:    Date:  02/18/2024   ID:  Bobby Gonzalez, DOB 06-20-1966, MRN [id number]  PCP:  Crecencio Dodge, Candida Chalk, DO  Cardiologist:  Nelia Balzarine, MD   Referring MD: Crecencio Dodge, Candida Chalk, *    ASSESSMENT:    1. Mixed hyperlipidemia   2. Carotid atherosclerosis, unspecified laterality   3. Essential hypertension    PLAN:    In order of problems listed above:  Atherosclerotic vascular disease: Secondary prevention stressed to the patient.  Importance of compliance with diet medication stressed any vocalized understanding.  He was advised to walk at least half an hour a day on a daily basis. Essential hypertension: Blood pressure is stable and diet was emphasized.  Lifestyle modification urged. Mixed dyslipidemia: On lipid-lowering medications and will be back in the next few days for fasting blood work. Patient will be seen in follow-up appointment in 6 months or earlier if the patient has any concerns.    Medication Adjustments/Labs and Tests Ordered: Current medicines are reviewed at length with the patient today.  Concerns regarding medicines are outlined above.  Orders Placed This Encounter  Procedures   EKG 12-Lead   No orders of the defined types were placed in this encounter.    No chief complaint on file.    History of Present Illness:    Bobby Gonzalez is a 58 y.o. male.  Patient has past medical history of carotid stenting, essential hypertension and mixed dyslipidemia.  He denies any problems at this time and takes care of activities of daily living.  No chest pain orthopnea or PND.  At the time of my evaluation, the patient is alert awake oriented and in no distress.  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 radiation and chemo 05-13-12(Dr. Dossie Gayer)   Cancer of other and ill-defined sites within the digestive organs and peritoneum (HCC) 01/18/2022   Cardiac murmur 09/29/2020   Carotid atherosclerosis 03/01/2021   Carotid stenosis 09/05/2022   Chronic kidney disease    right renal mass 7.3x6.7x6.8cm renal cell ca   Dehydration 05/13/2012   Difficult intubation    POSSIBLE DIFFICULT INTUBATION--S/P RADIATION FOR CANCER LEFT TONSIL--EATING BUT THROAT STILL SORE AND PT HAS THICK MUCUS   Diverticula of colon 03/10/2012   multiple rectosigmoid colonic diverticula/ ct abd/pelvis   Dizziness 11/13/2020   Dry mouth 05/16/2014   Essential hypertension 10/11/2019   Gastro-esophageal reflux disease without esophagitis    GERD (gastroesophageal reflux disease)    Hearing loss of right ear 05/29/2020   History of cancer tonsil 09/20/2016   History of head and neck radiation 09/20/2016   History of radiation therapy 03/30/12-05/15/12   left tonsil   HTN (hypertension) 06/25/2012   at first start of chemo for squamous cell cancer of neck-left     Hyperlipidemia 05/21/2021   Hypothyroid 02/15/2013   Inguinal hernia 10/23/2012   Malignant tumor of kidney (HCC) 09/27/2020   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   Multiple lung nodules on CT 05/16/2014   Other fatigue 03/04/2022   Pain    JOINT PAINS UPPER BODY AFTER RADIATION TREATMENTS     Personal history of malignant neoplasm of unspecified site of lip,  oral cavity, and pharynx 09/27/2020   Personal history of other malignant neoplasm of kidney 09/27/2020   Phlegm in throat 05/13/2012   Pneumonia 11/26/2022   Preop cardiovascular exam 08/12/2023   Preventative health care 05/16/2014   Preventive measure 05/16/2014   Renal cell carcinoma (HCC) 09/09/2012   Renal mass 03/30/2012   Sensorineural hearing loss, bilateral 09/20/2016   Solitary kidney, acquired 09/29/2020   Solitary pulmonary nodule 09/27/2020   Syncope  09/29/2020   Tinnitus aurium, right 05/29/2020   Tonsil cancer (HCC) 02/2012   SCCa of Left tonsil-S/P biopsy on 02/17/12.   Viral upper respiratory tract infection 03/04/2022    Past Surgical History:  Procedure Laterality Date   DIRECT LARYNGOSCOPY     S/P DL, Biopsy-Dr. Donalee Fruits. Pathology postive for SCCa of Left Tonsil   GASTROSTOMY TUBE PLACEMENT  03/02/12   INGUINAL HERNIA REPAIR Left 11/04/2012   Procedure: HERNIA REPAIR INGUINAL ADULT;  Surgeon: Brandy Cal. Cornett, MD;  Location: [facility];  Service: General;  Laterality: Left;   INGUINAL HERNIA REPAIR Right 08/25/2023   Procedure: OPEN RIGHT INGUINAL HERNIA REPAIR WITH MESH;  Surgeon: Kinsinger, Alphonso Aschoff, MD;  Location: [facility];  Service: General;  Laterality: Right;  GEN OR LMA   INSERTION OF MESH Left 11/04/2012   Procedure: INSERTION OF MESH;  Surgeon: Brandy Cal. Cornett, MD;  Location: [facility];  Service: General;  Laterality: Left;   LAPAROSCOPIC NEPHRECTOMY  06/29/2012   Procedure: LAPAROSCOPIC NEPHRECTOMY;  Surgeon: Kristeen Peto, MD;  Location: [facility];  Service: Urology;  Laterality: Right;   MULTIPLE TOOTH EXTRACTIONS  03/04/12   with DR. Mohorn   PEG TUBE PLACEMENT  06-25-12   remains in place abdomen-not using at present.   PEG TUBE REMOVAL  10/2012   tonsil biopsy  02/17/12   SCCa left tomnsil,HPV positive,spread to l cervical node   TRANSCAROTID ARTERY REVASCULARIZATION  Left 09/05/2022   Procedure: Left Transcarotid Artery Revascularization;  Surgeon: Adine Hoof, MD;  Location: [facility] OR;  Service: Vascular;  Laterality: Left;   ULTRASOUND GUIDANCE FOR VASCULAR ACCESS Right 09/05/2022   Procedure: ULTRASOUND GUIDANCE FOR VASCULAR ACCESS RIGHT FEMORAL ARTERY;  Surgeon: Adine Hoof, MD;  Location: [facility] OR;  Service: Vascular;  Laterality: Right;   WISDOM TOOTH EXTRACTION      Current Medications: Current Meds  Medication Sig   Albuterol -Budesonide  (AIRSUPRA ) 90-80 MCG/ACT AERO Inhale 2 puffs into the lungs  every 6 (six) hours as needed.   aspirin  EC 81 MG tablet Take 81 mg by mouth daily.   azelastine  (ASTELIN ) 0.1 % nasal spray Place 1 spray into both nostrils 2 (two) times daily as directed   cetirizine (ZYRTEC) 10 MG tablet Take 10 mg by mouth daily.   clopidogrel  (PLAVIX ) 75 MG tablet Take 1 tablet (75 mg total) by mouth daily.   levothyroxine  (SYNTHROID ) 88 MCG tablet Take 1 tablet (88 mcg total) by mouth daily before breakfast.   Multiple Vitamin (MULTIVITAMIN) tablet Take 1 tablet by mouth daily.   oxyCODONE  (OXY IR/ROXICODONE ) 5 MG immediate release tablet Take 1 tablet (5 mg total) by mouth every 6 (six) hours as needed for severe pain (pain score 7-10).   pantoprazole  (PROTONIX ) 40 MG tablet Take 1 tablet (40 mg total) by mouth daily. (Patient taking differently: Take 40 mg by mouth as needed (heartburn).)   pilocarpine  (SALAGEN ) 5 MG tablet Take 1 tablet (5 mg total) by mouth 3 (three) times daily.   promethazine -dextromethorphan (PROMETHAZINE -DM) 6.25-15 MG/5ML syrup Take 5 mLs by  mouth 4 (four) times daily as needed.   rosuvastatin  (CRESTOR ) 10 MG tablet Take 1 tablet (10 mg total) by mouth daily.   valsartan  (DIOVAN ) 80 MG tablet Take 1 tablet (80 mg total) by mouth daily.     Allergies:   Banana and Penicillins   Social History   Socioeconomic History   Marital status: Married    Spouse name: Not on file   Number of children: 2   Years of education: Not on file   Highest education level: Associate degree: academic program  Occupational History    Employer: [employer]    Comment: Research scientist (medical)  Tobacco Use   Smoking status: Never   Smokeless tobacco: Never  Vaping Use   Vaping status: Never Used  Substance and Sexual Activity   Alcohol use: No    Comment: Never drank alcohol   Drug use: No   Sexual activity: Yes  Other Topics Concern   Not on file  Social History Narrative   The patient is married and has 2 daughters.Patient has never been a smoker.Patient  denies use of smokeless tobacco.Patient has never drank alcohol.   Right handed   Caffeine: no coffee or soda. Tea in the weekend, drinks mainly flavored water due to only having 1 kidney   Social Drivers of Corporate investment banker Strain: Not on file  Food Insecurity: Not on file  Transportation Needs: Not on file  Physical Activity: Not on file  Stress: Not on file  Social Connections: Not on file     Family History: The patient's family history includes Cancer in his father; Hypertension in his brother and father; Stroke in his father; Throat cancer in his brother; Thyroid  disease in his brother. There is no history of Colon cancer, Esophageal cancer, Rectal cancer, or Stomach cancer.  ROS:   Please see the history of present illness.    All other systems reviewed and are negative.  EKGs/Labs/Other Studies Reviewed:    The following studies were reviewed today: .Aaron AasEKG Interpretation Date/Time:  Wednesday February 18 2024 16:10:18 EDT Ventricular Rate:  83 PR Interval:  140 QRS Duration:  84 QT Interval:  360 QTC Calculation: 423 R Axis:   90  Text Interpretation: Normal sinus rhythm Rightward axis When compared with ECG of 22-Aug-2022 11:38, Nonspecific T wave abnormality no longer evident in Lateral leads Confirmed by Hillis Lu [phone]) on 02/18/2024 4:20:29 PM     Recent Labs: 06/12/2023: ALT 20; TSH 2.94 08/12/2023: BUN 17; Creatinine, Ser 1.26; Hemoglobin 16.1; Platelets 316; Potassium 4.2; Sodium 136  Recent Lipid Panel    Component Value Date/Time   CHOL 103 06/12/2023 0932   CHOL 105 10/04/2021 0844   TRIG 76.0 06/12/2023 0932   HDL 45.10 06/12/2023 0932   HDL 48 10/04/2021 0844   CHOLHDL 2 06/12/2023 0932   VLDL 15.2 06/12/2023 0932   LDLCALC 43 06/12/2023 0932   LDLCALC 45 10/04/2021 0844   LDLCALC 61 05/15/2020 0929    Physical Exam:    VS:  BP 122/78   Pulse 83   Ht 5' 10 (1.778 m)   Wt 181 lb 0.6 oz (82.1 kg)   SpO2 94%   BMI 25.98  kg/m     Wt Readings from Last 3 Encounters:  02/18/24 181 lb 0.6 oz (82.1 kg)  12/29/23 191 lb 6.4 oz (86.8 kg)  12/08/23 199 lb 9.6 oz (90.5 kg)     GEN: Patient is in no acute distress HEENT: Normal NECK:  No JVD; No carotid bruits LYMPHATICS: No lymphadenopathy CARDIAC: Hear sounds regular, 2/6 systolic murmur at the apex. RESPIRATORY:  Clear to auscultation without rales, wheezing or rhonchi  ABDOMEN: Soft, non-tender, non-distended MUSCULOSKELETAL:  No edema; No deformity  SKIN: Warm and dry NEUROLOGIC:  Alert and oriented x 3 PSYCHIATRIC:  Normal affect   Signed, Nelia Balzarine, MD  02/18/2024 4:30 PM    [facility]

## 2024-02-18 NOTE — Patient Instructions (Signed)
 Medication Instructions:  Your physician recommends that you continue on your current medications as directed. Please refer to the Current Medication list given to you today.  *If you need a refill on your cardiac medications before your next appointment, please call your pharmacy*  Lab Work: Your physician recommends that you return for lab work in:   Labs in the next few days: BMP, LFT, Lipids  If you have labs (blood work) drawn today and your tests are completely normal, you will receive your results only by: MyChart Message (if you have MyChart) OR A paper copy in the mail If you have any lab test that is abnormal or we need to change your treatment, we will call you to review the results.  Testing/Procedures: None  Follow-Up: At [facility], you and your health needs are our priority.  As part of our continuing mission to provide you with exceptional heart care, our providers are all part of one team.  This team includes your primary Cardiologist (physician) and Advanced Practice Providers or APPs (Physician Assistants and Nurse Practitioners) who all work together to provide you with the care you need, when you need it.  Your next appointment:   9 month(s)  Provider:   Hillis Lu, MD    We recommend signing up for the patient portal called MyChart.  Sign up information is provided on this After Visit Summary.  MyChart is used to connect with patients for Virtual Visits (Telemedicine).  Patients are able to view lab/test results, encounter notes, upcoming appointments, etc.  Non-urgent messages can be sent to your provider as well.   To learn more about what you can do with MyChart, go to [email or web address].   Other Instructions None

## 2024-02-19 ENCOUNTER — Other Ambulatory Visit (HOSPITAL_COMMUNITY): Payer: Self-pay

## 2024-03-01 DIAGNOSIS — I10 Essential (primary) hypertension: Secondary | ICD-10-CM | POA: Diagnosis not present

## 2024-03-01 DIAGNOSIS — I6529 Occlusion and stenosis of unspecified carotid artery: Secondary | ICD-10-CM | POA: Diagnosis not present

## 2024-03-01 DIAGNOSIS — E782 Mixed hyperlipidemia: Secondary | ICD-10-CM | POA: Diagnosis not present

## 2024-03-02 ENCOUNTER — Ambulatory Visit: Payer: Self-pay | Admitting: Cardiology

## 2024-03-02 LAB — HEPATIC FUNCTION PANEL
ALT: 31 IU/L (ref 0–44)
AST: 26 IU/L (ref 0–40)
Albumin: 4.5 g/dL (ref 3.8–4.9)
Alkaline Phosphatase: 74 IU/L (ref 44–121)
Bilirubin Total: 0.4 mg/dL (ref 0.0–1.2)
Bilirubin, Direct: 0.19 mg/dL (ref 0.00–0.40)
Total Protein: 6.4 g/dL (ref 6.0–8.5)

## 2024-03-02 LAB — LIPID PANEL
Chol/HDL Ratio: 2 ratio (ref 0.0–5.0)
Cholesterol, Total: 106 mg/dL (ref 100–199)
HDL: 52 mg/dL (ref >39)
LDL Chol Calc (NIH): 43 mg/dL (ref 0–99)
Triglycerides: 45 mg/dL (ref 0–149)
VLDL Cholesterol Cal: 11 mg/dL (ref 5–40)

## 2024-03-02 LAB — BASIC METABOLIC PANEL WITH GFR
BUN/Creatinine Ratio: 9 (ref 9–20)
BUN: 12 mg/dL (ref 6–24)
CO2: 22 mmol/L (ref 20–29)
Calcium: 9.1 mg/dL (ref 8.7–10.2)
Chloride: 105 mmol/L (ref 96–106)
Creatinine, Ser: 1.32 mg/dL — ABNORMAL HIGH (ref 0.76–1.27)
Glucose: 91 mg/dL (ref 70–99)
Potassium: 4.7 mmol/L (ref 3.5–5.2)
Sodium: 142 mmol/L (ref 134–144)
eGFR: 63 mL/min/{1.73_m2} (ref >59)

## 2024-03-15 DIAGNOSIS — M9908 Segmental and somatic dysfunction of rib cage: Secondary | ICD-10-CM | POA: Diagnosis not present

## 2024-03-15 DIAGNOSIS — M9901 Segmental and somatic dysfunction of cervical region: Secondary | ICD-10-CM | POA: Diagnosis not present

## 2024-03-15 DIAGNOSIS — G243 Spasmodic torticollis: Secondary | ICD-10-CM | POA: Diagnosis not present

## 2024-03-15 DIAGNOSIS — M6240 Contracture of muscle, unspecified site: Secondary | ICD-10-CM | POA: Diagnosis not present

## 2024-03-15 DIAGNOSIS — M9902 Segmental and somatic dysfunction of thoracic region: Secondary | ICD-10-CM | POA: Diagnosis not present

## 2024-03-19 ENCOUNTER — Other Ambulatory Visit (HOSPITAL_COMMUNITY): Payer: Self-pay

## 2024-03-31 ENCOUNTER — Other Ambulatory Visit (HOSPITAL_COMMUNITY): Payer: Self-pay

## 2024-03-31 ENCOUNTER — Other Ambulatory Visit: Payer: Self-pay | Admitting: Family Medicine

## 2024-03-31 DIAGNOSIS — E039 Hypothyroidism, unspecified: Secondary | ICD-10-CM

## 2024-03-31 MED ORDER — LEVOTHYROXINE SODIUM 88 MCG PO TABS
88.0000 ug | ORAL_TABLET | Freq: Every day | ORAL | 1 refills | Status: DC
  Filled 2024-03-31: qty 90, 90d supply, fill #0
  Filled 2024-06-23: qty 90, 90d supply, fill #1

## 2024-04-04 ENCOUNTER — Other Ambulatory Visit: Payer: Self-pay | Admitting: Family Medicine

## 2024-04-04 DIAGNOSIS — R1313 Dysphagia, pharyngeal phase: Secondary | ICD-10-CM

## 2024-04-05 ENCOUNTER — Other Ambulatory Visit (HOSPITAL_COMMUNITY): Payer: Self-pay

## 2024-04-05 MED ORDER — PANTOPRAZOLE SODIUM 40 MG PO TBEC
40.0000 mg | DELAYED_RELEASE_TABLET | Freq: Every day | ORAL | 1 refills | Status: DC
  Filled 2024-04-05: qty 30, 30d supply, fill #0
  Filled 2024-05-02: qty 30, 30d supply, fill #1

## 2024-04-06 ENCOUNTER — Ambulatory Visit: Admitting: Family Medicine

## 2024-04-06 ENCOUNTER — Encounter: Payer: Self-pay | Admitting: Family Medicine

## 2024-04-06 VITALS — BP 112/82 | HR 56 | Temp 98.1°F | Resp 16 | Ht 70.0 in | Wt 190.2 lb

## 2024-04-06 DIAGNOSIS — Z1211 Encounter for screening for malignant neoplasm of colon: Secondary | ICD-10-CM

## 2024-04-06 DIAGNOSIS — R739 Hyperglycemia, unspecified: Secondary | ICD-10-CM

## 2024-04-06 DIAGNOSIS — E782 Mixed hyperlipidemia: Secondary | ICD-10-CM

## 2024-04-06 DIAGNOSIS — E538 Deficiency of other specified B group vitamins: Secondary | ICD-10-CM | POA: Diagnosis not present

## 2024-04-06 DIAGNOSIS — I10 Essential (primary) hypertension: Secondary | ICD-10-CM | POA: Diagnosis not present

## 2024-04-06 DIAGNOSIS — N183 Chronic kidney disease, stage 3 unspecified: Secondary | ICD-10-CM | POA: Diagnosis not present

## 2024-04-06 DIAGNOSIS — E039 Hypothyroidism, unspecified: Secondary | ICD-10-CM | POA: Diagnosis not present

## 2024-04-06 DIAGNOSIS — I6529 Occlusion and stenosis of unspecified carotid artery: Secondary | ICD-10-CM

## 2024-04-06 DIAGNOSIS — C641 Malignant neoplasm of right kidney, except renal pelvis: Secondary | ICD-10-CM | POA: Diagnosis not present

## 2024-04-06 LAB — COMPREHENSIVE METABOLIC PANEL WITH GFR
ALT: 20 U/L (ref 0–53)
AST: 19 U/L (ref 0–37)
Albumin: 4.3 g/dL (ref 3.5–5.2)
Alkaline Phosphatase: 61 U/L (ref 39–117)
BUN: 17 mg/dL (ref 6–23)
CO2: 28 meq/L (ref 19–32)
Calcium: 8.9 mg/dL (ref 8.4–10.5)
Chloride: 107 meq/L (ref 96–112)
Creatinine, Ser: 1.18 mg/dL (ref 0.40–1.50)
GFR: 68.49 mL/min (ref >60.00)
Glucose, Bld: 100 mg/dL — ABNORMAL HIGH (ref 70–99)
Potassium: 5.2 meq/L — ABNORMAL HIGH (ref 3.5–5.1)
Sodium: 139 meq/L (ref 135–145)
Total Bilirubin: 0.5 mg/dL (ref 0.2–1.2)
Total Protein: 6.4 g/dL (ref 6.0–8.3)

## 2024-04-06 LAB — VITAMIN B12: Vitamin B-12: 552 pg/mL (ref 211–911)

## 2024-04-06 LAB — TSH: TSH: 1.96 u[IU]/mL (ref 0.35–5.50)

## 2024-04-06 LAB — HEMOGLOBIN A1C: Hgb A1c MFr Bld: 6.3 % (ref 4.6–6.5)

## 2024-04-06 NOTE — Assessment & Plan Note (Addendum)
 Lab Results  Component Value Date   CHOL 106 03/01/2024   HDL 52 03/01/2024   LDLCALC 43 03/01/2024   TRIG 45 03/01/2024   CHOLHDL 2.0 03/01/2024   Encourage heart healthy diet such as MIND or DASH diet, increase exercise, avoid trans fats, simple carbohydrates and processed foods, consider a krill or fish or flaxseed oil cap daily.

## 2024-04-06 NOTE — Assessment & Plan Note (Signed)
 Check labs today.

## 2024-04-06 NOTE — Assessment & Plan Note (Signed)
Check cmp today 

## 2024-04-06 NOTE — Assessment & Plan Note (Signed)
 Well controlled, no changes to meds. Encouraged heart healthy diet such as the DASH diet and exercise as tolerated.

## 2024-04-06 NOTE — Patient Instructions (Signed)

## 2024-04-06 NOTE — Progress Notes (Signed)
 Subjective:    Patient ID: Bobby Gonzalez, male    DOB: 11-08-1965, 58 y.o.   MRN: [id number]  Chief Complaint  Patient presents with   Cardiology Follow up    HPI Patient is in today for f/u.  Discussed the use of AI scribe software for clinical note transcription with the patient, who gave verbal consent to proceed.  History of Present Illness Bobby Gonzalez is a 58 year old male with kidney dysfunction who presents for a follow-up on his kidney function and overall health.  He has been experiencing fatigue, particularly in the afternoons, due to the hot weather. He starts work early to avoid the heat and often feels exhausted by 8 PM. He reports having no energy after being in the sun all day.  He has been trying to stay hydrated and has started drinking electrolyte drinks like Powerade with zero sugar. Despite his active work routine, he finds it difficult to lose weight. He often skips meals due to the heat, which may affect his calorie intake. He consumes cherries, believing they might help with bone aches experienced during excessive sweating.  His blood pressure and cholesterol levels were reported as good by his cardiologist. He is currently taking Synthroid , last refilled on July 30th, and pantoprazole . He reports that he is using his inhalers as prescribed.  He is due for a colonoscopy, last performed in 2019. He cannot recall if polyps were found during the last procedure.  He denies any symptoms of depression.    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 radiation and chemo 05-13-12(Dr. Twana)   Cancer of other and ill-defined sites within the digestive organs and peritoneum (HCC) 01/18/2022   Cardiac  murmur 09/29/2020   Carotid atherosclerosis 03/01/2021   Carotid stenosis 09/05/2022   Chronic kidney disease    right renal mass 7.3x6.7x6.8cm renal cell ca   Dehydration 05/13/2012   Difficult intubation    POSSIBLE DIFFICULT INTUBATION--S/P RADIATION FOR CANCER LEFT TONSIL--EATING BUT THROAT STILL SORE AND PT HAS THICK MUCUS   Diverticula of colon 03/10/2012   multiple rectosigmoid colonic diverticula/ ct abd/pelvis   Dizziness 11/13/2020   Dry mouth 05/16/2014   Essential hypertension 10/11/2019   Gastro-esophageal reflux disease without esophagitis    GERD (gastroesophageal reflux disease)    Hearing loss of right ear 05/29/2020   History of cancer tonsil 09/20/2016   History of head and neck radiation 09/20/2016   History of radiation therapy 03/30/12-05/15/12   left tonsil   HTN (hypertension) 06/25/2012   at first start of chemo for squamous cell cancer of neck-left     Hyperlipidemia 05/21/2021   Hypothyroid 02/15/2013   Inguinal hernia 10/23/2012   Malignant tumor of kidney (HCC) 09/27/2020   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   Multiple lung nodules on CT 05/16/2014   Other fatigue 03/04/2022   Pain    JOINT PAINS UPPER BODY AFTER RADIATION TREATMENTS     Personal history of malignant neoplasm of unspecified site of lip, oral cavity, and pharynx 09/27/2020   Personal history of other malignant neoplasm of kidney 09/27/2020   Phlegm in throat 05/13/2012   Pneumonia 11/26/2022   Preop cardiovascular exam 08/12/2023  Preventative health care 05/16/2014   Preventive measure 05/16/2014   Renal cell carcinoma (HCC) 09/09/2012   Renal mass 03/30/2012   Sensorineural hearing loss, bilateral 09/20/2016   Solitary kidney, acquired 09/29/2020   Solitary pulmonary nodule 09/27/2020   Syncope 09/29/2020   Tinnitus aurium, right 05/29/2020   Tonsil cancer (HCC) 02/2012   SCCa of Left tonsil-S/P biopsy on 02/17/12.   Viral upper respiratory tract infection 03/04/2022     Past Surgical History:  Procedure Laterality Date   DIRECT LARYNGOSCOPY     S/P DL, Biopsy-Dr. Jesus. Pathology postive for SCCa of Left Tonsil   GASTROSTOMY TUBE PLACEMENT  03/02/12   INGUINAL HERNIA REPAIR Left 11/04/2012   Procedure: HERNIA REPAIR INGUINAL ADULT;  Surgeon: Debby LABOR. Cornett, MD;  Location: [facility];  Service: General;  Laterality: Left;   INGUINAL HERNIA REPAIR Right 08/25/2023   Procedure: OPEN RIGHT INGUINAL HERNIA REPAIR WITH MESH;  Surgeon: Kinsinger, Herlene Righter, MD;  Location: [facility];  Service: General;  Laterality: Right;  GEN OR LMA   INSERTION OF MESH Left 11/04/2012   Procedure: INSERTION OF MESH;  Surgeon: Debby LABOR. Cornett, MD;  Location: [facility];  Service: General;  Laterality: Left;   LAPAROSCOPIC NEPHRECTOMY  06/29/2012   Procedure: LAPAROSCOPIC NEPHRECTOMY;  Surgeon: Noretta Ferrara, MD;  Location: [facility];  Service: Urology;  Laterality: Right;   MULTIPLE TOOTH EXTRACTIONS  03/04/12   with DR. Mohorn   PEG TUBE PLACEMENT  06-25-12   remains in place abdomen-not using at present.   PEG TUBE REMOVAL  10/2012   tonsil biopsy  02/17/12   SCCa left tomnsil,HPV positive,spread to l cervical node   TRANSCAROTID ARTERY REVASCULARIZATION  Left 09/05/2022   Procedure: Left Transcarotid Artery Revascularization;  Surgeon: Sheree Penne Bruckner, MD;  Location: [facility] OR;  Service: Vascular;  Laterality: Left;   ULTRASOUND GUIDANCE FOR VASCULAR ACCESS Right 09/05/2022   Procedure: ULTRASOUND GUIDANCE FOR VASCULAR ACCESS RIGHT FEMORAL ARTERY;  Surgeon: Sheree Penne Bruckner, MD;  Location: [facility] OR;  Service: Vascular;  Laterality: Right;   WISDOM TOOTH EXTRACTION      Family History  Problem Relation Age of Onset   Stroke Father    Cancer Father        Prostate   Hypertension Father    Thyroid  disease Brother    Hypertension Brother    Throat cancer Brother    Colon cancer Neg Hx    Esophageal cancer Neg Hx    Rectal cancer Neg Hx    Stomach cancer Neg Hx     Social  History   Socioeconomic History   Marital status: Married    Spouse name: Not on file   Number of children: 2   Years of education: Not on file   Highest education level: Associate degree: academic program  Occupational History    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   Right handed   Caffeine: no coffee or soda. Tea in the weekend, drinks mainly  flavored water due to only having 1 kidney   Social Drivers of Corporate investment banker Strain: Not on file  Food Insecurity: Not on file  Transportation Needs: Not on file  Physical Activity: Not on file  Stress: Not on file  Social Connections: Not on file  Intimate Partner Violence: Not on file    Outpatient Medications Prior to Visit  Medication Sig Dispense Refill   Albuterol -Budesonide  (AIRSUPRA ) 90-80 MCG/ACT AERO Inhale 2 puffs into the lungs every 6 (six) hours as needed. 10.7 g 5   aspirin  EC 81 MG tablet Take 81 mg by mouth daily.     azelastine  (ASTELIN ) 0.1 % nasal spray Place 1 spray into both nostrils 2 (two) times daily as directed 30 mL 12   cetirizine (ZYRTEC) 10 MG tablet Take 10 mg by mouth daily.     clopidogrel  (PLAVIX ) 75 MG tablet Take 1 tablet (75 mg total) by mouth daily. 30 tablet 11   levothyroxine  (SYNTHROID ) 88 MCG tablet Take 1 tablet (88 mcg total) by mouth daily before breakfast. 90 tablet 1   Multiple Vitamin (MULTIVITAMIN) tablet Take 1 tablet by mouth daily.     oxyCODONE  (OXY IR/ROXICODONE ) 5 MG immediate release tablet Take 1 tablet (5 mg  total) by mouth every 6 (six) hours as needed for severe pain (pain score 7-10). 15 tablet 0   pantoprazole  (PROTONIX ) 40 MG tablet Take 1 tablet (40 mg total) by mouth daily. 30 tablet 1   pilocarpine  (SALAGEN ) 5 MG tablet Take 1 tablet (5 mg total) by mouth 3 (three) times daily. 90 tablet 2   rosuvastatin  (CRESTOR ) 10 MG tablet Take 1 tablet (10 mg total) by mouth daily. 90 tablet 1   valsartan  (DIOVAN ) 80 MG tablet Take 1 tablet (80 mg total) by mouth daily. 90 tablet 3   promethazine -dextromethorphan (PROMETHAZINE -DM) 6.25-15 MG/5ML syrup Take 5 mLs by mouth 4 (four) times daily as needed. (Patient not taking: Reported on 04/06/2024) 118 mL 0   No facility-administered medications prior to visit.    Allergies  Allergen Reactions   Banana Shortness Of Breath, Rash and Other (See Comments)    Sneezing    Penicillins Other (See Comments)    Unknown per Pt     Review of Systems  Constitutional:  Negative for chills, fever and malaise/fatigue.  HENT:  Negative for congestion and hearing loss.   Eyes:  Negative for discharge.  Respiratory:  Negative for cough, sputum production and shortness of breath.   Cardiovascular:  Negative for chest pain, palpitations and leg swelling.  Gastrointestinal:  Negative for abdominal pain, blood in stool, constipation, diarrhea, heartburn, nausea and vomiting.  Genitourinary:  Negative for dysuria, frequency, hematuria and urgency.  Musculoskeletal:  Negative for back pain, falls and myalgias.  Skin:  Negative for rash.  Neurological:  Negative for dizziness, sensory change, loss of consciousness, weakness and headaches.  Endo/Heme/Allergies:  Negative for environmental allergies. Does not bruise/bleed easily.  Psychiatric/Behavioral:  Negative for depression and suicidal ideas. The patient is not nervous/anxious and does not have insomnia.        Objective:    Physical Exam Vitals and nursing note reviewed.  Constitutional:      General: He is  not in acute distress.    Appearance: Normal appearance. He is well-developed.  HENT:     Head: Normocephalic and atraumatic.  Eyes:     General: No scleral icterus.       Right eye: No discharge.  Left eye: No discharge.  Cardiovascular:     Rate and Rhythm: Normal rate and regular rhythm.     Heart sounds: No murmur heard. Pulmonary:     Effort: Pulmonary effort is normal. No respiratory distress.     Breath sounds: Normal breath sounds.  Musculoskeletal:        General: Normal range of motion.     Cervical back: Normal range of motion and neck supple.     Right lower leg: No edema.     Left lower leg: No edema.  Skin:    General: Skin is warm and dry.  Neurological:     Mental Status: He is alert and oriented to person, place, and time.  Psychiatric:        Mood and Affect: Mood normal.        Behavior: Behavior normal.        Thought Content: Thought content normal.        Judgment: Judgment normal.     BP 112/82 (BP Location: Right Arm, Patient Position: Sitting, Cuff Size: Normal)   Pulse (!) 56   Temp 98.1 F (36.7 C) (Oral)   Resp 16   Ht 5' 10 (1.778 m)   Wt 190 lb 3.2 oz (86.3 kg)   SpO2 97%   BMI 27.29 kg/m  Wt Readings from Last 3 Encounters:  04/06/24 190 lb 3.2 oz (86.3 kg)  02/18/24 181 lb 0.6 oz (82.1 kg)  12/29/23 191 lb 6.4 oz (86.8 kg)    Diabetic Foot Exam - Simple   No data filed    Lab Results  Component Value Date   WBC 7.1 08/12/2023   HGB 16.1 08/12/2023   HCT 48.5 08/12/2023   PLT 316 08/12/2023   GLUCOSE 91 03/01/2024   CHOL 106 03/01/2024   TRIG 45 03/01/2024   HDL 52 03/01/2024   LDLCALC 43 03/01/2024   ALT 31 03/01/2024   AST 26 03/01/2024   NA 142 03/01/2024   K 4.7 03/01/2024   CL 105 03/01/2024   CREATININE 1.32 (H) 03/01/2024   BUN 12 03/01/2024   CO2 22 03/01/2024   TSH 2.94 06/12/2023   PSA 0.21 11/25/2022   INR 1.0 08/22/2022   HGBA1C 5.9 11/25/2022    Lab Results  Component Value Date   TSH 2.94  06/12/2023   Lab Results  Component Value Date   WBC 7.1 08/12/2023   HGB 16.1 08/12/2023   HCT 48.5 08/12/2023   MCV 93.1 08/12/2023   PLT 316 08/12/2023   Lab Results  Component Value Date   NA 142 03/01/2024   K 4.7 03/01/2024   CHLORIDE 106 02/10/2017   CO2 22 03/01/2024   GLUCOSE 91 03/01/2024   BUN 12 03/01/2024   CREATININE 1.32 (H) 03/01/2024   BILITOT 0.4 03/01/2024   ALKPHOS 74 03/01/2024   AST 26 03/01/2024   ALT 31 03/01/2024   PROT 6.4 03/01/2024   ALBUMIN 4.5 03/01/2024   CALCIUM  9.1 03/01/2024   ANIONGAP 8 08/12/2023   EGFR 63 03/01/2024   GFR 75.77 06/12/2023   Lab Results  Component Value Date   CHOL 106 03/01/2024   Lab Results  Component Value Date   HDL 52 03/01/2024   Lab Results  Component Value Date   LDLCALC 43 03/01/2024   Lab Results  Component Value Date   TRIG 45 03/01/2024   Lab Results  Component Value Date   CHOLHDL 2.0 03/01/2024   Lab Results  Component Value  Date   HGBA1C 5.9 11/25/2022       Assessment & Plan:  Hypothyroidism, unspecified type Assessment & Plan: Check labs today  Orders: -     TSH  B12 deficiency -     Vitamin B12  Hyperglycemia -     Comprehensive metabolic panel with GFR -     Hemoglobin A1c  Primary hypertension -     Comprehensive metabolic panel with GFR  Stage 3 chronic kidney disease, unspecified whether stage 3a or 3b CKD (HCC) -     Comprehensive metabolic panel with GFR  Carotid atherosclerosis, unspecified laterality -     Comprehensive metabolic panel with GFR  Colon cancer screening -     Ambulatory referral to Gastroenterology  Renal cell carcinoma of right kidney [facility]) Assessment & Plan: Check cmp today    Mixed hyperlipidemia Assessment & Plan: Lab Results  Component Value Date   CHOL 106 03/01/2024   HDL 52 03/01/2024   LDLCALC 43 03/01/2024   TRIG 45 03/01/2024   CHOLHDL 2.0 03/01/2024   Encourage heart healthy diet such as MIND or DASH diet, increase  exercise, avoid trans fats, simple carbohydrates and processed foods, consider a krill or fish or flaxseed oil cap daily.     Essential hypertension Assessment & Plan: Well controlled, no changes to meds. Encouraged heart healthy diet such as the DASH diet and exercise as tolerated.     Assessment and Plan Assessment & Plan Chronic kidney disease   Chronic kidney disease with electrolyte imbalance. He has been advised to increase fluid intake with electrolytes, as water alone was deemed insufficient. He has been following this advice by consuming Powerade with zero sugar. Recheck kidney function.  Hypothyroidism   Hypothyroidism is being managed with Synthroid , which was recently refilled on July 30th.  Gastroesophageal reflux disease   Gastroesophageal reflux disease is being managed with pantoprazole , which is ready for pickup.    Zebulan Hinshaw R Lowne Chase, DO

## 2024-04-11 ENCOUNTER — Ambulatory Visit: Payer: Self-pay | Admitting: Family Medicine

## 2024-04-29 ENCOUNTER — Other Ambulatory Visit (HOSPITAL_COMMUNITY): Payer: Self-pay

## 2024-04-29 ENCOUNTER — Other Ambulatory Visit: Payer: Self-pay | Admitting: Family Medicine

## 2024-04-29 ENCOUNTER — Other Ambulatory Visit: Payer: Self-pay

## 2024-04-29 DIAGNOSIS — N183 Chronic kidney disease, stage 3 unspecified: Secondary | ICD-10-CM | POA: Diagnosis not present

## 2024-04-29 DIAGNOSIS — N2581 Secondary hyperparathyroidism of renal origin: Secondary | ICD-10-CM | POA: Diagnosis not present

## 2024-04-29 DIAGNOSIS — C641 Malignant neoplasm of right kidney, except renal pelvis: Secondary | ICD-10-CM | POA: Diagnosis not present

## 2024-04-29 DIAGNOSIS — I129 Hypertensive chronic kidney disease with stage 1 through stage 4 chronic kidney disease, or unspecified chronic kidney disease: Secondary | ICD-10-CM | POA: Diagnosis not present

## 2024-04-29 MED ORDER — PILOCARPINE HCL 5 MG PO TABS
5.0000 mg | ORAL_TABLET | Freq: Three times a day (TID) | ORAL | 1 refills | Status: AC
  Filled 2024-04-29: qty 270, 90d supply, fill #0
  Filled 2024-08-29: qty 270, 90d supply, fill #1

## 2024-05-21 ENCOUNTER — Other Ambulatory Visit: Payer: Self-pay

## 2024-06-06 ENCOUNTER — Other Ambulatory Visit: Payer: Self-pay | Admitting: Family Medicine

## 2024-06-06 DIAGNOSIS — R1313 Dysphagia, pharyngeal phase: Secondary | ICD-10-CM

## 2024-06-07 ENCOUNTER — Other Ambulatory Visit (HOSPITAL_COMMUNITY): Payer: Self-pay

## 2024-06-07 ENCOUNTER — Other Ambulatory Visit: Payer: Self-pay

## 2024-06-07 MED ORDER — PANTOPRAZOLE SODIUM 40 MG PO TBEC
40.0000 mg | DELAYED_RELEASE_TABLET | Freq: Every day | ORAL | 1 refills | Status: AC
  Filled 2024-06-07: qty 90, 90d supply, fill #0
  Filled 2024-08-29: qty 90, 90d supply, fill #1

## 2024-06-23 ENCOUNTER — Other Ambulatory Visit: Payer: Self-pay | Admitting: Family Medicine

## 2024-06-23 ENCOUNTER — Other Ambulatory Visit: Payer: Self-pay

## 2024-06-23 ENCOUNTER — Other Ambulatory Visit (HOSPITAL_COMMUNITY): Payer: Self-pay

## 2024-06-23 DIAGNOSIS — J069 Acute upper respiratory infection, unspecified: Secondary | ICD-10-CM

## 2024-06-23 MED ORDER — AZELASTINE HCL 0.1 % NA SOLN
1.0000 | Freq: Two times a day (BID) | NASAL | 5 refills | Status: AC
  Filled 2024-06-23: qty 30, 90d supply, fill #0

## 2024-07-19 ENCOUNTER — Other Ambulatory Visit: Payer: Self-pay

## 2024-07-19 ENCOUNTER — Other Ambulatory Visit (HOSPITAL_COMMUNITY): Payer: Self-pay

## 2024-07-19 ENCOUNTER — Other Ambulatory Visit: Payer: Self-pay | Admitting: Family Medicine

## 2024-07-19 DIAGNOSIS — Z0000 Encounter for general adult medical examination without abnormal findings: Secondary | ICD-10-CM

## 2024-07-19 DIAGNOSIS — E782 Mixed hyperlipidemia: Secondary | ICD-10-CM

## 2024-07-19 MED ORDER — ROSUVASTATIN CALCIUM 10 MG PO TABS
10.0000 mg | ORAL_TABLET | Freq: Every day | ORAL | 1 refills | Status: AC
  Filled 2024-07-19: qty 90, 90d supply, fill #0

## 2024-07-23 ENCOUNTER — Other Ambulatory Visit (HOSPITAL_COMMUNITY): Payer: Self-pay

## 2024-08-16 ENCOUNTER — Other Ambulatory Visit (HOSPITAL_COMMUNITY): Payer: Self-pay

## 2024-08-17 ENCOUNTER — Other Ambulatory Visit (HOSPITAL_COMMUNITY): Payer: Self-pay

## 2024-08-20 ENCOUNTER — Encounter: Payer: Self-pay | Admitting: Family Medicine

## 2024-08-20 ENCOUNTER — Other Ambulatory Visit (HOSPITAL_BASED_OUTPATIENT_CLINIC_OR_DEPARTMENT_OTHER): Payer: Self-pay

## 2024-08-20 ENCOUNTER — Ambulatory Visit: Admitting: Family Medicine

## 2024-08-20 VITALS — BP 120/84 | HR 88 | Temp 98.0°F | Resp 16 | Ht 70.0 in | Wt 193.4 lb

## 2024-08-20 DIAGNOSIS — J0100 Acute maxillary sinusitis, unspecified: Secondary | ICD-10-CM | POA: Diagnosis not present

## 2024-08-20 MED ORDER — DOXYCYCLINE HYCLATE 100 MG PO TABS
100.0000 mg | ORAL_TABLET | Freq: Two times a day (BID) | ORAL | 0 refills | Status: DC
  Filled 2024-08-20: qty 14, 7d supply, fill #0

## 2024-08-20 MED ORDER — METHYLPREDNISOLONE ACETATE 80 MG/ML IJ SUSP
80.0000 mg | Freq: Once | INTRAMUSCULAR | Status: AC
  Administered 2024-08-20: 80 mg via INTRAMUSCULAR

## 2024-08-20 NOTE — Patient Instructions (Addendum)
 Continue to push fluids, practice good hand hygiene, and cover your mouth if you cough.  If you start having fevers, shaking or shortness of breath, seek immediate care.  If still worsening by tomorrow, take the doxycycline .   Let us  know if you need anything.

## 2024-08-20 NOTE — Progress Notes (Signed)
 Chief Complaint  Patient presents with   Cough    Cough     Bobby Gonzalez here for URI complaints.  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 radiation and chemo 05-13-12(Dr. Twana)   Cancer of other and ill-defined sites within the digestive organs and peritoneum (HCC) 01/18/2022   Cardiac murmur 09/29/2020   Carotid atherosclerosis 03/01/2021   Carotid stenosis 09/05/2022   Chronic kidney disease    right renal mass 7.3x6.7x6.8cm renal cell ca   Dehydration 05/13/2012   Difficult intubation    POSSIBLE DIFFICULT INTUBATION--S/P RADIATION FOR CANCER LEFT TONSIL--EATING BUT THROAT STILL SORE AND PT HAS THICK MUCUS   Diverticula of colon 03/10/2012   multiple rectosigmoid colonic diverticula/ ct abd/pelvis   Dizziness 11/13/2020   Dry mouth 05/16/2014   Essential hypertension 10/11/2019   Gastro-esophageal reflux disease without esophagitis    GERD (gastroesophageal reflux disease)    Hearing loss of right ear 05/29/2020   History of cancer tonsil 09/20/2016   History of head and neck radiation 09/20/2016   History of radiation therapy 03/30/12-05/15/12   left tonsil   HTN (hypertension) 06/25/2012   at first start of chemo for squamous cell cancer of neck-left     Hyperlipidemia 05/21/2021   Hypothyroid 02/15/2013   Inguinal hernia 10/23/2012   Malignant tumor of kidney (HCC) 09/27/2020   Malignant tumor of  tonsil (HCC) 09/27/2020   Multiple lung nodules on CT 05/16/2014   Other fatigue 03/04/2022   Pain    JOINT PAINS UPPER BODY AFTER RADIATION TREATMENTS     Personal history of malignant neoplasm of unspecified site of lip, oral cavity, and pharynx 09/27/2020   Personal history of other malignant neoplasm of kidney 09/27/2020   Phlegm in throat 05/13/2012   Pneumonia 11/26/2022   Preop cardiovascular exam 08/12/2023   Preventative health care 05/16/2014   Preventive measure 05/16/2014   Renal cell carcinoma (HCC) 09/09/2012   Renal mass 03/30/2012   Sensorineural hearing loss, bilateral 09/20/2016   Solitary kidney, acquired 09/29/2020   Solitary pulmonary nodule 09/27/2020   Syncope 09/29/2020   Tinnitus aurium, right 05/29/2020   Tonsil cancer (HCC) 02/2012   SCCa of Left tonsil-S/P biopsy on 02/17/12.   Viral upper respiratory tract infection 03/04/2022    Objective BP 120/84 (BP Location: Left Arm, Patient Position: Sitting)   Pulse 88   Temp 98 F (36.7 C) (Oral)   Resp 16   Ht 5' 10 (1.778 m)   Wt 193 lb 6.4 oz (87.7 kg)   SpO2 96%   BMI 27.75 kg/m  General: Awake, alert, appears stated age HEENT: AT, [place], ears patent b/l and TM's neg, nares patent w/o discharge, pharynx pink and without exudates, MMM, mild sinus TTP bilaterally Neck: No masses or asymmetry Heart: RRR Lungs: CTAB, no accessory muscle use Psych: Age appropriate judgment and insight, normal mood and affect  Acute maxillary sinusitis, recurrence not specified - Plan: methylPREDNISolone  acetate (  DEPO-MEDROL ) injection 80 mg  Depo-Medrol  IM 80 mg today.  If no improvement/worsening by tomorrow, he will take a 7-day course of doxycycline  100 mg twice daily given penicillin allergy.  Continue to push fluids, practice good hand hygiene, cover mouth when coughing. F/u prn. If starting to experience fevers, shaking, or shortness of breath, seek immediate care. Pt voiced understanding and agreement to the  plan.  Mabel [place], DO 08/20/2024 4:23 PM

## 2024-08-24 ENCOUNTER — Other Ambulatory Visit (HOSPITAL_COMMUNITY): Payer: Self-pay

## 2024-08-24 ENCOUNTER — Other Ambulatory Visit: Payer: Self-pay

## 2024-08-24 ENCOUNTER — Encounter: Payer: Self-pay | Admitting: Family Medicine

## 2024-08-24 ENCOUNTER — Other Ambulatory Visit: Payer: Self-pay | Admitting: Family Medicine

## 2024-08-24 MED ORDER — OSELTAMIVIR PHOSPHATE 75 MG PO CAPS
75.0000 mg | ORAL_CAPSULE | Freq: Two times a day (BID) | ORAL | 0 refills | Status: AC
  Filled 2024-08-24 – 2024-08-25 (×2): qty 10, 5d supply, fill #0

## 2024-08-25 ENCOUNTER — Other Ambulatory Visit (HOSPITAL_COMMUNITY): Payer: Self-pay

## 2024-08-29 ENCOUNTER — Other Ambulatory Visit: Payer: Self-pay | Admitting: Family Medicine

## 2024-08-29 ENCOUNTER — Other Ambulatory Visit (HOSPITAL_COMMUNITY): Payer: Self-pay

## 2024-08-30 ENCOUNTER — Other Ambulatory Visit (HOSPITAL_COMMUNITY): Payer: Self-pay

## 2024-08-30 ENCOUNTER — Other Ambulatory Visit: Payer: Self-pay

## 2024-08-30 MED ORDER — VALSARTAN 80 MG PO TABS
80.0000 mg | ORAL_TABLET | Freq: Every day | ORAL | 1 refills | Status: AC
  Filled 2024-08-30 – 2024-09-06 (×3): qty 90, 90d supply, fill #0

## 2024-08-31 ENCOUNTER — Other Ambulatory Visit: Payer: Self-pay

## 2024-08-31 ENCOUNTER — Encounter: Payer: Self-pay | Admitting: Pharmacist

## 2024-09-03 ENCOUNTER — Other Ambulatory Visit (HOSPITAL_COMMUNITY): Payer: Self-pay

## 2024-09-06 ENCOUNTER — Other Ambulatory Visit (HOSPITAL_COMMUNITY): Payer: Self-pay

## 2024-09-06 ENCOUNTER — Other Ambulatory Visit: Payer: Self-pay

## 2024-09-13 ENCOUNTER — Other Ambulatory Visit (HOSPITAL_COMMUNITY): Payer: Self-pay

## 2024-09-13 ENCOUNTER — Other Ambulatory Visit: Payer: Self-pay

## 2024-09-13 ENCOUNTER — Other Ambulatory Visit: Payer: Self-pay | Admitting: Vascular Surgery

## 2024-09-13 MED ORDER — CLOPIDOGREL BISULFATE 75 MG PO TABS
75.0000 mg | ORAL_TABLET | Freq: Every day | ORAL | 2 refills | Status: AC
  Filled 2024-09-13: qty 30, 30d supply, fill #0

## 2024-09-13 NOTE — Telephone Encounter (Signed)
 Patient needs appointment with provider or future refills need to be by PCP

## 2024-09-17 ENCOUNTER — Ambulatory Visit: Admitting: Family Medicine

## 2024-10-05 ENCOUNTER — Other Ambulatory Visit (HOSPITAL_COMMUNITY): Payer: Self-pay

## 2024-10-05 ENCOUNTER — Other Ambulatory Visit: Payer: Self-pay | Admitting: Family Medicine

## 2024-10-05 DIAGNOSIS — E039 Hypothyroidism, unspecified: Secondary | ICD-10-CM

## 2024-10-05 MED ORDER — LEVOTHYROXINE SODIUM 88 MCG PO TABS
88.0000 ug | ORAL_TABLET | Freq: Every day | ORAL | 0 refills | Status: AC
  Filled 2024-10-05: qty 90, 90d supply, fill #0

## 2024-10-06 ENCOUNTER — Other Ambulatory Visit (HOSPITAL_COMMUNITY): Payer: Self-pay

## 2024-10-06 ENCOUNTER — Other Ambulatory Visit: Payer: Self-pay

## 2024-10-07 ENCOUNTER — Other Ambulatory Visit: Payer: Self-pay | Admitting: Vascular Surgery

## 2024-10-07 DIAGNOSIS — I6522 Occlusion and stenosis of left carotid artery: Secondary | ICD-10-CM

## 2024-10-18 ENCOUNTER — Ambulatory Visit: Admitting: Family Medicine

## 2024-11-03 ENCOUNTER — Ambulatory Visit

## 2024-11-03 ENCOUNTER — Ambulatory Visit (HOSPITAL_COMMUNITY)
